# Patient Record
Sex: Female | Born: 1945 | Race: Asian | Hispanic: No | Smoking: Never smoker
Health system: Southern US, Community
[De-identification: ages and names within clinical notes are randomized; demographics above are authoritative.]

## PROBLEM LIST (undated history)

## (undated) DIAGNOSIS — M1990 Unspecified osteoarthritis, unspecified site: Secondary | ICD-10-CM

## (undated) DIAGNOSIS — I10 Essential (primary) hypertension: Secondary | ICD-10-CM

## (undated) DIAGNOSIS — A159 Respiratory tuberculosis unspecified: Secondary | ICD-10-CM

## (undated) DIAGNOSIS — E119 Type 2 diabetes mellitus without complications: Secondary | ICD-10-CM

## (undated) DIAGNOSIS — K219 Gastro-esophageal reflux disease without esophagitis: Secondary | ICD-10-CM

## (undated) HISTORY — PX: COLONOSCOPY: SHX174

## (undated) HISTORY — PX: ABDOMINAL HYSTERECTOMY: SHX81

## (undated) HISTORY — PX: NO PAST SURGERIES: SHX2092

---

## 2014-04-04 ENCOUNTER — Other Ambulatory Visit (INDEPENDENT_AMBULATORY_CARE_PROVIDER_SITE_OTHER): Payer: Self-pay | Admitting: Surgery

## 2014-04-25 ENCOUNTER — Encounter (HOSPITAL_COMMUNITY)
Admission: RE | Admit: 2014-04-25 | Discharge: 2014-04-25 | Disposition: A | Discharge status: Home / Self Care | Payer: Medicare Other | Source: Ambulatory Visit | Attending: Surgery | Admitting: Surgery

## 2014-04-25 ENCOUNTER — Encounter (HOSPITAL_COMMUNITY): Payer: Self-pay

## 2014-04-25 DIAGNOSIS — Z01818 Encounter for other preprocedural examination: Secondary | ICD-10-CM | POA: Diagnosis present

## 2014-04-25 HISTORY — DX: Essential (primary) hypertension: I10

## 2014-04-25 HISTORY — DX: Type 2 diabetes mellitus without complications: E11.9

## 2014-04-25 HISTORY — DX: Unspecified osteoarthritis, unspecified site: M19.90

## 2014-04-25 HISTORY — DX: Gastro-esophageal reflux disease without esophagitis: K21.9

## 2014-04-25 LAB — BASIC METABOLIC PANEL
ANION GAP: 13 (ref 5–15)
BUN: 19 mg/dL (ref 6–23)
CALCIUM: 9.7 mg/dL (ref 8.4–10.5)
CO2: 28 mEq/L (ref 19–32)
CREATININE: 0.61 mg/dL (ref 0.50–1.10)
Chloride: 99 mEq/L (ref 96–112)
Glucose, Bld: 148 mg/dL — ABNORMAL HIGH (ref 70–99)
Potassium: 4.4 mEq/L (ref 3.7–5.3)
SODIUM: 140 meq/L (ref 137–147)

## 2014-04-25 LAB — CBC
HEMATOCRIT: 33.6 % — AB (ref 36.0–46.0)
Hemoglobin: 10.8 g/dL — ABNORMAL LOW (ref 12.0–15.0)
MCH: 21.2 pg — ABNORMAL LOW (ref 26.0–34.0)
MCHC: 32.1 g/dL (ref 30.0–36.0)
MCV: 65.9 fL — ABNORMAL LOW (ref 78.0–100.0)
PLATELETS: 122 10*3/uL — AB (ref 150–400)
RBC: 5.1 MIL/uL (ref 3.87–5.11)
RDW: 15.5 % (ref 11.5–15.5)
WBC: 6.4 10*3/uL (ref 4.0–10.5)

## 2014-04-25 NOTE — Pre-Procedure Instructions (Addendum)
Olivia BuddMoihh Schranz  04/25/2014   Your procedure is scheduled on:  Wednesday, May 03, 2014 at 1:35 PM.   [facility] Ad[facility] Office at 11:30 AM.   Call this number if you have problems the morning of surgery: [phone]                Any questions prior to day of surgery, please call [phone] between 8 & 4 PM.   Remember:   Do not eat food or drink liquids after midnight.   Take these medicines the morning of surgery with A SIP OF WATER: pain med if needed Stop Aspirin, Nsaids, herbal meds, Vitamins,and herbal meds 5 days prior to surgery.  Do not wear jewelry, make-up or nail polish.  Do not wear lotions, powders, or perfumes. You may wear deodorant.  Do not shave 48 hours prior to surgery.   Do not bring valuables to the hospital.  [facility] not [facility]                  for any belongings or valuables.               Contacts, dentures or bridgework may not be worn into surgery.  Leave suitcase in the car. After surgery it may be brought to your room.  For patients admitted to the hospital, discharge time is determined by your                treatment team.               Patients discharged the day of surgery will not be allowed to drive home.    Special Instructions: [place] - Preparing for Surgery  Before surgery, you can play an important role.  Because skin is not sterile, your skin needs to be as free of germs as possible.  You can reduce the number of germs on you skin by washing with CHG (chlorahexidine gluconate) soap before surgery.  CHG is an antiseptic cleaner which kills germs and bonds with the skin to continue killing germs even after washing.  Please DO NOT use if you have an allergy to CHG or antibacterial soaps.  If your skin becomes reddened/irritated stop using the CHG and inform your nurse when you arrive at Short Stay.  Do not shave (including legs and underarms) for at least 48 hours prior to the first CHG shower.   You may shave your face.  Please follow these instructions carefully:   1.  Shower with CHG Soap the night before surgery and the                                morning of Surgery.  2.  If you choose to wash your hair, wash your hair first as usual with your       normal shampoo.  3.  After you shampoo, rinse your hair and body thoroughly to remove the                      Shampoo.  4.  Use CHG as you would any other liquid soap.  You can apply chg directly       to the skin and wash gently with scrungie or a clean washcloth.  5.  Apply the CHG Soap to your body ONLY FROM THE NECK DOWN.        Do  not use on open wounds or open sores.  Avoid contact with your eyes, ears, mouth and genitals (private parts).  Wash genitals (private parts) with your normal soap.  6.  Wash thoroughly, paying special attention to the area where your surgery        will be performed.  7.  Thoroughly rinse your body with warm water from the neck down.  8.  DO NOT shower/wash with your normal soap after using and rinsing off       the CHG Soap.  9.  Pat yourself dry with a clean towel.            10.  Wear clean pajamas.            11.  Place clean sheets on your bed the night of your first shower and do not        sleep with pets.  Day of Surgery  Do not apply any lotions the morning of surgery.  Please wear clean clothes to the hospital.     Please read over the following fact sheets that you were given: Pain Booklet, Coughing and Deep Breathing and Surgical Site Infection Prevention

## 2014-05-01 MED ORDER — CEFAZOLIN SODIUM-DEXTROSE 2-3 GM-% IV SOLR
2.0000 g | INTRAVENOUS | Status: AC
  Administered 2014-05-02: 2 g via INTRAVENOUS
  Filled 2014-05-01: qty 50

## 2014-05-01 NOTE — H&P (Signed)
Olivia Williamson 04/04/2014 3:08 PM Location: [facility] Patient #: [id number] DOB: [birth year] Widowed / Language: Undefined / Race: Undefined Female  History of Present Illness (Lawerence Dery A. Magnus IvanBlackman MD; 04/04/2014 3:25 PM) Patient words: gallstones.  The patient is a 68 year old female who presents for evaluation of gall stones. she is referred by Dr. Inda Cokeuong Nguyen in [place] for symptomatic cholelithiasis. She is accompanied by her granddaughter who is acting as her interpreter. She is [place] and speaks no [place]. she comes with an ultrasound from [place] dated July 2014 showing gallstones. She has been having intermittent abdominal pain in the epigastrium for some time. She now hurts daily. She has no nausea or vomiting. The history is poor secondary to language barrier   Other Problems Gilmer Mor(Sonya Bynum, CMA; 04/04/2014 3:09 PM) Cholelithiasis Diabetes Mellitus High blood pressure  Past Surgical History Gilmer Mor(Sonya Bynum, CMA; 04/04/2014 3:09 PM) No pertinent past surgical history  Diagnostic Studies History Gilmer Mor(Sonya Bynum, CMA; 04/04/2014 3:09 PM) Colonoscopy never Mammogram never Pap Smear never  Allergies Lamar Laundry(Sonya Bynum, CMA; 04/04/2014 3:08 PM) No Known Drug Allergies11/03/2014  Medication History (Sonya Bynum, CMA; 04/04/2014 3:11 PM) Nitrofurantoin Macrocrystal (100MG  Capsule, Oral) Active. MetFORMIN HCl (500MG  Tablet, Oral) Active. Gabapentin (300MG  Capsule, Oral) Active. GlyBURIDE (5MG  Tablet, Oral) Active.  Social History Gilmer Mor(Sonya Bynum, CMA; 04/04/2014 3:09 PM) Caffeine use Coffee. No alcohol use No drug use Tobacco use Never smoker.  Family History Gilmer Mor(Sonya Bynum, CMA; 04/04/2014 3:09 PM) Diabetes Mellitus Mother. Hypertension Mother.  Pregnancy / Birth History Gilmer Mor(Sonya Bynum, CMA; 04/04/2014 3:09 PM) Age at menarche 16 years. Age of menopause 7346-50 Gravida 1910 Maternal age 66-20 Para 2310  Review of Systems Lamar Laundry(Sonya Bynum CMA;  04/04/2014 3:09 PM) General Present- Appetite Loss, Fatigue, Fever and Weight Loss. Not Present- Chills, Night Sweats and Weight Gain. Skin Not Present- Change in Wart/Mole, Dryness, Hives, Jaundice, New Lesions, Non-Healing Wounds, Rash and Ulcer. HEENT Present- Hearing Loss and Seasonal Allergies. Not Present- Earache, Hoarseness, Nose Bleed, Oral Ulcers, Ringing in the Ears, Sinus Pain, Sore Throat, Visual Disturbances, Wears glasses/contact lenses and Yellow Eyes. Respiratory Not Present- Bloody sputum, Chronic Cough, Difficulty Breathing, Snoring and Wheezing. Breast Not Present- Breast Mass, Breast Pain, Nipple Discharge and Skin Changes. Cardiovascular Present- Leg Cramps. Not Present- Chest Pain, Difficulty Breathing Lying Down, Palpitations, Rapid Heart Rate, Shortness of Breath and Swelling of Extremities. Gastrointestinal Present- Abdominal Pain. Not Present- Bloating, Bloody Stool, Change in Bowel Habits, Chronic diarrhea, Constipation, Difficulty Swallowing, Excessive gas, Gets full quickly at meals, Hemorrhoids, Indigestion, Nausea, Rectal Pain and Vomiting. Female Genitourinary Present- Nocturia and Painful Urination. Not Present- Frequency, Pelvic Pain and Urgency. Musculoskeletal Not Present- Back Pain, Joint Pain, Joint Stiffness, Muscle Pain, Muscle Weakness and Swelling of Extremities. Neurological Present- Decreased Memory, Headaches and Weakness. Not Present- Fainting, Numbness, Seizures, Tingling, Tremor and Trouble walking. Psychiatric Present- Anxiety, Depression and Frequent crying. Not Present- Bipolar, Change in Sleep Pattern and Fearful. Endocrine Not Present- Cold Intolerance, Excessive Hunger, Hair Changes, Heat Intolerance, Hot flashes and New Diabetes. Hematology Not Present- Easy Bruising, Excessive bleeding, Gland problems, HIV and Persistent Infections.   Vitals (Sonya Bynum CMA; 04/04/2014 3:10 PM) 04/04/2014 3:09 PM Weight: 86 lb Height: 62in Body Surface  Area: 1.31 m Body Mass Index: 15.73 kg/m Temp.: 65F(Temporal)  Pulse: 76 (Regular)  BP: 124/70 (Sitting, Left Arm, Standard)    Physical Exam (Lataisha Colan A. Magnus IvanBlackman MD; 04/04/2014 3:25 PM) General Mental Status-Alert. General Appearance-Consistent with stated age. Hydration-Well hydrated. Voice-Normal.  Head and Neck Head-normocephalic, atraumatic with no  lesions or palpable masses.  Eye Eyeball - Bilateral-Extraocular movements intact. Sclera/Conjunctiva - Bilateral-No scleral icterus.  Chest and Lung Exam Chest and lung exam reveals -quiet, even and easy respiratory effort with no use of accessory muscles and on auscultation, normal breath sounds, no adventitious sounds and normal vocal resonance. Inspection Chest Wall - Normal. Back - normal.  Cardiovascular Cardiovascular examination reveals -on palpation PMI is normal in location and amplitude, no palpable S3 or S4. Normal cardiac borders., normal heart sounds, regular rate and rhythm with no murmurs, carotid auscultation reveals no bruits and normal pedal pulses bilaterally.  Abdomen Inspection Inspection of the abdomen reveals - No Hernias. Skin - Scar - no surgical scars. Palpation/Percussion Palpation and Percussion of the abdomen reveal - Soft, Non Tender, No Rebound tenderness, No Rigidity (guarding) and No hepatosplenomegaly. Auscultation Auscultation of the abdomen reveals - Bowel sounds normal.  Neurologic Neurologic evaluation reveals -alert and oriented x 3 with no impairment of recent or remote memory. Mental Status-Normal.  Musculoskeletal Normal Exam - Left-Upper Extremity Strength Normal and Lower Extremity Strength Normal. Normal Exam - Right-Upper Extremity Strength Normal, Lower Extremity Weakness.    Assessment & Plan (Mabry Santarelli A. Magnus IvanBlackman MD; 04/04/2014 3:26 PM) SYMPTOMATIC CHOLELITHIASIS (574.20  K80.20) Impression: laparoscopic cholecystectomy with  cholangiogram is recommended. I discussed the surgery with them and gave them literature regarding surgery. I discussed the risks with them. They wish to proceed Current Plans  Started Hydrocodone-Acetaminophen 5-325MG , 1 (one) Tablet every four hours, as needed, #30, 04/04/2014, No Refill.

## 2014-05-02 ENCOUNTER — Encounter (HOSPITAL_COMMUNITY): Payer: Self-pay | Admitting: *Deleted

## 2014-05-02 ENCOUNTER — Encounter (HOSPITAL_COMMUNITY)
Admission: RE | Disposition: A | Discharge status: Home / Self Care | Payer: Self-pay | Source: Ambulatory Visit | Attending: Surgery

## 2014-05-02 ENCOUNTER — Ambulatory Visit (HOSPITAL_COMMUNITY): Payer: Medicare Other | Admitting: Certified Registered Nurse Anesthetist

## 2014-05-02 ENCOUNTER — Ambulatory Visit (HOSPITAL_COMMUNITY)
Admission: RE | Admit: 2014-05-02 | Discharge: 2014-05-02 | Disposition: A | Discharge status: Home / Self Care | Payer: Medicare Other | Source: Ambulatory Visit | Attending: Surgery | Admitting: Surgery

## 2014-05-02 DIAGNOSIS — Z8249 Family history of ischemic heart disease and other diseases of the circulatory system: Secondary | ICD-10-CM | POA: Insufficient documentation

## 2014-05-02 DIAGNOSIS — K8020 Calculus of gallbladder without cholecystitis without obstruction: Secondary | ICD-10-CM | POA: Diagnosis present

## 2014-05-02 DIAGNOSIS — Z833 Family history of diabetes mellitus: Secondary | ICD-10-CM | POA: Insufficient documentation

## 2014-05-02 DIAGNOSIS — I10 Essential (primary) hypertension: Secondary | ICD-10-CM | POA: Insufficient documentation

## 2014-05-02 DIAGNOSIS — K8010 Calculus of gallbladder with chronic cholecystitis without obstruction: Secondary | ICD-10-CM | POA: Insufficient documentation

## 2014-05-02 DIAGNOSIS — E119 Type 2 diabetes mellitus without complications: Secondary | ICD-10-CM | POA: Insufficient documentation

## 2014-05-02 HISTORY — PX: CHOLECYSTECTOMY: SHX55

## 2014-05-02 LAB — GLUCOSE, CAPILLARY
GLUCOSE-CAPILLARY: 218 mg/dL — AB (ref 70–99)
GLUCOSE-CAPILLARY: 250 mg/dL — AB (ref 70–99)
GLUCOSE-CAPILLARY: 283 mg/dL — AB (ref 70–99)
GLUCOSE-CAPILLARY: 261 mg/dL — AB (ref 70–99)
Glucose-Capillary: 237 mg/dL — ABNORMAL HIGH (ref 70–99)

## 2014-05-02 SURGERY — LAPAROSCOPIC CHOLECYSTECTOMY WITH INTRAOPERATIVE CHOLANGIOGRAM
Anesthesia: General | Site: Abdomen

## 2014-05-02 MED ORDER — LACTATED RINGERS IV SOLN
INTRAVENOUS | Status: DC | PRN
  Administered 2014-05-02: 12:00:00 via INTRAVENOUS

## 2014-05-02 MED ORDER — INSULIN ASPART 100 UNIT/ML ~~LOC~~ SOLN
6.0000 [IU] | Freq: Once | SUBCUTANEOUS | Status: AC
  Administered 2014-05-02: 6 [IU] via INTRAVENOUS

## 2014-05-02 MED ORDER — MIDAZOLAM HCL 2 MG/2ML IJ SOLN
INTRAMUSCULAR | Status: AC
  Filled 2014-05-02: qty 2

## 2014-05-02 MED ORDER — INSULIN ASPART 100 UNIT/ML ~~LOC~~ SOLN
SUBCUTANEOUS | Status: AC
  Filled 2014-05-02: qty 6

## 2014-05-02 MED ORDER — HYDROCODONE-ACETAMINOPHEN 5-325 MG PO TABS
ORAL_TABLET | ORAL | Status: AC
  Filled 2014-05-02: qty 1

## 2014-05-02 MED ORDER — INSULIN ASPART 100 UNIT/ML ~~LOC~~ SOLN
8.0000 [IU] | Freq: Once | SUBCUTANEOUS | Status: AC
  Administered 2014-05-02: 8 [IU] via SUBCUTANEOUS

## 2014-05-02 MED ORDER — FENTANYL CITRATE 0.05 MG/ML IJ SOLN
INTRAMUSCULAR | Status: AC
  Filled 2014-05-02: qty 2

## 2014-05-02 MED ORDER — PROPOFOL 10 MG/ML IV BOLUS
INTRAVENOUS | Status: DC | PRN
  Administered 2014-05-02: 100 mg via INTRAVENOUS
  Administered 2014-05-02: 50 mg via INTRAVENOUS

## 2014-05-02 MED ORDER — SODIUM CHLORIDE 0.9 % IR SOLN
Status: DC | PRN
  Administered 2014-05-02: 1000 mL

## 2014-05-02 MED ORDER — BUPIVACAINE-EPINEPHRINE 0.25% -1:200000 IJ SOLN
INTRAMUSCULAR | Status: DC | PRN
  Administered 2014-05-02: 30 mL

## 2014-05-02 MED ORDER — MIDAZOLAM HCL 5 MG/5ML IJ SOLN
INTRAMUSCULAR | Status: DC | PRN
  Administered 2014-05-02: 1 mg via INTRAVENOUS

## 2014-05-02 MED ORDER — FENTANYL CITRATE 0.05 MG/ML IJ SOLN
INTRAMUSCULAR | Status: AC
  Filled 2014-05-02: qty 5

## 2014-05-02 MED ORDER — PROPOFOL 10 MG/ML IV BOLUS
INTRAVENOUS | Status: AC
  Filled 2014-05-02: qty 20

## 2014-05-02 MED ORDER — INSULIN ASPART 100 UNIT/ML ~~LOC~~ SOLN
SUBCUTANEOUS | Status: AC
  Filled 2014-05-02: qty 1

## 2014-05-02 MED ORDER — FENTANYL CITRATE 0.05 MG/ML IJ SOLN
25.0000 ug | INTRAMUSCULAR | Status: DC | PRN
  Administered 2014-05-02 (×5): 25 ug via INTRAVENOUS

## 2014-05-02 MED ORDER — OXYCODONE HCL 5 MG/5ML PO SOLN: 5.0000 mg | Freq: Once | ORAL | Status: DC | PRN

## 2014-05-02 MED ORDER — LIDOCAINE HCL (CARDIAC) 20 MG/ML IV SOLN
INTRAVENOUS | Status: AC
Start: 2014-05-02 — End: 2014-05-02
  Filled 2014-05-02: qty 5

## 2014-05-02 MED ORDER — 0.9 % SODIUM CHLORIDE (POUR BTL) OPTIME
TOPICAL | Status: DC | PRN
  Administered 2014-05-02: 1000 mL

## 2014-05-02 MED ORDER — HYDROCODONE-ACETAMINOPHEN 5-325 MG PO TABS
1.0000 | ORAL_TABLET | Freq: Once | ORAL | Status: AC
  Administered 2014-05-02: 1 via ORAL

## 2014-05-02 MED ORDER — FENTANYL CITRATE 0.05 MG/ML IJ SOLN
INTRAMUSCULAR | Status: DC | PRN
  Administered 2014-05-02 (×2): 50 ug via INTRAVENOUS

## 2014-05-02 MED ORDER — LIDOCAINE HCL (CARDIAC) 20 MG/ML IV SOLN
INTRAVENOUS | Status: DC | PRN
  Administered 2014-05-02: 50 mg via INTRAVENOUS

## 2014-05-02 MED ORDER — OXYCODONE HCL 5 MG PO TABS: 5.0000 mg | ORAL_TABLET | Freq: Once | ORAL | Status: DC | PRN

## 2014-05-02 MED ORDER — HYDROCODONE-ACETAMINOPHEN 5-325 MG PO TABS: 1.0000 | ORAL_TABLET | ORAL | Status: DC | PRN

## 2014-05-02 MED ORDER — ONDANSETRON HCL 4 MG/2ML IJ SOLN
INTRAMUSCULAR | Status: DC | PRN
  Administered 2014-05-02: 4 mg via INTRAVENOUS

## 2014-05-02 MED ORDER — SUCCINYLCHOLINE CHLORIDE 20 MG/ML IJ SOLN
INTRAMUSCULAR | Status: DC | PRN
  Administered 2014-05-02: 100 mg via INTRAVENOUS

## 2014-05-02 SURGICAL SUPPLY — 39 items
APPLIER CLIP 5 13 M/L LIGAMAX5 (MISCELLANEOUS) ×3
BANDAGE ADH SHEER 1  50/CT (GAUZE/BANDAGES/DRESSINGS) ×3 IMPLANT
BENZOIN TINCTURE PRP APPL 2/3 (GAUZE/BANDAGES/DRESSINGS) ×3 IMPLANT
BLADE SURG CLIPPER 3M 9600 (MISCELLANEOUS) IMPLANT
CANISTER SUCTION 2500CC (MISCELLANEOUS) ×3 IMPLANT
CHLORAPREP W/TINT 26ML (MISCELLANEOUS) ×3 IMPLANT
CLIP APPLIE 5 13 M/L LIGAMAX5 (MISCELLANEOUS) ×1 IMPLANT
CLOSURE WOUND 1/2 X4 (GAUZE/BANDAGES/DRESSINGS) ×1
COVER MAYO STAND STRL (DRAPES) IMPLANT
COVER SURGICAL LIGHT HANDLE (MISCELLANEOUS) ×3 IMPLANT
DRAPE LAPAROSCOPIC ABDOMINAL (DRAPES) ×3 IMPLANT
ELECT REM PT RETURN 9FT ADLT (ELECTROSURGICAL) ×3
ELECTRODE REM PT RTRN 9FT ADLT (ELECTROSURGICAL) ×1 IMPLANT
GLOVE BIOGEL PI IND STRL 7.0 (GLOVE) ×1 IMPLANT
GLOVE BIOGEL PI IND STRL 7.5 (GLOVE) ×1 IMPLANT
GLOVE BIOGEL PI INDICATOR 7.0 (GLOVE) ×2
GLOVE BIOGEL PI INDICATOR 7.5 (GLOVE) ×2
GLOVE SURG SIGNA 7.5 PF LTX (GLOVE) ×3 IMPLANT
GLOVE SURG SS PI 7.0 STRL IVOR (GLOVE) ×6 IMPLANT
GOWN STRL REUS W/ TWL LRG LVL3 (GOWN DISPOSABLE) ×2 IMPLANT
GOWN STRL REUS W/ TWL XL LVL3 (GOWN DISPOSABLE) ×1 IMPLANT
GOWN STRL REUS W/TWL LRG LVL3 (GOWN DISPOSABLE) ×4
GOWN STRL REUS W/TWL XL LVL3 (GOWN DISPOSABLE) ×2
KIT BASIN OR (CUSTOM PROCEDURE TRAY) ×3 IMPLANT
KIT ROOM TURNOVER OR (KITS) ×3 IMPLANT
NS IRRIG 1000ML POUR BTL (IV SOLUTION) ×3 IMPLANT
PAD ARMBOARD 7.5X6 YLW CONV (MISCELLANEOUS) ×3 IMPLANT
POUCH SPECIMEN RETRIEVAL 10MM (ENDOMECHANICALS) ×3 IMPLANT
SCISSORS LAP 5X35 DISP (ENDOMECHANICALS) ×3 IMPLANT
SET IRRIG TUBING LAPAROSCOPIC (IRRIGATION / IRRIGATOR) ×3 IMPLANT
SLEEVE ENDOPATH XCEL 5M (ENDOMECHANICALS) ×6 IMPLANT
SPECIMEN JAR SMALL (MISCELLANEOUS) ×3 IMPLANT
STRIP CLOSURE SKIN 1/2X4 (GAUZE/BANDAGES/DRESSINGS) ×2 IMPLANT
SUT MON AB 4-0 PC3 18 (SUTURE) ×3 IMPLANT
TOWEL OR 17X24 6PK STRL BLUE (TOWEL DISPOSABLE) ×3 IMPLANT
TRAY LAPAROSCOPIC (CUSTOM PROCEDURE TRAY) ×3 IMPLANT
TROCAR XCEL BLUNT TIP 100MML (ENDOMECHANICALS) ×3 IMPLANT
TROCAR XCEL NON-BLD 5MMX100MML (ENDOMECHANICALS) ×3 IMPLANT
TUBING INSUFFLATION (TUBING) ×3 IMPLANT

## 2014-05-02 NOTE — Anesthesia Procedure Notes (Signed)
Procedure Name: Intubation Date/Time: 05/02/2014 12:44 PM Performed by: Caren MacadamARTER, Steed Kanaan W Pre-anesthesia Checklist: Patient identified, Emergency Drugs available, Suction available and Patient being monitored Patient Re-evaluated:Patient Re-evaluated prior to inductionOxygen Delivery Method: Circle System Utilized Preoxygenation: Pre-oxygenation with 100% oxygen Intubation Type: IV induction Ventilation: Mask ventilation without difficulty Laryngoscope Size: Miller and 2 Tube type: Oral Tube size: 7.0 mm Number of attempts: 1 Airway Equipment and Method: stylet and oral airway Placement Confirmation: ETT inserted through vocal cords under direct vision,  positive ETCO2 and breath sounds checked- equal and bilateral Secured at: 21 cm Tube secured with: Tape Dental Injury: Teeth and Oropharynx as per pre-operative assessment

## 2014-05-02 NOTE — Interval H&P Note (Signed)
History and Physical Interval Note: no change in H and P  05/02/2014 11:54 AM  Olivia Williamson  has presented today for surgery, with the diagnosis of Cholelithiasis  The various methods of treatment have been discussed with the patient and family. After consideration of risks, benefits and other options for treatment, the patient has consented to  Procedure(s): LAPAROSCOPIC CHOLECYSTECTOMY WITH INTRAOPERATIVE CHOLANGIOGRAM (N/A) as a surgical intervention .  The patient's history has been reviewed, patient examined, no change in status, stable for surgery.  I have reviewed the patient's chart and labs.  Questions were answered to the patient's satisfaction.     Hoa Deriso A

## 2014-05-02 NOTE — Op Note (Signed)
Laparoscopic Cholecystectomy Procedure Note  Indications: This patient presents with symptomatic gallbladder disease and will undergo laparoscopic cholecystectomy.  Pre-operative Diagnosis: Calculus of gallbladder without mention of cholecystitis or obstruction  Post-operative Diagnosis: Same  Surgeon: Abigail MiyamotoBLACKMAN,Brianna Bennett A   Assistants: 0  Anesthesia: General endotracheal anesthesia  ASA Class: 2  Procedure Details  The patient was seen again in the Holding Room. The risks, benefits, complications, treatment options, and expected outcomes were discussed with the patient. The possibilities of reaction to medication, pulmonary aspiration, perforation of viscus, bleeding, recurrent infection, finding a normal gallbladder, the need for additional procedures, failure to diagnose a condition, the possible need to convert to an open procedure, and creating a complication requiring transfusion or operation were discussed with the patient. The likelihood of improving the patient's symptoms with return to their baseline status is good.  The patient and/or family concurred with the proposed plan, giving informed consent. The site of surgery properly noted. The patient was taken to Operating Room, identified as Olivia Williamson and the procedure verified as Laparoscopic Cholecystectomy with Intraoperative Cholangiogram. A Time Out was held and the above information confirmed.  Prior to the induction of general anesthesia, antibiotic prophylaxis was administered. General endotracheal anesthesia was then administered and tolerated well. After the induction, the abdomen was prepped with Chloraprep and draped in sterile fashion. The patient was positioned in the supine position.  Local anesthetic agent was injected into the skin near the umbilicus and an incision made. We dissected down to the abdominal fascia with blunt dissection.  The fascia was incised vertically and we entered the peritoneal cavity bluntly.  A  pursestring suture of 0-Vicryl was placed around the fascial opening.  The Hasson cannula was inserted and secured with the stay suture.  Pneumoperitoneum was then created with CO2 and tolerated well without any adverse changes in the patient's vital signs. An 11-mm port was placed in the subxiphoid position.  Two 5-mm ports were placed in the right upper quadrant. All skin incisions were infiltrated with a local anesthetic agent before making the incision and placing the trocars.   We positioned the patient in reverse Trendelenburg, tilted slightly to the patient's left.  The gallbladder was identified, the fundus grasped and retracted cephalad. Adhesions were lysed bluntly and with the electrocautery where indicated, taking care not to injure any adjacent organs or viscus. The infundibulum was grasped and retracted laterally, exposing the peritoneum overlying the triangle of Calot. This was then divided and exposed in a blunt fashion. The cystic duct was clearly identified and bluntly dissected circumferentially. A critical view of the cystic duct and cystic artery was obtained.  The cystic duct was then ligated with clips and divided. The cystic artery was, dissected free, ligated with clips and divided as well.   The gallbladder was dissected from the liver bed in retrograde fashion with the electrocautery. The gallbladder was removed and placed in an Endocatch sac. The liver bed was irrigated and inspected. Hemostasis was achieved with the electrocautery. Copious irrigation was utilized and was repeatedly aspirated until clear.  The gallbladder and Endocatch sac were then removed through the umbilical port site.  The pursestring suture was used to close the umbilical fascia.    We again inspected the right upper quadrant for hemostasis.  Pneumoperitoneum was released as we removed the trocars.  4-0 Monocryl was used to close the skin.   Benzoin, steri-strips, and clean dressings were applied. The patient  was then extubated and brought to the recovery room in  stable condition. Instrument, sponge, and needle counts were correct at closure and at the conclusion of the case.   Findings: Cholecystitis with Cholelithiasis  Estimated Blood Loss: Minimal         Drains: 0         Specimens: Gallbladder           Complications: None; patient tolerated the procedure well.         Disposition: PACU - hemodynamically stable.         Condition: stable

## 2014-05-02 NOTE — Anesthesia Postprocedure Evaluation (Signed)
  Anesthesia Post-op Note  Patient: Olivia Williamson  Procedure(s) Performed: Procedure(s) with comments: LAPAROSCOPIC CHOLECYSTECTOMY  (N/A) - laparoscopic cholecystectomy  Patient Location: PACU  Anesthesia Type:General  Level of Consciousness: awake and alert   Airway and Oxygen Therapy: Patient Spontanous Breathing  Post-op Pain: mild  Post-op Assessment: Post-op Vital signs reviewed and Patient's Cardiovascular Status Stable  Post-op Vital Signs: Reviewed  Last Vitals:  Filed Vitals:   05/02/14 1350  BP: 139/53  Pulse: 77  Temp:   Resp: 16    Complications: No apparent anesthesia complications

## 2014-05-02 NOTE — Anesthesia Preprocedure Evaluation (Addendum)
Anesthesia Evaluation  Patient identified by MRN, date of birth, ID band Patient awake    Reviewed: Allergy & Precautions, H&P , NPO status , reviewed documented beta blocker date and time   Airway Mallampati: II   Neck ROM: Full    Dental  (+) Teeth Intact   Pulmonary  breath sounds clear to auscultation        Cardiovascular hypertension, Pt. on medications Rhythm:Regular     Neuro/Psych    GI/Hepatic   Endo/Other  diabetes, Type 2  Renal/GU      Musculoskeletal   Abdominal (+)  Abdomen: soft.    Peds  Hematology   Anesthesia Other Findings   Reproductive/Obstetrics                           Anesthesia Physical Anesthesia Plan  ASA: II  Anesthesia Plan: General   Post-op Pain Management:    Induction: Intravenous  Airway Management Planned: Oral ETT  Additional Equipment:   Intra-op Plan:   Post-operative Plan: Extubation in OR  Informed Consent: I have reviewed the patients History and Physical, chart, labs and discussed the procedure including the risks, benefits and alternatives for the proposed anesthesia with the patient or authorized representative who has indicated his/her understanding and acceptance.     Plan Discussed with:   Anesthesia Plan Comments: (Used translator for hx)        Anesthesia Quick Evaluation

## 2014-05-02 NOTE — Transfer of Care (Signed)
Immediate Anesthesia Transfer of Care Note  Patient: Olivia Williamson  Procedure(s) Performed: Procedure(s) with comments: LAPAROSCOPIC CHOLECYSTECTOMY  (N/A) - laparoscopic cholecystectomy  Patient Location: PACU  Anesthesia Type:General  Level of Consciousness: awake, alert  and patient cooperative  Airway & Oxygen Therapy: Patient Spontanous Breathing and Patient connected to nasal cannula oxygen  Post-op Assessment: Report given to PACU RN, Post -op Vital signs reviewed and stable and Patient moving all extremities X 4  Post vital signs: Reviewed and stable  Complications: Dr. Berneice HeinrichManny to bedside to assess loose tooth.

## 2014-05-02 NOTE — Discharge Instructions (Signed)
[facility] ______[facility] °LAPAROSCOPIC SURGERY: POST OP INSTRUCTIONS °Always review your discharge instruction sheet given to you by the facility where your surgery was performed. °IF YOU HAVE DISABILITY OR FAMILY LEAVE FORMS, YOU MUST BRING THEM TO THE OFFICE FOR PROCESSING.   °DO NOT GIVE THEM TO YOUR DOCTOR. ° °1. A prescription for pain medication may be given to you upon discharge.  Take your pain medication as prescribed, if needed.  If narcotic pain medicine is not needed, then you may take acetaminophen (Tylenol) or ibuprofen (Advil) as needed. °2. Take your usually prescribed medications unless otherwise directed. °3. If you need a refill on your pain medication, please contact your pharmacy.  They will contact our office to request authorization. Prescriptions will not be filled after 5pm or on week-ends. °4. You should follow a light diet the first few days after arrival home, such as soup and crackers, etc.  Be sure to include lots of fluids daily. °5. Most patients will experience some swelling and bruising in the area of the incisions.  Ice packs will help.  Swelling and bruising can take several days to resolve.  °6. It is common to experience some constipation if taking pain medication after surgery.  Increasing fluid intake and taking a stool softener (such as Colace) will usually help or prevent this problem from occurring.  A mild laxative (Milk of Magnesia or Miralax) should be taken according to package instructions if there are no bowel movements after 48 hours. °7. Unless discharge instructions indicate otherwise, you may remove your bandages 24-48 hours after surgery, and you may shower at that time.  You may have steri-strips (small skin tapes) in place directly over the incision.  These strips should be left on the skin for 7-10 days.  If your surgeon used skin glue on the incision, you may shower in 24 hours.  The glue will flake off over the next 2-3 weeks.  Any sutures or  staples will be removed at the office during your follow-up visit. °8. ACTIVITIES:  You may resume regular (light) daily activities beginning the next day--such as daily self-care, walking, climbing stairs--gradually increasing activities as tolerated.  You may have sexual intercourse when it is comfortable.  Refrain from any heavy lifting or straining until approved by your doctor. °a. You may drive when you are no longer taking prescription pain medication, you can comfortably wear a seatbelt, and you can safely maneuver your car and apply brakes. °b. RETURN TO WORK:  __________________________________________________________ °9. You should see your doctor in the office for a follow-up appointment approximately 2-3 weeks after your surgery.  Make sure that you call for this appointment within a day or two after you arrive home to insure a convenient appointment time. °10. OTHER INSTRUCTIONS: __________________________________________________________________________________________________________________________ __________________________________________________________________________________________________________________________ °WHEN TO CALL YOUR DOCTOR: °1. Fever over 101.0 °2. Inability to urinate °3. Continued bleeding from incision. °4. Increased pain, redness, or drainage from the incision. °5. Increasing abdominal pain ° °The clinic staff is available to answer your questions during regular business hours.  Please don’t hesitate to call and ask to speak to one of the nurses for clinical concerns.  If you have a medical emergency, go to the nearest emergency room or call 911.  A surgeon from [facility] is always on call at the hospital. °[address], [address], [place], [place]  [address] ? [address] [address], [place], [place]   [address] °[phone] ? [phone] ? FAX [phone] °Web site:   [email or web address] ° °What to eat: ° °For your first meals, you should eat  lightly; only small meals initially.  If you do not have nausea, you may eat larger meals.  Avoid spicy, greasy and heavy food.   ° °General Anesthesia, Adult, Care After  °Refer to this sheet in the next few weeks. These instructions provide you with information on caring for yourself after your procedure. Your health care provider may also give you more specific instructions. Your treatment has been planned according to current medical practices, but problems sometimes occur. Call your health care provider if you have any problems or questions after your procedure.  °WHAT TO EXPECT AFTER THE PROCEDURE  °After the procedure, it is typical to experience:  °Sleepiness.  °Nausea and vomiting. °HOME CARE INSTRUCTIONS  °For the first 24 hours after general anesthesia:  °Have a responsible person with you.  °Do not drive a car. If you are alone, do not take public transportation.  °Do not drink alcohol.  °Do not take medicine that has not been prescribed by your health care provider.  °Do not sign important papers or make important decisions.  °You may resume a normal diet and activities as directed by your health care provider.  °Change bandages (dressings) as directed.  °If you have questions or problems that seem related to general anesthesia, call the hospital and ask for the anesthetist or anesthesiologist on call. °SEEK MEDICAL CARE IF:  °You have nausea and vomiting that continue the day after anesthesia.  °You develop a rash. °SEEK IMMEDIATE MEDICAL CARE IF:  °You have difficulty breathing.  °You have chest pain.  °You have any allergic problems. °Document Released: 08/17/2000 Document Revised: 01/11/2013 Document Reviewed: 11/24/2012  °ExitCare® Patient Information ©2014 [employer].  ° ° °

## 2014-05-03 ENCOUNTER — Encounter (HOSPITAL_COMMUNITY): Payer: Self-pay | Admitting: Emergency Medicine

## 2014-05-03 ENCOUNTER — Emergency Department (HOSPITAL_COMMUNITY)
Admission: EM | Admit: 2014-05-03 | Discharge: 2014-05-04 | Disposition: A | Discharge status: Home / Self Care | Payer: Medicare Other | Attending: Emergency Medicine | Admitting: Emergency Medicine

## 2014-05-03 DIAGNOSIS — D72829 Elevated white blood cell count, unspecified: Secondary | ICD-10-CM | POA: Diagnosis not present

## 2014-05-03 DIAGNOSIS — R630 Anorexia: Secondary | ICD-10-CM | POA: Insufficient documentation

## 2014-05-03 DIAGNOSIS — Z9889 Other specified postprocedural states: Secondary | ICD-10-CM | POA: Insufficient documentation

## 2014-05-03 DIAGNOSIS — G8918 Other acute postprocedural pain: Secondary | ICD-10-CM | POA: Diagnosis not present

## 2014-05-03 DIAGNOSIS — I10 Essential (primary) hypertension: Secondary | ICD-10-CM | POA: Diagnosis not present

## 2014-05-03 DIAGNOSIS — Z9049 Acquired absence of other specified parts of digestive tract: Secondary | ICD-10-CM | POA: Insufficient documentation

## 2014-05-03 DIAGNOSIS — R7401 Elevation of levels of liver transaminase levels: Secondary | ICD-10-CM

## 2014-05-03 DIAGNOSIS — M1990 Unspecified osteoarthritis, unspecified site: Secondary | ICD-10-CM | POA: Diagnosis not present

## 2014-05-03 DIAGNOSIS — R112 Nausea with vomiting, unspecified: Secondary | ICD-10-CM | POA: Diagnosis not present

## 2014-05-03 DIAGNOSIS — E119 Type 2 diabetes mellitus without complications: Secondary | ICD-10-CM | POA: Diagnosis not present

## 2014-05-03 DIAGNOSIS — D696 Thrombocytopenia, unspecified: Secondary | ICD-10-CM | POA: Insufficient documentation

## 2014-05-03 DIAGNOSIS — R740 Nonspecific elevation of levels of transaminase and lactic acid dehydrogenase [LDH]: Secondary | ICD-10-CM

## 2014-05-03 DIAGNOSIS — R1011 Right upper quadrant pain: Secondary | ICD-10-CM | POA: Diagnosis present

## 2014-05-03 DIAGNOSIS — Z8719 Personal history of other diseases of the digestive system: Secondary | ICD-10-CM | POA: Diagnosis not present

## 2014-05-03 LAB — COMPREHENSIVE METABOLIC PANEL
ALT: 66 U/L — ABNORMAL HIGH (ref 0–35)
AST: 48 U/L — ABNORMAL HIGH (ref 0–37)
Albumin: 3.1 g/dL — ABNORMAL LOW (ref 3.5–5.2)
Alkaline Phosphatase: 68 U/L (ref 39–117)
Anion gap: 12 (ref 5–15)
BUN: 33 mg/dL — ABNORMAL HIGH (ref 6–23)
CO2: 27 mEq/L (ref 19–32)
Calcium: 9.1 mg/dL (ref 8.4–10.5)
Chloride: 93 mEq/L — ABNORMAL LOW (ref 96–112)
Creatinine, Ser: 0.72 mg/dL (ref 0.50–1.10)
GFR calc Af Amer: >90 mL/min (ref >90)
GFR calc non Af Amer: 88 mL/min — ABNORMAL LOW (ref >90)
Glucose, Bld: 262 mg/dL — ABNORMAL HIGH (ref 70–99)
Potassium: 4 mEq/L (ref 3.7–5.3)
Sodium: 132 mEq/L — ABNORMAL LOW (ref 137–147)
Total Bilirubin: 1.3 mg/dL — ABNORMAL HIGH (ref 0.3–1.2)
Total Protein: 6.8 g/dL (ref 6.0–8.3)

## 2014-05-03 LAB — LIPASE, BLOOD: Lipase: 13 U/L (ref 11–59)

## 2014-05-03 MED ORDER — ONDANSETRON HCL 4 MG/2ML IJ SOLN
4.0000 mg | Freq: Once | INTRAMUSCULAR | Status: AC
  Administered 2014-05-03: 4 mg via INTRAVENOUS
  Filled 2014-05-03: qty 2

## 2014-05-03 MED ORDER — HYDROMORPHONE HCL 1 MG/ML IJ SOLN
1.0000 mg | Freq: Once | INTRAMUSCULAR | Status: AC
  Administered 2014-05-03: 1 mg via INTRAVENOUS
  Filled 2014-05-03: qty 1

## 2014-05-03 MED ORDER — SODIUM CHLORIDE 0.9 % IV BOLUS (SEPSIS)
1000.0000 mL | Freq: Once | INTRAVENOUS | Status: AC
  Administered 2014-05-03: 1000 mL via INTRAVENOUS

## 2014-05-03 NOTE — ED Notes (Signed)
Patient presents with RUQ abdominal pain. Patient had laproscopic surgery yesterday for gallstones. Patient is guarding her RUQ. Patient reports nausea and emesis. Language barrier, patient speaks vietnamese. BP 127/86 HR 91 RR 28

## 2014-05-03 NOTE — ED Provider Notes (Signed)
CSN: [id number]     Arrival date & time 05/03/14  2229 History   First MD Initiated Contact with Patient 05/03/14 2303     Chief Complaint  Patient presents with  . Abdominal Pain     (Consider location/radiation/quality/duration/timing/severity/associated sxs/prior Treatment) HPI Pt is a 68yo female presenting to ED 1 day s/p laparoscopic surgery for cholecystomy by Dr. Magnus IvanBlackman, c/o severe diffuse abdominal pain that worsened this morning, unrelieved by pain norco provided to pt. Pt is vietnamese, non-English speaking, hx provided by family members.  Per family, pt had about 6 episodes of vomiting today, unable to keep down fluids.  Pain is constant worse in upper abdomen, worse with movement and in certain positions. Pt has c/o hot and cold chills, subjective fever.  Family is wondering if pt will be admitted for her pain.    Past Medical History  Diagnosis Date  . Hypertension     meds stopped 1 week ago , need to get renewed  . GERD (gastroesophageal reflux disease)   . Diabetes mellitus without complication     stopped DM meds 2004  . Arthritis    Past Surgical History  Procedure Laterality Date  . Colonoscopy    . No past surgeries     No family history on file. History  Substance Use Topics  . Smoking status: Never Smoker   . Smokeless tobacco: Never Used  . Alcohol Use: No   OB History    No data available     Review of Systems  Constitutional: Positive for fever ( subjective), chills and appetite change.  Gastrointestinal: Positive for nausea, vomiting and abdominal pain. Negative for diarrhea, constipation and blood in stool.  All other systems reviewed and are negative.     Allergies  Review of patient's allergies indicates no known allergies.  Home Medications   Prior to Admission medications   Medication Sig Start Date End Date Taking? Authorizing Provider  HYDROcodone-acetaminophen (NORCO/VICODIN) 5-325 MG per tablet Take 1 tablet by mouth every 4  (four) hours as needed (pain). 05/02/14  Yes Abigail Miyamotoouglas Blackman, MD   BP 120/63 mmHg  Pulse 93  Temp(Src) 98.1 F (36.7 C) (Oral)  Resp 24  SpO2 95% Physical Exam  Constitutional: She appears well-developed and well-nourished. She appears distressed.  Pt lying in exam bed moaning, holding RUQ. Appears in pain.  HENT:  Head: Normocephalic and atraumatic.  Eyes: Conjunctivae are normal. No scleral icterus.  Neck: Normal range of motion.  Cardiovascular: Normal rate, regular rhythm and normal heart sounds.   Pulmonary/Chest: Effort normal and breath sounds normal. No respiratory distress. She has no wheezes. She has no rales. She exhibits no tenderness.  Abdominal: Bowel sounds are normal. She exhibits no distension and no mass. There is tenderness. There is guarding. There is no rebound.  Multiple surgical incisions with dry clean bandages. no erythema or discharge. Abdomen is rigid but non-distended. Diffuse tenderness with guarding, worse in RUQ.    Musculoskeletal: Normal range of motion.  Neurological: She is alert.  Skin: Skin is warm and dry. She is not diaphoretic.  Nursing note and vitals reviewed.   ED Course  Procedures (including critical care time) Labs Review Labs Reviewed  CBC WITH DIFFERENTIAL - Abnormal; Notable for the following:    WBC 18.6 (*)    Hemoglobin 10.2 (*)    HCT 30.4 (*)    MCV 64.7 (*)    MCH 21.7 (*)    Platelets 95 (*)    Neutrophils Relative %  93 (*)    Lymphocytes Relative 4 (*)    Neutro Abs 17.3 (*)    All other components within normal limits  COMPREHENSIVE METABOLIC PANEL - Abnormal; Notable for the following:    Sodium 132 (*)    Chloride 93 (*)    Glucose, Bld 262 (*)    BUN 33 (*)    Albumin 3.1 (*)    AST 48 (*)    ALT 66 (*)    Total Bilirubin 1.3 (*)    GFR calc non Af Amer 88 (*)    All other components within normal limits  LIPASE, BLOOD    Imaging Review No results found.   EKG Interpretation None      MDM    Final diagnoses:  Post-op pain    Pt is a 67yo female 1 day s/p laparoscopic cholecystectomy.  Pt is significantly tender on exam. Reports nausea and vomiting.  Pt is afebrile.  Leukocytosis noted on labs, unknown significance due to s/p 1 day post-op. Discussed pt with Dr. Preston FleetingGlick who also examined, pt. Will get CT abdomen, concern for surgical complication.   Pt was given 1mg  IV dilaudid, zofran, and IV fluids.    Pt signed out to Dr. Preston FleetingGlick at shift change. Plan is to f/u on CT abd, consult with general surgery as needed to help determine disposition and tx plan.     Junius FinnerErin O'Malley, PA-C 05/04/14 0104

## 2014-05-04 ENCOUNTER — Emergency Department (HOSPITAL_COMMUNITY): Payer: Medicare Other

## 2014-05-04 ENCOUNTER — Encounter (HOSPITAL_COMMUNITY): Payer: Self-pay | Admitting: Radiology

## 2014-05-04 DIAGNOSIS — R1011 Right upper quadrant pain: Secondary | ICD-10-CM | POA: Diagnosis not present

## 2014-05-04 LAB — CBC WITH DIFFERENTIAL/PLATELET
Basophils Absolute: 0 10*3/uL (ref 0.0–0.1)
Basophils Relative: 0 % (ref 0–1)
Eosinophils Absolute: 0 10*3/uL (ref 0.0–0.7)
Eosinophils Relative: 0 % (ref 0–5)
HCT: 30.4 % — ABNORMAL LOW (ref 36.0–46.0)
Hemoglobin: 10.2 g/dL — ABNORMAL LOW (ref 12.0–15.0)
Lymphocytes Relative: 4 % — ABNORMAL LOW (ref 12–46)
Lymphs Abs: 0.7 10*3/uL (ref 0.7–4.0)
MCH: 21.7 pg — ABNORMAL LOW (ref 26.0–34.0)
MCHC: 33.6 g/dL (ref 30.0–36.0)
MCV: 64.7 fL — ABNORMAL LOW (ref 78.0–100.0)
Monocytes Absolute: 0.6 10*3/uL (ref 0.1–1.0)
Monocytes Relative: 3 % (ref 3–12)
Neutro Abs: 17.3 10*3/uL — ABNORMAL HIGH (ref 1.7–7.7)
Neutrophils Relative %: 93 % — ABNORMAL HIGH (ref 43–77)
Platelets: 95 10*3/uL — ABNORMAL LOW (ref 150–400)
RBC: 4.7 MIL/uL (ref 3.87–5.11)
RDW: 15.3 % (ref 11.5–15.5)
WBC: 18.6 10*3/uL — ABNORMAL HIGH (ref 4.0–10.5)

## 2014-05-04 MED ORDER — IOHEXOL 300 MG/ML  SOLN
70.0000 mL | Freq: Once | INTRAMUSCULAR | Status: AC | PRN
  Administered 2014-05-04: 70 mL via INTRAVENOUS

## 2014-05-04 MED ORDER — IOHEXOL 300 MG/ML  SOLN
25.0000 mL | Freq: Once | INTRAMUSCULAR | Status: AC | PRN
  Administered 2014-05-04: 25 mL via ORAL

## 2014-05-04 NOTE — ED Provider Notes (Signed)
68 year old female had left the scopic cholecystectomy done yesterday. She had some vomiting earlier today. This evening, she had sudden onset of severe mid and upper abdominal pain. On exam, she is resting comfortably but does have significant tenderness in the upper abdomen which is poorly localized. Bowel sounds are decreased. She is noted to have a leukocytosis but this is of uncertain significance since she is 1 day postop. Also noted is thrombocytopenia which is not clinically significant, and mild elevation of transaminases which is also probably not clinically significant. She is sent for CT scan.  CT is unremarkable. Small amount of free air in the abdomen is expected in the postop setting. Case has been discussed with Dr. Andrey CampanileWilson of general surgery who has come to evaluate the patient. I reexamined her and found abdominal tenderness was markedly decreased from what it had been earlier. This was confirmed by Dr. Andrey CampanileWilson who felt that she was safe to discharge. She is to keep her follow-up appointment with Dr. Magnus IvanBlackman.  Results for orders placed or performed during the hospital encounter of 05/03/14  CBC with Differential  Result Value Ref Range   WBC 18.6 (H) 4.0 - 10.5 K/uL   RBC 4.70 3.87 - 5.11 MIL/uL   Hemoglobin 10.2 (L) 12.0 - 15.0 g/dL   HCT 16.130.4 (L) 09.636.0 - 04.546.0 %   MCV 64.7 (L) 78.0 - 100.0 fL   MCH 21.7 (L) 26.0 - 34.0 pg   MCHC 33.6 30.0 - 36.0 g/dL   RDW 40.915.3 81.111.5 - 91.415.5 %   Platelets 95 (L) 150 - 400 K/uL   Neutrophils Relative % 93 (H) 43 - 77 %   Lymphocytes Relative 4 (L) 12 - 46 %   Monocytes Relative 3 3 - 12 %   Eosinophils Relative 0 0 - 5 %   Basophils Relative 0 0 - 1 %   Neutro Abs 17.3 (H) 1.7 - 7.7 K/uL   Lymphs Abs 0.7 0.7 - 4.0 K/uL   Monocytes Absolute 0.6 0.1 - 1.0 K/uL   Eosinophils Absolute 0.0 0.0 - 0.7 K/uL   Basophils Absolute 0.0 0.0 - 0.1 K/uL   RBC Morphology TARGET CELLS   Comprehensive metabolic panel  Result Value Ref Range   Sodium 132 (L)  137 - 147 mEq/L   Potassium 4.0 3.7 - 5.3 mEq/L   Chloride 93 (L) 96 - 112 mEq/L   CO2 27 19 - 32 mEq/L   Glucose, Bld 262 (H) 70 - 99 mg/dL   BUN 33 (H) 6 - 23 mg/dL   Creatinine, Ser 7.820.72 0.50 - 1.10 mg/dL   Calcium 9.1 8.4 - 95.610.5 mg/dL   Total Protein 6.8 6.0 - 8.3 g/dL   Albumin 3.1 (L) 3.5 - 5.2 g/dL   AST 48 (H) 0 - 37 U/L   ALT 66 (H) 0 - 35 U/L   Alkaline Phosphatase 68 39 - 117 U/L   Total Bilirubin 1.3 (H) 0.3 - 1.2 mg/dL   GFR calc non Af Amer 88 (L) >90 mL/min   GFR calc Af Amer >90 >90 mL/min   Anion gap 12 5 - 15  Lipase, blood  Result Value Ref Range   Lipase 13 11 - 59 U/L   Ct Abdomen Pelvis W Contrast  05/04/2014   CLINICAL DATA:  Severe diffuse abdominal pain. Vomiting. Laparoscopic cholecystectomy 1 day ago.  EXAM: CT ABDOMEN AND PELVIS WITH CONTRAST  TECHNIQUE: Multidetector CT imaging of the abdomen and pelvis was performed using the standard protocol  following bolus administration of intravenous contrast.  CONTRAST:  70mL OMNIPAQUE IOHEXOL 300 MG/ML  SOLN  COMPARISON:  None.  FINDINGS: Atelectasis or consolidation in both lung bases.  Surgical absence of the gallbladder with gas and fluid in the gallbladder fossa. Small bowel is a free intra-abdominal air are demonstrated. Changes are likely postoperative.  Diffuse fatty infiltration of the liver with sub cm low-attenuation lesions likely representing cysts or hepatic hemangiomas. The pancreas, spleen, adrenal glands, kidneys, inferior vena cava, and retroperitoneal lymph nodes are unremarkable. Calcification in the abdominal aorta without aneurysm. Stomach appears normal. Small duodenal diverticulum. Contrast material flows through the small bowel to the colon suggesting no evidence of bowel obstruction. Suggestion of small bowel wall thickening in the jejunum probably represents under distended loops. Colon is normal without abnormal distention or wall thickening. No free fluid in the abdomen.  Pelvis: Uterus and  ovaries are not enlarged. Bladder wall is not thickened. No free or loculated pelvic fluid collections. With appendix is normal. No destructive bone lesions.  IMPRESSION: Surgical absence of the gallbladder with gas and fluid in the gallbladder fossa and small amount of free intra-abdominal air likely postoperative changes. Small duodenal diverticulum. Diffuse fatty infiltration of the liver. Atelectasis in both lung bases.   Electronically Signed   By: Burman NievesWilliam  Stevens M.D.   On: 05/04/2014 04:20      Medical screening examination/treatment/procedure(s) were conducted as a shared visit with non-physician practitioner(s) and myself.  I personally evaluated the patient during the encounter.   Dione Boozeavid Solina Heron, MD 05/04/14 [phone]

## 2014-05-04 NOTE — Discharge Instructions (Signed)
Continue routine post-op care. Return if symptoms are getting worse.

## 2014-05-04 NOTE — ED Notes (Signed)
Patient up to bathroom, ambulated with assistance per request versus wheelchair.

## 2014-05-04 NOTE — ED Notes (Signed)
Informed CT patient is done with contrast 

## 2014-05-04 NOTE — ED Notes (Signed)
Patient is resting with eyes closed.  No s/sx of distress or pain.  Family at bedside

## 2014-05-04 NOTE — ED Notes (Signed)
Patient finished contrast, CT notified. 

## 2014-05-04 NOTE — Consult Note (Signed)
Reason for Consult:abd pain after cholecystectomy Referring Physician: [name] is an 68 y.o. female.  HPI: 68 yo non english speaking Olivia Williamson female s/p lap chole 2 days ago by Dr Ninfa Linden brought to ED last evening by daughter after c/o of abdominal pain, nausea, and multiple episodes of emesis. No fever, chills. No cp. Decreased appetite as a result. Daughter serves as Astronomer.   Past Medical History  Diagnosis Date  . Hypertension     meds stopped 1 week ago , need to get renewed  . GERD (gastroesophageal reflux disease)   . Diabetes mellitus without complication     stopped DM meds 2004  . Arthritis     Past Surgical History  Procedure Laterality Date  . Colonoscopy    . No past surgeries      No family history on file.  Social History:  reports that she has never smoked. She has never used smokeless tobacco. She reports that she does not drink alcohol or use illicit drugs.  Allergies: No Known Allergies  Medications: I have reviewed the patient's current medications.  Results for orders placed or performed during the hospital encounter of 05/03/14 (from the past 48 hour(s))  CBC with Differential     Status: Abnormal   Collection Time: 05/03/14 11:21 PM  Result Value Ref Range   WBC 18.6 (H) 4.0 - 10.5 K/uL   RBC 4.70 3.87 - 5.11 MIL/uL   Hemoglobin 10.2 (L) 12.0 - 15.0 g/dL   HCT 30.4 (L) 36.0 - 46.0 %   MCV 64.7 (L) 78.0 - 100.0 fL   MCH 21.7 (L) 26.0 - 34.0 pg   MCHC 33.6 30.0 - 36.0 g/dL   RDW 15.3 11.5 - 15.5 %   Platelets 95 (L) 150 - 400 K/uL    Comment: REPEATED TO VERIFY PLATELET COUNT CONFIRMED BY SMEAR    Neutrophils Relative % 93 (H) 43 - 77 %   Lymphocytes Relative 4 (L) 12 - 46 %   Monocytes Relative 3 3 - 12 %   Eosinophils Relative 0 0 - 5 %   Basophils Relative 0 0 - 1 %   Neutro Abs 17.3 (H) 1.7 - 7.7 K/uL   Lymphs Abs 0.7 0.7 - 4.0 K/uL   Monocytes Absolute 0.6 0.1 - 1.0 K/uL   Eosinophils Absolute 0.0 0.0 - 0.7 K/uL    Basophils Absolute 0.0 0.0 - 0.1 K/uL   RBC Morphology TARGET CELLS     Comment: SPHEROCYTES  Comprehensive metabolic panel     Status: Abnormal   Collection Time: 05/03/14 11:21 PM  Result Value Ref Range   Sodium 132 (L) 137 - 147 mEq/L   Potassium 4.0 3.7 - 5.3 mEq/L   Chloride 93 (L) 96 - 112 mEq/L   CO2 27 19 - 32 mEq/L   Glucose, Bld 262 (H) 70 - 99 mg/dL   BUN 33 (H) 6 - 23 mg/dL   Creatinine, Ser 0.72 0.50 - 1.10 mg/dL   Calcium 9.1 8.4 - 10.5 mg/dL   Total Protein 6.8 6.0 - 8.3 g/dL   Albumin 3.1 (L) 3.5 - 5.2 g/dL   AST 48 (H) 0 - 37 U/L   ALT 66 (H) 0 - 35 U/L   Alkaline Phosphatase 68 39 - 117 U/L   Total Bilirubin 1.3 (H) 0.3 - 1.2 mg/dL   GFR calc non Af Amer 88 (L) >90 mL/min   GFR calc Af Amer >90 >90 mL/min    Comment: (NOTE) The  eGFR has been calculated using the CKD EPI equation. This calculation has not been validated in all clinical situations. eGFR's persistently <90 mL/min signify possible Chronic Kidney Disease.    Anion gap 12 5 - 15  Lipase, blood     Status: None   Collection Time: 05/03/14 11:21 PM  Result Value Ref Range   Lipase 13 11 - 59 U/L    Ct Abdomen Pelvis W Contrast  05/04/2014   CLINICAL DATA:  Severe diffuse abdominal pain. Vomiting. Laparoscopic cholecystectomy 1 day ago.  EXAM: CT ABDOMEN AND PELVIS WITH CONTRAST  TECHNIQUE: Multidetector CT imaging of the abdomen and pelvis was performed using the standard protocol following bolus administration of intravenous contrast.  CONTRAST:  43m OMNIPAQUE IOHEXOL 300 MG/ML  SOLN  COMPARISON:  None.  FINDINGS: Atelectasis or consolidation in both lung bases.  Surgical absence of the gallbladder with gas and fluid in the gallbladder fossa. Small bowel is a free intra-abdominal air are demonstrated. Changes are likely postoperative.  Diffuse fatty infiltration of the liver with sub cm low-attenuation lesions likely representing cysts or hepatic hemangiomas. The pancreas, spleen, adrenal glands,  kidneys, inferior vena cava, and retroperitoneal lymph nodes are unremarkable. Calcification in the abdominal aorta without aneurysm. Stomach appears normal. Small duodenal diverticulum. Contrast material flows through the small bowel to the colon suggesting no evidence of bowel obstruction. Suggestion of small bowel wall thickening in the jejunum probably represents under distended loops. Colon is normal without abnormal distention or wall thickening. No free fluid in the abdomen.  Pelvis: Uterus and ovaries are not enlarged. Bladder wall is not thickened. No free or loculated pelvic fluid collections. With appendix is normal. No destructive bone lesions.  IMPRESSION: Surgical absence of the gallbladder with gas and fluid in the gallbladder fossa and small amount of free intra-abdominal air likely postoperative changes. Small duodenal diverticulum. Diffuse fatty infiltration of the liver. Atelectasis in both lung bases.   Electronically Signed   By: WLucienne CapersM.D.   On: 05/04/2014 04:20    Review of Systems  Constitutional: Negative for fever and chills.  Respiratory: Negative for sputum production.   Cardiovascular: Negative for chest pain.  Gastrointestinal: Positive for nausea, vomiting and abdominal pain.   Blood pressure 108/58, pulse 80, temperature 98.1 F (36.7 C), temperature source Oral, resp. rate 16, SpO2 100 %. Physical Exam  Vitals reviewed. Constitutional: Vital signs are normal. She appears well-developed and well-nourished.  Non-toxic appearance. She does not have a sickly appearance. She does not appear ill. No distress.  HENT:  Head: Normocephalic and atraumatic.  Right Ear: External ear normal.  Left Ear: External ear normal.  Eyes: Conjunctivae are normal. No scleral icterus.  Neck: Normal range of motion. Neck supple. No tracheal deviation present.  Cardiovascular: Normal rate, normal heart sounds and intact distal pulses.   Respiratory: Effort normal and breath  sounds normal. No stridor. No respiratory distress. She has no wheezes.  GI: Soft. She exhibits no distension. There is no rebound and no guarding.  Incisions c/d/i; MILD expected TTP in upper abd. No guarding/rt/peritonitis.  Musculoskeletal: She exhibits no edema or tenderness.  Lymphadenopathy:    She has no cervical adenopathy.  Neurological: She is alert. She exhibits normal muscle tone.  Skin: Skin is warm and dry. No rash noted. She is not diaphoretic. No erythema. No pallor.  Psychiatric: She has a normal mood and affect. Her behavior is normal. Judgment and thought content normal.    Assessment/Plan: S/p lap cholecystectomy for  chronic cholecystitis with gallstones.  Nausea/vomiting/abd pain DM2 Thrombocytopenia  She does not appear toxic. She has been resting comfortably all night. Her vitals have been normal all night. Her CT scan demonstrates typical postop changes. Her transaminases are not out of character for being 2 days out from surgery. Her WBC is elevated - could be reactive. Her plt count was low preoperatively.   I think it is safe to let her go home. I talked with the daughter that if she has recurrent symptoms to bring her back to ED for further evaluation.   Discussed with dr Filbert Berthold. Redmond Pulling, MD, FACS General, Bariatric, & Minimally Invasive Surgery [facility], [place]   [facility] M 05/04/2014, 6:27 AM

## 2014-05-04 NOTE — ED Notes (Signed)
Oxygen sats dropped to 85% after pain medication administration. Placed on 2L via [place], sats now 98%

## 2014-05-11 ENCOUNTER — Emergency Department (HOSPITAL_COMMUNITY): Admission: EM | Admit: 2014-05-11 | Payer: Medicare Other | Source: Home / Self Care

## 2014-08-27 ENCOUNTER — Other Ambulatory Visit: Payer: Self-pay | Admitting: Internal Medicine

## 2014-08-27 DIAGNOSIS — Z1231 Encounter for screening mammogram for malignant neoplasm of breast: Secondary | ICD-10-CM

## 2014-09-04 ENCOUNTER — Inpatient Hospital Stay: Admission: RE | Admit: 2014-09-04 | Payer: Medicare Other | Source: Ambulatory Visit

## 2014-12-10 ENCOUNTER — Ambulatory Visit (INDEPENDENT_AMBULATORY_CARE_PROVIDER_SITE_OTHER): Payer: Medicare Other | Admitting: Physician Assistant

## 2014-12-10 VITALS — BP 142/74 | HR 82 | Temp 97.1°F | Resp 18 | Ht 59.5 in | Wt 76.8 lb

## 2014-12-10 DIAGNOSIS — M6749 Ganglion, multiple sites: Secondary | ICD-10-CM

## 2014-12-10 DIAGNOSIS — IMO0002 Reserved for concepts with insufficient information to code with codable children: Secondary | ICD-10-CM

## 2014-12-10 MED ORDER — MUPIROCIN 2 % EX OINT: 1.0000 "application " | TOPICAL_OINTMENT | Freq: Three times a day (TID) | CUTANEOUS | Status: DC

## 2014-12-10 NOTE — Patient Instructions (Signed)
Stop applying home creams to area on hand. Apply bactroban three times a day - this is to prevent infection. Take ibuprofen/tylenol for pain. Figure out PMH and medications to be able to tell hand surgeon. You will get a phone call to make appointment with hand surgeon.

## 2014-12-10 NOTE — Progress Notes (Signed)
[facility] [address], [place] [address] [phone]  Date:  12/10/2014   Name:  Olivia BuddMoihh Sweeney   DOB:  1945/06/03   MRN:  [id number]  PCP:  Pcp Not In System    Chief Complaint: Mass   History of Present Illness:  This is a 69 y.o. female who is presenting with an itchy and painful lesion on her left hand x 1 month. She is here with her daughter who is translating for her. Daughter does know pt's health history other than diabetes. She think pt takes medications regularly but is not sure of the names. Pt has a PCP but does not know the name. Daughter states pt had an abdominal surgery 3 months ago but not sure why.   Lesion on hand has been growing. She has never had anything like this before. She has been putting an OTC anti-itch medication on lesion and not very helpful. She otherwise feels well - denies fever or chills. She has never had any skin cancers.  Review of Systems:  Review of Systems See HPI   There are no active problems to display for this patient.   Prior to Admission medications   Not on File    No Known Allergies  Past Surgical History  Procedure Laterality Date  . Colonoscopy    . No past surgeries    . Cholecystectomy N/A 05/02/2014    Procedure: LAPAROSCOPIC CHOLECYSTECTOMY ;  Surgeon: Abigail Miyamotoouglas Blackman, MD;  [facility];  Service: General;  Laterality: N/A;  laparoscopic cholecystectomy    History  Substance Use Topics  . Smoking status: Never Smoker   . Smokeless tobacco: Never Used  . Alcohol Use: No    History reviewed. No pertinent family history.  Medication list has been reviewed and updated.  Physical Examination:  Physical Exam  Constitutional: She is oriented to person, place, and time. She appears well-developed and well-nourished. No distress.  HENT:  Head: Normocephalic and atraumatic.  Right Ear: Hearing normal.  Left Ear: Hearing normal.  Nose: Nose normal.  Eyes: Conjunctivae and lids are  normal. Right eye exhibits no discharge. Left eye exhibits no discharge. No scleral icterus.  Cardiovascular: Normal rate, regular rhythm, normal heart sounds and normal pulses.   No murmur heard. Pulmonary/Chest: Effort normal and breath sounds normal. No respiratory distress. She has no wheezes. She has no rhonchi. She has no rales.  Musculoskeletal: Normal range of motion.       Left hand: She exhibits tenderness. She exhibits normal range of motion and no laceration.  Neurological: She is alert and oriented to person, place, and time.  Skin: Skin is warm, dry and intact.  2 cm discrete mass over left 1st [facility] joint. Gelatinous texture with palpating. Overlying skin erythematous and flaking. TTP.  Psychiatric: She has a normal mood and affect. Her speech is normal and behavior is normal. Thought content normal.   BP 142/74 mmHg  Pulse 82  Temp(Src) 97.1 F (36.2 C) (Oral)  Resp 18  Ht 4' 11.5" (1.511 m)  Wt 76 lb 12.8 oz (34.836 kg)  BMI 15.26 kg/m2  SpO2 99%  Assessment and Plan:  1. Cyst in hand Mass [facility] consistent with cyst. Other possibility: skin cancer, but less likely. Advised to stop OTC creams. Mupirocin prescribed to prevent infection esp since hx of DM. Referred to hand surgery.  - Ambulatory referral to Hand Surgery - mupirocin ointment (BACTROBAN) 2 %; Apply 1 application  topically 3 (three) times daily.  Dispense: 30 g; Refill: 0   Roswell Miners. Dyke Brackett, MHS [facility]  12/10/2014

## 2015-02-06 ENCOUNTER — Other Ambulatory Visit: Payer: Self-pay | Admitting: Orthopedic Surgery

## 2015-12-14 DIAGNOSIS — E1142 Type 2 diabetes mellitus with diabetic polyneuropathy: Principal | ICD-10-CM | POA: Diagnosis present

## 2015-12-14 DIAGNOSIS — D649 Anemia, unspecified: Secondary | ICD-10-CM | POA: Diagnosis present

## 2015-12-14 DIAGNOSIS — E1165 Type 2 diabetes mellitus with hyperglycemia: Secondary | ICD-10-CM | POA: Diagnosis present

## 2015-12-14 DIAGNOSIS — K219 Gastro-esophageal reflux disease without esophagitis: Secondary | ICD-10-CM | POA: Diagnosis present

## 2015-12-14 DIAGNOSIS — I10 Essential (primary) hypertension: Secondary | ICD-10-CM | POA: Diagnosis present

## 2015-12-14 DIAGNOSIS — E871 Hypo-osmolality and hyponatremia: Secondary | ICD-10-CM | POA: Diagnosis present

## 2015-12-14 DIAGNOSIS — E43 Unspecified severe protein-calorie malnutrition: Secondary | ICD-10-CM | POA: Diagnosis present

## 2015-12-14 DIAGNOSIS — Z681 Body mass index (BMI) 19 or less, adult: Secondary | ICD-10-CM

## 2015-12-14 DIAGNOSIS — Z9114 Patient's other noncompliance with medication regimen: Secondary | ICD-10-CM

## 2015-12-15 ENCOUNTER — Inpatient Hospital Stay (HOSPITAL_COMMUNITY)
Admission: EM | Admit: 2015-12-15 | Discharge: 2015-12-16 | DRG: 073 | Disposition: A | Discharge status: Home / Self Care | Payer: Medicare Other | Attending: Internal Medicine | Admitting: Internal Medicine

## 2015-12-15 ENCOUNTER — Encounter (HOSPITAL_COMMUNITY): Payer: Self-pay | Admitting: *Deleted

## 2015-12-15 DIAGNOSIS — D61818 Other pancytopenia: Secondary | ICD-10-CM | POA: Diagnosis present

## 2015-12-15 DIAGNOSIS — E43 Unspecified severe protein-calorie malnutrition: Secondary | ICD-10-CM | POA: Insufficient documentation

## 2015-12-15 DIAGNOSIS — E1342 Other specified diabetes mellitus with diabetic polyneuropathy: Secondary | ICD-10-CM

## 2015-12-15 DIAGNOSIS — E871 Hypo-osmolality and hyponatremia: Secondary | ICD-10-CM | POA: Diagnosis present

## 2015-12-15 DIAGNOSIS — I10 Essential (primary) hypertension: Secondary | ICD-10-CM | POA: Diagnosis not present

## 2015-12-15 DIAGNOSIS — E1165 Type 2 diabetes mellitus with hyperglycemia: Secondary | ICD-10-CM | POA: Diagnosis not present

## 2015-12-15 DIAGNOSIS — D649 Anemia, unspecified: Secondary | ICD-10-CM | POA: Diagnosis not present

## 2015-12-15 DIAGNOSIS — E119 Type 2 diabetes mellitus without complications: Secondary | ICD-10-CM

## 2015-12-15 DIAGNOSIS — Z9114 Patient's other noncompliance with medication regimen: Secondary | ICD-10-CM | POA: Diagnosis not present

## 2015-12-15 DIAGNOSIS — M79673 Pain in unspecified foot: Secondary | ICD-10-CM | POA: Diagnosis present

## 2015-12-15 DIAGNOSIS — R739 Hyperglycemia, unspecified: Secondary | ICD-10-CM | POA: Diagnosis not present

## 2015-12-15 DIAGNOSIS — E1149 Type 2 diabetes mellitus with other diabetic neurological complication: Secondary | ICD-10-CM | POA: Insufficient documentation

## 2015-12-15 DIAGNOSIS — R110 Nausea: Secondary | ICD-10-CM | POA: Diagnosis present

## 2015-12-15 DIAGNOSIS — E1142 Type 2 diabetes mellitus with diabetic polyneuropathy: Secondary | ICD-10-CM | POA: Diagnosis not present

## 2015-12-15 DIAGNOSIS — Z681 Body mass index (BMI) 19 or less, adult: Secondary | ICD-10-CM | POA: Diagnosis not present

## 2015-12-15 DIAGNOSIS — E860 Dehydration: Secondary | ICD-10-CM | POA: Diagnosis present

## 2015-12-15 DIAGNOSIS — R531 Weakness: Secondary | ICD-10-CM

## 2015-12-15 DIAGNOSIS — K219 Gastro-esophageal reflux disease without esophagitis: Secondary | ICD-10-CM | POA: Insufficient documentation

## 2015-12-15 LAB — GLUCOSE, CAPILLARY
GLUCOSE-CAPILLARY: 448 mg/dL — AB (ref 65–99)
GLUCOSE-CAPILLARY: 395 mg/dL — AB (ref 65–99)
GLUCOSE-CAPILLARY: 287 mg/dL — AB (ref 65–99)
GLUCOSE-CAPILLARY: 169 mg/dL — AB (ref 65–99)
GLUCOSE-CAPILLARY: 261 mg/dL — AB (ref 65–99)
GLUCOSE-CAPILLARY: 115 mg/dL — AB (ref 65–99)
GLUCOSE-CAPILLARY: 141 mg/dL — AB (ref 65–99)
GLUCOSE-CAPILLARY: 79 mg/dL (ref 65–99)
Glucose-Capillary: 472 mg/dL — ABNORMAL HIGH (ref 65–99)
Glucose-Capillary: 276 mg/dL — ABNORMAL HIGH (ref 65–99)
Glucose-Capillary: 211 mg/dL — ABNORMAL HIGH (ref 65–99)
Glucose-Capillary: 81 mg/dL (ref 65–99)

## 2015-12-15 LAB — COMPREHENSIVE METABOLIC PANEL
ALBUMIN: 3.3 g/dL — AB (ref 3.5–5.0)
ALK PHOS: 144 U/L — AB (ref 38–126)
ALT: 35 U/L (ref 14–54)
AST: 22 U/L (ref 15–41)
Anion gap: 7 (ref 5–15)
BUN: 22 mg/dL — AB (ref 6–20)
CALCIUM: 9.2 mg/dL (ref 8.9–10.3)
CO2: 28 mmol/L (ref 22–32)
Chloride: 91 mmol/L — ABNORMAL LOW (ref 101–111)
Creatinine, Ser: 0.7 mg/dL (ref 0.44–1.00)
GFR calc Af Amer: >60 mL/min (ref >60)
GFR calc non Af Amer: >60 mL/min (ref >60)
Glucose, Bld: 661 mg/dL (ref 65–99)
POTASSIUM: 4 mmol/L (ref 3.5–5.1)
Sodium: 126 mmol/L — ABNORMAL LOW (ref 135–145)
TOTAL PROTEIN: 6.8 g/dL (ref 6.5–8.1)
Total Bilirubin: 0.8 mg/dL (ref 0.3–1.2)

## 2015-12-15 LAB — URINALYSIS, ROUTINE W REFLEX MICROSCOPIC
BILIRUBIN URINE: NEGATIVE
Glucose, UA: >1000 mg/dL — AB
Hgb urine dipstick: NEGATIVE
Ketones, ur: NEGATIVE mg/dL
NITRITE: NEGATIVE
Protein, ur: NEGATIVE mg/dL
SPECIFIC GRAVITY, URINE: 1.034 — AB (ref 1.005–1.030)
pH: 6 (ref 5.0–8.0)

## 2015-12-15 LAB — CBC
HCT: 32.4 % — ABNORMAL LOW (ref 36.0–46.0)
HEMOGLOBIN: 10.6 g/dL — AB (ref 12.0–15.0)
MCH: 20.2 pg — ABNORMAL LOW (ref 26.0–34.0)
MCHC: 32.7 g/dL (ref 30.0–36.0)
MCV: 61.7 fL — ABNORMAL LOW (ref 78.0–100.0)
Platelets: 134 10*3/uL — ABNORMAL LOW (ref 150–400)
RBC: 5.25 MIL/uL — ABNORMAL HIGH (ref 3.87–5.11)
RDW: 16.2 % — ABNORMAL HIGH (ref 11.5–15.5)
WBC: 7 10*3/uL (ref 4.0–10.5)

## 2015-12-15 LAB — URINE MICROSCOPIC-ADD ON: RBC / HPF: NONE SEEN RBC/hpf (ref 0–5)

## 2015-12-15 LAB — CBG MONITORING, ED
GLUCOSE-CAPILLARY: 265 mg/dL — AB (ref 65–99)
GLUCOSE-CAPILLARY: 256 mg/dL — AB (ref 65–99)
Glucose-Capillary: 587 mg/dL (ref 65–99)
Glucose-Capillary: 479 mg/dL — ABNORMAL HIGH (ref 65–99)

## 2015-12-15 LAB — BASIC METABOLIC PANEL
Anion gap: 4 — ABNORMAL LOW (ref 5–15)
BUN: 16 mg/dL (ref 6–20)
CHLORIDE: 105 mmol/L (ref 101–111)
CO2: 25 mmol/L (ref 22–32)
CREATININE: 0.43 mg/dL — AB (ref 0.44–1.00)
Calcium: 8 mg/dL — ABNORMAL LOW (ref 8.9–10.3)
GFR calc non Af Amer: >60 mL/min (ref >60)
GLUCOSE: 255 mg/dL — AB (ref 65–99)
Potassium: 3.3 mmol/L — ABNORMAL LOW (ref 3.5–5.1)
Sodium: 134 mmol/L — ABNORMAL LOW (ref 135–145)

## 2015-12-15 LAB — VITAMIN B12: Vitamin B-12: 810 pg/mL (ref 180–914)

## 2015-12-15 LAB — TROPONIN I

## 2015-12-15 LAB — FOLATE: Folate: 19.8 ng/mL (ref >5.9)

## 2015-12-15 LAB — IRON AND TIBC
Iron: 34 ug/dL (ref 28–170)
SATURATION RATIOS: 16 % (ref 10.4–31.8)
TIBC: 213 ug/dL — AB (ref 250–450)
UIBC: 179 ug/dL

## 2015-12-15 LAB — RETICULOCYTES
RBC.: 4.71 MIL/uL (ref 3.87–5.11)
RETIC CT PCT: 3.1 % (ref 0.4–3.1)
Retic Count, Absolute: 146 10*3/uL (ref 19.0–186.0)

## 2015-12-15 LAB — FERRITIN: FERRITIN: 245 ng/mL (ref 11–307)

## 2015-12-15 MED ORDER — INSULIN ASPART 100 UNIT/ML ~~LOC~~ SOLN: 0.0000 [IU] | Freq: Three times a day (TID) | SUBCUTANEOUS | Status: DC

## 2015-12-15 MED ORDER — INSULIN ASPART 100 UNIT/ML ~~LOC~~ SOLN: 0.0000 [IU] | Freq: Every day | SUBCUTANEOUS | Status: DC

## 2015-12-15 MED ORDER — ACETAMINOPHEN 650 MG RE SUPP: 650.0000 mg | Freq: Four times a day (QID) | RECTAL | Status: DC | PRN

## 2015-12-15 MED ORDER — INSULIN REGULAR BOLUS VIA INFUSION
0.0000 [IU] | Freq: Three times a day (TID) | INTRAVENOUS | Status: DC
  Filled 2015-12-15: qty 10

## 2015-12-15 MED ORDER — INSULIN GLARGINE 100 UNIT/ML ~~LOC~~ SOLN
5.0000 [IU] | Freq: Every day | SUBCUTANEOUS | Status: DC
  Administered 2015-12-15: 5 [IU] via SUBCUTANEOUS
  Filled 2015-12-15 (×2): qty 0.05

## 2015-12-15 MED ORDER — SODIUM CHLORIDE 0.9 % IV SOLN
INTRAVENOUS | Status: DC
  Administered 2015-12-15: 3.9 [IU]/h via INTRAVENOUS
  Filled 2015-12-15: qty 2.5

## 2015-12-15 MED ORDER — SODIUM CHLORIDE 0.9% FLUSH
3.0000 mL | Freq: Two times a day (BID) | INTRAVENOUS | Status: DC
  Administered 2015-12-15 – 2015-12-16 (×3): 3 mL via INTRAVENOUS

## 2015-12-15 MED ORDER — DEXTROSE-NACL 5-0.45 % IV SOLN
INTRAVENOUS | Status: DC
  Administered 2015-12-15: 21:00:00 via INTRAVENOUS

## 2015-12-15 MED ORDER — INSULIN ASPART 100 UNIT/ML IV SOLN
INTRAVENOUS | Status: AC
  Filled 2015-12-15: qty 1

## 2015-12-15 MED ORDER — ACETAMINOPHEN 325 MG PO TABS
650.0000 mg | ORAL_TABLET | Freq: Four times a day (QID) | ORAL | Status: DC | PRN
  Administered 2015-12-15 (×2): 650 mg via ORAL
  Filled 2015-12-15 (×2): qty 2

## 2015-12-15 MED ORDER — SODIUM CHLORIDE 0.9 % IV SOLN
INTRAVENOUS | Status: AC
  Administered 2015-12-15: 08:00:00 via INTRAVENOUS

## 2015-12-15 MED ORDER — INSULIN ASPART 100 UNIT/ML ~~LOC~~ SOLN: 10.0000 [IU] | Freq: Once | SUBCUTANEOUS | Status: DC

## 2015-12-15 MED ORDER — ONDANSETRON HCL 4 MG PO TABS: 4.0000 mg | ORAL_TABLET | Freq: Four times a day (QID) | ORAL | Status: DC | PRN

## 2015-12-15 MED ORDER — ONDANSETRON HCL 4 MG/2ML IJ SOLN: 4.0000 mg | Freq: Four times a day (QID) | INTRAMUSCULAR | Status: DC | PRN

## 2015-12-15 MED ORDER — SENNOSIDES-DOCUSATE SODIUM 8.6-50 MG PO TABS: 1.0000 | ORAL_TABLET | Freq: Every evening | ORAL | Status: DC | PRN

## 2015-12-15 MED ORDER — ENOXAPARIN SODIUM 30 MG/0.3ML ~~LOC~~ SOLN
30.0000 mg | SUBCUTANEOUS | Status: DC
  Administered 2015-12-15 – 2015-12-16 (×2): 30 mg via SUBCUTANEOUS
  Filled 2015-12-15 (×2): qty 0.3

## 2015-12-15 MED ORDER — SODIUM CHLORIDE 0.9 % IV SOLN
INTRAVENOUS | Status: DC
  Administered 2015-12-15: 14:00:00 via INTRAVENOUS

## 2015-12-15 MED ORDER — DEXTROSE 50 % IV SOLN: 25.0000 mL | INTRAVENOUS | Status: DC | PRN

## 2015-12-15 NOTE — Progress Notes (Signed)
Inpatient Diabetes Program Recommendations  AACE/ADA: New Consensus Statement on Inpatient Glycemic Control (2015)  Target Ranges:  Prepandial:   less than 140 mg/dL      Peak postprandial:   less than 180 mg/dL (1-2 hours)      Critically ill patients:  140 - 180 mg/dL   Results for Olivia Williamson, Olivia Williamson (MRN [id number]) as of 12/15/2015 15:36  Ref. Range 12/15/2015 00:30  Sodium Latest Ref Range: 135 - 145 mmol/L 126 (L)  Potassium Latest Ref Range: 3.5 - 5.1 mmol/L 4.0  Chloride Latest Ref Range: 101 - 111 mmol/L 91 (L)  CO2 Latest Ref Range: 22 - 32 mmol/L 28  BUN Latest Ref Range: 6 - 20 mg/dL 22 (H)  Creatinine Latest Ref Range: 0.44 - 1.00 mg/dL 0.70  Calcium Latest Ref Range: 8.9 - 10.3 mg/dL 9.2  EGFR (Non-African Amer.) Latest Ref Range: >60 mL/min >60  EGFR (African American) Latest Ref Range: >60 mL/min >60  Glucose Latest Ref Range: 65 - 99 mg/dL 661 (HH)  Anion gap Latest Ref Range: 5 - 15  7    Admit with: Hyperlgycemia  History: DM  Home DM Meds: None for questionable amount of time (has taken Metformin in the past)  Current Insulin Orders: IV Insulin drip (Lantus 5 units given at 10 am today)     -Will have DM Coordinator follow up with patient and family with interpreter tomorrow (07/24) to assess learning/medication needs.  -Current A1c pending.  Note patient may need insulin at time of d/c pending A1c results.  -Will also ask RD to visit with patient and family (with interpreter) to assist with DM diet re-education.     MD- When IV Insulin drip stopped, please consider starting Novolog Sensitive Correction Scale/ SSI (0-9 units) TID AC + HS     --Will follow patient during hospitalization--  Wyn Quaker RN, MSN, CDE Diabetes Coordinator Inpatient Glycemic Control Team Team Pager: [phone] (8a-5p)

## 2015-12-15 NOTE — ED Notes (Signed)
Admitting provider at bedside.

## 2015-12-15 NOTE — ED Notes (Signed)
See downtime charting. 

## 2015-12-15 NOTE — Progress Notes (Signed)
Patient's CBG 472. MD notified.  Awaiting MD orders. A.Kyah Buesing, RN

## 2015-12-15 NOTE — ED Notes (Signed)
Discontinued 10 units insulin per Rhunette Croft MD.

## 2015-12-15 NOTE — ED Provider Notes (Signed)
[facility] Provider Note   CSN: [id number] Arrival date & time: 12/14/15  2353  First Provider Contact:  None       History   Chief Complaint Chief Complaint  Patient presents with  . foot pain     HPI Olivia Williamson is a 70 y.o. female.  Pt comes in with cc of weakness. Son helping with the translation. PT has hx of Diabetes, and per son she has not been taking meds for it since she ran out of them within the last 2 months. Pt has been living with him for 2 months now. Pt has been getting weaker and unable to ambulate. Pt also having urinary frequency. Pt denies nausea, emesis, fevers, chills, chest pains, shortness of breath, headaches, abdominal pain. Pt was unaware that her blood sugars were elevated.   ROS 10 Systems reviewed and are negative for acute change except as noted in the HPI.         Past Medical History:  Diagnosis Date  . Arthritis   . Diabetes mellitus without complication (HCC)    stopped DM meds 2004  . GERD (gastroesophageal reflux disease)   . Hypertension    meds stopped 1 week ago , need to get renewed    There are no active problems to display for this patient.   Past Surgical History:  Procedure Laterality Date  . CHOLECYSTECTOMY N/A 05/02/2014   Procedure: LAPAROSCOPIC CHOLECYSTECTOMY ;  Surgeon: Abigail Miyamoto, MD;  Location[facility] OR;  Service: General;  Laterality: N/A;  laparoscopic cholecystectomy  . COLONOSCOPY    . NO PAST SURGERIES      OB History    No data available       Home Medications    Prior to Admission medications   Medication Sig Start Date End Date Taking? Authorizing Provider  mupirocin ointment (BACTROBAN) 2 % Apply 1 application topically 3 (three) times daily. 12/10/14   Dorna Leitz, PA-C    Family History No family history on file.  Social History Social History  Substance Use Topics  . Smoking status: Never Smoker  . Smokeless tobacco: Never Used  . Alcohol use No     Allergies     Review of patient's allergies indicates no known allergies.   Review of Systems Review of Systems   Physical Exam Updated Vital Signs BP 138/70   Pulse 80   Temp 98.4 F (36.9 C) (Oral)   Resp 20   Wt 74 lb (33.6 kg)   SpO2 100%   BMI 14.70 kg/m   Physical Exam  Constitutional: No distress.  HENT:  Head: Normocephalic and atraumatic.  Eyes: Conjunctivae are normal.  Neck: Neck supple.  Cardiovascular: Normal rate and regular rhythm.   No murmur heard. Pulmonary/Chest: Effort normal and breath sounds normal. No respiratory distress.  Abdominal: Soft. She exhibits no distension. There is no tenderness. There is no guarding.  Musculoskeletal: She exhibits no edema.  Neurological: She is alert.  Skin: Skin is warm and dry.  Psychiatric: She has a normal mood and affect.  Nursing note and vitals reviewed.    ED Treatments / Results  Labs (all labs ordered are listed, but only abnormal results are displayed) Labs Reviewed  CBC - Abnormal; Notable for the following:       Result Value   RBC 5.25 (*)    Hemoglobin 10.6 (*)    HCT 32.4 (*)    MCV 61.7 (*)    MCH 20.2 (*)  RDW 16.2 (*)    Platelets 134 (*)    All other components within normal limits  COMPREHENSIVE METABOLIC PANEL - Abnormal; Notable for the following:    Sodium 126 (*)    Chloride 91 (*)    Glucose, Bld 661 (*)    BUN 22 (*)    Albumin 3.3 (*)    Alkaline Phosphatase 144 (*)    All other components within normal limits  CBG MONITORING, ED - Abnormal; Notable for the following:    Glucose-Capillary 587 (*)    All other components within normal limits  CBG MONITORING, ED - Abnormal; Notable for the following:    Glucose-Capillary 479 (*)    All other components within normal limits  URINE CULTURE  URINALYSIS, ROUTINE W REFLEX MICROSCOPIC (NOT AT [facility])    EKG  EKG Interpretation None       Radiology No results found.  Procedures Procedures (including critical care  time)  Medications Ordered in ED Medications  insulin aspart (novoLOG) 100 unit/mL injection (not administered)     Initial Impression / Assessment and Plan / ED Course  I have reviewed the triage vital signs and the nursing notes.  Pertinent labs & imaging results that were available during my care of the patient were reviewed by me and considered in my medical decision making (see chart for details).  Clinical Course    Pt comes in with generalized weakness and foot pain. Foot pain - no signs of infection or necrosis. Weakness - pt is noted to have elevated blood sugar, but no DKA. Will correct sugars. Her weakness likely due to her uncontrolled DM. Pt is speaking Hmong only, she is unsure what her meds are and has no pcp - best to admit for optimization of her dm and education. Will screen for UTI and get cardiac labs.  Final Clinical Impressions(s) / ED Diagnoses   Final diagnoses:  Other specified diabetes mellitus with diabetic polyneuropathy [facility])    New Prescriptions New Prescriptions   No medications on file     Derwood Kaplan, MD 12/15/15 [phone]

## 2015-12-15 NOTE — ED Notes (Signed)
CBG 265, holding 10 units insulin until speaking with Rhunette Croft MD

## 2015-12-15 NOTE — H&P (Signed)
History and Physical    Olivia Williamson UVO:[id number] DOB: [birth year] DOA: 12/15/2015  PCP: Pcp Not In System Patient coming from: home  Chief Complaint:  Generalized weakness  HPI: Olivia Williamson is a 70 y.o. female with medical history significant for diabetes, hypertension, GERD, presents to the emergency department from home with the chief complaint of generalized weakness. Initial evaluation reveals hyperglycemia anemia    She speaks no English she speaks Hmong only,  so her son interpreting at bedside. He states she's been here for 2 months has not had any medications to take. He reports gradual worsening of generalized weakness progressing to the point of inability to ambulate. He also reports urinary frequency. In addition her oral intake has been much less. He denies nausea vomiting diarrhea. He denies any fever chills complaints of chest pain shortness of breath headache or abdominal pain. He is unaware of how patient manages her diabetes. Unsure if she has MD, supplies, monitors CBG's. He does report she complains of chronic foot pain.    ED Course: Emergency department she was noted to have elevated blood sugar without and an ion gap. She is afebrile hemodynamically stable and not hypoxic.  Review of Systems: As per HPI otherwise 10 point review of systems negative per her son  Ambulatory Status: Ambulates independently at home with stable gait  Past Medical History:  Diagnosis Date  . Arthritis   . Diabetes mellitus without complication (HCC)    stopped DM meds 2004  . GERD (gastroesophageal reflux disease)   . Hypertension    meds stopped 1 week ago , need to get renewed    Past Surgical History:  Procedure Laterality Date  . CHOLECYSTECTOMY N/A 05/02/2014   Procedure: LAPAROSCOPIC CHOLECYSTECTOMY ;  Surgeon: Abigail Miyamoto, MD;  Location: [facility] OR;  Service: General;  Laterality: N/A;  laparoscopic cholecystectomy  . COLONOSCOPY    . NO PAST SURGERIES      Social History    Social History  . Marital status: Widowed    Spouse name: N/A  . Number of children: N/A  . Years of education: N/A   Occupational History  . Not on file.   Social History Main Topics  . Smoking status: Never Smoker  . Smokeless tobacco: Never Used  . Alcohol use No  . Drug use: No  . Sexual activity: Not on file   Other Topics Concern  . Not on file   Social History Narrative  . No narrative on file    No Known Allergies  No family history on file. Visit home with her son. Ambulates independently. Speaks no English Prior to Admission medications   Medication Sig Start Date End Date Taking? Authorizing Provider  mupirocin ointment (BACTROBAN) 2 % Apply 1 application topically 3 (three) times daily. 12/10/14   Dorna Leitz, PA-C    Physical Exam: Vitals:   12/15/15 0600 12/15/15 0630 12/15/15 0700 12/15/15 0730  BP: 125/69 117/72 122/61 122/69  Pulse: 81 79 77 78  Resp: 11 15 13 14   Temp:      TempSrc:      SpO2: 98% 98% 96% 97%  Weight:         General:  Appears calm and comfortable, quite thin and somewhat frail-appearing Eyes:  PERRL, EOMI, normal lids, iris ENT:  grossly normal hearing, lips & tongue, because membranes of her mouth are pink only slightly dry Neck:  no LAD, masses or thyromegaly Cardiovascular:  RRR, no m/r/g. No LE edema.  Respiratory:  CTA bilaterally, no w/r/r. Normal respiratory effort. Abdomen:  soft, ntnd, somewhat sluggish bowel sounds nontender to palpation Skin:  no rash or induration seen on limited exam Musculoskeletal:  grossly normal tone BUE/BLE, good ROM, no bony abnormality Psychiatric:  grossly normal mood and affect, speech fluent and appropriate, AOx3 Neurologic:  CN 2-12 grossly intact, moves all extremities in coordinated fashion, sensation intact follows commands moves all extremities  Labs on Admission: I have personally reviewed following labs and imaging studies  CBC:  Recent Labs Lab 12/15/15 0030  WBC  7.0  HGB 10.6*  HCT 32.4*  MCV 61.7*  PLT 134*   Basic Metabolic Panel:  Recent Labs Lab 12/15/15 0030  NA 126*  K 4.0  CL 91*  CO2 28  GLUCOSE 661*  BUN 22*  CREATININE 0.70  CALCIUM 9.2   GFR: CrCl cannot be calculated (Unknown ideal weight.). Liver Function Tests:  Recent Labs Lab 12/15/15 0030  AST 22  ALT 35  ALKPHOS 144*  BILITOT 0.8  PROT 6.8  ALBUMIN 3.3*   No results for input(s): LIPASE, AMYLASE in the last 168 hours. No results for input(s): AMMONIA in the last 168 hours. Coagulation Profile: No results for input(s): INR, PROTIME in the last 168 hours. Cardiac Enzymes:  Recent Labs Lab 12/15/15 0626  TROPONINI <0.03   BNP (last 3 results) No results for input(s): PROBNP in the last 8760 hours. HbA1C: No results for input(s): HGBA1C in the last 72 hours. CBG:  Recent Labs Lab 12/15/15 0349 12/15/15 0445 12/15/15 0625 12/15/15 0727  GLUCAP 587* 479* 265* 256*   Lipid Profile: No results for input(s): CHOL, HDL, LDLCALC, TRIG, CHOLHDL, LDLDIRECT in the last 72 hours. Thyroid Function Tests: No results for input(s): TSH, T4TOTAL, FREET4, T3FREE, THYROIDAB in the last 72 hours. Anemia Panel: No results for input(s): VITAMINB12, FOLATE, FERRITIN, TIBC, IRON, RETICCTPCT in the last 72 hours. Urine analysis:    Component Value Date/Time   COLORURINE YELLOW 12/15/2015 0529   APPEARANCEUR CLEAR 12/15/2015 0529   LABSPEC 1.034 (H) 12/15/2015 0529   PHURINE 6.0 12/15/2015 0529   GLUCOSEU >1000 (A) 12/15/2015 0529   HGBUR NEGATIVE 12/15/2015 0529   BILIRUBINUR NEGATIVE 12/15/2015 0529   KETONESUR NEGATIVE 12/15/2015 0529   PROTEINUR NEGATIVE 12/15/2015 0529   NITRITE NEGATIVE 12/15/2015 0529   LEUKOCYTESUR SMALL (A) 12/15/2015 0529    Creatinine Clearance: CrCl cannot be calculated (Unknown ideal weight.).  Sepsis Labs: (procalcitonin:4,lacticidven:4) )No results found for this or any previous visit (from the past 240  hour(s)).   Radiological Exams on Admission: No results found.  EKG: Pending  Assessment/Plan Principal Problem:   Hyperglycemia Active Problems:   Generalized weakness   Hyponatremia   Anemia   Nausea without vomiting   #1. Hyperglycemia. Likely related to noncompliance with medications. Unclear if she has PCP. Serum glucose 661. He was provided with IV fluids. CBG 265 2 hours later. No anion gap. Appears she's been on metformin in the past -Admit to telemetry -Continue IV fluids -Obtain a hemoglobin A1c -lanuts 5u daily -Sliding scale insulin for optimal control -Diabetes coordinator consult for education/evaluation of patient/family needs for improved management -monitor bmet closely  #2. Generalized weakness. Likely related to above. -Physical therapy consult  #3. Nausea without vomiting. Likely related to #1. -IV fluids as noted above -Clear liquid diet. Advance as tolerated -Zofran as needed  #4. Hyponatremia. Sodium 128 on admission. Related to #1. Expect will correct itself as #1 resolves -Bemet in 4 hours -IV fluids -Monitor  urine output -Recheck in the morning  #5. Anemia. Mild. Hemoglobin 10.6 on admission. No signs symptoms of active bleeding. Likely related to chronic disease -Monitor -Anemia panel -FOBT   DVT prophylaxis: scd  Code Status: full  Family Communication: son at bedside  Disposition Plan: home  Consults called: none  Admission status: obs    Toya Smothers M MD [facility]  If 7PM-7AM, please contact night-coverage [email or web address] Password [facility]  12/15/2015, 8:46 AM

## 2015-12-15 NOTE — ED Notes (Signed)
The pt is c/o bi-lateral foot pain since 2100  She is a diabetic her son reports that she has been here for about 5 years and does not have a doctor  No feet lesions

## 2015-12-15 NOTE — ED Notes (Signed)
Called report to [place], Charity fundraiser

## 2015-12-16 DIAGNOSIS — R739 Hyperglycemia, unspecified: Secondary | ICD-10-CM | POA: Diagnosis not present

## 2015-12-16 DIAGNOSIS — E1142 Type 2 diabetes mellitus with diabetic polyneuropathy: Secondary | ICD-10-CM | POA: Diagnosis not present

## 2015-12-16 DIAGNOSIS — M79673 Pain in unspecified foot: Secondary | ICD-10-CM | POA: Diagnosis not present

## 2015-12-16 DIAGNOSIS — E43 Unspecified severe protein-calorie malnutrition: Secondary | ICD-10-CM | POA: Insufficient documentation

## 2015-12-16 LAB — GLUCOSE, CAPILLARY
GLUCOSE-CAPILLARY: 232 mg/dL — AB (ref 65–99)
Glucose-Capillary: 121 mg/dL — ABNORMAL HIGH (ref 65–99)
Glucose-Capillary: 131 mg/dL — ABNORMAL HIGH (ref 65–99)
Glucose-Capillary: 138 mg/dL — ABNORMAL HIGH (ref 65–99)
Glucose-Capillary: 174 mg/dL — ABNORMAL HIGH (ref 65–99)
Glucose-Capillary: 247 mg/dL — ABNORMAL HIGH (ref 65–99)

## 2015-12-16 LAB — COMPREHENSIVE METABOLIC PANEL
ALBUMIN: 2.3 g/dL — AB (ref 3.5–5.0)
ALT: 32 U/L (ref 14–54)
ANION GAP: 3 — AB (ref 5–15)
AST: 27 U/L (ref 15–41)
Alkaline Phosphatase: 68 U/L (ref 38–126)
BILIRUBIN TOTAL: 0.6 mg/dL (ref 0.3–1.2)
BUN: 14 mg/dL (ref 6–20)
CO2: 26 mmol/L (ref 22–32)
Calcium: 8.1 mg/dL — ABNORMAL LOW (ref 8.9–10.3)
Chloride: 107 mmol/L (ref 101–111)
Creatinine, Ser: 0.52 mg/dL (ref 0.44–1.00)
GFR calc Af Amer: >60 mL/min (ref >60)
GFR calc non Af Amer: >60 mL/min (ref >60)
GLUCOSE: 156 mg/dL — AB (ref 65–99)
POTASSIUM: 3.5 mmol/L (ref 3.5–5.1)
SODIUM: 136 mmol/L (ref 135–145)
TOTAL PROTEIN: 4.8 g/dL — AB (ref 6.5–8.1)

## 2015-12-16 LAB — CBC
HEMATOCRIT: 28.6 % — AB (ref 36.0–46.0)
HEMOGLOBIN: 9.1 g/dL — AB (ref 12.0–15.0)
MCH: 20 pg — AB (ref 26.0–34.0)
MCHC: 31.8 g/dL (ref 30.0–36.0)
MCV: 62.9 fL — AB (ref 78.0–100.0)
Platelets: 109 10*3/uL — ABNORMAL LOW (ref 150–400)
RBC: 4.55 MIL/uL (ref 3.87–5.11)
RDW: 15.9 % — ABNORMAL HIGH (ref 11.5–15.5)
WBC: 6 10*3/uL (ref 4.0–10.5)

## 2015-12-16 LAB — HEMOGLOBIN A1C
Hgb A1c MFr Bld: 12.2 % — ABNORMAL HIGH (ref 4.8–5.6)
MEAN PLASMA GLUCOSE: 303 mg/dL

## 2015-12-16 MED ORDER — BLOOD GLUCOSE MONITOR KIT: PACK | 0 refills | Status: DC

## 2015-12-16 MED ORDER — INSULIN ASPART 100 UNIT/ML ~~LOC~~ SOLN
0.0000 [IU] | Freq: Three times a day (TID) | SUBCUTANEOUS | Status: DC
  Administered 2015-12-16: 3 [IU] via SUBCUTANEOUS

## 2015-12-16 MED ORDER — METFORMIN HCL 1000 MG PO TABS: 1000.0000 mg | ORAL_TABLET | Freq: Two times a day (BID) | ORAL | 1 refills | Status: DC

## 2015-12-16 MED ORDER — INSULIN GLARGINE 100 UNIT/ML ~~LOC~~ SOLN
7.0000 [IU] | Freq: Every day | SUBCUTANEOUS | Status: DC
  Administered 2015-12-16: 7 [IU] via SUBCUTANEOUS
  Filled 2015-12-16 (×2): qty 0.07

## 2015-12-16 MED ORDER — METFORMIN HCL 850 MG PO TABS
850.0000 mg | ORAL_TABLET | Freq: Two times a day (BID) | ORAL | Status: DC
  Administered 2015-12-16: 850 mg via ORAL
  Filled 2015-12-16: qty 1

## 2015-12-16 MED ORDER — METFORMIN HCL 500 MG PO TABS: 1000.0000 mg | ORAL_TABLET | Freq: Two times a day (BID) | ORAL | Status: DC

## 2015-12-16 MED ORDER — GABAPENTIN 300 MG PO CAPS: 300.0000 mg | ORAL_CAPSULE | Freq: Three times a day (TID) | ORAL | 0 refills | Status: DC

## 2015-12-16 MED ORDER — GLUCERNA SHAKE PO LIQD
237.0000 mL | Freq: Two times a day (BID) | ORAL | Status: DC
  Administered 2015-12-16: 237 mL via ORAL

## 2015-12-16 MED ORDER — INSULIN ASPART 100 UNIT/ML ~~LOC~~ SOLN: 0.0000 [IU] | Freq: Every day | SUBCUTANEOUS | Status: DC

## 2015-12-16 MED ORDER — GLUCERNA SHAKE PO LIQD: 237.0000 mL | Freq: Two times a day (BID) | ORAL | 0 refills | Status: DC

## 2015-12-16 NOTE — Care Management Note (Signed)
Case Management Note  Patient Details  Name: Olivia Williamson MRN: [id number] Date of Birth: 02-18-1946  Subjective/Objective:       Admitted with Hyperglycemia             Action/Plan: CM talked to patient with interpreter present; No PCP, pt is agreeable to the [facility] at discharge, also she can get her medication there. Private insurance with Medicare (pt has insurance and does not qualify for medication assistance). Diabetic Coordinator in room also to talk to patient about managing her blood glucose.  Expected Discharge Date:    12/16/2015              Expected Discharge Plan:   home with family  In-House Referral:   Diabetic Coordinator   Status of Service:   Completed  Reola Mosher [phone] 12/16/2015, 3:55 PM

## 2015-12-16 NOTE — Plan of Care (Signed)
Problem: Food- and Nutrition-Related Knowledge Deficit (NB-1.1) Goal: Nutrition education Formal process to instruct or train a patient/client in a skill or to impart knowledge to help patients/clients voluntarily manage or modify food choices and eating behavior to maintain or improve health. Outcome: Completed/Met Date Met: 12/16/15  RD consulted for nutrition education regarding diabetes.   Lab Results  Component Value Date   HGBA1C 12.2 (H) 12/15/2015    Interpreter used at bedside. RD provided "MyPlate Method Eating" handout to patient/family. Discussed different food groups and their effects on blood sugar, emphasizing carbohydrate-containing foods. Provided list of carbohydrates. Discussed importance of controlled and consistent carbohydrate intake throughout the day. Educated on adequate protein intake at meals. Examples of high protein foods were discussed. Diabetic friendly drink options were additionally discussed. Teach back method used.  Expect good compliance.  Corrin Parker, MS, RD, LDN Pager # [phone] After hours/ weekend pager # [phone]

## 2015-12-16 NOTE — Progress Notes (Signed)
Discharghed to home with family Instrucitons and prescriptions given .Interpreted instructions by son to pt

## 2015-12-16 NOTE — Progress Notes (Signed)
Initial Nutrition Assessment  DOCUMENTATION CODES:   Severe malnutrition in context of chronic illness, Underweight  INTERVENTION:  Provide Glucerna Shake po BID, each supplement provides 220 kcal and 10 grams of protein.  Encourage adequate PO intake.   Diet education given.   NUTRITION DIAGNOSIS:   Malnutrition related to chronic illness as evidenced by severe depletion of body fat, severe depletion of muscle mass.  GOAL:   Patient will meet greater than or equal to 90% of their needs  MONITOR:   PO intake, Supplement acceptance, I & O's, Labs  REASON FOR ASSESSMENT:   Consult Diet education  ASSESSMENT:   70 y.o. female with medical history significant for diabetes, hypertension, GERD, presents to the emergency department from home with the chief complaint of generalized weakness. Initial evaluation reveals hyperglycemia anemia  Diet has just been advanced. Meal completion 75%. Interpreter at bedside. Pt usually consumes 3 meals a day with some snacks in between. Pt usually consumes rice, noodles, or bread with vegetables at meals with milk, fish, or pork as protein sources. Fruit is usually eaten as snack. Pt reports weight loss, however amount of weight lost or usual body weight unknown. RD to order Glucerna shake to aid in caloric and protein needs. Diet education given.   Nutrition-Focused physical exam completed. Findings are severe fat depletion, severe muscle depletion, and no edema.   Labs and medications reviewed.   Diet Order:  Diet Carb Modified Fluid consistency: Thin; Room service appropriate? Yes  Skin:  Reviewed, no issues  Last BM:  7/22  Height:   Ht Readings from Last 1 Encounters:  12/15/15 4\' 11"  (1.499 m)    Weight:   Wt Readings from Last 1 Encounters:  12/16/15 78 lb 4.8 oz (35.5 kg)    Ideal Body Weight:  44.5 kg  BMI:  Body mass index is 15.81 kg/m.  Estimated Nutritional Needs:   Kcal:  1300-1500  Protein:  55-65  grams  Fluid:  >/= 1.5 L/day  EDUCATION NEEDS:   Education needs addressed  Roslyn Smiling, MS, RD, LDN Pager # [phone] After hours/ weekend pager # [phone]

## 2015-12-16 NOTE — Progress Notes (Signed)
1200 PT spoken to by Diabetic nurse coordinator , dietician, ,Case Management , MD , pt 's son with interpreter in hand. Verbalized understanding of the plan of care

## 2015-12-16 NOTE — Care Management Important Message (Signed)
Important Message  Patient Details  Name: Olivia Williamson MRN: [id number] Date of Birth: 10-11-1945   Medicare Important Message Given:  Yes    Bernadette Hoit 12/16/2015, 8:59 AM

## 2015-12-16 NOTE — Evaluation (Signed)
Physical Therapy Evaluation Patient Details Name: Olivia Williamson MRN: [id number] DOB: [birth year] Today's Date: 12/16/2015   History of Present Illness  Pt is a 70 y/o F with known history of DM and neuropathy supposed to be on metformin and gabapentin at home, ran out of her medication in March, and over the last month she has been feeling weaker, increased thirst, as well as weight loss.  She was found to be hyperglycemic with a sugar in the 600s, however not in DKA.    Clinical Impression  Pt admitted with above diagnosis. Pt currently with functional limitations due to the deficits listed below (see PT Problem List). PTA pt ambulating with cane but limited by Bil LE pain which sounds like diabetic neuropathy?  She currently requires min assist to steady for short distance ambulation.  Per her son (who served as interpreter this session) his two brothers will be able to provide 24/7 assist/supervision at d/c. Pt will benefit from skilled PT to increase their independence and safety with mobility to allow discharge to the venue listed below.      Follow Up Recommendations Home health PT;Supervision for mobility/OOB    Equipment Recommendations  Rolling walker with 5" wheels    Recommendations for Other Services OT consult     Precautions / Restrictions Precautions Precautions: Fall Precaution Comments: Will need interpreter.  Per son pt speaks Ade or [place]. Restrictions Weight Bearing Restrictions: No      Mobility  Bed Mobility Overal bed mobility: Needs Assistance Bed Mobility: Supine to Sit     Supine to sit: Min guard;HOB elevated     General bed mobility comments: Increased time and effort  Transfers Overall transfer level: Needs assistance Equipment used: None Transfers: Sit to/from [employer] Sit to Stand: Min assist Stand pivot transfers: Min assist       General transfer comment: 1 person HHA to steady during  transfers  Ambulation/Gait Ambulation/Gait assistance: Min assist Ambulation Distance (Feet): 40 Feet Assistive device: 1 person hand held assist Gait Pattern/deviations: Step-to pattern;Decreased stride length;Decreased step length - right;Decreased stance time - left;Antalgic;Trunk flexed Gait velocity: decreased   General Gait Details: Pt limping due to pain in feet (Lt>Rt) and requires 1 person HHA to steady to ambulate.  Flexed posture.  Stairs            Wheelchair Mobility    Modified Rankin (Stroke Patients Only)       Balance Overall balance assessment: Needs assistance Sitting-balance support: No upper extremity supported;Feet supported Sitting balance-Leahy Scale: Fair     Standing balance support: Single extremity supported;During functional activity Standing balance-Leahy Scale: Poor Standing balance comment: Relies on 1 person HHA to steady in static standing                             Pertinent Vitals/Pain Pain Assessment: Faces Faces Pain Scale: Hurts even more Pain Location: Bil LE from knees down (premorbid) (Reports numbness as well, sounds like neuropathy) Pain Descriptors / Indicators: Aching;Discomfort;Grimacing Pain Intervention(s): Limited activity within patient's tolerance;Monitored during session;Repositioned    Home Living Family/patient expects to be discharged to:: Private residence Living Arrangements: Children Available Help at Discharge: Family;Available 24 hours/day Type of Home: House Home Access: Stairs to enter Entrance Stairs-Rails: None Entrance Stairs-Number of Steps: 2 Home Layout: One level Home Equipment: Cane - single point Additional Comments: Has two sons who live with her.  One works night shift and the other works  day shift so between the two of them they should be able to provide 24/7 assist/supervision.    Prior Function Level of Independence: Needs assistance   Gait / Transfers Assistance Needed:  Pt ambulating with cane but with pain Bil feet for the past year  ADL's / Homemaking Assistance Needed: Sons do the driving.  Assume that they do the cooking, cleaning as well but unable to confirm with pt due to language barrier        Hand Dominance        Extremity/Trunk Assessment   Upper Extremity Assessment: Generalized weakness           Lower Extremity Assessment: Generalized weakness;RLE deficits/detail;LLE deficits/detail RLE Deficits / Details: pain from knees down (due to neuropathy?) LLE Deficits / Details: pain from knees down (due to neuropathy?)  Cervical / Trunk Assessment: Kyphotic  Communication   Communication: Prefers language other than English (Son interpreted today)  Cognition Arousal/Alertness: Awake/alert Behavior During Therapy: WFL for tasks assessed/performed Overall Cognitive Status:  (Son reports that pt seems to be at baseline)                      General Comments      Exercises        Assessment/Plan    PT Assessment Patient needs continued PT services  PT Diagnosis Difficulty walking;Abnormality of gait;Acute pain   PT Problem List Decreased strength;Decreased activity tolerance;Decreased balance;Decreased knowledge of use of DME;Decreased safety awareness;Pain  PT Treatment Interventions DME instruction;Gait training;Stair training;Functional mobility training;Therapeutic activities;Therapeutic exercise;Balance training;Patient/family education;Modalities   PT Goals (Current goals can be found in the Care Plan section) Acute Rehab PT Goals Patient Stated Goal: none stated PT Goal Formulation: With patient/family Time For Goal Achievement: 12/29/16 Potential to Achieve Goals: Good    Frequency Min 3X/week   Barriers to discharge        Co-evaluation               End of Session Equipment Utilized During Treatment: Gait belt Activity Tolerance: Patient limited by pain;Patient limited by fatigue Patient  left: in chair;with call bell/phone within reach;with chair alarm set;with family/visitor present Nurse Communication: Mobility status;Other (comment) (Bil LE pain)         Time: 1000-1018 PT Time Calculation (min) (ACUTE ONLY): 18 min   Charges:   PT Evaluation $PT Eval Low Complexity: 1 Procedure     PT G Codes:       Encarnacion Chu PT, DPT  Pager: [phone] Phone: [phone] 12/16/2015, 11:26 AM

## 2015-12-16 NOTE — Progress Notes (Signed)
Inpatient Diabetes Program Recommendations  AACE/ADA: New Consensus Statement on Inpatient Glycemic Control (2015)  Target Ranges:  Prepandial:   less than 140 mg/dL      Peak postprandial:   less than 180 mg/dL (1-2 hours)      Critically ill patients:  140 - 180 mg/dL   Lab Results  Component Value Date   GLUCAP 174 (H) 12/16/2015   HGBA1C 12.2 (H) 12/15/2015     Inpatient Diabetes Program Recommendations:     Spoke to the patient through medical interpreter.  Patient eats three meals per day with rice and vegetables at all meals.  She does like milk.  I have asked her to eat her three meals per day, eliminate the orange juice and lemonade and replace it with diet drinks (anything with 0 carbohydrates).  She can have rice, noddle or bread at each meal with a glass of milk if desired. She likes fruit.  Recommend 1 fruit or 1/2 cup between meals if desired.  The dietitian walked in as I was about to leave so further instructions will be given.  I have written the above information down for the patient's family.  Reviewed the side effects of Metformin and instructed to continue taking it even if she has gas, stomach upset or diarrhea to take medications with a meal (breakfast and supper). I discussed that the MD would order a glucometer and the son was willing to teach the mother how to use it- she should check fasting, 2 hours after lunch and 2 hours after supper- write it down and bring the numbers to the MD at the [facility] Wellness appointment.   MD please order a meter, strips and lancets- order # [id number]  Susette Racer, RN, BA, MHA, CDE Diabetes Coordinator Inpatient Diabetes Program  [phone] (Team Pager) [phone] [facility] Office) 12/16/2015 12:39 PM

## 2015-12-16 NOTE — Discharge Instructions (Signed)
Follow with [facility].   Get CBC, CMP,checked  by Primary MD next visit.    Activity: As tolerated with Full fall precautions use walker/cane & assistance as needed   Disposition Home    Diet: Carbohydrate modified , with feeding assistance and aspiration precautions.  For Heart failure patients - Check your Weight same time everyday, if you gain over 2 pounds, or you develop in leg swelling, experience more shortness of breath or chest pain, call your Primary MD immediately. Follow Cardiac Low Salt Diet and 1.5 lit/day fluid restriction.   On your next visit with your primary care physician please Get Medicines reviewed and adjusted.   Please request your Prim.MD to go over all Hospital Tests and Procedure/Radiological results at the follow up, please get all Hospital records sent to your Prim MD by signing hospital release before you go home.   If you experience worsening of your admission symptoms, develop shortness of breath, life threatening emergency, suicidal or homicidal thoughts you must seek medical attention immediately by calling 911 or calling your MD immediately  if symptoms less severe.  You Must read complete instructions/literature along with all the possible adverse reactions/side effects for all the Medicines you take and that have been prescribed to you. Take any new Medicines after you have completely understood and accpet all the possible adverse reactions/side effects.   Do not drive, operating heavy machinery, perform activities at heights, swimming or participation in water activities or provide baby sitting services if your were admitted for syncope or siezures until you have seen by Primary MD or a Neurologist and advised to do so again.  Do not drive when taking Pain medications.    Do not take more than prescribed Pain, Sleep and Anxiety Medications  Special Instructions: If you have smoked or chewed Tobacco  in the last 2 yrs please stop  smoking, stop any regular Alcohol  and or any Recreational drug use.  Wear Seat belts while driving.   Please note  You were cared for by a hospitalist during your hospital stay. If you have any questions about your discharge medications or the care you received while you were in the hospital after you are discharged, you can call the unit and asked to speak with the hospitalist on call if the hospitalist that took care of you is not available. Once you are discharged, your primary care physician will handle any further medical issues. Please note that NO REFILLS for any discharge medications will be authorized once you are discharged, as it is imperative that you return to your primary care physician (or establish a relationship with a primary care physician if you do not have one) for your aftercare needs so that they can reassess your need for medications and monitor your lab values.

## 2015-12-16 NOTE — Discharge Summary (Signed)
                                                                                  Olivia Williamson, is a 70 y.o. female  DOB 03/06/1946  MRN [id number].  Admission date:  12/15/2015  Admitting Physician  Timothy S Opyd, MD  Discharge Date:  12/16/2015   Primary MD  Pcp Not In System  Recommendations for primary care physician for things to follow:  - Please check CBC, BMP during next visit - Please monitor CBGs, and adjust oral hypoglycemic medications as needed.   Admission Diagnosis  Diabetes mellitus without complication (HCC) [E11.9] Essential hypertension [I10] Other specified diabetes mellitus with diabetic polyneuropathy (HCC) [E13.42] Gastroesophageal reflux disease, esophagitis presence not specified [K21.9]   Discharge Diagnosis  Diabetes mellitus without complication (HCC) [E11.9] Essential hypertension [I10] Other specified diabetes mellitus with diabetic polyneuropathy (HCC) [E13.42] Gastroesophageal reflux disease, esophagitis presence not specified [K21.9]    Principal Problem:   Hyperglycemia Active Problems:   Generalized weakness   Hyponatremia   Anemia   Nausea without vomiting   Protein-calorie malnutrition, severe      Past Medical History:  Diagnosis Date  . Arthritis   . Diabetes mellitus without complication (HCC)    stopped DM meds 2004  . GERD (gastroesophageal reflux disease)   . Hypertension    meds stopped 1 week ago , need to get renewed    Past Surgical History:  Procedure Laterality Date  . CHOLECYSTECTOMY N/A 05/02/2014   Procedure: LAPAROSCOPIC CHOLECYSTECTOMY ;  Surgeon: Douglas Blackman, MD;  Location: [facility] OR;  Service: General;  Laterality: N/A;  laparoscopic cholecystectomy  . COLONOSCOPY    . NO PAST SURGERIES         History of present illness and  Hospital Course:     Kindly see H&P for history of present illness and admission details, please review complete Labs, Consult reports and Test reports for all details in  brief  HPI  from the history and physical done on the day of admission 12/15/2015 HPI: Olivia Williamson is a 70 y.o. female with medical history significant for diabetes, hypertension, GERD, presents to the emergency department from home with the chief complaint of generalized weakness. Initial evaluation reveals hyperglycemia anemia    She speaks no English she speaks Hmong only,  so her son interpreting at bedside. He states she's been here for 2 months has not had any medications to take. He reports gradual worsening of generalized weakness progressing to the point of inability to ambulate. He also reports urinary frequency. In addition her oral intake has been much less. He denies nausea vomiting diarrhea. He denies any fever chills complaints of chest pain shortness of breath headache or abdominal pain. He is unaware of how patient manages her diabetes. Unsure if she has MD, supplies, monitors CBG's. He does report she complains of chronic foot pain.    ED Course: Emergency department she was noted to have elevated blood sugar without and an ion gap. She is afebrile hemodynamically stable and not hypoxic.    Hospital Course   Diabetes mellitus type 2,  uncontrolled with significant hyperglycemia - This is secondary to noncompliance,   patient does not have PCP, was discharged from [place] , in January on metformin, did not refill her metformin since March 2017, not in DKA, no anion gap, normal bicarbonate level, normal pH, required IV insulin glucose stabilizer overnight, tolerating oral intake, started on Lantus 1000 mg oral twice a day, diabetic coordinator consulted, discussed with patient and her son via interpreter at bedside, emphasized importance of compliance, given prescription for glucometer, lancets, and glucose strips and to follow with [facility], glycohemoglobin A1c is 12.2.  Severe Protein calorie malnutrition - Encouraged by mouth intake, encouraged to use  Glucerna  Diabetic neuropathy - Continue with gabapentin   Discharge Condition:  Stable   Follow UP  Follow-up Information    [employer]. Go on 12/30/2015.   Why:  @ 11:30AM Contact information: [address] [place] [address] [phone]            Discharge Instructions  and  Discharge Medications     Discharge Instructions    Discharge instructions    Complete by:  As directed   Follow with [facility].   Get CBC, CMP,checked  by Primary MD next visit.    Activity: As tolerated with Full fall precautions use walker/cane & assistance as needed   Disposition Home    Diet: Carbohydrate modified , with feeding assistance and aspiration precautions.  For Heart failure patients - Check your Weight same time everyday, if you gain over 2 pounds, or you develop in leg swelling, experience more shortness of breath or chest pain, call your Primary MD immediately. Follow Cardiac Low Salt Diet and 1.5 lit/day fluid restriction.   On your next visit with your primary care physician please Get Medicines reviewed and adjusted.   Please request your Prim.MD to go over all Hospital Tests and Procedure/Radiological results at the follow up, please get all Hospital records sent to your Prim MD by signing hospital release before you go home.   If you experience worsening of your admission symptoms, develop shortness of breath, life threatening emergency, suicidal or homicidal thoughts you must seek medical attention immediately by calling 911 or calling your MD immediately  if symptoms less severe.  You Must read complete instructions/literature along with all the possible adverse reactions/side effects for all the Medicines you take and that have been prescribed to you. Take any new Medicines after you have completely understood and accpet all the possible adverse reactions/side effects.   Do not drive, operating  heavy machinery, perform activities at heights, swimming or participation in water activities or provide baby sitting services if your were admitted for syncope or siezures until you have seen by Primary MD or a Neurologist and advised to do so again.  Do not drive when taking Pain medications.    Do not take more than prescribed Pain, Sleep and Anxiety Medications  Special Instructions: If you have smoked or chewed Tobacco  in the last 2 yrs please stop smoking, stop any regular Alcohol  and or any Recreational drug use.  Wear Seat belts while driving.   Please note  You were cared for by a hospitalist during your hospital stay. If you have any questions about your discharge medications or the care you received while you were in the hospital after you are discharged, you can call the unit and asked to speak with the hospitalist on call if the hospitalist that took care of you is not available. Once you are discharged,  your primary care physician will handle any further medical issues. Please note that NO REFILLS for any discharge medications will be authorized once you are discharged, as it is imperative that you return to your primary care physician (or establish a relationship with a primary care physician if you do not have one) for your aftercare needs so that they can reassess your need for medications and monitor your lab values.   Increase activity slowly    Complete by:  As directed       Medication List    TAKE these medications   blood glucose meter kit and supplies Kit Dispense based on patient and insurance preference. Use up to four times daily as directed. (FOR ICD-9 250.00, 250.01).   feeding supplement (GLUCERNA SHAKE) Liqd Take 237 mLs by mouth 2 (two) times daily between meals.   gabapentin 300 MG capsule Commonly known as:  NEURONTIN Take 1 capsule (300 mg total) by mouth 3 (three) times daily.   metFORMIN 1000 MG tablet Commonly known as:  GLUCOPHAGE Take 1  tablet (1,000 mg total) by mouth 2 (two) times daily with a meal. Start taking on:  12/17/2015         Diet and Activity recommendation: See Discharge Instructions above   Consults obtained -  None   Major procedures and Radiology Reports - PLEASE review detailed and final reports for all details, in brief -      No results found.  Micro Results     Recent Results (from the past 240 hour(s))  Urine culture     Status: Abnormal (Preliminary result)   Collection Time: 12/15/15  5:29 AM  Result Value Ref Range Status   Specimen Description URINE, CLEAN CATCH  Final   Special Requests NONE  Final   Culture >=100,000 COLONIES/mL GRAM NEGATIVE RODS (A)  Final   Report Status PENDING  Incomplete       Today   Subjective:   Searra Basic today has no headache,no chest or abdominal pain, tolerating by mouth intake  Objective:   Blood pressure (!) 132/45, pulse 93, temperature 98.3 F (36.8 C), temperature source Oral, resp. rate 16, height 4' 11" (1.499 m), weight 35.5 kg (78 lb 4.8 oz), SpO2 100 %.   Intake/Output Summary (Last 24 hours) at 12/16/15 1641 Last data filed at 12/16/15 1400  Gross per 24 hour  Intake              120 ml  Output              800 ml  Net             -680 ml    Exam Awake Alert, Oriented x 3 , frail. Supple Neck,No JVD,   Symmetrical Chest wall movement, Good air movement bilaterally, CTAB RRR,No Gallops,Rubs or new Murmurs, No Parasternal Heave +ve B.Sounds, Abd Soft, Non tender. No Cyanosis, Clubbing or edema, No new Rash or bruise  Data Review   CBC w Diff: Lab Results  Component Value Date   WBC 6.0 12/16/2015   HGB 9.1 (L) 12/16/2015   HCT 28.6 (L) 12/16/2015   PLT 109 (L) 12/16/2015   LYMPHOPCT 4 (L) 05/03/2014   MONOPCT 3 05/03/2014   EOSPCT 0 05/03/2014   BASOPCT 0 05/03/2014    CMP: Lab Results  Component Value Date   NA 136 12/16/2015   K 3.5 12/16/2015   CL 107 12/16/2015   CO2 26 12/16/2015   BUN 14  12/16/2015     CREATININE 0.52 12/16/2015   PROT 4.8 (L) 12/16/2015   ALBUMIN 2.3 (L) 12/16/2015   BILITOT 0.6 12/16/2015   ALKPHOS 68 12/16/2015   AST 27 12/16/2015   ALT 32 12/16/2015  .   Total Time in preparing paper work, data evaluation and todays exam - 35 minutes  Javis Abboud M.D on 12/16/2015 at 4:41 PM  [facility]   Office  [phone]

## 2015-12-16 NOTE — Progress Notes (Addendum)
Inpatient Diabetes Program Recommendations  AACE/ADA: New Consensus Statement on Inpatient Glycemic Control (2015)  Target Ranges:  Prepandial:   less than 140 mg/dL      Peak postprandial:   less than 180 mg/dL (1-2 hours)      Critically ill patients:  140 - 180 mg/dL   Lab Results  Component Value Date   GLUCAP 174 (H) 12/16/2015   HGBA1C 12.2 (H) 12/15/2015    Inpatient Diabetes Program Recommendations:  I met briefly with the son and patient.  Patient speaks no [place].  Son could provide no assistance with answering questions- mom had been living with his sister in [place] and has recently moved to [place] and lives his brother.  I have called interpreter services to arrange for a medical interpreter- they will notify me when one becomes available.  I have notified the RN Deneise Lever that we are awaiting interpreter services call back.   Gentry Fitz, RN, BA, MHA, CDE Diabetes Coordinator Inpatient Diabetes Program  [phone] (Team Pager) [phone] ([place]) 12/16/2015 11:02 AM

## 2015-12-16 NOTE — Progress Notes (Signed)
Dr Florene Route a ware of latest CBG and insulin coverage given. OK TO DISCHARGE PT . FAMILY AND PT AWARE. PT ABLE TO SELF administer insulin with moderate effciency

## 2015-12-16 NOTE — Progress Notes (Addendum)
Inpatient Diabetes Program Recommendations  AACE/ADA: New Consensus Statement on Inpatient Glycemic Control (2015)  Target Ranges:  Prepandial:   less than 140 mg/dL      Peak postprandial:   less than 180 mg/dL (1-2 hours)      Critically ill patients:  140 - 180 mg/dL   Lab Results  Component Value Date   GLUCAP 174 (H) 12/16/2015   HGBA1C 12.2 (H) 12/15/2015    Review of Glycemic Control  Results for Olivia Williamson, [facility] (MRN [id number]) as of 12/16/2015 10:20  Ref. Range 12/15/2015 22:00 12/15/2015 23:01 12/16/2015 00:09 12/16/2015 01:06 12/16/2015 01:55 12/16/2015 03:06 12/16/2015 05:59  Glucose-Capillary Latest Ref Range: 65 - 99 mg/dL 338 (H) 81 329 (H) 191 (H) 138 (H)  174 (H)    Admit with: Hyperlgycemia  History: DM  Home DM Meds: None for questionable amount of time (has taken Metformin in the past)  Current Insulin Orders:Lantus 7 units qday,   Please consider ordering Novolog 0-9 units tid    Susette Racer, RN, [place], [place], CDE Diabetes Coordinator Inpatient Diabetes Program  [phone] (Team Pager) [phone] [facility] Office) 12/16/2015 10:20 AM

## 2015-12-17 LAB — URINE CULTURE

## 2015-12-18 ENCOUNTER — Telehealth (HOSPITAL_BASED_OUTPATIENT_CLINIC_OR_DEPARTMENT_OTHER): Payer: Self-pay | Admitting: Emergency Medicine

## 2015-12-18 NOTE — Telephone Encounter (Signed)
Post ED Visit - Positive Culture Follow-up: Successful Patient Follow-Up  Culture assessed and recommendations reviewed by: []  Enzo Bi, Pharm.D. []  Celedonio Miyamoto, Pharm.D., BCPS []  Garvin Fila, Pharm.D. []  Georgina Pillion, Pharm.D., BCPS []  Daniel, [address].D., BCPS, AAHIVP []  Estella Husk, Pharm.D., BCPS, AAHIVP []  Tennis Must, [address].D. []  Sherle Poe, [address].D.  Positive urine culture  [x]  Patient discharged without antimicrobial prescription and treatment is now indicated []  Organism is resistant to prescribed ED discharge antimicrobial []  Patient with positive blood cultures  Changes discussed with provider: Toya Smothers NP New antibiotic prescription Keflex 500mg  po tid x 7 days       #21 no refills  Attempting to contact patient, left message for son to return call, language barrier   Olivia Williamson 12/18/2015, 9:47 AM

## 2015-12-30 ENCOUNTER — Ambulatory Visit: Payer: Medicare Other | Attending: Internal Medicine | Admitting: Internal Medicine

## 2015-12-30 VITALS — BP 140/83 | HR 83 | Temp 98.2°F | Resp 16 | Wt 77.2 lb

## 2015-12-30 DIAGNOSIS — Z1329 Encounter for screening for other suspected endocrine disorder: Secondary | ICD-10-CM | POA: Diagnosis not present

## 2015-12-30 DIAGNOSIS — I10 Essential (primary) hypertension: Secondary | ICD-10-CM | POA: Diagnosis not present

## 2015-12-30 DIAGNOSIS — Z23 Encounter for immunization: Secondary | ICD-10-CM | POA: Diagnosis not present

## 2015-12-30 DIAGNOSIS — D649 Anemia, unspecified: Secondary | ICD-10-CM

## 2015-12-30 DIAGNOSIS — K219 Gastro-esophageal reflux disease without esophagitis: Secondary | ICD-10-CM | POA: Insufficient documentation

## 2015-12-30 DIAGNOSIS — E1142 Type 2 diabetes mellitus with diabetic polyneuropathy: Secondary | ICD-10-CM | POA: Diagnosis present

## 2015-12-30 DIAGNOSIS — E559 Vitamin D deficiency, unspecified: Secondary | ICD-10-CM

## 2015-12-30 DIAGNOSIS — R200 Anesthesia of skin: Secondary | ICD-10-CM | POA: Diagnosis not present

## 2015-12-30 DIAGNOSIS — E43 Unspecified severe protein-calorie malnutrition: Secondary | ICD-10-CM | POA: Diagnosis not present

## 2015-12-30 DIAGNOSIS — M1990 Unspecified osteoarthritis, unspecified site: Secondary | ICD-10-CM | POA: Insufficient documentation

## 2015-12-30 LAB — CBC WITH DIFFERENTIAL/PLATELET
Basophils Absolute: 0 cells/uL (ref 0–200)
Basophils Relative: 0 %
EOS ABS: 53 {cells}/uL (ref 15–500)
Eosinophils Relative: 1 %
HEMATOCRIT: 31.9 % — AB (ref 35.0–45.0)
Hemoglobin: 10 g/dL — ABNORMAL LOW (ref 11.7–15.5)
LYMPHS PCT: 20 %
Lymphs Abs: 1060 cells/uL (ref 850–3900)
MCH: 20.4 pg — ABNORMAL LOW (ref 27.0–33.0)
MCHC: 31.3 g/dL — AB (ref 32.0–36.0)
MCV: 65 fL — AB (ref 80.0–100.0)
MONO ABS: 477 {cells}/uL (ref 200–950)
MONOS PCT: 9 %
NEUTROS ABS: 3710 {cells}/uL (ref 1500–7800)
Neutrophils Relative %: 70 %
PLATELETS: 177 10*3/uL (ref 140–400)
RBC: 4.91 MIL/uL (ref 3.80–5.10)
RDW: 17.1 % — AB (ref 11.0–15.0)
WBC: 5.3 10*3/uL (ref 3.8–10.8)

## 2015-12-30 LAB — BASIC METABOLIC PANEL WITH GFR
BUN: 22 mg/dL (ref 7–25)
CHLORIDE: 97 mmol/L — AB (ref 98–110)
CO2: 29 mmol/L (ref 20–31)
Calcium: 9.3 mg/dL (ref 8.6–10.4)
Creat: 0.5 mg/dL (ref 0.50–0.99)
GFR, Est African American: >89 mL/min (ref >=60)
Glucose, Bld: 311 mg/dL — ABNORMAL HIGH (ref 65–99)
Potassium: 4.3 mmol/L (ref 3.5–5.3)
SODIUM: 134 mmol/L — AB (ref 135–146)

## 2015-12-30 LAB — TSH: TSH: 1.4 mIU/L

## 2015-12-30 MED ORDER — ACCU-CHEK SOFTCLIX LANCET DEV MISC: 11 refills | Status: DC

## 2015-12-30 MED ORDER — GLUCOSE BLOOD VI STRP: ORAL_STRIP | 12 refills | Status: DC

## 2015-12-30 MED ORDER — FERROUS GLUCONATE 324 (37.5 FE) MG PO TABS: 1.0000 | ORAL_TABLET | Freq: Every day | ORAL | 2 refills | Status: DC

## 2015-12-30 MED ORDER — INSULIN GLARGINE 100 UNIT/ML SOLOSTAR PEN: 20.0000 [IU] | PEN_INJECTOR | Freq: Every day | SUBCUTANEOUS | 3 refills | Status: DC

## 2015-12-30 MED ORDER — INSULIN PEN NEEDLE 32G X 4 MM MISC: 1.0000 | Freq: Every day | 2 refills | Status: DC

## 2015-12-30 MED ORDER — GABAPENTIN 300 MG PO CAPS: 600.0000 mg | ORAL_CAPSULE | Freq: Three times a day (TID) | ORAL | 2 refills | Status: DC

## 2015-12-30 MED ORDER — METFORMIN HCL 1000 MG PO TABS: 1000.0000 mg | ORAL_TABLET | Freq: Two times a day (BID) | ORAL | 1 refills | Status: DC

## 2015-12-30 NOTE — Patient Instructions (Addendum)
* f/u w/ Misty [facility] pharm 2 wks - dm chk, please bring sugar book   Pneumococcal Conjugate Vaccine (PCV13)  1. Why get vaccinated? Vaccination can protect both children and adults from pneumococcal disease. Pneumococcal disease is caused by bacteria that can spread from person to person through close contact. It can cause ear infections, and it can also lead to more serious infections of the:  Lungs (pneumonia),  Blood (bacteremia), and  Covering of the brain and spinal cord (meningitis). Pneumococcal pneumonia is most common among adults. Pneumococcal meningitis can cause deafness and brain damage, and it kills about 1 child in 10 who get it. Anyone can get pneumococcal disease, but children under 90 years of age and adults 71 years and older, people with certain medical conditions, and cigarette smokers are at the highest risk. Before there was a vaccine, the [place] saw:  more than 700 cases of meningitis,  about 13,000 blood infections,  about 5 million ear infections, and  about 200 deaths in children under 5 each year from pneumococcal disease. Since vaccine became available, severe pneumococcal disease in these children has fallen by 88%. About 18,000 older adults die of pneumococcal disease each year in the [place]. Treatment of pneumococcal infections with penicillin and other drugs is not as effective as it used to be, because some strains of the disease have become resistant to these drugs. This makes prevention of the disease, through vaccination, even more important. 2. PCV13 vaccine Pneumococcal conjugate vaccine (called PCV13) protects against 13 types of pneumococcal bacteria. PCV13 is routinely given to children at 2, 4, 6, and 96-79 months of age. It is also recommended for children and adults 30 to 79 years of age with certain health conditions, and for all adults 60 years of age and older. Your doctor can give you details. 3. Some people should not get this  vaccine Anyone who has ever had a life-threatening allergic reaction to a dose of this vaccine, to an earlier pneumococcal vaccine called PCV7, or to any vaccine containing diphtheria toxoid (for example, DTaP), should not get PCV13. Anyone with a severe allergy to any component of PCV13 should not get the vaccine. Tell your doctor if the person being vaccinated has any severe allergies. If the person scheduled for vaccination is not feeling well, your healthcare provider might decide to reschedule the shot on another day. 4. Risks of a vaccine reaction With any medicine, including vaccines, there is a chance of reactions. These are usually mild and go away on their own, but serious reactions are also possible. Problems reported following PCV13 varied by age and dose in the series. The most common problems reported among children were:  About half became drowsy after the shot, had a temporary loss of appetite, or had redness or tenderness where the shot was given.  About 1 out of 3 had swelling where the shot was given.  About 1 out of 3 had a mild fever, and about 1 in 20 had a fever over 102.64F.  Up to about 8 out of 10 became fussy or irritable. Adults have reported pain, redness, and swelling where the shot was given; also mild fever, fatigue, headache, chills, or muscle pain. Young children who get PCV13 along with inactivated flu vaccine at the same time may be at increased risk for seizures caused by fever. Ask your doctor for more information. Problems that could happen after any vaccine:  People sometimes faint after a medical procedure, including vaccination. Sitting or  lying down for about 15 minutes can help prevent fainting, and injuries caused by a fall. Tell your doctor if you feel dizzy, or have vision changes or ringing in the ears.  Some older children and adults get severe pain in the shoulder and have difficulty moving the arm where a shot was given. This happens very  rarely.  Any medication can cause a severe allergic reaction. Such reactions from a vaccine are very rare, estimated at about 1 in a million doses, and would happen within a few minutes to a few hours after the vaccination. As with any medicine, there is a very small chance of a vaccine causing a serious injury or death. The safety of vaccines is always being monitored. For more information, visit: http://floyd.org/ 5. What if there is a serious reaction? What should I look for?  Look for anything that concerns you, such as signs of a severe allergic reaction, very high fever, or unusual behavior. Signs of a severe allergic reaction can include hives, swelling of the face and throat, difficulty breathing, a fast heartbeat, dizziness, and weakness-usually within a few minutes to a few hours after the vaccination. What should I do?  If you think it is a severe allergic reaction or other emergency that can't wait, call 9-1-1 or get the person to the nearest hospital. Otherwise, call your doctor. Reactions should be reported to the Vaccine Adverse Event Reporting System (VAERS). Your doctor should file this report, or you can do it yourself through the VAERS web site at www.vaers.LAgents.no, or by calling 1-(509)400-8527. VAERS does not give medical advice. 6. The National Vaccine Injury Compensation Program The [employer] Vaccine Injury Compensation Program (VICP) is a federal program that was created to compensate people who may have been injured by certain vaccines. Persons who believe they may have been injured by a vaccine can learn about the program and about filing a claim by calling 1-3400815684 or visiting the VICP website at SpiritualWord.at. There is a time limit to file a claim for compensation. 7. How can I learn more?  Ask your healthcare provider. He or she can give you the vaccine package insert or suggest other sources of information.  Call your local or state  health department.  Contact the Centers for Disease Control and Prevention (CDC):  Call 306-362-9893 (1-800-CDC-INFO) or  Visit CDC's website at PicCapture.uy Vaccine Information Statement PCV13 Vaccine (03/29/2014)   This information is not intended to replace advice given to you by your health care provider. Make sure you discuss any questions you have with your health care provider.   Document Released: 03/08/2006 Document Revised: 06/01/2014 Document Reviewed: 04/05/2014 Elsevier Interactive Patient Education 2016 [employer]. Tdap Vaccine (Tetanus, Diphtheria and Pertussis): What You Need to Know 1. Why get vaccinated? Tetanus, diphtheria and pertussis are very serious diseases. Tdap vaccine can protect [place] from these diseases. And, Tdap vaccine given to pregnant women can protect newborn babies against pertussis. TETANUS (Lockjaw) is rare in the [place] today. It causes painful muscle tightening and stiffness, usually all over the body.  It can lead to tightening of muscles in the head and neck so you can't open your mouth, swallow, or sometimes even breathe. Tetanus kills about 1 out of 10 people who are infected even after receiving the best medical care. DIPHTHERIA is also rare in the [place] today. It can cause a thick coating to form in the back of the throat.  It can lead to breathing problems, heart failure, paralysis,  and death. PERTUSSIS (Whooping Cough) causes severe coughing spells, which can cause difficulty breathing, vomiting and disturbed sleep.  It can also lead to weight loss, incontinence, and rib fractures. Up to 2 in 100 adolescents and 5 in 100 adults with pertussis are hospitalized or have complications, which could include pneumonia or death. These diseases are caused by bacteria. Diphtheria and pertussis are spread from person to person through secretions from coughing or sneezing. Tetanus enters the body through cuts, scratches, or  wounds. Before vaccines, as many as 200,000 cases of diphtheria, 200,000 cases of pertussis, and hundreds of cases of tetanus, were reported in the [place] each year. Since vaccination began, reports of cases for tetanus and diphtheria have dropped by about 99% and for pertussis by about 80%. 2. Tdap vaccine Tdap vaccine can protect adolescents and adults from tetanus, diphtheria, and pertussis. One dose of Tdap is routinely given at age 22 or 74. People who did not get Tdap at that age should get it as soon as possible. Tdap is especially important for healthcare professionals and anyone having close contact with a baby younger than 12 months. Pregnant women should get a dose of Tdap during every pregnancy, to protect the newborn from pertussis. Infants are most at risk for severe, life-threatening complications from pertussis. Another vaccine, called Td, protects against tetanus and diphtheria, but not pertussis. A Td booster should be given every 10 years. Tdap may be given as one of these boosters if you have never gotten Tdap before. Tdap may also be given after a severe cut or burn to prevent tetanus infection. Your doctor or the person giving you the vaccine can give you more information. Tdap may safely be given at the same time as other vaccines. 3. Some people should not get this vaccine  A person who has ever had a life-threatening allergic reaction after a previous dose of any diphtheria, tetanus or pertussis containing vaccine, OR has a severe allergy to any part of this vaccine, should not get Tdap vaccine. Tell the person giving the vaccine about any severe allergies.  Anyone who had coma or long repeated seizures within 7 days after a childhood dose of DTP or DTaP, or a previous dose of Tdap, should not get Tdap, unless a cause other than the vaccine was found. They can still get Td.  Talk to your doctor if you:  have seizures or another nervous system problem,  had severe  pain or swelling after any vaccine containing diphtheria, tetanus or pertussis,  ever had a condition called Guillain-Barr Syndrome (GBS),  aren't feeling well on the day the shot is scheduled. 4. Risks With any medicine, including vaccines, there is a chance of side effects. These are usually mild and go away on their own. Serious reactions are also possible but are rare. Most people who get Tdap vaccine do not have any problems with it. Mild problems following Tdap (Did not interfere with activities)  Pain where the shot was given (about 3 in 4 adolescents or 2 in 3 adults)  Redness or swelling where the shot was given (about 1 person in 5)  Mild fever of at least 100.11F (up to about 1 in 25 adolescents or 1 in 100 adults)  Headache (about 3 or 4 people in 10)  Tiredness (about 1 person in 3 or 4)  Nausea, vomiting, diarrhea, stomach ache (up to 1 in 4 adolescents or 1 in 10 adults)  Chills, sore joints (about 1 person in 10)  Body aches (about 1 person in 3 or 4)  Rash, swollen glands (uncommon) Moderate problems following Tdap (Interfered with activities, but did not require medical attention)  Pain where the shot was given (up to 1 in 5 or 6)  Redness or swelling where the shot was given (up to about 1 in 16 adolescents or 1 in 12 adults)  Fever over 102F (about 1 in 100 adolescents or 1 in 250 adults)  Headache (about 1 in 7 adolescents or 1 in 10 adults)  Nausea, vomiting, diarrhea, stomach ache (up to 1 or 3 people in 100)  Swelling of the entire arm where the shot was given (up to about 1 in 500). Severe problems following Tdap (Unable to perform usual activities; required medical attention)  Swelling, severe pain, bleeding and redness in the arm where the shot was given (rare). Problems that could happen after any vaccine:  People sometimes faint after a medical procedure, including vaccination. Sitting or lying down for about 15 minutes can help prevent  fainting, and injuries caused by a fall. Tell your doctor if you feel dizzy, or have vision changes or ringing in the ears.  Some people get severe pain in the shoulder and have difficulty moving the arm where a shot was given. This happens very rarely.  Any medication can cause a severe allergic reaction. Such reactions from a vaccine are very rare, estimated at fewer than 1 in a million doses, and would happen within a few minutes to a few hours after the vaccination. As with any medicine, there is a very remote chance of a vaccine causing a serious injury or death. The safety of vaccines is always being monitored. For more information, visit: http://floyd.org/ 5. What if there is a serious problem? What should I look for?  Look for anything that concerns you, such as signs of a severe allergic reaction, very high fever, or unusual behavior.  Signs of a severe allergic reaction can include hives, swelling of the face and throat, difficulty breathing, a fast heartbeat, dizziness, and weakness. These would usually start a few minutes to a few hours after the vaccination. What should I do?  If you think it is a severe allergic reaction or other emergency that can't wait, call 9-1-1 or get the person to the nearest hospital. Otherwise, call your doctor.  Afterward, the reaction should be reported to the Vaccine Adverse Event Reporting System (VAERS). Your doctor might file this report, or you can do it yourself through the VAERS web site at www.vaers.LAgents.no, or by calling 1-(240)332-7085. VAERS does not give medical advice.  6. The National Vaccine Injury Compensation Program The [employer] Vaccine Injury Compensation Program (VICP) is a federal program that was created to compensate people who may have been injured by certain vaccines. Persons who believe they may have been injured by a vaccine can learn about the program and about filing a claim by calling 1-(514) 341-0802 or visiting the  VICP website at SpiritualWord.at. There is a time limit to file a claim for compensation. 7. How can I learn more?  Ask your doctor. He or she can give you the vaccine package insert or suggest other sources of information.  Call your local or state health department.  Contact the Centers for Disease Control and Prevention (CDC):  Call 650 207 9942 (1-800-CDC-INFO) or  Visit CDC's website at PicCapture.uy CDC Tdap Vaccine VIS (07/18/13)   This information is not intended to replace advice given to you by your health care provider. Make  sure you discuss any questions you have with your health care provider.   Document Released: 11/10/2011 Document Revised: 06/01/2014 Document Reviewed: 08/23/2013 Elsevier Interactive Patient Education 2016 Else[employer] Inc.   - Diabetes Mellitus and Food It is important for you to manage your blood sugar (glucose) level. Your blood glucose level can be greatly affected by what you eat. Eating healthier foods in the appropriate amounts throughout the day at about the same time each day will help you control your blood glucose level. It can also help slow or prevent worsening of your diabetes mellitus. Healthy eating may even help you improve the level of your blood pressure and reach or maintain a healthy weight.  General recommendations for healthful eating and cooking habits include:  Eating meals and snacks regularly. Avoid going long periods of time without eating to lose weight.  Eating a diet that consists mainly of plant-based foods, such as fruits, vegetables, nuts, legumes, and whole grains.  Using low-heat cooking methods, such as baking, instead of high-heat cooking methods, such as deep frying. Work with your dietitian to make sure you understand how to use the Nutrition Facts information on food labels. HOW CAN FOOD AFFECT ME? Carbohydrates Carbohydrates affect your blood glucose level more than any other type of food.  Your dietitian will help you determine how many carbohydrates to eat at each meal and teach you how to count carbohydrates. Counting carbohydrates is important to keep your blood glucose at a healthy level, especially if you are using insulin or taking certain medicines for diabetes mellitus. Alcohol Alcohol can cause sudden decreases in blood glucose (hypoglycemia), especially if you use insulin or take certain medicines for diabetes mellitus. Hypoglycemia can be a life-threatening condition. Symptoms of hypoglycemia (sleepiness, dizziness, and disorientation) are similar to symptoms of having too much alcohol.  If your health care provider has given you approval to drink alcohol, do so in moderation and use the following guidelines:  Women should not have more than one drink per day, and men should not have more than two drinks per day. One drink is equal to:  12 oz of beer.  5 oz of wine.  1 oz of hard liquor.  Do not drink on an empty stomach.  Keep yourself hydrated. Have water, diet soda, or unsweetened iced tea.  Regular soda, juice, and other mixers might contain a lot of carbohydrates and should be counted. WHAT FOODS ARE NOT RECOMMENDED? As you make food choices, it is important to remember that all foods are not the same. Some foods have fewer nutrients per serving than other foods, even though they might have the same number of calories or carbohydrates. It is difficult to get your body what it needs when you eat foods with fewer nutrients. Examples of foods that you should avoid that are high in calories and carbohydrates but low in nutrients include:  Trans fats (most processed foods list trans fats on the Nutrition Facts label).  Regular soda.  Juice.  Candy.  Sweets, such as cake, pie, doughnuts, and cookies.  Fried foods. WHAT FOODS CAN I EAT? Eat nutrient-rich foods, which will nourish your body and keep you healthy. The food you should eat also will depend on  several factors, including:  The calories you need.  The medicines you take.  Your weight.  Your blood glucose level.  Your blood pressure level.  Your cholesterol level. You should eat a variety of foods, including:  Protein.  Lean cuts of meat.  Proteins  low in saturated fats, such as fish, egg whites, and beans. Avoid processed meats.  Fruits and vegetables.  Fruits and vegetables that may help control blood glucose levels, such as apples, mangoes, and yams.  Dairy products.  Choose fat-free or low-fat dairy products, such as milk, yogurt, and cheese.  Grains, bread, pasta, and rice.  Choose whole grain products, such as multigrain bread, whole oats, and brown rice. These foods may help control blood pressure.  Fats.  Foods containing healthful fats, such as nuts, avocado, olive oil, canola oil, and fish. DOES EVERYONE WITH DIABETES MELLITUS HAVE THE SAME MEAL PLAN? Because every person with diabetes mellitus is different, there is not one meal plan that works for everyone. It is very important that you meet with a dietitian who will help you create a meal plan that is just right for you.   This information is not intended to replace advice given to you by your health care provider. Make sure you discuss any questions you have with your health care provider.   Document Released: 02/05/2005 Document Revised: 06/01/2014 Document Reviewed: 04/07/2013 Elsevier Interactive Patient Education [employer]   -

## 2015-12-30 NOTE — Progress Notes (Signed)
Olivia Williamson, is a 70 y.o. female  NOI:[id number]  QXI:[id number]  DOB - 01/20/1946  CC:  Chief Complaint  Patient presents with  . Diabetes  . Hypertension       HPI: Olivia Williamson is a 70 y.o. female here today to establish medical care, sp recent hospitalization 7/23-24/17 for gen weakness, off meds since March 2017.  Pt found to have uncontrolled DM w/ polyneuropathy, severe protein calorie malnutrition.  She states since d/c, she is doing bit better, eating slightly more, and taking all meds.  She c/o of significant le numbness and burning pain still, despite neurontin 365m po tid.  Patient has No headache, No chest pain, No abdominal pain - No Nausea, No new weakness tingling or numbness, No Cough - SOB.  Pt brought in her CBG book, sugars running 300-600s. Per family, she is running out glucometer supplies, but they do not know which machine she has.  She is here w/ her son.  Interpreter (female, present today) was used to communicate directly with patient for the entire encounter including providing detailed patient instructions.    Review of Systems: Per hpi, o/w all systems reviewed and negative.   No Known Allergies Past Medical History:  Diagnosis Date  . Arthritis   . Diabetes mellitus without complication [place]    stopped DM meds 2004  . GERD (gastroesophageal reflux disease)   . Hypertension    meds stopped 1 week ago , need to get renewed   Current Outpatient Prescriptions on File Prior to Visit  Medication Sig Dispense Refill  . blood glucose meter kit and supplies KIT Dispense based on patient and insurance preference. Use up to four times daily as directed. (FOR ICD-9 250.00, 250.01). 1 each 0  . feeding supplement, GLUCERNA SHAKE, (GLUCERNA SHAKE) LIQD Take 237 mLs by mouth 2 (two) times daily between meals.  0   No current facility-administered medications on file prior to visit.    No family history on file. Social History   Social History  .  Marital status: Widowed    Spouse name: N/A  . Number of children: N/A  . Years of education: N/A   Occupational History  . Not on file.   Social History Main Topics  . Smoking status: Never Smoker  . Smokeless tobacco: Never Used  . Alcohol use No  . Drug use: No  . Sexual activity: Not on file   Other Topics Concern  . Not on file   Social History Narrative  . No narrative on file    Objective:   Vitals:   12/30/15 1118  BP: 140/83  Pulse: 83  Resp: 16  Temp: 98.2 F (36.8 C)    Filed Weights   12/30/15 1118  Weight: 77 lb 3.2 oz (35 kg)    BP Readings from Last 3 Encounters:  12/30/15 140/83  12/16/15 (!) 132/45  12/10/14 (!) 142/74    Physical Exam: Constitutional: thin, cachetic, chronic ill appearing female, appears older than stated aged; No distress. AAOx3 HENT: Normocephalic, atraumatic, External right and left ear normal. Oropharynx is clear and moist.  Eyes: Conjunctivae and EOM are normal. PERRL, no scleral icterus. Neck: Normal ROM. Neck supple. No JVD.  CVS: RRR, S1/S2 +, no murmurs, no gallops, no carotid bruit.  Pulmonary: Effort and breath sounds normal, no stridor, rhonchi, wheezes, rales.  Abdominal: Soft. BS +, no distension, tenderness, rebound or guarding.  Musculoskeletal: Normal range of motion. No edema and no tenderness, diffuse muscle atrophy  throughout. Foot exam: bilateral peripheral pulses 2+ (dorsalis pedis and post tibialis pulses), no ulcers noted/no ecchymosis, warm to touch, monofilament testing 1/3 bilat. Sensation intact.  No c/c; trace feet edema. Lymphadenopathy: No lymphadenopathy noted, cervical. Neuro: Alert.  muscle tone coordination wnl. No cranial nerve deficit grossly. Skin: Skin is warm and dry. No rash noted. Not diaphoretic. No erythema. No pallor. Psychiatric: Normal mood and affect. Behavior, judgment, thought content normal.  Lab Results  Component Value Date   WBC 6.0 12/16/2015   HGB 9.1 (L) 12/16/2015    HCT 28.6 (L) 12/16/2015   MCV 62.9 (L) 12/16/2015   PLT 109 (L) 12/16/2015   Lab Results  Component Value Date   CREATININE 0.52 12/16/2015   BUN 14 12/16/2015   NA 136 12/16/2015   K 3.5 12/16/2015   CL 107 12/16/2015   CO2 26 12/16/2015    Lab Results  Component Value Date   HGBA1C 12.2 (H) 12/15/2015   Lipid Panel  No results found for: CHOL, TRIG, HDL, CHOLHDL, VLDL, LDLCALC     Depression screen [facility] 2/9 12/30/2015 12/10/2014  Decreased Interest 0 0  Down, Depressed, Hopeless 1 0  PHQ - 2 Score 1 0    Assessment and plan:   1. Type 2 diabetes mellitus with diabetic polyneuropathy, without long-term current use of insulin (HCC) Ooc, suspect muscle catabolism due to long term uncontrolled dm. - dw pt that as we get her glucose better under control w/ insulin, she may notice some blurry vision worse than normal, this is due to fluid shift, should resolve /get better - Ambulatory referral to Ophthalmology - BASIC METABOLIC PANEL WITH GFR - continue metformin 1000bid - added lantus solarstor pen 20units qhs - glucometer lancets/strips for accucheck (pt medicare/medicaid) ordered - f/u w/ [facility] [facility] 2 wks for DM chk. - a1c 12.2 (12/15/15)  2. Protein-calorie malnutrition, severe Suspect associated w/ longstanding dm ooc/muscle catabolism - increase protein intake recd - TSH  3. Anemia, unspecified anemia type Appears due to chronic disease, but low iron levels on  12/15/15 labs - iron tab qd for now, will rechk in 3 months - CBC with Differential/Platelet  4. Screening for thyroid disorder - tsh  5. Vitamin D deficiency, given decrease bmi, high risk for bone frx - Vitamin D, 25-hydroxy  6. Health maintenance - needs mm/colonoscopy  - will talk more at next visit - tdap and pneumococcal 23 valent vaccines today.  Return in about 2 months (around 02/29/2016) for dm.  The patient was given clear instructions to go to ER or return to medical center if  symptoms don't improve, worsen or new problems develop. The patient verbalized understanding. The patient was told to call to get lab results if they haven't heard anything in the next week.    This note has been created with Surveyor, quantity. Any transcriptional errors are unintentional.   Maren Reamer, MD, [facility] [place], [place]   12/30/2015, 12:53 PM

## 2015-12-31 ENCOUNTER — Other Ambulatory Visit: Payer: Self-pay | Admitting: Internal Medicine

## 2015-12-31 LAB — VITAMIN D 25 HYDROXY (VIT D DEFICIENCY, FRACTURES): Vit D, 25-Hydroxy: 23 ng/mL — ABNORMAL LOW (ref 30–100)

## 2015-12-31 MED ORDER — VITAMIN D (ERGOCALCIFEROL) 1.25 MG (50000 UNIT) PO CAPS: 50000.0000 [IU] | ORAL_CAPSULE | ORAL | 0 refills | Status: DC

## 2016-01-01 ENCOUNTER — Telehealth: Payer: Self-pay

## 2016-01-01 NOTE — Telephone Encounter (Signed)
Contacted pt to go over lab results pt did not answer and was unable to lvm due to mailbox not being set up. Contacted the house and mobile numnber

## 2016-01-03 ENCOUNTER — Telehealth: Payer: Self-pay

## 2016-01-03 NOTE — Telephone Encounter (Signed)
Contacted pt to go over lab results pt did not answer and was unable to lvm due to mailbox not being set up. Contacted the house and mobile number will be mailing letter out today

## 2016-01-04 NOTE — Progress Notes (Signed)
New PT note to include G-codes.  Encarnacion ChuAshley Abashian PT, DPT  Pager: [phone] Phone: [phone]    12/16/15 1110  PT G-Codes **NOT FOR INPATIENT CLASS**  Functional Assessment Tool Used Clinical Judgement  Functional Limitation Mobility: Walking and moving around  Mobility: Walking and Moving Around Current Status [phone]([id number]) CI  Mobility: Walking and Moving Around Goal Status [phone](G8979) CI

## 2016-01-09 ENCOUNTER — Emergency Department (HOSPITAL_COMMUNITY): Payer: Medicare Other

## 2016-01-09 ENCOUNTER — Inpatient Hospital Stay (HOSPITAL_COMMUNITY)
Admission: EM | Admit: 2016-01-09 | Discharge: 2016-01-16 | DRG: 166 | Disposition: A | Discharge status: Home Health | Payer: Medicare Other | Attending: Internal Medicine | Admitting: Internal Medicine

## 2016-01-09 ENCOUNTER — Encounter (HOSPITAL_COMMUNITY): Payer: Self-pay | Admitting: Emergency Medicine

## 2016-01-09 DIAGNOSIS — E869 Volume depletion, unspecified: Secondary | ICD-10-CM | POA: Diagnosis not present

## 2016-01-09 DIAGNOSIS — Z681 Body mass index (BMI) 19 or less, adult: Secondary | ICD-10-CM

## 2016-01-09 DIAGNOSIS — R0902 Hypoxemia: Secondary | ICD-10-CM | POA: Diagnosis not present

## 2016-01-09 DIAGNOSIS — Z794 Long term (current) use of insulin: Secondary | ICD-10-CM | POA: Diagnosis not present

## 2016-01-09 DIAGNOSIS — Z8611 Personal history of tuberculosis: Secondary | ICD-10-CM | POA: Diagnosis present

## 2016-01-09 DIAGNOSIS — R64 Cachexia: Secondary | ICD-10-CM | POA: Diagnosis not present

## 2016-01-09 DIAGNOSIS — K219 Gastro-esophageal reflux disease without esophagitis: Secondary | ICD-10-CM | POA: Diagnosis not present

## 2016-01-09 DIAGNOSIS — J850 Gangrene and necrosis of lung: Secondary | ICD-10-CM | POA: Diagnosis not present

## 2016-01-09 DIAGNOSIS — K0530 Chronic periodontitis, unspecified: Secondary | ICD-10-CM | POA: Diagnosis present

## 2016-01-09 DIAGNOSIS — R634 Abnormal weight loss: Secondary | ICD-10-CM | POA: Diagnosis not present

## 2016-01-09 DIAGNOSIS — E871 Hypo-osmolality and hyponatremia: Secondary | ICD-10-CM | POA: Diagnosis present

## 2016-01-09 DIAGNOSIS — A159 Respiratory tuberculosis unspecified: Secondary | ICD-10-CM

## 2016-01-09 DIAGNOSIS — D509 Iron deficiency anemia, unspecified: Secondary | ICD-10-CM | POA: Diagnosis not present

## 2016-01-09 DIAGNOSIS — Z23 Encounter for immunization: Secondary | ICD-10-CM | POA: Diagnosis not present

## 2016-01-09 DIAGNOSIS — R938 Abnormal findings on diagnostic imaging of other specified body structures: Secondary | ICD-10-CM

## 2016-01-09 DIAGNOSIS — R079 Chest pain, unspecified: Secondary | ICD-10-CM

## 2016-01-09 DIAGNOSIS — J851 Abscess of lung with pneumonia: Secondary | ICD-10-CM | POA: Diagnosis not present

## 2016-01-09 DIAGNOSIS — R531 Weakness: Secondary | ICD-10-CM | POA: Diagnosis not present

## 2016-01-09 DIAGNOSIS — J852 Abscess of lung without pneumonia: Secondary | ICD-10-CM | POA: Diagnosis not present

## 2016-01-09 DIAGNOSIS — R9389 Abnormal findings on diagnostic imaging of other specified body structures: Secondary | ICD-10-CM

## 2016-01-09 DIAGNOSIS — M264 Malocclusion, unspecified: Secondary | ICD-10-CM | POA: Diagnosis present

## 2016-01-09 DIAGNOSIS — E1165 Type 2 diabetes mellitus with hyperglycemia: Secondary | ICD-10-CM | POA: Diagnosis present

## 2016-01-09 DIAGNOSIS — I10 Essential (primary) hypertension: Secondary | ICD-10-CM | POA: Diagnosis present

## 2016-01-09 DIAGNOSIS — R627 Adult failure to thrive: Secondary | ICD-10-CM | POA: Diagnosis not present

## 2016-01-09 DIAGNOSIS — K529 Noninfective gastroenteritis and colitis, unspecified: Secondary | ICD-10-CM | POA: Diagnosis not present

## 2016-01-09 DIAGNOSIS — J189 Pneumonia, unspecified organism: Secondary | ICD-10-CM

## 2016-01-09 DIAGNOSIS — E43 Unspecified severe protein-calorie malnutrition: Secondary | ICD-10-CM | POA: Diagnosis not present

## 2016-01-09 DIAGNOSIS — J984 Other disorders of lung: Secondary | ICD-10-CM

## 2016-01-09 LAB — URINALYSIS, ROUTINE W REFLEX MICROSCOPIC
Bilirubin Urine: NEGATIVE
GLUCOSE, UA: NEGATIVE mg/dL
Hgb urine dipstick: NEGATIVE
KETONES UR: NEGATIVE mg/dL
NITRITE: NEGATIVE
PROTEIN: 100 mg/dL — AB
Specific Gravity, Urine: 1.021 (ref 1.005–1.030)
pH: 5.5 (ref 5.0–8.0)

## 2016-01-09 LAB — CBC
HEMATOCRIT: 29 % — AB (ref 36.0–46.0)
HEMOGLOBIN: 9.7 g/dL — AB (ref 12.0–15.0)
MCH: 21.1 pg — ABNORMAL LOW (ref 26.0–34.0)
MCHC: 33.4 g/dL (ref 30.0–36.0)
MCV: 63.2 fL — AB (ref 78.0–100.0)
Platelets: 169 10*3/uL (ref 150–400)
RBC: 4.59 MIL/uL (ref 3.87–5.11)
RDW: 16.5 % — ABNORMAL HIGH (ref 11.5–15.5)
WBC: 7.7 10*3/uL (ref 4.0–10.5)

## 2016-01-09 LAB — URINE MICROSCOPIC-ADD ON: RBC / HPF: NONE SEEN RBC/hpf (ref 0–5)

## 2016-01-09 LAB — BASIC METABOLIC PANEL
Anion gap: 9 (ref 5–15)
BUN: 27 mg/dL — AB (ref 6–20)
CHLORIDE: 97 mmol/L — AB (ref 101–111)
CO2: 27 mmol/L (ref 22–32)
Calcium: 9.5 mg/dL (ref 8.9–10.3)
Creatinine, Ser: 0.64 mg/dL (ref 0.44–1.00)
GFR calc non Af Amer: >60 mL/min (ref >60)
Glucose, Bld: 136 mg/dL — ABNORMAL HIGH (ref 65–99)
POTASSIUM: 4.4 mmol/L (ref 3.5–5.1)
SODIUM: 133 mmol/L — AB (ref 135–145)

## 2016-01-09 LAB — GLUCOSE, CAPILLARY: Glucose-Capillary: 163 mg/dL — ABNORMAL HIGH (ref 65–99)

## 2016-01-09 LAB — ALBUMIN: Albumin: 3.2 g/dL — ABNORMAL LOW (ref 3.5–5.0)

## 2016-01-09 LAB — I-STAT TROPONIN, ED: Troponin i, poc: 0 ng/mL (ref 0.00–0.08)

## 2016-01-09 LAB — CBG MONITORING, ED: Glucose-Capillary: 132 mg/dL — ABNORMAL HIGH (ref 65–99)

## 2016-01-09 LAB — I-STAT CG4 LACTIC ACID, ED: Lactic Acid, Venous: 1.39 mmol/L (ref 0.5–1.9)

## 2016-01-09 LAB — TROPONIN I: Troponin I: <0.03 ng/mL (ref <0.03)

## 2016-01-09 LAB — PREALBUMIN: Prealbumin: 19.2 mg/dL (ref 18–38)

## 2016-01-09 MED ORDER — VANCOMYCIN HCL IN DEXTROSE 1-5 GM/200ML-% IV SOLN
1000.0000 mg | Freq: Once | INTRAVENOUS | Status: AC
Start: 2016-01-09 — End: 2016-01-09
  Administered 2016-01-09: 1000 mg via INTRAVENOUS
  Filled 2016-01-09: qty 200

## 2016-01-09 MED ORDER — VANCOMYCIN HCL 500 MG IV SOLR
500.0000 mg | INTRAVENOUS | Status: DC
  Administered 2016-01-10 – 2016-01-13 (×4): 500 mg via INTRAVENOUS
  Filled 2016-01-09 (×4): qty 500

## 2016-01-09 MED ORDER — INSULIN ASPART 100 UNIT/ML ~~LOC~~ SOLN
0.0000 [IU] | Freq: Every day | SUBCUTANEOUS | Status: DC
  Administered 2016-01-10 – 2016-01-11 (×2): 3 [IU] via SUBCUTANEOUS
  Administered 2016-01-12: 4 [IU] via SUBCUTANEOUS
  Administered 2016-01-13 – 2016-01-14 (×2): 3 [IU] via SUBCUTANEOUS
  Administered 2016-01-15: 2 [IU] via SUBCUTANEOUS

## 2016-01-09 MED ORDER — DIATRIZOATE MEGLUMINE & SODIUM 66-10 % PO SOLN
ORAL | Status: AC
  Filled 2016-01-09: qty 30

## 2016-01-09 MED ORDER — INSULIN GLARGINE 100 UNIT/ML ~~LOC~~ SOLN
20.0000 [IU] | Freq: Every day | SUBCUTANEOUS | Status: DC
  Administered 2016-01-10 – 2016-01-12 (×4): 20 [IU] via SUBCUTANEOUS
  Filled 2016-01-09 (×5): qty 0.2

## 2016-01-09 MED ORDER — FERROUS GLUCONATE 324 (38 FE) MG PO TABS
324.0000 mg | ORAL_TABLET | Freq: Every day | ORAL | Status: DC
  Administered 2016-01-10 – 2016-01-16 (×7): 324 mg via ORAL
  Filled 2016-01-09 (×9): qty 1

## 2016-01-09 MED ORDER — GLUCERNA SHAKE PO LIQD
237.0000 mL | Freq: Two times a day (BID) | ORAL | Status: DC
  Administered 2016-01-10 – 2016-01-15 (×10): 237 mL via ORAL

## 2016-01-09 MED ORDER — SODIUM CHLORIDE 0.9 % IV SOLN
INTRAVENOUS | Status: DC
  Administered 2016-01-10 – 2016-01-13 (×5): via INTRAVENOUS

## 2016-01-09 MED ORDER — INSULIN ASPART 100 UNIT/ML ~~LOC~~ SOLN
0.0000 [IU] | Freq: Three times a day (TID) | SUBCUTANEOUS | Status: DC
  Administered 2016-01-10: 3 [IU] via SUBCUTANEOUS
  Administered 2016-01-10: 1 [IU] via SUBCUTANEOUS
  Administered 2016-01-11: 3 [IU] via SUBCUTANEOUS
  Administered 2016-01-11: 2 [IU] via SUBCUTANEOUS
  Administered 2016-01-12: 3 [IU] via SUBCUTANEOUS
  Administered 2016-01-12: 7 [IU] via SUBCUTANEOUS
  Administered 2016-01-13: 5 [IU] via SUBCUTANEOUS
  Administered 2016-01-13: 3 [IU] via SUBCUTANEOUS
  Administered 2016-01-14: 7 [IU] via SUBCUTANEOUS
  Administered 2016-01-14: 3 [IU] via SUBCUTANEOUS
  Administered 2016-01-15: 1 [IU] via SUBCUTANEOUS
  Administered 2016-01-15: 7 [IU] via SUBCUTANEOUS

## 2016-01-09 MED ORDER — ENOXAPARIN SODIUM 40 MG/0.4ML ~~LOC~~ SOLN: 40.0000 mg | Freq: Every day | SUBCUTANEOUS | Status: DC

## 2016-01-09 MED ORDER — DEXTROSE 5 % IV SOLN
1.0000 g | INTRAVENOUS | Status: DC
  Administered 2016-01-09 – 2016-01-13 (×5): 1 g via INTRAVENOUS
  Filled 2016-01-09 (×6): qty 1

## 2016-01-09 MED ORDER — DEXTROSE 5 % IV SOLN
1.0000 g | INTRAVENOUS | Status: DC
  Filled 2016-01-09: qty 1

## 2016-01-09 MED ORDER — GABAPENTIN 300 MG PO CAPS
600.0000 mg | ORAL_CAPSULE | Freq: Three times a day (TID) | ORAL | Status: DC
  Administered 2016-01-10 – 2016-01-16 (×19): 600 mg via ORAL
  Filled 2016-01-09 (×20): qty 2

## 2016-01-09 MED ORDER — IOPAMIDOL (ISOVUE-300) INJECTION 61%
INTRAVENOUS | Status: AC
  Administered 2016-01-09: 75 mL
  Filled 2016-01-09: qty 100

## 2016-01-09 MED ORDER — VITAMIN D (ERGOCALCIFEROL) 1.25 MG (50000 UNIT) PO CAPS
50000.0000 [IU] | ORAL_CAPSULE | ORAL | Status: DC
  Administered 2016-01-10: 50000 [IU] via ORAL
  Filled 2016-01-09: qty 1

## 2016-01-09 NOTE — ED Notes (Signed)
Gave report to [facility]. Consulting civil engineerCharge RN for that floor notified that there were no negative pressure rooms available at this time. Bed control contacted. Will wait for new bed assignment.

## 2016-01-09 NOTE — ED Notes (Signed)
Vanc almost done and RN noticed patient neck red and patient very itchy in that area. Vanc stopped at this time and admitting paged.

## 2016-01-09 NOTE — ED Provider Notes (Signed)
[facility] DEPT Provider Note   CSN: [id number] Arrival date & time: 01/09/16  0501  History   Chief Complaint Chief Complaint  Patient presents with  . Chest Pain  . Foot Pain    HPI Olivia Williamson is a 70 y.o. female.  HPI  Patient presents with foot pain, generalized weakness, and chest pain. Son at bedside serving as interpreter (patient speaks [place]agn[place], and interpreter not available until 9AM).   Patient with long-standing history of uncontrolled diabetes. Has not taken insulin in many months, and symptoms are worsening. She presented with similar complaints three weeks ago, and was found to be hyperglycemic to 600s in ED. She was subsequently admitted and placed on insulin drip overnight. Patient has not taken insulin since discharge on 7/24. Since then, patient reports her symptoms improved for a couple weeks, but began to worsen again 6 days ago. Since then she describes worsening foot pain bilaterally, generalized weakness, and pain in her abdomen, chest, and back which she describes as feeling like heartburn. She presented to ED today because her foot pain became so severe that she was unable to walk. Denies SOB. Endorses decreased appetite. Patient has list of most recent CBGs from yesterday, all of which were in 90s-180s. Has not been taking insulin because son reports he was told by her pharmacy that her insulin would not be ready for pick up until 8/31.   Past Medical History:  Diagnosis Date  . Arthritis   . Diabetes mellitus without complication ([place])    stopped DM meds 2004  . GERD (gastroesophageal reflux disease)   . Hypertension    meds stopped 1 week ago , need to get renewed    Patient Active Problem List   Diagnosis Date Noted  . Protein-calorie malnutrition, severe 12/16/2015  . Generalized weakness 12/15/2015  . Hyponatremia 12/15/2015  . Anemia 12/15/2015  . Hyperglycemia 12/15/2015  . Nausea without vomiting 12/15/2015  . Hypertension   . GERD  (gastroesophageal reflux disease)   . Diabetes mellitus with neurological manifestations [facility])     Past Surgical History:  Procedure Laterality Date  . CHOLECYSTECTOMY N/A 05/02/2014   Procedure: LAPAROSCOPIC CHOLECYSTECTOMY ;  Surgeon: Coralie Keens, MD;  Location: [facility];  Service: General;  Laterality: N/A;  laparoscopic cholecystectomy  . COLONOSCOPY    . NO PAST SURGERIES      OB History    No data available     Home Medications    Prior to Admission medications   Medication Sig Start Date End Date Taking? Authorizing Provider  blood glucose meter kit and supplies KIT Dispense based on patient and insurance preference. Use up to four times daily as directed. (FOR ICD-9 250.00, 250.01). 12/16/15  Yes Silver Huguenin Elgergawy, MD  feeding supplement, GLUCERNA SHAKE, (GLUCERNA SHAKE) LIQD Take 237 mLs by mouth 2 (two) times daily between meals. 12/16/15  Yes Albertine Patricia, MD  Ferrous Gluconate 324 (37.5 Fe) MG TABS Take 1 tablet (324 mg total) by mouth daily. 12/30/15  Yes Maren Reamer, MD  gabapentin (NEURONTIN) 300 MG capsule Take 2 capsules (600 mg total) by mouth 3 (three) times daily. 12/30/15  Yes Maren Reamer, MD  glucose blood test strip Use as instructed 12/30/15  Yes Maren Reamer, MD  Insulin Glargine (LANTUS SOLOSTAR) 100 UNIT/ML Solostar Pen Inject 20 Units into the skin daily at 10 pm. 12/30/15  Yes Maren Reamer, MD  Insulin Pen Needle (ULTICARE MICRO PEN NEEDLES) 32G X 4 MM MISC 1  applicator by Does not apply route at bedtime. 12/30/15  Yes Maren Reamer, MD  Lancet Devices [facility]) lancets Use as instructed 12/30/15  Yes Maren Reamer, MD  metFORMIN (GLUCOPHAGE) 1000 MG tablet Take 1 tablet (1,000 mg total) by mouth 2 (two) times daily with a meal. 12/30/15  Yes Maren Reamer, MD  Vitamin D, Ergocalciferol, (DRISDOL) 50000 units CAPS capsule Take 1 capsule (50,000 Units total) by mouth every 7 (seven) days. 12/31/15  Yes Maren Reamer, MD     Family History No family history on file.  Social History Social History  Substance Use Topics  . Smoking status: Never Smoker  . Smokeless tobacco: Never Used  . Alcohol use No   Allergies   Review of patient's allergies indicates no known allergies.  Review of Systems Review of Systems  Constitutional: Positive for appetite change.  Respiratory: Negative for shortness of breath.   Cardiovascular: Positive for chest pain ("Feels like heartburn").  Gastrointestinal: Positive for abdominal pain ("Feels like heartburn").  Musculoskeletal:       Positive for foot pain  Skin: Negative for rash and wound.  Neurological: Positive for weakness and numbness.   Physical Exam Updated Vital Signs BP 134/55   Pulse 79   Temp 99.4 F (37.4 C) (Oral)   Resp 11   SpO2 98%   Physical Exam  Constitutional: She is oriented to person, place, and time.  Cachectic, chronically ill appearing female, appears older than stated age; lying in bed in NAD  HENT:  Head: Normocephalic and atraumatic.  Nose: Nose normal.  Mouth/Throat: Oropharynx is clear and moist. No oropharyngeal exudate.  Eyes: Conjunctivae and EOM are normal. Pupils are equal, round, and reactive to light. Right eye exhibits no discharge. Left eye exhibits no discharge. No scleral icterus.  Cardiovascular: Normal rate, regular rhythm and normal heart sounds.   No murmur heard. Pulmonary/Chest: Effort normal and breath sounds normal. No respiratory distress. She has no wheezes.  Abdominal: Soft. Bowel sounds are normal. She exhibits no distension. There is no tenderness.  Musculoskeletal: She exhibits no edema or tenderness.  TTP of chest wall  Neurological: She is alert and oriented to person, place, and time.  Loss of sensation to light touch to feet bilaterally; sensation intact in legs  Skin: Skin is warm and dry.   ED Treatments / Results  Labs (all labs ordered are listed, but only abnormal results are  displayed) Labs Reviewed  BASIC METABOLIC PANEL - Abnormal; Notable for the following:       Result Value   Sodium 133 (*)    Chloride 97 (*)    Glucose, Bld 136 (*)    BUN 27 (*)    All other components within normal limits  CBC - Abnormal; Notable for the following:    Hemoglobin 9.7 (*)    HCT 29.0 (*)    MCV 63.2 (*)    MCH 21.1 (*)    RDW 16.5 (*)    All other components within normal limits  URINALYSIS, ROUTINE W REFLEX MICROSCOPIC (NOT AT [facility]) - Abnormal; Notable for the following:    Protein, ur 100 (*)    Leukocytes, UA MODERATE (*)    All other components within normal limits  URINE MICROSCOPIC-ADD ON - Abnormal; Notable for the following:    Squamous Epithelial / LPF 0-5 (*)    Bacteria, UA RARE (*)    Casts HYALINE CASTS (*)    All other components within normal limits  CULTURE, BLOOD (ROUTINE X 2)  CULTURE, BLOOD (ROUTINE X 2)  TROPONIN I  TROPONIN I  I-STAT TROPOININ, ED  I-STAT CG4 LACTIC ACID, ED    EKG  EKG Interpretation  Date/Time:  Thursday January 09 2016 05:06:19 EDT Ventricular Rate:  89 PR Interval:  152 QRS Duration: 68 QT Interval:  354 QTC Calculation: 430 R Axis:   46 Text Interpretation:  Normal sinus rhythm Septal infarct , age undetermined Abnormal ECG No significant change was found PR depression? Confirmed by Wyvonnia Dusky  MD, [place] [phone]) on 01/09/2016 7:05:53 AM       Radiology Dg Chest 2 View  Result Date: 01/09/2016 CLINICAL DATA:  Left-sided anterior and posterior chest pain and bilateral foot pain. History of diabetes, smoker, hypertension. EXAM: CHEST  2 VIEW COMPARISON:  None. FINDINGS: Normal heart size and pulmonary vascularity. Multiple pulmonary nodules are demonstrated. There is a 15 mm cavitary nodule in the right lung base, a 15 mm solid nodule more laterally in the right lung base, and a 13 mm solid nodule projected over the posterior mid lung on the lateral view. Appearances are worrisome for primary or metastatic  disease and CT is recommended for further evaluation. No blunting of costophrenic angles. No pneumothorax. Mediastinal contours appear intact. Calcification of the aorta. Surgical clips in the right upper quadrant. IMPRESSION: Three pulmonary nodules identified. Primary or metastatic disease is suspected. CT recommended for further evaluation. Electronically Signed   By: Lucienne Capers M.D.   On: 01/09/2016 06:32   Ct Chest W Contrast  Result Date: 01/09/2016 CLINICAL DATA:  70 year old female non English speaker with cough, weakness, fatigue, chest pain. Initial encounter. EXAM: CT CHEST, ABDOMEN, AND PELVIS WITH CONTRAST TECHNIQUE: Multidetector CT imaging of the chest, abdomen and pelvis was performed following the standard protocol during bolus administration of intravenous contrast. CONTRAST:  69m ISOVUE-300 IOPAMIDOL (ISOVUE-300) INJECTION 61% COMPARISON:  Chest radiographs 0612 hours today. CT Abdomen and Pelvis 05/04/2014. FINDINGS: CT CHEST FINDINGS No pericardial effusion. Central pulmonary artery enlargement to the same caliber as the ascending aorta is noted. Major mediastinal vascular structures remain patent. Mild mostly noncalcified thoracic aortic atherosclerosis. No hilar or mediastinal lymphadenopathy. Small calcified right peritracheal lymph nodes. No axillary lymphadenopathy. Major airways are patent. In the superior segment of the left lower lobe there is consolidation with surrounding peribronchial nodularity, but also a 13 mm air-fluid collection located within the consolidated lung (series 2, image 26). There is mild narrowing of adjacent airways. There is no associated left pleural effusion. On the 2015 comparison there was right lower lobe pneumonia. Right lower lobe ventilation has significantly improved since that time but there is a new mildly thick-walled 15 mm air-fluid collection in the right lower lobe on series 3, image 96 with surrounding tree-in-bud nodularity. There is  also a a 15-16 mm new costophrenic angle pulmonary nodule on that side. Calcified granuloma right middle lobe. There is mild bronchiectasis and atelectasis in the right middle lobe. There is superimposed mild lung base atelectasis. No pleural effusion. No acute osseous abnormality identified. Low-density 2.3 cm left thyroid nodule. Additional smaller coarsely calcified and hypodense bilateral thyroid nodules. No thoracic inlet lymphadenopathy. CT ABDOMEN PELVIS FINDINGS No acute osseous abnormality identified. Mildly distended urinary bladder.  Negative uterus and adnexa. Indistinct rectum with wall thickening up to 9 mm. Small volume contained stool and fluid in the rectum. Similar appearance of the sigmoid colon which is decompressed. Wall thickening continues throughout the left colon, but is less apparent at the  splenic flexure. Mild wall thickening also appears to extend throughout the transverse colon and right colon. The appendix is not definitely involved. The terminal ileum is decompressed. Oral contrast was administered but has not yet reached the distal small bowel. Some proximal jejunal loops are mildly to moderately thickened (series 2, image 65). The duodenum is not definitely affected. Negative stomach. No abdominal free air or free fluid. Surgically absent gallbladder. Stable liver with several small circumscribed low-density areas which most resemble benign cysts. Negative spleen, pancreas and adrenal glands. Portal venous system is patent. Aortoiliac calcified atherosclerosis noted. Major arterial structures are patent. Bilateral renal enhancement and contrast excretion is within normal limits. No lymphadenopathy identified. IMPRESSION: 1. Superior segment left lower lobe necrotizing pneumonia with small lung abscess (series 2, image 26). No associated left pleural effusion or thoracic lymphadenopathy. See also #4 regarding superimposed acute infection in the abdomen and pelvis. 2. Also at the site  of a right lower lobe consolidation on the 2015 comparison there is a new 15 mm cavitary lesion with a small fluid level suggesting abscess, as well as surrounding tree-in-bud nodularity indicating acute infection. 3. Superimposed 16 mm solid right lower lobe lung nodule. Recommend post treatment chest CT follow-up of this finding. 4. New diffuse large bowel wall thickening in the abdomen in keeping with acute colitis. No abdominal free abscess or complicating features identified. 5. Questionable infectious involvement of proximal small bowel loops. No evidence of obstruction. 6.  Calcified aortic atherosclerosis. Electronically Signed   By: Genevie Ann M.D.   On: 01/09/2016 13:09   Ct Abdomen Pelvis W Contrast  Result Date: 01/09/2016 CLINICAL DATA:  70 year old female non English speaker with cough, weakness, fatigue, chest pain. Initial encounter. EXAM: CT CHEST, ABDOMEN, AND PELVIS WITH CONTRAST TECHNIQUE: Multidetector CT imaging of the chest, abdomen and pelvis was performed following the standard protocol during bolus administration of intravenous contrast. CONTRAST:  75m ISOVUE-300 IOPAMIDOL (ISOVUE-300) INJECTION 61% COMPARISON:  Chest radiographs 0612 hours today. CT Abdomen and Pelvis 05/04/2014. FINDINGS: CT CHEST FINDINGS No pericardial effusion. Central pulmonary artery enlargement to the same caliber as the ascending aorta is noted. Major mediastinal vascular structures remain patent. Mild mostly noncalcified thoracic aortic atherosclerosis. No hilar or mediastinal lymphadenopathy. Small calcified right peritracheal lymph nodes. No axillary lymphadenopathy. Major airways are patent. In the superior segment of the left lower lobe there is consolidation with surrounding peribronchial nodularity, but also a 13 mm air-fluid collection located within the consolidated lung (series 2, image 26). There is mild narrowing of adjacent airways. There is no associated left pleural effusion. On the 2015 comparison  there was right lower lobe pneumonia. Right lower lobe ventilation has significantly improved since that time but there is a new mildly thick-walled 15 mm air-fluid collection in the right lower lobe on series 3, image 96 with surrounding tree-in-bud nodularity. There is also a a 15-16 mm new costophrenic angle pulmonary nodule on that side. Calcified granuloma right middle lobe. There is mild bronchiectasis and atelectasis in the right middle lobe. There is superimposed mild lung base atelectasis. No pleural effusion. No acute osseous abnormality identified. Low-density 2.3 cm left thyroid nodule. Additional smaller coarsely calcified and hypodense bilateral thyroid nodules. No thoracic inlet lymphadenopathy. CT ABDOMEN PELVIS FINDINGS No acute osseous abnormality identified. Mildly distended urinary bladder.  Negative uterus and adnexa. Indistinct rectum with wall thickening up to 9 mm. Small volume contained stool and fluid in the rectum. Similar appearance of the sigmoid colon which is decompressed. Wall thickening  continues throughout the left colon, but is less apparent at the splenic flexure. Mild wall thickening also appears to extend throughout the transverse colon and right colon. The appendix is not definitely involved. The terminal ileum is decompressed. Oral contrast was administered but has not yet reached the distal small bowel. Some proximal jejunal loops are mildly to moderately thickened (series 2, image 65). The duodenum is not definitely affected. Negative stomach. No abdominal free air or free fluid. Surgically absent gallbladder. Stable liver with several small circumscribed low-density areas which most resemble benign cysts. Negative spleen, pancreas and adrenal glands. Portal venous system is patent. Aortoiliac calcified atherosclerosis noted. Major arterial structures are patent. Bilateral renal enhancement and contrast excretion is within normal limits. No lymphadenopathy identified.  IMPRESSION: 1. Superior segment left lower lobe necrotizing pneumonia with small lung abscess (series 2, image 26). No associated left pleural effusion or thoracic lymphadenopathy. See also #4 regarding superimposed acute infection in the abdomen and pelvis. 2. Also at the site of a right lower lobe consolidation on the 2015 comparison there is a new 15 mm cavitary lesion with a small fluid level suggesting abscess, as well as surrounding tree-in-bud nodularity indicating acute infection. 3. Superimposed 16 mm solid right lower lobe lung nodule. Recommend post treatment chest CT follow-up of this finding. 4. New diffuse large bowel wall thickening in the abdomen in keeping with acute colitis. No abdominal free abscess or complicating features identified. 5. Questionable infectious involvement of proximal small bowel loops. No evidence of obstruction. 6.  Calcified aortic atherosclerosis. Electronically Signed   By: Genevie Ann M.D.   On: 01/09/2016 13:09    Procedures Procedures (including critical care time)  Medications Ordered in ED Medications  diatrizoate meglumine-sodium (GASTROGRAFIN) 66-10 % solution (not administered)  vancomycin (VANCOCIN) IVPB 1000 mg/200 mL premix (not administered)  ceFEPIme (MAXIPIME) 1 g in dextrose 5 % 50 mL IVPB (1 g Intravenous New Bag/Given 01/09/16 1416)  iopamidol (ISOVUE-300) 61 % injection (75 mLs  Contrast Given 01/09/16 1124)     Initial Impression / Assessment and Plan / ED Course  I have reviewed the triage vital signs and the nursing notes.  Pertinent labs & imaging results that were available during my care of the patient were reviewed by me and considered in my medical decision making (see chart for details).  Clinical Course   0745 - Given lung nodules noted on CXR today as well as current persistent chest pain and abdominal pain, will obtain CT chest and CT abd/pelvis. Initial trop neg, but will also obtain repeat 3 hr troponin as patient unable to  provide detailed explanation of symptoms.   Final Clinical Impressions(s) / ED Diagnoses   Final diagnoses:  Chest pain   Patient presenting with generalized weakness and chest pain. CT with necrotizing pneumonia, lung abscess, lung nodules, and colitis. Blood cultures ordered and vanc/cefepime started. Hospitalist team consulted - will admit.   New Prescriptions New Prescriptions   No medications on file     Verner Mould, MD 01/09/16 [place], MD 01/09/16 [phone]

## 2016-01-09 NOTE — ED Notes (Signed)
Admitting MD advised holding vanc at this time.

## 2016-01-09 NOTE — ED Notes (Signed)
Attempted report, on hold x 5 mins

## 2016-01-09 NOTE — H&P (Signed)
History and Physical  Ellizabeth Dacruz VHQ:[id number] DOB: February 22, 1946 DOA: 01/09/2016  Referring physician: ER Physician PCP: Maren Reamer, MD  Outpatient Specialists:    Patient coming from: Home  Chief Complaint: Weakness  HPI: 70 year old female, non English speaking, seen alongside Son. Patient was admitted and managed in this hospital towards the end of last month with uncontrolled DM, nausea and vomiting. No fever or chills. No headache, no neck pain, no chest pain, no GI symptoms and no urinary symptoms,. Weight loss is reported. CT chest done on presentation revealed multiple lung abscesses.  ED Course: Antibiotics initiated  Pertinent labs: CT Chest and CXR findings noted. Imaging: independently reviewed.   Review of Systems:  As in HPI. Negative for fever, visual changes, sore throat, rash, new muscle aches, chest pain, SOB, dysuria, bleeding, n/v/abdominal pain.  Past Medical History:  Diagnosis Date  . Arthritis   . Diabetes mellitus without complication ([place])    stopped DM meds 2004  . GERD (gastroesophageal reflux disease)   . Hypertension    meds stopped 1 week ago , need to get renewed    Past Surgical History:  Procedure Laterality Date  . CHOLECYSTECTOMY N/A 05/02/2014   Procedure: LAPAROSCOPIC CHOLECYSTECTOMY ;  Surgeon: Coralie Keens, MD;  Location: [facility];  Service: General;  Laterality: N/A;  laparoscopic cholecystectomy  . COLONOSCOPY    . NO PAST SURGERIES       reports that she has never smoked. She has never used smokeless tobacco. She reports that she does not drink alcohol or use drugs.  No Known Allergies  No family history on file.   Prior to Admission medications   Medication Sig Start Date End Date Taking? Authorizing Provider  blood glucose meter kit and supplies KIT Dispense based on patient and insurance preference. Use up to four times daily as directed. (FOR ICD-9 250.00, 250.01). 12/16/15  Yes Silver Huguenin Elgergawy, MD  feeding  supplement, GLUCERNA SHAKE, (GLUCERNA SHAKE) LIQD Take 237 mLs by mouth 2 (two) times daily between meals. 12/16/15  Yes Albertine Patricia, MD  Ferrous Gluconate 324 (37.5 Fe) MG TABS Take 1 tablet (324 mg total) by mouth daily. 12/30/15  Yes Maren Reamer, MD  gabapentin (NEURONTIN) 300 MG capsule Take 2 capsules (600 mg total) by mouth 3 (three) times daily. 12/30/15  Yes Maren Reamer, MD  glucose blood test strip Use as instructed 12/30/15  Yes Maren Reamer, MD  Insulin Glargine (LANTUS SOLOSTAR) 100 UNIT/ML Solostar Pen Inject 20 Units into the skin daily at 10 pm. 12/30/15  Yes Maren Reamer, MD  Insulin Pen Needle (ULTICARE MICRO PEN NEEDLES) 32G X 4 MM MISC 1 applicator by Does not apply route at bedtime. 12/30/15  Yes Maren Reamer, MD  Lancet Devices [facility]) lancets Use as instructed 12/30/15  Yes Maren Reamer, MD  metFORMIN (GLUCOPHAGE) 1000 MG tablet Take 1 tablet (1,000 mg total) by mouth 2 (two) times daily with a meal. 12/30/15  Yes Maren Reamer, MD  Vitamin D, Ergocalciferol, (DRISDOL) 50000 units CAPS capsule Take 1 capsule (50,000 Units total) by mouth every 7 (seven) days. 12/31/15  Yes Maren Reamer, MD    Physical Exam: Vitals:   01/09/16 1230 01/09/16 1300 01/09/16 1345 01/09/16 1400  BP: 134/61 133/61 133/78 134/55  Pulse: 84 81 78 79  Resp: 14 14 14 11   Temp:      TempSrc:      SpO2: 98% 98% 99% 98%  Constitutional:  . Cachectic, but calm and comfortable Eyes:  . Pallor. No jaundice.  ENMT:  . external ears, nose appear normal. Dry buccal Mucosa Neck:  . Neck is supple. No JVD Respiratory:  . CTA bilaterally, no w/r/r.  . Respiratory effort normal. No retractions or accessory muscle use Cardiovascular:  . O2V0, systolic murmur . No LE extremity edema   Abdomen:  . Abdomen is soft and non tender. Organs are difficult to assess. Neurologic:  . Awake and alert. . Moves all limbs.  Wt Readings from Last 3 Encounters:  12/30/15 35  kg (77 lb 3.2 oz)  12/16/15 35.5 kg (78 lb 4.8 oz)  12/10/14 34.8 kg (76 lb 12.8 oz)    I have personally reviewed following labs and imaging studies  Labs on Admission:  CBC:  Recent Labs Lab 01/09/16 0515  WBC 7.7  HGB 9.7*  HCT 29.0*  MCV 63.2*  PLT 350   Basic Metabolic Panel:  Recent Labs Lab 01/09/16 0515  NA 133*  K 4.4  CL 97*  CO2 27  GLUCOSE 136*  BUN 27*  CREATININE 0.64  CALCIUM 9.5   Liver Function Tests: No results for input(s): AST, ALT, ALKPHOS, BILITOT, PROT, ALBUMIN in the last 168 hours. No results for input(s): LIPASE, AMYLASE in the last 168 hours. No results for input(s): AMMONIA in the last 168 hours. Coagulation Profile: No results for input(s): INR, PROTIME in the last 168 hours. Cardiac Enzymes:  Recent Labs Lab 01/09/16 0747 01/09/16 1205  TROPONINI <0.03 <0.03   BNP (last 3 results) No results for input(s): PROBNP in the last 8760 hours. HbA1C: No results for input(s): HGBA1C in the last 72 hours. CBG:  Recent Labs Lab 01/09/16 1424  GLUCAP 132*   Lipid Profile: No results for input(s): CHOL, HDL, LDLCALC, TRIG, CHOLHDL, LDLDIRECT in the last 72 hours. Thyroid Function Tests: No results for input(s): TSH, T4TOTAL, FREET4, T3FREE, THYROIDAB in the last 72 hours. Anemia Panel: No results for input(s): VITAMINB12, FOLATE, FERRITIN, TIBC, IRON, RETICCTPCT in the last 72 hours. Urine analysis:    Component Value Date/Time   COLORURINE YELLOW 01/09/2016 0518   APPEARANCEUR CLEAR 01/09/2016 0518   LABSPEC 1.021 01/09/2016 0518   PHURINE 5.5 01/09/2016 0518   GLUCOSEU NEGATIVE 01/09/2016 0518   HGBUR NEGATIVE 01/09/2016 0518   BILIRUBINUR NEGATIVE 01/09/2016 0518   KETONESUR NEGATIVE 01/09/2016 0518   PROTEINUR 100 (A) 01/09/2016 0518   NITRITE NEGATIVE 01/09/2016 0518   LEUKOCYTESUR MODERATE (A) 01/09/2016 0518   Sepsis Labs: @LABRCNTIP (procalcitonin:4,lacticidven:4) )No results found for this or any previous visit  (from the past 240 hour(s)).    Radiological Exams on Admission: Dg Chest 2 View  Result Date: 01/09/2016 CLINICAL DATA:  Left-sided anterior and posterior chest pain and bilateral foot pain. History of diabetes, smoker, hypertension. EXAM: CHEST  2 VIEW COMPARISON:  None. FINDINGS: Normal heart size and pulmonary vascularity. Multiple pulmonary nodules are demonstrated. There is a 15 mm cavitary nodule in the right lung base, a 15 mm solid nodule more laterally in the right lung base, and a 13 mm solid nodule projected over the posterior mid lung on the lateral view. Appearances are worrisome for primary or metastatic disease and CT is recommended for further evaluation. No blunting of costophrenic angles. No pneumothorax. Mediastinal contours appear intact. Calcification of the aorta. Surgical clips in the right upper quadrant. IMPRESSION: Three pulmonary nodules identified. Primary or metastatic disease is suspected. CT recommended for further evaluation. Electronically Signed   By:  Lucienne Capers M.D.   On: 01/09/2016 06:32   Ct Chest W Contrast  Result Date: 01/09/2016 CLINICAL DATA:  70 year old female non English speaker with cough, weakness, fatigue, chest pain. Initial encounter. EXAM: CT CHEST, ABDOMEN, AND PELVIS WITH CONTRAST TECHNIQUE: Multidetector CT imaging of the chest, abdomen and pelvis was performed following the standard protocol during bolus administration of intravenous contrast. CONTRAST:  31m ISOVUE-300 IOPAMIDOL (ISOVUE-300) INJECTION 61% COMPARISON:  Chest radiographs 0612 hours today. CT Abdomen and Pelvis 05/04/2014. FINDINGS: CT CHEST FINDINGS No pericardial effusion. Central pulmonary artery enlargement to the same caliber as the ascending aorta is noted. Major mediastinal vascular structures remain patent. Mild mostly noncalcified thoracic aortic atherosclerosis. No hilar or mediastinal lymphadenopathy. Small calcified right peritracheal lymph nodes. No axillary  lymphadenopathy. Major airways are patent. In the superior segment of the left lower lobe there is consolidation with surrounding peribronchial nodularity, but also a 13 mm air-fluid collection located within the consolidated lung (series 2, image 26). There is mild narrowing of adjacent airways. There is no associated left pleural effusion. On the 2015 comparison there was right lower lobe pneumonia. Right lower lobe ventilation has significantly improved since that time but there is a new mildly thick-walled 15 mm air-fluid collection in the right lower lobe on series 3, image 96 with surrounding tree-in-bud nodularity. There is also a a 15-16 mm new costophrenic angle pulmonary nodule on that side. Calcified granuloma right middle lobe. There is mild bronchiectasis and atelectasis in the right middle lobe. There is superimposed mild lung base atelectasis. No pleural effusion. No acute osseous abnormality identified. Low-density 2.3 cm left thyroid nodule. Additional smaller coarsely calcified and hypodense bilateral thyroid nodules. No thoracic inlet lymphadenopathy. CT ABDOMEN PELVIS FINDINGS No acute osseous abnormality identified. Mildly distended urinary bladder.  Negative uterus and adnexa. Indistinct rectum with wall thickening up to 9 mm. Small volume contained stool and fluid in the rectum. Similar appearance of the sigmoid colon which is decompressed. Wall thickening continues throughout the left colon, but is less apparent at the splenic flexure. Mild wall thickening also appears to extend throughout the transverse colon and right colon. The appendix is not definitely involved. The terminal ileum is decompressed. Oral contrast was administered but has not yet reached the distal small bowel. Some proximal jejunal loops are mildly to moderately thickened (series 2, image 65). The duodenum is not definitely affected. Negative stomach. No abdominal free air or free fluid. Surgically absent gallbladder.  Stable liver with several small circumscribed low-density areas which most resemble benign cysts. Negative spleen, pancreas and adrenal glands. Portal venous system is patent. Aortoiliac calcified atherosclerosis noted. Major arterial structures are patent. Bilateral renal enhancement and contrast excretion is within normal limits. No lymphadenopathy identified. IMPRESSION: 1. Superior segment left lower lobe necrotizing pneumonia with small lung abscess (series 2, image 26). No associated left pleural effusion or thoracic lymphadenopathy. See also #4 regarding superimposed acute infection in the abdomen and pelvis. 2. Also at the site of a right lower lobe consolidation on the 2015 comparison there is a new 15 mm cavitary lesion with a small fluid level suggesting abscess, as well as surrounding tree-in-bud nodularity indicating acute infection. 3. Superimposed 16 mm solid right lower lobe lung nodule. Recommend post treatment chest CT follow-up of this finding. 4. New diffuse large bowel wall thickening in the abdomen in keeping with acute colitis. No abdominal free abscess or complicating features identified. 5. Questionable infectious involvement of proximal small bowel loops. No evidence of obstruction.  6.  Calcified aortic atherosclerosis. Electronically Signed   By: Genevie Ann M.D.   On: 01/09/2016 13:09   Ct Abdomen Pelvis W Contrast  Result Date: 01/09/2016 CLINICAL DATA:  70 year old female non English speaker with cough, weakness, fatigue, chest pain. Initial encounter. EXAM: CT CHEST, ABDOMEN, AND PELVIS WITH CONTRAST TECHNIQUE: Multidetector CT imaging of the chest, abdomen and pelvis was performed following the standard protocol during bolus administration of intravenous contrast. CONTRAST:  2m ISOVUE-300 IOPAMIDOL (ISOVUE-300) INJECTION 61% COMPARISON:  Chest radiographs 0612 hours today. CT Abdomen and Pelvis 05/04/2014. FINDINGS: CT CHEST FINDINGS No pericardial effusion. Central pulmonary artery  enlargement to the same caliber as the ascending aorta is noted. Major mediastinal vascular structures remain patent. Mild mostly noncalcified thoracic aortic atherosclerosis. No hilar or mediastinal lymphadenopathy. Small calcified right peritracheal lymph nodes. No axillary lymphadenopathy. Major airways are patent. In the superior segment of the left lower lobe there is consolidation with surrounding peribronchial nodularity, but also a 13 mm air-fluid collection located within the consolidated lung (series 2, image 26). There is mild narrowing of adjacent airways. There is no associated left pleural effusion. On the 2015 comparison there was right lower lobe pneumonia. Right lower lobe ventilation has significantly improved since that time but there is a new mildly thick-walled 15 mm air-fluid collection in the right lower lobe on series 3, image 96 with surrounding tree-in-bud nodularity. There is also a a 15-16 mm new costophrenic angle pulmonary nodule on that side. Calcified granuloma right middle lobe. There is mild bronchiectasis and atelectasis in the right middle lobe. There is superimposed mild lung base atelectasis. No pleural effusion. No acute osseous abnormality identified. Low-density 2.3 cm left thyroid nodule. Additional smaller coarsely calcified and hypodense bilateral thyroid nodules. No thoracic inlet lymphadenopathy. CT ABDOMEN PELVIS FINDINGS No acute osseous abnormality identified. Mildly distended urinary bladder.  Negative uterus and adnexa. Indistinct rectum with wall thickening up to 9 mm. Small volume contained stool and fluid in the rectum. Similar appearance of the sigmoid colon which is decompressed. Wall thickening continues throughout the left colon, but is less apparent at the splenic flexure. Mild wall thickening also appears to extend throughout the transverse colon and right colon. The appendix is not definitely involved. The terminal ileum is decompressed. Oral contrast was  administered but has not yet reached the distal small bowel. Some proximal jejunal loops are mildly to moderately thickened (series 2, image 65). The duodenum is not definitely affected. Negative stomach. No abdominal free air or free fluid. Surgically absent gallbladder. Stable liver with several small circumscribed low-density areas which most resemble benign cysts. Negative spleen, pancreas and adrenal glands. Portal venous system is patent. Aortoiliac calcified atherosclerosis noted. Major arterial structures are patent. Bilateral renal enhancement and contrast excretion is within normal limits. No lymphadenopathy identified. IMPRESSION: 1. Superior segment left lower lobe necrotizing pneumonia with small lung abscess (series 2, image 26). No associated left pleural effusion or thoracic lymphadenopathy. See also #4 regarding superimposed acute infection in the abdomen and pelvis. 2. Also at the site of a right lower lobe consolidation on the 2015 comparison there is a new 15 mm cavitary lesion with a small fluid level suggesting abscess, as well as surrounding tree-in-bud nodularity indicating acute infection. 3. Superimposed 16 mm solid right lower lobe lung nodule. Recommend post treatment chest CT follow-up of this finding. 4. New diffuse large bowel wall thickening in the abdomen in keeping with acute colitis. No abdominal free abscess or complicating features identified. 5. Questionable  infectious involvement of proximal small bowel loops. No evidence of obstruction. 6.  Calcified aortic atherosclerosis. Electronically Signed   By: Genevie Ann M.D.   On: 01/09/2016 13:09   Active Problems:   Lung abscess ([place])   Assessment/Plan 1. Lung abscess 2. Weakness and fatigue 3. Weight loss 4. Hyponatremia 5. Volume depletion 6. DM   Admit patient to Telemetry floor  Pan culture the patient  IV antibiotics  Check CRP and ANA, and low threshold for further work up if elevated  Pulmonary  consult  ECHO   Work up Hyponatremia, including TSH and Cortisol  Check albumin and prealbumin  Cautious hydration  Optimize blood sugar control  DVT prophylaxis: Biron Lovenox Code Status: Full Family Communication: Son Disposition Plan: To be determined   Consults called: Pulmonary (Dr. Baltazar Apo)   Admission status: Inpatient    Time spent: Greater than 60 minutes  Dana Allan, MD  [facility] Pager #: [phone] 7PM-7AM contact night coverage as above   01/09/2016, 2:52 PM

## 2016-01-09 NOTE — Progress Notes (Signed)
Pharmacy Antibiotic Note  Olivia Williamson is a 70 y.o. female admitted on 01/09/2016 with lung abscess.  Pharmacy has been consulted for vancomycin and cefepime dosing. Patient received first doses of abx in ED. 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 CrCl ~ 37 ml/min  Plan: -vancomycin 500 mg IV every 24 hours.  Goal trough 15-20 mcg/mL. -cefepime 1 g IV every 24 hours -monitor renal fxn, clinical status, VT at ss prn -f/u c/s    Temp (24hrs), Avg:99.4 F (37.4 C), Min:99.4 F (37.4 C), Max:99.4 F (37.4 C)   Recent Labs Lab 01/09/16 0515  WBC 7.7  CREATININE 0.64    Estimated Creatinine Clearance: 37.7 mL/min (by C-G formula based on SCr of 0.8 mg/dL).    No Known Allergies  Antimicrobials this admission: 8/17 vanc >>  8/17 cefepime >>   Microbiology results: 8/17 BCx:   Thank you for allowing pharmacy to be a part of this patient's care.   Mackie Paienee Beva Remund, PharmD PGY1 Pharmacy Resident Pager: [phone] 01/09/2016 5:52 PM

## 2016-01-09 NOTE — ED Notes (Addendum)
Cleaned pt and placed clean linen on pt bed, placed pt in brief.

## 2016-01-09 NOTE — Progress Notes (Signed)
Pharmacy Antibiotic Note  Olivia Williamson is a 70 y.o. female admitted on 01/09/2016 with pneumonia.  Pharmacy has been consulted for cefepime dosing. Pt wt 35 kg, WBC 7.7, creat 0.64, creat cl ~ 47 ml/min.  AF.  8/17 chest CT: LLL necrotizing PNA w/ small lung abscess, new diffuse large bowel wall thickening c/w acute colitis.   Plan: Cefepime 1 gm IV q24 Vanc 1 gm x 1 dose has been ordered in ED, f/u for orders to continue vancomycin F/u renal fxn, wbc, temp, culture data    Temp (24hrs), Avg:99.4 F (37.4 C), Min:99.4 F (37.4 C), Max:99.4 F (37.4 C)   Recent Labs Lab 01/09/16 0515  WBC 7.7  CREATININE 0.64    Estimated Creatinine Clearance: 37.7 mL/min (by C-G formula based on SCr of 0.8 mg/dL).    No Known Allergies  Thank you for allowing pharmacy to be a part of this patient's care.  Herby AbrahamMichelle T. Jerriyah Louis, Pharm.D. [phone] 01/09/2016 2:04 PM

## 2016-01-09 NOTE — ED Notes (Signed)
Pt CBG 132. Nurse informed.

## 2016-01-09 NOTE — ED Triage Notes (Addendum)
Pt. reports left chest pain radiating to upper back with SOB , occasional dry cough, fatigue , generalized weakness and bilateral feet pain onset last week . Denies emesis or diaphoresis .

## 2016-01-09 NOTE — ED Notes (Signed)
Patient transported to CT 

## 2016-01-09 NOTE — ED Notes (Signed)
EDP at bedside  

## 2016-01-09 NOTE — Consult Note (Signed)
Name: Olivia Williamson MRN: [id number] DOB: 1945-07-10    ADMISSION DATE:  01/09/2016 CONSULTATION DATE:  8/17  REFERRING MD :  Marthenia Rolling ([facility])   CHIEF COMPLAINT:  Abnormal chest CT  BRIEF PATIENT DESCRIPTION: 70yo non english speaking female been in [place] x 10 years, with hx uncontrolled DM, GERD, HTN who presented 8/17 with chest pain, abd pain, weight loss and generalized weakness.  W/u revealed multiple cavitary lung nodules and PCCM consulted.   SIGNIFICANT EVENTS    STUDIES:  CT chest/abd/pelvis 8/17>>> 1. Superior segment left lower lobe necrotizing pneumonia with small lung abscess (series 2, image 26). No associated left pleural effusion or thoracic lymphadenopathy.  2. Also at the site of a right lower lobe consolidation on the 2015 comparison there is a new 15 mm cavitary lesion with a small fluid level suggesting abscess, as well as surrounding tree-in-bud nodularity indicating acute infection. 3. Superimposed 16 mm solid right lower lobe lung nodule. Recommend post treatment chest CT follow-up of this finding. 4. New diffuse large bowel wall thickening in the abdomen in keeping with acute colitis. No abdominal free abscess or complicating features identified. 5. Questionable infectious involvement of proximal small bowel loops. No evidence of obstruction.    HISTORY OF PRESENT ILLNESS:  70yo non english speaking female been in [place] x 10 years with hx uncontrolled DM, GERD, HTN who presented 8/17 with chest pain, abd pain, weight loss and generalized weakness.  W/u revealed multiple lung abscesses and PCCM consulted.   Denies fevers, chills, hemoptysis, night sweats, known TB exposure.   PAST MEDICAL HISTORY :   has a past medical history of Arthritis; Diabetes mellitus without complication ([place]); GERD (gastroesophageal reflux disease); and Hypertension.  has a past surgical history that includes Colonoscopy; No past surgeries; and Cholecystectomy (N/A, 05/02/2014). Prior to  Admission medications   Medication Sig Start Date End Date Taking? Authorizing Provider  blood glucose meter kit and supplies KIT Dispense based on patient and insurance preference. Use up to four times daily as directed. (FOR ICD-9 250.00, 250.01). 12/16/15  Yes Silver Huguenin Elgergawy, MD  feeding supplement, GLUCERNA SHAKE, (GLUCERNA SHAKE) LIQD Take 237 mLs by mouth 2 (two) times daily between meals. 12/16/15  Yes Albertine Patricia, MD  Ferrous Gluconate 324 (37.5 Fe) MG TABS Take 1 tablet (324 mg total) by mouth daily. 12/30/15  Yes Maren Reamer, MD  gabapentin (NEURONTIN) 300 MG capsule Take 2 capsules (600 mg total) by mouth 3 (three) times daily. 12/30/15  Yes Maren Reamer, MD  glucose blood test strip Use as instructed 12/30/15  Yes Maren Reamer, MD  Insulin Glargine (LANTUS SOLOSTAR) 100 UNIT/ML Solostar Pen Inject 20 Units into the skin daily at 10 pm. 12/30/15  Yes Maren Reamer, MD  Insulin Pen Needle (ULTICARE MICRO PEN NEEDLES) 32G X 4 MM MISC 1 applicator by Does not apply route at bedtime. 12/30/15  Yes Maren Reamer, MD  Lancet Devices [facility]) lancets Use as instructed 12/30/15  Yes Maren Reamer, MD  metFORMIN (GLUCOPHAGE) 1000 MG tablet Take 1 tablet (1,000 mg total) by mouth 2 (two) times daily with a meal. 12/30/15  Yes Maren Reamer, MD  Vitamin D, Ergocalciferol, (DRISDOL) 50000 units CAPS capsule Take 1 capsule (50,000 Units total) by mouth every 7 (seven) days. 12/31/15  Yes Maren Reamer, MD   No Known Allergies  FAMILY HISTORY:  family history is not on file. SOCIAL HISTORY:  reports that she has never smoked.  She has never used smokeless tobacco. She reports that she does not drink alcohol or use drugs.  REVIEW OF SYSTEMS:   As per HPI - All other systems reviewed and were neg.    SUBJECTIVE:   VITAL SIGNS: Temp:  [99.4 F (37.4 C)] 99.4 F (37.4 C) (08/17 0509) Pulse Rate:  [76-87] 85 (08/17 1500) Resp:  [11-22] 14 (08/17 1500) BP:  (133-145)/(52-88) 133/52 (08/17 1500) SpO2:  [97 %-100 %] 98 % (08/17 1500)  PHYSICAL EXAMINATION: General:  Small, frail, cachectic female, NAD  Neuro:  Awake, alert, appropriate, MAE  HEENT:  Mm moist, no JVD  Cardiovascular:  s1s2 rrr Lungs:  resps even non labored, coarse throughout  Abdomen:  Soft, non tender, +bs  Musculoskeletal:  Warm and dry, no edema    Recent Labs Lab 01/09/16 0515  NA 133*  K 4.4  CL 97*  CO2 27  BUN 27*  CREATININE 0.64  GLUCOSE 136*    Recent Labs Lab 01/09/16 0515  HGB 9.7*  HCT 29.0*  WBC 7.7  PLT 169   Dg Chest 2 View  Result Date: 01/09/2016 CLINICAL DATA:  Left-sided anterior and posterior chest pain and bilateral foot pain. History of diabetes, smoker, hypertension. EXAM: CHEST  2 VIEW COMPARISON:  None. FINDINGS: Normal heart size and pulmonary vascularity. Multiple pulmonary nodules are demonstrated. There is a 15 mm cavitary nodule in the right lung base, a 15 mm solid nodule more laterally in the right lung base, and a 13 mm solid nodule projected over the posterior mid lung on the lateral view. Appearances are worrisome for primary or metastatic disease and CT is recommended for further evaluation. No blunting of costophrenic angles. No pneumothorax. Mediastinal contours appear intact. Calcification of the aorta. Surgical clips in the right upper quadrant. IMPRESSION: Three pulmonary nodules identified. Primary or metastatic disease is suspected. CT recommended for further evaluation. Electronically Signed   By: Lucienne Capers M.D.   On: 01/09/2016 06:32   Ct Chest W Contrast  Result Date: 01/09/2016 CLINICAL DATA:  70 year old female non English speaker with cough, weakness, fatigue, chest pain. Initial encounter. EXAM: CT CHEST, ABDOMEN, AND PELVIS WITH CONTRAST TECHNIQUE: Multidetector CT imaging of the chest, abdomen and pelvis was performed following the standard protocol during bolus administration of intravenous contrast.  CONTRAST:  11m ISOVUE-300 IOPAMIDOL (ISOVUE-300) INJECTION 61% COMPARISON:  Chest radiographs 0612 hours today. CT Abdomen and Pelvis 05/04/2014. FINDINGS: CT CHEST FINDINGS No pericardial effusion. Central pulmonary artery enlargement to the same caliber as the ascending aorta is noted. Major mediastinal vascular structures remain patent. Mild mostly noncalcified thoracic aortic atherosclerosis. No hilar or mediastinal lymphadenopathy. Small calcified right peritracheal lymph nodes. No axillary lymphadenopathy. Major airways are patent. In the superior segment of the left lower lobe there is consolidation with surrounding peribronchial nodularity, but also a 13 mm air-fluid collection located within the consolidated lung (series 2, image 26). There is mild narrowing of adjacent airways. There is no associated left pleural effusion. On the 2015 comparison there was right lower lobe pneumonia. Right lower lobe ventilation has significantly improved since that time but there is a new mildly thick-walled 15 mm air-fluid collection in the right lower lobe on series 3, image 96 with surrounding tree-in-bud nodularity. There is also a a 15-16 mm new costophrenic angle pulmonary nodule on that side. Calcified granuloma right middle lobe. There is mild bronchiectasis and atelectasis in the right middle lobe. There is superimposed mild lung base atelectasis. No pleural effusion. No acute  osseous abnormality identified. Low-density 2.3 cm left thyroid nodule. Additional smaller coarsely calcified and hypodense bilateral thyroid nodules. No thoracic inlet lymphadenopathy. CT ABDOMEN PELVIS FINDINGS No acute osseous abnormality identified. Mildly distended urinary bladder.  Negative uterus and adnexa. Indistinct rectum with wall thickening up to 9 mm. Small volume contained stool and fluid in the rectum. Similar appearance of the sigmoid colon which is decompressed. Wall thickening continues throughout the left colon, but is  less apparent at the splenic flexure. Mild wall thickening also appears to extend throughout the transverse colon and right colon. The appendix is not definitely involved. The terminal ileum is decompressed. Oral contrast was administered but has not yet reached the distal small bowel. Some proximal jejunal loops are mildly to moderately thickened (series 2, image 65). The duodenum is not definitely affected. Negative stomach. No abdominal free air or free fluid. Surgically absent gallbladder. Stable liver with several small circumscribed low-density areas which most resemble benign cysts. Negative spleen, pancreas and adrenal glands. Portal venous system is patent. Aortoiliac calcified atherosclerosis noted. Major arterial structures are patent. Bilateral renal enhancement and contrast excretion is within normal limits. No lymphadenopathy identified. IMPRESSION: 1. Superior segment left lower lobe necrotizing pneumonia with small lung abscess (series 2, image 26). No associated left pleural effusion or thoracic lymphadenopathy. See also #4 regarding superimposed acute infection in the abdomen and pelvis. 2. Also at the site of a right lower lobe consolidation on the 2015 comparison there is a new 15 mm cavitary lesion with a small fluid level suggesting abscess, as well as surrounding tree-in-bud nodularity indicating acute infection. 3. Superimposed 16 mm solid right lower lobe lung nodule. Recommend post treatment chest CT follow-up of this finding. 4. New diffuse large bowel wall thickening in the abdomen in keeping with acute colitis. No abdominal free abscess or complicating features identified. 5. Questionable infectious involvement of proximal small bowel loops. No evidence of obstruction. 6.  Calcified aortic atherosclerosis. Electronically Signed   By: Genevie Ann M.D.   On: 01/09/2016 13:09   Ct Abdomen Pelvis W Contrast  Result Date: 01/09/2016 CLINICAL DATA:  70 year old female non English speaker with  cough, weakness, fatigue, chest pain. Initial encounter. EXAM: CT CHEST, ABDOMEN, AND PELVIS WITH CONTRAST TECHNIQUE: Multidetector CT imaging of the chest, abdomen and pelvis was performed following the standard protocol during bolus administration of intravenous contrast. CONTRAST:  27m ISOVUE-300 IOPAMIDOL (ISOVUE-300) INJECTION 61% COMPARISON:  Chest radiographs 0612 hours today. CT Abdomen and Pelvis 05/04/2014. FINDINGS: CT CHEST FINDINGS No pericardial effusion. Central pulmonary artery enlargement to the same caliber as the ascending aorta is noted. Major mediastinal vascular structures remain patent. Mild mostly noncalcified thoracic aortic atherosclerosis. No hilar or mediastinal lymphadenopathy. Small calcified right peritracheal lymph nodes. No axillary lymphadenopathy. Major airways are patent. In the superior segment of the left lower lobe there is consolidation with surrounding peribronchial nodularity, but also a 13 mm air-fluid collection located within the consolidated lung (series 2, image 26). There is mild narrowing of adjacent airways. There is no associated left pleural effusion. On the 2015 comparison there was right lower lobe pneumonia. Right lower lobe ventilation has significantly improved since that time but there is a new mildly thick-walled 15 mm air-fluid collection in the right lower lobe on series 3, image 96 with surrounding tree-in-bud nodularity. There is also a a 15-16 mm new costophrenic angle pulmonary nodule on that side. Calcified granuloma right middle lobe. There is mild bronchiectasis and atelectasis in the right middle lobe. There  is superimposed mild lung base atelectasis. No pleural effusion. No acute osseous abnormality identified. Low-density 2.3 cm left thyroid nodule. Additional smaller coarsely calcified and hypodense bilateral thyroid nodules. No thoracic inlet lymphadenopathy. CT ABDOMEN PELVIS FINDINGS No acute osseous abnormality identified. Mildly distended  urinary bladder.  Negative uterus and adnexa. Indistinct rectum with wall thickening up to 9 mm. Small volume contained stool and fluid in the rectum. Similar appearance of the sigmoid colon which is decompressed. Wall thickening continues throughout the left colon, but is less apparent at the splenic flexure. Mild wall thickening also appears to extend throughout the transverse colon and right colon. The appendix is not definitely involved. The terminal ileum is decompressed. Oral contrast was administered but has not yet reached the distal small bowel. Some proximal jejunal loops are mildly to moderately thickened (series 2, image 65). The duodenum is not definitely affected. Negative stomach. No abdominal free air or free fluid. Surgically absent gallbladder. Stable liver with several small circumscribed low-density areas which most resemble benign cysts. Negative spleen, pancreas and adrenal glands. Portal venous system is patent. Aortoiliac calcified atherosclerosis noted. Major arterial structures are patent. Bilateral renal enhancement and contrast excretion is within normal limits. No lymphadenopathy identified. IMPRESSION: 1. Superior segment left lower lobe necrotizing pneumonia with small lung abscess (series 2, image 26). No associated left pleural effusion or thoracic lymphadenopathy. See also #4 regarding superimposed acute infection in the abdomen and pelvis. 2. Also at the site of a right lower lobe consolidation on the 2015 comparison there is a new 15 mm cavitary lesion with a small fluid level suggesting abscess, as well as surrounding tree-in-bud nodularity indicating acute infection. 3. Superimposed 16 mm solid right lower lobe lung nodule. Recommend post treatment chest CT follow-up of this finding. 4. New diffuse large bowel wall thickening in the abdomen in keeping with acute colitis. No abdominal free abscess or complicating features identified. 5. Questionable infectious involvement of  proximal small bowel loops. No evidence of obstruction. 6.  Calcified aortic atherosclerosis. Electronically Signed   By: Genevie Ann M.D.   On: 01/09/2016 13:09    ASSESSMENT / PLAN:  Multiple cavitary lung nodules - Unclear etiology.  Associated with weight loss, chest pain. No fevers, chills, hemoptysis, night sweats.   PLAN -  Agree with broad spectrum abx  Will plan FOB at 1pm 8/18 Cultures, AFB, fungal smears on BAL  NPO after MN  F/u CXR    DM  Hyponatremia  PLAN -  Per primary    Nickolas Madrid, NP 01/09/2016  3:45 PM Pager: [phone] or [phone]  Attending Note:  I have examined patient, reviewed labs, studies and notes. I have discussed the case with Shon Millet, and I agree with the data and plans as amended above. 70 yo [place] woman with a hx of severe DM, poorly controlled. Has had wt loss, malaise, failure to thrive. Came for evaluation of severe weakness and wt loss, abdominal pain and possibly chest pain (laguage barrier makes it difficult). A CT chest shows B LL nodules, cavitation in the L sided nodule. Etiology unclear - also unclear whether these findings are relevant to her complaints. I believe we need to have a broad DDx for these nodules, most importantly r/o infection including fungal process, AFB. Agree with current abx. Recommend airborne isolation. We will work to perform bronchoscopy on 8/18, scheduled for 1:00pm. Getting consent may be complicated given the language issue. We will work to get more effective translation.  Baltazar Apo, MD, PhD 01/09/2016, 3:46 PM [facility] [phone] or if no answer [phone]

## 2016-01-10 ENCOUNTER — Inpatient Hospital Stay (HOSPITAL_COMMUNITY): Payer: Medicare Other

## 2016-01-10 ENCOUNTER — Encounter (HOSPITAL_COMMUNITY): Payer: Self-pay | Admitting: *Deleted

## 2016-01-10 ENCOUNTER — Encounter (HOSPITAL_COMMUNITY)
Admission: EM | Disposition: A | Discharge status: Home Health | Payer: Self-pay | Source: Home / Self Care | Attending: Internal Medicine

## 2016-01-10 DIAGNOSIS — D509 Iron deficiency anemia, unspecified: Secondary | ICD-10-CM

## 2016-01-10 DIAGNOSIS — J851 Abscess of lung with pneumonia: Secondary | ICD-10-CM

## 2016-01-10 DIAGNOSIS — E119 Type 2 diabetes mellitus without complications: Secondary | ICD-10-CM | POA: Diagnosis not present

## 2016-01-10 DIAGNOSIS — Z794 Long term (current) use of insulin: Secondary | ICD-10-CM

## 2016-01-10 DIAGNOSIS — E43 Unspecified severe protein-calorie malnutrition: Secondary | ICD-10-CM | POA: Diagnosis not present

## 2016-01-10 DIAGNOSIS — R9389 Abnormal findings on diagnostic imaging of other specified body structures: Secondary | ICD-10-CM

## 2016-01-10 DIAGNOSIS — R938 Abnormal findings on diagnostic imaging of other specified body structures: Secondary | ICD-10-CM | POA: Diagnosis not present

## 2016-01-10 DIAGNOSIS — R079 Chest pain, unspecified: Secondary | ICD-10-CM | POA: Diagnosis not present

## 2016-01-10 DIAGNOSIS — J850 Gangrene and necrosis of lung: Secondary | ICD-10-CM | POA: Diagnosis not present

## 2016-01-10 HISTORY — PX: VIDEO BRONCHOSCOPY: SHX5072

## 2016-01-10 LAB — BASIC METABOLIC PANEL
Anion gap: 9 (ref 5–15)
BUN: 14 mg/dL (ref 6–20)
CO2: 28 mmol/L (ref 22–32)
Calcium: 8.9 mg/dL (ref 8.9–10.3)
Chloride: 96 mmol/L — ABNORMAL LOW (ref 101–111)
Creatinine, Ser: 0.75 mg/dL (ref 0.44–1.00)
GFR calc Af Amer: >60 mL/min (ref >60)
GFR calc non Af Amer: >60 mL/min (ref >60)
Glucose, Bld: 216 mg/dL — ABNORMAL HIGH (ref 65–99)
Potassium: 3.5 mmol/L (ref 3.5–5.1)
Sodium: 133 mmol/L — ABNORMAL LOW (ref 135–145)

## 2016-01-10 LAB — CREATININE, SERUM
Creatinine, Ser: 0.74 mg/dL (ref 0.44–1.00)
GFR calc Af Amer: >60 mL/min (ref >60)
GFR calc non Af Amer: >60 mL/min (ref >60)

## 2016-01-10 LAB — CBC
HCT: 27.7 % — ABNORMAL LOW (ref 36.0–46.0)
Hemoglobin: 8.7 g/dL — ABNORMAL LOW (ref 12.0–15.0)
MCH: 20.2 pg — ABNORMAL LOW (ref 26.0–34.0)
MCHC: 31.4 g/dL (ref 30.0–36.0)
MCV: 64.3 fL — ABNORMAL LOW (ref 78.0–100.0)
Platelets: 141 10*3/uL — ABNORMAL LOW (ref 150–400)
RBC: 4.31 MIL/uL (ref 3.87–5.11)
RDW: 15.9 % — ABNORMAL HIGH (ref 11.5–15.5)
WBC: 6.3 10*3/uL (ref 4.0–10.5)

## 2016-01-10 LAB — BODY FLUID CELL COUNT WITH DIFFERENTIAL
EOS FL: 0 %
Lymphs, Fluid: 13 %
MONOCYTE-MACROPHAGE-SEROUS FLUID: 7 % — AB (ref 50–90)
Neutrophil Count, Fluid: 80 % — ABNORMAL HIGH (ref 0–25)
Total Nucleated Cell Count, Fluid: 390 cu mm (ref 0–1000)

## 2016-01-10 LAB — SODIUM, URINE, RANDOM: Sodium, Ur: 86 mmol/L

## 2016-01-10 LAB — SEDIMENTATION RATE: Sed Rate: 21 mm/hr (ref 0–22)

## 2016-01-10 LAB — OSMOLALITY, URINE: Osmolality, Ur: 611 mOsm/kg (ref 300–900)

## 2016-01-10 LAB — PHOSPHORUS: Phosphorus: 4.1 mg/dL (ref 2.5–4.6)

## 2016-01-10 LAB — C-REACTIVE PROTEIN: CRP: 0.5 mg/dL (ref <1.0)

## 2016-01-10 LAB — GLUCOSE, CAPILLARY
Glucose-Capillary: 202 mg/dL — ABNORMAL HIGH (ref 65–99)
Glucose-Capillary: 57 mg/dL — ABNORMAL LOW (ref 65–99)
Glucose-Capillary: 170 mg/dL — ABNORMAL HIGH (ref 65–99)
Glucose-Capillary: 123 mg/dL — ABNORMAL HIGH (ref 65–99)
Glucose-Capillary: 264 mg/dL — ABNORMAL HIGH (ref 65–99)

## 2016-01-10 LAB — OSMOLALITY: Osmolality: 290 mOsm/kg (ref 275–295)

## 2016-01-10 LAB — MAGNESIUM: Magnesium: 1.5 mg/dL — ABNORMAL LOW (ref 1.7–2.4)

## 2016-01-10 LAB — CORTISOL: Cortisol, Plasma: 2.4 ug/dL

## 2016-01-10 SURGERY — VIDEO BRONCHOSCOPY WITHOUT FLUORO
Anesthesia: Moderate Sedation | Laterality: Bilateral

## 2016-01-10 MED ORDER — SODIUM CHLORIDE 0.9 % IV SOLN
INTRAVENOUS | Status: DC
  Administered 2016-01-10: 14:00:00 via INTRAVENOUS

## 2016-01-10 MED ORDER — HYDROCODONE-ACETAMINOPHEN 5-325 MG PO TABS
2.0000 | ORAL_TABLET | Freq: Once | ORAL | Status: AC
  Administered 2016-01-10: 2 via ORAL
  Filled 2016-01-10: qty 2

## 2016-01-10 MED ORDER — MIDAZOLAM HCL 10 MG/2ML IJ SOLN
INTRAMUSCULAR | Status: DC | PRN
  Administered 2016-01-10: 2 mg via INTRAVENOUS

## 2016-01-10 MED ORDER — FENTANYL CITRATE (PF) 100 MCG/2ML IJ SOLN
INTRAMUSCULAR | Status: AC
  Filled 2016-01-10: qty 4

## 2016-01-10 MED ORDER — PNEUMOCOCCAL VAC POLYVALENT 25 MCG/0.5ML IJ INJ
0.5000 mL | INJECTION | INTRAMUSCULAR | Status: AC
  Administered 2016-01-11: 0.5 mL via INTRAMUSCULAR
  Filled 2016-01-10: qty 0.5

## 2016-01-10 MED ORDER — PHENYLEPHRINE HCL 0.25 % NA SOLN
NASAL | Status: DC | PRN
  Administered 2016-01-10: 2 via NASAL

## 2016-01-10 MED ORDER — DEXTROSE 50 % IV SOLN
INTRAVENOUS | Status: AC
Start: 2016-01-10 — End: 2016-01-10
  Administered 2016-01-10: 50 mL
  Filled 2016-01-10: qty 50

## 2016-01-10 MED ORDER — LIDOCAINE HCL 2 % EX GEL: 1.0000 "application " | Freq: Once | CUTANEOUS | Status: DC

## 2016-01-10 MED ORDER — FENTANYL CITRATE (PF) 100 MCG/2ML IJ SOLN
INTRAMUSCULAR | Status: DC | PRN
  Administered 2016-01-10: 50 ug via INTRAVENOUS

## 2016-01-10 MED ORDER — LIDOCAINE HCL (PF) 1 % IJ SOLN
INTRAMUSCULAR | Status: DC | PRN
  Administered 2016-01-10: 6 mL

## 2016-01-10 MED ORDER — DEXTROSE 50 % IV SOLN: 50.0000 mL | Freq: Once | INTRAVENOUS | Status: AC

## 2016-01-10 MED ORDER — PHENYLEPHRINE HCL 0.25 % NA SOLN
1.0000 | Freq: Four times a day (QID) | NASAL | Status: DC | PRN
  Filled 2016-01-10: qty 15

## 2016-01-10 MED ORDER — LIDOCAINE HCL 2 % EX GEL
CUTANEOUS | Status: DC | PRN
  Administered 2016-01-10: 1

## 2016-01-10 MED ORDER — MIDAZOLAM HCL 5 MG/ML IJ SOLN
INTRAMUSCULAR | Status: AC
  Filled 2016-01-10: qty 2

## 2016-01-10 NOTE — Progress Notes (Signed)
Inpatient Diabetes Program Recommendations  AACE/ADA: New Consensus Statement on Inpatient Glycemic Control (2015)  Target Ranges:  Prepandial:   less than 140 mg/dL      Peak postprandial:   less than 180 mg/dL (1-2 hours)      Critically ill patients:  140 - 180 mg/dL   [facility] [id number]) as of 01/10/2016 11:01  Ref. Range 01/09/2016 14:24 01/09/2016 22:29 01/10/2016 08:57  Glucose-Capillary Latest Ref Range: 65 - 99 mg/dL 696132 (H) 295163 (H) 284202 (H)  Results [facility], [facility] ([facility] [id number]) as of 01/10/2016 11:01  Ref. Range 12/15/2015 00:30  Hemoglobin A1C Latest Ref Range: 4.8 - 5.6 % 12.2 (H)   Review of Glycemic Control  Outpatient Diabetes medications: Lantus 20 units QHS, Metformin 1000 mg BID Current orders for Inpatient glycemic control: Lantus 20 units QHS, Novolog 0-9 units TID with meals, Novoog 0-5 units QHS  Inpatient Diabetes Program Recommendations: HgbA1C: A1C was 12.2% on 12/15/15 and patient was seen by Inpatient Diabetes Coordinator on 12/16/15 during last hospitalization. Will continue to follow while inpatient.  Thanks, Orlando PennerMarie Helina Hullum, RN, MSN, CDE Diabetes Coordinator Inpatient Diabetes Program [phone] (Team Pager from 8am to 5pm) [phone] ([facility] office) [phone] [facility] office) [phone] [facility] office)

## 2016-01-10 NOTE — Op Note (Signed)
[facility] Patient Name: Olivia MiniumMoihh Concannon Pocedure Date: 01/10/2016 MRN: [id number] Attending MD: Leslye Peerobert S Jaylun Fleener , MD Date of Birth: 1945/08/04 CSN: Finalized Age: 70 Admit Type: Inpatient Gender: Female Procedure:            Bronchoscopy Indications:          Abnormal CT scan of chest, Left lower lobe cavitary                        lesion, Right lower lobe nodule Providers:            Leslye Peerobert S. Shavanna Furnari, MD, Ruthell Rummagearol Blackstock RRT,RCP, Mat CarneJustin                        Hopper RRT,RCP Referring MD:          Medicines:            Fentanyl 50 mcg IV, Midazolam 2 mg IV, Lidocaine 1%                        applied to cords 10 mL, Lidocaine 1% applied to the                        tracheobronchial tree 5 mL, Oxygen 3 L/min Complications:        No immediate complications Estimated Blood Loss: Estimated blood loss: none. Procedure:            Pre-Anesthesia Assessment:                       - A History and Physical has been performed. Patient                        meds and allergies have been reviewed. The risks and                        benefits of the procedure and the sedation options and                        risks were discussed with the patient. All questions                        were answered and informed consent was obtained.                        Patient identification and proposed procedure were                        verified prior to the procedure by the physician in the                        procedure room. Mental Status Examination: alert and                        oriented. Airway Examination: small/crowded                        oropharyngeal airway. Respiratory Examination: clear to                        auscultation. CV Examination: RRR, no murmurs,  no S3 or                        S4. ASA Grade Assessment: II - A patient with mild                        systemic disease. After reviewing the risks and                        benefits, the patient  was deemed in satisfactory                        condition to undergo the procedure. The anesthesia plan                        was to use moderate sedation / analgesia (conscious                        sedation). Immediately prior to administration of                        medications, the patient was re-assessed for adequacy                        to receive sedatives. The heart rate, respiratory rate,                        oxygen saturations, blood pressure, adequacy of                        pulmonary ventilation, and response to care were                        monitored throughout the procedure. The physical status                        of the patient was re-assessed after the procedure.                       After obtaining informed consent, the bronchoscope was                        passed under direct vision. Throughout the procedure,                        the patient's blood pressure, pulse, and oxygen                        saturations were monitored continuously. the [id number]                        [id number] scope was introduced through the left nostril                        and advanced to the tracheobronchial tree. The                        procedure was accomplished without difficulty. The  patient tolerated the procedure well. The total                        duration of the procedure was 8 hours and 8 minutes. Scope In: 2:20:03 PM Scope Out: 2:24:33 PM Findings:      The nasopharynx/oropharynx appears normal. The larynx appears normal.       The vocal cords appear normal. The subglottic space is normal. The       trachea is of normal caliber. The carina is sharp. The tracheobronchial       tree was examined to at least the first subsegmental level. Bronchial       mucosa and anatomy are normal; there are no endobronchial lesions, and       no secretions.      Bronchoalveolar lavage was performed in the LLL superior segment (B6) of       the  lung and sent for cell count, bacterial culture, viral smears &       culture, and fungal & AFB analysis and cytology. 60 mL of fluid were       instilled. 20 mL were returned. The return was cloudy. There were no       mucoid plugs in the return fluid. Impression:           - Abnormal CT scan of chest                       - Left lower lobe cavitary lesion                       - Right lower lobe nodule                       - The examination was normal.                       - Bronchoalveolar lavage was performed.                       - The examination was normal. Moderate Sedation:      Moderate (conscious) sedation was personally administered by the       endoscopist. The following parameters were monitored: oxygen saturation,       heart rate, blood pressure, respiratory rate, EKG, adequacy of pulmonary       ventilation, and response to care. Total physician intraservice time was       10 minutes. Recommendation:       - Await BAL, culture and cytology results. Procedure Code(s):    --- Professional ---                       [phone], Bronchoscopy, rigid or flexible, including                        fluoroscopic guidance, when performed; with bronchial                        alveolar lavage Diagnosis Code(s):    --- Professional ---                       R91.8, Other nonspecific abnormal finding of lung field  R91.1, Solitary pulmonary nodule                       R93.8, Abnormal findings on diagnostic imaging of other                        specified body structures CPT copyright 2016 [employer] Medical Association. All rights reserved. The codes documented in this report are preliminary and upon coder review may  be revised to meet current compliance requirements. Leslye Peerobert S. Merel Santoli, MD Leslye Peerobert S Juleah Paradise, MD 01/10/2016 2:38:36 PM Number of Addenda: 0

## 2016-01-10 NOTE — Progress Notes (Signed)
PROGRESS NOTE        PATIENT DETAILS Name: Olivia Williamson Age: 70 y.o. Sex: female Date of Birth: 02/11/1946 Admit Date: 01/09/2016 Admitting Physician Barnetta ChapelSylvester I Ogbata, MD ZOX:[id number]:Dawn Marland Mcalpine Langeland, MD  Brief Narrative: Patient is a 70 y.o. female with past medical history of diabetes, GERD, severe protein calorie malnutrition presented to the ED on 8/17 with cough, shortness of breath and left-sided chest pain. CT chest on admission showed multiple lung abscesses. She was admitted and started on empiric antibiotics, pulmonology was consulted, plans are for bronchoscopy on 8/18.  Subjective: Continues to have some left-sided chest pain. Looks very frail and weak.  Assessment/Plan: Active Problems: Multiple  Lung abscess: Continue empiric antibiotics (Vanco/cefepime), pulmonology planning on bronchoscopy today. Continue on isolation until FAB smears are negative on BAL. We'll need to follow BAL cultures, and taper antibiotics accordingly.  Left-sided chest pain: Secondary to above, very atypical for cardiac etiology. EKG and enzymes negative. Supportive care.  Mild hyponatremia: Very mild-without any further workup required at this time. Follow.  Hypomagnesemia: Repleted and recheck  Anemia: Although microcytic-most recent iron panel on 7/23 showed a ferritin of 245 and iron level of 34. Suspect multifactorial-continue iron supplementation  Insulin-dependent Type 2 diabetes: CBGs relatively stable, continue 20 units of Lantus and SSI. Resume metformin on discharge.  Severe protein calorie malnutrition: Continue supplements  DVT Prophylaxis: SCD's for now-resume pharmacological prophylaxis after bronchoscopy has been completed.  Code Status: Full code   Family Communication: Son at bedside was speaks some [place].  Disposition Plan: Remain inpatient-we will likely require hospitalization through the weekend, suspect home with home services sometime next  week  Antimicrobial agents: IV vancomycin 8/17>> IV cefepime 8/17>>  Procedures: None  CONSULTS:  pulmonary/intensive care  Time spent: 25 minutes-Greater than 50% of this time was spent in counseling, explanation of diagnosis, planning of further management, and coordination of care.  MEDICATIONS: Anti-infectives    Start     Dose/Rate Route Frequency Ordered Stop   01/10/16 1500  vancomycin (VANCOCIN) 500 mg in sodium chloride 0.9 % 100 mL IVPB     500 mg 66.7 mL/hr over 90 Minutes Intravenous Every 24 hours 01/09/16 1755     01/10/16 1500  ceFEPIme (MAXIPIME) 1 g in dextrose 5 % 50 mL IVPB     1 g 100 mL/hr over 30 Minutes Intravenous Every 24 hours 01/09/16 1755     01/09/16 1400  ceFEPIme (MAXIPIME) 1 g in dextrose 5 % 50 mL IVPB     1 g 100 mL/hr over 30 Minutes Intravenous Every 24 hours 01/09/16 1358     01/09/16 1345  vancomycin (VANCOCIN) IVPB 1000 mg/200 mL premix     1,000 mg 200 mL/hr over 60 Minutes Intravenous  Once 01/09/16 1341 01/09/16 1731      Scheduled Meds: . ceFEPime (MAXIPIME) IV  1 g Intravenous Q24H  . ceFEPime (MAXIPIME) IV  1 g Intravenous Q24H  . feeding supplement (GLUCERNA SHAKE)  237 mL Oral BID BM  . ferrous gluconate  324 mg Oral Q breakfast  . gabapentin  600 mg Oral TID  . insulin aspart  0-5 Units Subcutaneous QHS  . insulin aspart  0-9 Units Subcutaneous TID WC  . insulin glargine  20 Units Subcutaneous Q2200  . [START ON 01/11/2016] pneumococcal 23 valent vaccine  0.5 mL Intramuscular Tomorrow-1000  . vancomycin  500 mg Intravenous Q24H  . Vitamin D (Ergocalciferol)  50,000 Units Oral Q Fri   Continuous Infusions: . sodium chloride 75 mL/hr at 01/10/16 0103   PRN Meds:.   PHYSICAL EXAM: Vital signs: Vitals:   01/09/16 2115 01/09/16 2243 01/10/16 0435 01/10/16 0900  BP: 104/92 (!) 144/54 (!) 128/55 (!) 114/52  Pulse: 88 79 61 75  Resp: 21 20 18 18   Temp:  98.4 F (36.9 C) 97.1 F (36.2 C) 97.9 F (36.6 C)  TempSrc:   Oral Oral Oral  SpO2: 97% 99% 96% 96%  Weight:  72.1 kg (158 lb 15.2 oz)    Height:  5\' 2"  (1.575 m)     Filed Weights   01/09/16 2243  Weight: 72.1 kg (158 lb 15.2 oz)   Body mass index is 29.07 kg/m.   Gen Exam: Awake and alert with clear speech. Not in any distress . Chronically ill appearing and very cachectic. Neck: Supple, No JVD.   Chest: B/L Clear.   CVS: S1 S2 Regular Abdomen: soft, BS +, non tender, non distended.  Extremities: no edema, lower extremities warm to touch. Neurologic: Non Focal.   Skin: No Rash or lesions   Wounds: N/A.  I have personally reviewed following labs and imaging studies  LABORATORY DATA: CBC:  Recent Labs Lab 01/09/16 0515 01/10/16 0322  WBC 7.7 6.3  HGB 9.7* 8.7*  HCT 29.0* 27.7*  MCV 63.2* 64.3*  PLT 169 141*    Basic Metabolic Panel:  Recent Labs Lab 01/09/16 0515 01/10/16 0322  NA 133* 133*  K 4.4 3.5  CL 97* 96*  CO2 27 28  GLUCOSE 136* 216*  BUN 27* 14  CREATININE 0.64 0.74  0.75  CALCIUM 9.5 8.9  MG  --  1.5*  PHOS  --  4.1    GFR: Estimated Creatinine Clearance: 63.5 mL/min (by C-G formula based on SCr of 0.8 mg/dL).  Liver Function Tests:  Recent Labs Lab 01/09/16 1830  ALBUMIN 3.2*   No results for input(s): LIPASE, AMYLASE in the last 168 hours. No results for input(s): AMMONIA in the last 168 hours.  Coagulation Profile: No results for input(s): INR, PROTIME in the last 168 hours.  Cardiac Enzymes:  Recent Labs Lab 01/09/16 0747 01/09/16 1205  TROPONINI <0.03 <0.03    BNP (last 3 results) No results for input(s): PROBNP in the last 8760 hours.  HbA1C: No results for input(s): HGBA1C in the last 72 hours.  CBG:  Recent Labs Lab 01/09/16 1424 01/09/16 2229 01/10/16 0857  GLUCAP 132* 163* 202*    Lipid Profile: No results for input(s): CHOL, HDL, LDLCALC, TRIG, CHOLHDL, LDLDIRECT in the last 72 hours.  Thyroid Function Tests: No results for input(s): TSH, T4TOTAL, FREET4,  T3FREE, THYROIDAB in the last 72 hours.  Anemia Panel: No results for input(s): VITAMINB12, FOLATE, FERRITIN, TIBC, IRON, RETICCTPCT in the last 72 hours.  Urine analysis:    Component Value Date/Time   COLORURINE YELLOW 01/09/2016 0518   APPEARANCEUR CLEAR 01/09/2016 0518   LABSPEC 1.021 01/09/2016 0518   PHURINE 5.5 01/09/2016 0518   GLUCOSEU NEGATIVE 01/09/2016 0518   HGBUR NEGATIVE 01/09/2016 0518   BILIRUBINUR NEGATIVE 01/09/2016 0518   KETONESUR NEGATIVE 01/09/2016 0518   PROTEINUR 100 (A) 01/09/2016 0518   NITRITE NEGATIVE 01/09/2016 0518   LEUKOCYTESUR MODERATE (A) 01/09/2016 0518    Sepsis Labs: Lactic Acid, Venous    Component Value Date/Time   LATICACIDVEN 1.39 01/09/2016 1842    MICROBIOLOGY: No results found  for this or any previous visit (from the past 240 hour(s)).  RADIOLOGY STUDIES/RESULTS: Dg Chest 2 View  Result Date: 01/09/2016 CLINICAL DATA:  Left-sided anterior and posterior chest pain and bilateral foot pain. History of diabetes, smoker, hypertension. EXAM: CHEST  2 VIEW COMPARISON:  None. FINDINGS: Normal heart size and pulmonary vascularity. Multiple pulmonary nodules are demonstrated. There is a 15 mm cavitary nodule in the right lung base, a 15 mm solid nodule more laterally in the right lung base, and a 13 mm solid nodule projected over the posterior mid lung on the lateral view. Appearances are worrisome for primary or metastatic disease and CT is recommended for further evaluation. No blunting of costophrenic angles. No pneumothorax. Mediastinal contours appear intact. Calcification of the aorta. Surgical clips in the right upper quadrant. IMPRESSION: Three pulmonary nodules identified. Primary or metastatic disease is suspected. CT recommended for further evaluation. Electronically Signed   By: Burman Nieves M.D.   On: 01/09/2016 06:32   Ct Chest W Contrast  Result Date: 01/09/2016 CLINICAL DATA:  70 year old female non English speaker with  cough, weakness, fatigue, chest pain. Initial encounter. EXAM: CT CHEST, ABDOMEN, AND PELVIS WITH CONTRAST TECHNIQUE: Multidetector CT imaging of the chest, abdomen and pelvis was performed following the standard protocol during bolus administration of intravenous contrast. CONTRAST:  75mL ISOVUE-300 IOPAMIDOL (ISOVUE-300) INJECTION 61% COMPARISON:  Chest radiographs 0612 hours today. CT Abdomen and Pelvis 05/04/2014. FINDINGS: CT CHEST FINDINGS No pericardial effusion. Central pulmonary artery enlargement to the same caliber as the ascending aorta is noted. Major mediastinal vascular structures remain patent. Mild mostly noncalcified thoracic aortic atherosclerosis. No hilar or mediastinal lymphadenopathy. Small calcified right peritracheal lymph nodes. No axillary lymphadenopathy. Major airways are patent. In the superior segment of the left lower lobe there is consolidation with surrounding peribronchial nodularity, but also a 13 mm air-fluid collection located within the consolidated lung (series 2, image 26). There is mild narrowing of adjacent airways. There is no associated left pleural effusion. On the 2015 comparison there was right lower lobe pneumonia. Right lower lobe ventilation has significantly improved since that time but there is a new mildly thick-walled 15 mm air-fluid collection in the right lower lobe on series 3, image 96 with surrounding tree-in-bud nodularity. There is also a a 15-16 mm new costophrenic angle pulmonary nodule on that side. Calcified granuloma right middle lobe. There is mild bronchiectasis and atelectasis in the right middle lobe. There is superimposed mild lung base atelectasis. No pleural effusion. No acute osseous abnormality identified. Low-density 2.3 cm left thyroid nodule. Additional smaller coarsely calcified and hypodense bilateral thyroid nodules. No thoracic inlet lymphadenopathy. CT ABDOMEN PELVIS FINDINGS No acute osseous abnormality identified. Mildly distended  urinary bladder.  Negative uterus and adnexa. Indistinct rectum with wall thickening up to 9 mm. Small volume contained stool and fluid in the rectum. Similar appearance of the sigmoid colon which is decompressed. Wall thickening continues throughout the left colon, but is less apparent at the splenic flexure. Mild wall thickening also appears to extend throughout the transverse colon and right colon. The appendix is not definitely involved. The terminal ileum is decompressed. Oral contrast was administered but has not yet reached the distal small bowel. Some proximal jejunal loops are mildly to moderately thickened (series 2, image 65). The duodenum is not definitely affected. Negative stomach. No abdominal free air or free fluid. Surgically absent gallbladder. Stable liver with several small circumscribed low-density areas which most resemble benign cysts. Negative spleen, pancreas and adrenal glands.  Portal venous system is patent. Aortoiliac calcified atherosclerosis noted. Major arterial structures are patent. Bilateral renal enhancement and contrast excretion is within normal limits. No lymphadenopathy identified. IMPRESSION: 1. Superior segment left lower lobe necrotizing pneumonia with small lung abscess (series 2, image 26). No associated left pleural effusion or thoracic lymphadenopathy. See also #4 regarding superimposed acute infection in the abdomen and pelvis. 2. Also at the site of a right lower lobe consolidation on the 2015 comparison there is a new 15 mm cavitary lesion with a small fluid level suggesting abscess, as well as surrounding tree-in-bud nodularity indicating acute infection. 3. Superimposed 16 mm solid right lower lobe lung nodule. Recommend post treatment chest CT follow-up of this finding. 4. New diffuse large bowel wall thickening in the abdomen in keeping with acute colitis. No abdominal free abscess or complicating features identified. 5. Questionable infectious involvement of  proximal small bowel loops. No evidence of obstruction. 6.  Calcified aortic atherosclerosis. Electronically Signed   By: Odessa Fleming M.D.   On: 01/09/2016 13:09   Ct Abdomen Pelvis W Contrast  Result Date: 01/09/2016 CLINICAL DATA:  70 year old female non English speaker with cough, weakness, fatigue, chest pain. Initial encounter. EXAM: CT CHEST, ABDOMEN, AND PELVIS WITH CONTRAST TECHNIQUE: Multidetector CT imaging of the chest, abdomen and pelvis was performed following the standard protocol during bolus administration of intravenous contrast. CONTRAST:  75mL ISOVUE-300 IOPAMIDOL (ISOVUE-300) INJECTION 61% COMPARISON:  Chest radiographs 0612 hours today. CT Abdomen and Pelvis 05/04/2014. FINDINGS: CT CHEST FINDINGS No pericardial effusion. Central pulmonary artery enlargement to the same caliber as the ascending aorta is noted. Major mediastinal vascular structures remain patent. Mild mostly noncalcified thoracic aortic atherosclerosis. No hilar or mediastinal lymphadenopathy. Small calcified right peritracheal lymph nodes. No axillary lymphadenopathy. Major airways are patent. In the superior segment of the left lower lobe there is consolidation with surrounding peribronchial nodularity, but also a 13 mm air-fluid collection located within the consolidated lung (series 2, image 26). There is mild narrowing of adjacent airways. There is no associated left pleural effusion. On the 2015 comparison there was right lower lobe pneumonia. Right lower lobe ventilation has significantly improved since that time but there is a new mildly thick-walled 15 mm air-fluid collection in the right lower lobe on series 3, image 96 with surrounding tree-in-bud nodularity. There is also a a 15-16 mm new costophrenic angle pulmonary nodule on that side. Calcified granuloma right middle lobe. There is mild bronchiectasis and atelectasis in the right middle lobe. There is superimposed mild lung base atelectasis. No pleural effusion. No  acute osseous abnormality identified. Low-density 2.3 cm left thyroid nodule. Additional smaller coarsely calcified and hypodense bilateral thyroid nodules. No thoracic inlet lymphadenopathy. CT ABDOMEN PELVIS FINDINGS No acute osseous abnormality identified. Mildly distended urinary bladder.  Negative uterus and adnexa. Indistinct rectum with wall thickening up to 9 mm. Small volume contained stool and fluid in the rectum. Similar appearance of the sigmoid colon which is decompressed. Wall thickening continues throughout the left colon, but is less apparent at the splenic flexure. Mild wall thickening also appears to extend throughout the transverse colon and right colon. The appendix is not definitely involved. The terminal ileum is decompressed. Oral contrast was administered but has not yet reached the distal small bowel. Some proximal jejunal loops are mildly to moderately thickened (series 2, image 65). The duodenum is not definitely affected. Negative stomach. No abdominal free air or free fluid. Surgically absent gallbladder. Stable liver with several small circumscribed low-density areas  which most resemble benign cysts. Negative spleen, pancreas and adrenal glands. Portal venous system is patent. Aortoiliac calcified atherosclerosis noted. Major arterial structures are patent. Bilateral renal enhancement and contrast excretion is within normal limits. No lymphadenopathy identified. IMPRESSION: 1. Superior segment left lower lobe necrotizing pneumonia with small lung abscess (series 2, image 26). No associated left pleural effusion or thoracic lymphadenopathy. See also #4 regarding superimposed acute infection in the abdomen and pelvis. 2. Also at the site of a right lower lobe consolidation on the 2015 comparison there is a new 15 mm cavitary lesion with a small fluid level suggesting abscess, as well as surrounding tree-in-bud nodularity indicating acute infection. 3. Superimposed 16 mm solid right lower  lobe lung nodule. Recommend post treatment chest CT follow-up of this finding. 4. New diffuse large bowel wall thickening in the abdomen in keeping with acute colitis. No abdominal free abscess or complicating features identified. 5. Questionable infectious involvement of proximal small bowel loops. No evidence of obstruction. 6.  Calcified aortic atherosclerosis. Electronically Signed   By: Odessa FlemingH  Hall M.D.   On: 01/09/2016 13:09     LOS: 1 day   Jeoffrey MassedGHIMIRE,Latham Kinzler, MD  [facility] Pager:[phone]  If 7PM-7AM, please contact night-coverage [email or web address] Password [email or web address] 01/10/2016, 11:11 AM

## 2016-01-10 NOTE — Progress Notes (Signed)
Initial Nutrition Assessment  DOCUMENTATION CODES:   Underweight  INTERVENTION:  Continue Glucerna Shake po BID, each supplement provides 220 kcal and 10 grams of protein.  Encourage adequate PO intake.   NUTRITION DIAGNOSIS:   Increased nutrient needs related to acute illness as evidenced by estimated needs.  GOAL:   Patient will meet greater than or equal to 90% of their needs  MONITOR:   PO intake, Supplement acceptance, Labs, Weight trends, Skin, I & O's  REASON FOR ASSESSMENT:   Malnutrition Screening Tool    ASSESSMENT:   70 y.o. female with past medical history of diabetes, GERD, severe protein calorie malnutrition presented to the ED on 8/17 with cough, shortness of breath and left-sided chest pain.  CT chest done on presentation revealed multiple lung abscesses..Bronchoscopy today.  Pt was in a procedure during attempted time of visit. RD unable to obtain most recent nutrition history. Per Epic weight records, pt with a 5% weight loss in 11 days. Pt currently has Glucerna Shake ordered. RD to continue with current orders. Unable to complete Nutrition-Focused physical exam at this time. RD to perform physical exam at next visit.   Labs and medications reviewed. Magnesium low at 1.5.  Diet Order:  Diet Carb Modified Fluid consistency: Thin; Room service appropriate? Yes  Skin:  Reviewed, no issues  Last BM:  8/18  Height:   Ht Readings from Last 1 Encounters:  01/09/16 5\' 2"  (1.575 m)    Weight:   Wt Readings from Last 1 Encounters:  01/10/16 73 lb 10.1 oz (33.4 kg)    Ideal Body Weight:  50 kg  BMI:  Body mass index is 13.47 kg/m.  Estimated Nutritional Needs:   Kcal:  1250-1450  Protein:  50-60 grams  Fluid:  >/= 1.5 L/day  EDUCATION NEEDS:   No education needs identified at this time  Roslyn SmilingStephanie Bennet Kujawa, MS, RD, LDN Pager # [phone] After hours/ weekend pager # [phone]

## 2016-01-10 NOTE — Progress Notes (Signed)
Video bronchoscopy performed Intervention bronchial washings Pt tolerated well  Jacory Kamel David RRT  

## 2016-01-11 ENCOUNTER — Inpatient Hospital Stay (HOSPITAL_COMMUNITY): Payer: Medicare Other

## 2016-01-11 DIAGNOSIS — Z23 Encounter for immunization: Secondary | ICD-10-CM | POA: Diagnosis not present

## 2016-01-11 DIAGNOSIS — M264 Malocclusion, unspecified: Secondary | ICD-10-CM | POA: Diagnosis not present

## 2016-01-11 DIAGNOSIS — R0902 Hypoxemia: Secondary | ICD-10-CM | POA: Diagnosis not present

## 2016-01-11 DIAGNOSIS — K529 Noninfective gastroenteritis and colitis, unspecified: Secondary | ICD-10-CM | POA: Diagnosis not present

## 2016-01-11 DIAGNOSIS — E43 Unspecified severe protein-calorie malnutrition: Secondary | ICD-10-CM | POA: Diagnosis not present

## 2016-01-11 DIAGNOSIS — E869 Volume depletion, unspecified: Secondary | ICD-10-CM | POA: Diagnosis not present

## 2016-01-11 DIAGNOSIS — R0789 Other chest pain: Secondary | ICD-10-CM

## 2016-01-11 DIAGNOSIS — R627 Adult failure to thrive: Secondary | ICD-10-CM | POA: Diagnosis not present

## 2016-01-11 DIAGNOSIS — R011 Cardiac murmur, unspecified: Secondary | ICD-10-CM

## 2016-01-11 DIAGNOSIS — R64 Cachexia: Secondary | ICD-10-CM | POA: Diagnosis not present

## 2016-01-11 DIAGNOSIS — Z794 Long term (current) use of insulin: Secondary | ICD-10-CM | POA: Diagnosis not present

## 2016-01-11 DIAGNOSIS — R079 Chest pain, unspecified: Secondary | ICD-10-CM | POA: Diagnosis present

## 2016-01-11 DIAGNOSIS — J852 Abscess of lung without pneumonia: Secondary | ICD-10-CM | POA: Diagnosis not present

## 2016-01-11 DIAGNOSIS — E871 Hypo-osmolality and hyponatremia: Secondary | ICD-10-CM | POA: Diagnosis not present

## 2016-01-11 DIAGNOSIS — J850 Gangrene and necrosis of lung: Secondary | ICD-10-CM | POA: Diagnosis not present

## 2016-01-11 DIAGNOSIS — D509 Iron deficiency anemia, unspecified: Secondary | ICD-10-CM | POA: Diagnosis not present

## 2016-01-11 DIAGNOSIS — I10 Essential (primary) hypertension: Secondary | ICD-10-CM | POA: Diagnosis not present

## 2016-01-11 DIAGNOSIS — E1165 Type 2 diabetes mellitus with hyperglycemia: Secondary | ICD-10-CM | POA: Diagnosis not present

## 2016-01-11 DIAGNOSIS — K219 Gastro-esophageal reflux disease without esophagitis: Secondary | ICD-10-CM | POA: Diagnosis not present

## 2016-01-11 DIAGNOSIS — Z681 Body mass index (BMI) 19 or less, adult: Secondary | ICD-10-CM | POA: Diagnosis not present

## 2016-01-11 DIAGNOSIS — R938 Abnormal findings on diagnostic imaging of other specified body structures: Secondary | ICD-10-CM | POA: Diagnosis not present

## 2016-01-11 DIAGNOSIS — J851 Abscess of lung with pneumonia: Secondary | ICD-10-CM | POA: Diagnosis not present

## 2016-01-11 DIAGNOSIS — K0530 Chronic periodontitis, unspecified: Secondary | ICD-10-CM | POA: Diagnosis not present

## 2016-01-11 LAB — GLUCOSE, CAPILLARY
Glucose-Capillary: 88 mg/dL (ref 65–99)
Glucose-Capillary: 177 mg/dL — ABNORMAL HIGH (ref 65–99)
Glucose-Capillary: 250 mg/dL — ABNORMAL HIGH (ref 65–99)
Glucose-Capillary: 268 mg/dL — ABNORMAL HIGH (ref 65–99)

## 2016-01-11 LAB — CBC
HEMATOCRIT: 27.6 % — AB (ref 36.0–46.0)
HEMOGLOBIN: 8.7 g/dL — AB (ref 12.0–15.0)
MCH: 20.4 pg — ABNORMAL LOW (ref 26.0–34.0)
MCHC: 31.5 g/dL (ref 30.0–36.0)
MCV: 64.8 fL — ABNORMAL LOW (ref 78.0–100.0)
Platelets: 150 10*3/uL (ref 150–400)
RBC: 4.26 MIL/uL (ref 3.87–5.11)
RDW: 16.3 % — ABNORMAL HIGH (ref 11.5–15.5)
WBC: 6.1 10*3/uL (ref 4.0–10.5)

## 2016-01-11 LAB — BASIC METABOLIC PANEL
ANION GAP: 8 (ref 5–15)
BUN: 10 mg/dL (ref 6–20)
CHLORIDE: 105 mmol/L (ref 101–111)
CO2: 26 mmol/L (ref 22–32)
CREATININE: 0.54 mg/dL (ref 0.44–1.00)
Calcium: 9 mg/dL (ref 8.9–10.3)
GFR calc non Af Amer: >60 mL/min (ref >60)
Glucose, Bld: 64 mg/dL — ABNORMAL LOW (ref 65–99)
POTASSIUM: 3.6 mmol/L (ref 3.5–5.1)
SODIUM: 139 mmol/L (ref 135–145)

## 2016-01-11 MED ORDER — ENOXAPARIN SODIUM 30 MG/0.3ML ~~LOC~~ SOLN
20.0000 mg | SUBCUTANEOUS | Status: DC
  Administered 2016-01-12 – 2016-01-15 (×4): 20 mg via SUBCUTANEOUS
  Filled 2016-01-11 (×5): qty 0.3

## 2016-01-11 MED ORDER — HYDROCODONE-ACETAMINOPHEN 5-325 MG PO TABS
1.0000 | ORAL_TABLET | Freq: Once | ORAL | Status: AC
  Administered 2016-01-11: 1 via ORAL
  Filled 2016-01-11: qty 1

## 2016-01-11 NOTE — Progress Notes (Signed)
  Echocardiogram 2D Echocardiogram has been performed.  Arvil ChacoFoster, Yanique Mulvihill 01/11/2016, 4:11 PM

## 2016-01-11 NOTE — Evaluation (Signed)
Physical Therapy Evaluation Patient Details Name: Yehuda BuddMoihh Franqui MRN: [id number] DOB: 03-06-1946 Today's Date: 01/11/2016   History of Present Illness  70 yo female with onset of weight loss and weakness was admitted, has lung abscesses and history of DM, neuropathy, HTN and possible memory changes  Clinical Impression  Pt was able to tolerate short walks but with her fatigue of lung condition did not get to try the walker.  Her plan is to check her with walker and will have follow up therapy at home to increase her balance and safety and minimize fall risk, as her neuropathy puts her at greater risk.  Will plan to check out RW next visit.    Follow Up Recommendations Home health PT;Supervision for mobility/OOB    Equipment Recommendations  Rolling walker with 5" wheels    Recommendations for Other Services Rehab consult     Precautions / Restrictions Precautions Precautions: Fall (telemetry) Restrictions Weight Bearing Restrictions: No      Mobility  Bed Mobility Overal bed mobility: Modified Independent                Transfers Overall transfer level: Needs assistance Equipment used: 1 person hand held assist Transfers: Sit to/from Stand;Stand Pivot Transfers Sit to Stand: Min guard Stand pivot transfers: Min guard (reminders for IV lines, pt is not attending to them)          Ambulation/Gait Ambulation/Gait assistance: Min guard Ambulation Distance (Feet): 20 Feet Assistive device: 1 person hand held assist Gait Pattern/deviations: Step-through pattern;Antalgic;Trunk flexed;Narrow base of support;Decreased stride length Gait velocity: reduced Gait velocity interpretation: Below normal speed for age/gender General Gait Details: Pt is walking with slow steps and with contact of PT on her hand can manage balance  Stairs            Wheelchair Mobility    Modified Rankin (Stroke Patients Only)       Balance Overall balance assessment: Needs  assistance Sitting-balance support: Feet supported Sitting balance-Leahy Scale: Fair     Standing balance support: Single extremity supported Standing balance-Leahy Scale: Fair                               Pertinent Vitals/Pain Pain Assessment: Faces Pain Score: 6  Faces Pain Scale: Hurts even more Pain Location: B feet and chest Pain Descriptors / Indicators: Aching;Tender;Shooting;Sharp Pain Intervention(s): Limited activity within patient's tolerance;Monitored during session;Repositioned (provided a walker)    Home Living Family/patient expects to be discharged to:: Private residence Living Arrangements: Children Available Help at Discharge: Family;Available 24 hours/day Type of Home: House Home Access: Stairs to enter Entrance Stairs-Rails: None Entrance Stairs-Number of Steps: 2 Home Layout: One level Home Equipment: Cane - single point Additional Comments: Has two sons who live with her.  One works night shift and the other works day shift so between the two of them they should be able to provide 24/7 assist/supervision.    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[place])  Cognition Arousal/Alertness: Awake/alert Behavior During Therapy: Flat affect Overall Cognitive Status: Within Functional Limits for tasks assessed                      General Comments      Exercises        Assessment/Plan    PT Assessment Patient needs continued PT services  PT Diagnosis Difficulty  walking;Acute pain   PT Problem List Decreased strength;Decreased range of motion;Decreased activity tolerance;Decreased balance;Decreased mobility;Decreased coordination;Decreased knowledge of use of DME;Decreased safety awareness;Decreased knowledge of precautions;Cardiopulmonary status limiting activity;Pain  PT Treatment Interventions DME instruction;Gait training;Stair training;Functional mobility training;Therapeutic activities;Therapeutic exercise;Balance training;Neuromuscular re-education;Patient/family education   PT Goals (Current goals can be found in the Care Plan section) Acute Rehab PT Goals Patient Stated Goal: to walk PT Goal Formulation: With patient/family Time For Goal Achievement: 01/25/16 Potential to Achieve Goals: Good    Frequency Min 3X/week   Barriers to discharge Inaccessible home environment has steps with no rails to enter    Co-evaluation               End of Session   Activity Tolerance: Patient limited by pain;Patient limited by fatigue Patient left: in bed;with call bell/phone within reach;with family/visitor present Nurse Communication: Mobility status         Time: [id number] PT Time Calculation (min) (ACUTE ONLY): 29 min   Charges:   PT Evaluation $PT Eval Low Complexity: 1 Procedure PT Treatments $Gait Training: 8-22 mins   PT G Codes:        Ivar DrapeStout, Espyn Radwan E 01/11/2016, 1:45 PM  Samul Dadauth Avry [place], PT MS Acute Rehab Dept. Number: [facility] [phone] and [facility] [phone]

## 2016-01-11 NOTE — Plan of Care (Signed)
Problem: Pain Managment: Goal: General experience of comfort will improve Outcome: Progressing Patient reports pain 6/10 this morning. One time dose hydrocodone/apap given during night. Will continue to monitor.   Problem: Nutrition: Goal: Adequate nutrition will be maintained Outcome: Progressing Encouraged patient with family assistance to eat breakfast. Discussed with family the importance of nutrition. Patient eating breakfast at this time. Will continue to monitor.

## 2016-01-11 NOTE — Progress Notes (Signed)
PROGRESS NOTE    Olivia Williamson  NFA:[id number] DOB: November 04, 1945 DOA: 01/09/2016 PCP: Pete Glatterawn T Langeland, MD    Brief Narrative:  Patient is a 70 y.o. female with past medical history of diabetes, GERD, severe protein calorie malnutrition presented to the ED on 8/17 with cough, shortness of breath and left-sided chest pain. CT chest on admission showed multiple lung abscesses. She was admitted and started on empiric antibiotics, pulmonology was consulted, plans are for bronchoscopy on 8/18.   Assessment & Plan:   Multiple Lung abscess (HCC) - On empiric Abx of Vanc and cefepime - Bronchoscopy yesterday, pending results - Follow cultures - On airborne precautions until TB testing comes back  - No fevers or leukocytosis  Left sided chest pain - Atypical, 2/2 to above abscesses - Pain control with norco  Mild asymptomatic hyponatremia - Resolved  Anemia, microcytic - Anemia panel 1 month ago was relatively normal and microcytosis was present - Target cells on previous smear - ? Thalassemia - Hgb 8.7 today, stable, Trend  DM2 - CBG ranging from 50s-250s depending on if she eats.  - Continue lantus and SSI - Also on metformin at home.   HTN - BP relatively well controlled, on no meds - Monitor.  Consider amlodipine if needed  Severe protein calorie malnutrition - Continue supplements   DVT prophylaxis: SCDs, resume lovenox now that bronchoscopy done.  Code Status: Full Family Communication: Son at bedside Disposition Plan: Pending Bronch results  Consultants:   PCCM  DM coordinator  Procedures:   Bronchoscopy 8/18  Antimicrobials:   Vancomycin 8/17 --> now  Cefepime 8/17 --> now    Subjective: Patient reports doing well through son.  She has left sided chest pain.   Objective: Vitals:   01/10/16 2104 01/11/16 0534 01/11/16 0827 01/11/16 1616  BP: 122/60 (!) 124/51 (!) 122/55 (!) 138/50  Pulse: 88 82 83 88  Resp: 17 18 16 18   Temp: 98.4 F (36.9 C) 97.7  F (36.5 C) 98.4 F (36.9 C) 98.2 F (36.8 C)  TempSrc: Oral Oral Oral Oral  SpO2: 97% 97% 95% 96%  Weight: 78 lb 0.7 oz (35.4 kg)     Height:        Intake/Output Summary (Last 24 hours) at 01/11/16 1717 Last data filed at 01/11/16 1659  Gross per 24 hour  Intake           2927.5 ml  Output              902 ml  Net           2025.5 ml   Filed Weights   01/09/16 2243 01/10/16 1522 01/10/16 2104  Weight: 158 lb 15.2 oz (72.1 kg) 73 lb 10.1 oz (33.4 kg) 78 lb 0.7 oz (35.4 kg)    Examination:  General exam: Appears calm and comfortable, frail elderly woman Eyes: Anicteric sclerae, non injected conjunctivae Respiratory system: Rhonchi in left bases, no wheezing, respiratory effort normal.  Cardiovascular system: S1 & S2 heard, RR, NR Gastrointestinal system: Abdomen is scaphoid, soft and nontender. Normal bowel sounds heard. Central nervous system: Alert and oriented. No gross neurological deficits. Extremities: Symmetric 5 x 5 power. Skin: No rashes, lesions or ulcers    Data Reviewed:  CBC:  Recent Labs Lab 01/09/16 0515 01/10/16 0322 01/11/16 0503  WBC 7.7 6.3 6.1  HGB 9.7* 8.7* 8.7*  HCT 29.0* 27.7* 27.6*  MCV 63.2* 64.3* 64.8*  PLT 169 141* 150   Basic Metabolic Panel:  Recent  Labs Lab 01/09/16 0515 01/10/16 0322 01/11/16 0503  NA 133* 133* 139  K 4.4 3.5 3.6  CL 97* 96* 105  CO2 27 28 26   GLUCOSE 136* 216* 64*  BUN 27* 14 10  CREATININE 0.64 0.74  0.75 0.54  CALCIUM 9.5 8.9 9.0  MG  --  1.5*  --   PHOS  --  4.1  --    GFR: Estimated Creatinine Clearance: 38.1 mL/min (by C-G formula based on SCr of 0.8 mg/dL). Liver Function Tests:  Recent Labs Lab 01/09/16 1830  ALBUMIN 3.2*   No results for input(s): LIPASE, AMYLASE in the last 168 hours. No results for input(s): AMMONIA in the last 168 hours. Coagulation Profile: No results for input(s): INR, PROTIME in the last 168 hours. Cardiac Enzymes:  Recent Labs Lab 01/09/16 0747  01/09/16 1205  TROPONINI <0.03 <0.03   BNP (last 3 results) No results for input(s): PROBNP in the last 8760 hours. HbA1C: No results for input(s): HGBA1C in the last 72 hours. CBG:  Recent Labs Lab 01/10/16 1733 01/10/16 2053 01/11/16 0826 01/11/16 1226 01/11/16 1612  GLUCAP 123* 264* 88 177* 250*   Lipid Profile: No results for input(s): CHOL, HDL, LDLCALC, TRIG, CHOLHDL, LDLDIRECT in the last 72 hours. Thyroid Function Tests: No results for input(s): TSH, T4TOTAL, FREET4, T3FREE, THYROIDAB in the last 72 hours. Anemia Panel: No results for input(s): VITAMINB12, FOLATE, FERRITIN, TIBC, IRON, RETICCTPCT in the last 72 hours. Sepsis Labs:  Recent Labs Lab 01/09/16 1842  LATICACIDVEN 1.39    Recent Results (from the past 240 hour(s))  Blood culture (routine x 2)     Status: None (Preliminary result)   Collection Time: 01/09/16  1:45 PM  Result Value Ref Range Status   Specimen Description BLOOD RIGHT HAND  Final   Special Requests BOTTLES DRAWN AEROBIC AND ANAEROBIC 5CC  Final   Culture NO GROWTH 2 DAYS  Final   Report Status PENDING  Incomplete  Blood culture (routine x 2)     Status: None (Preliminary result)   Collection Time: 01/09/16  1:50 PM  Result Value Ref Range Status   Specimen Description BLOOD LEFT HAND  Final   Special Requests BOTTLES DRAWN AEROBIC AND ANAEROBIC 5CC  Final   Culture NO GROWTH 2 DAYS  Final   Report Status PENDING  Incomplete  Culture, bal-quantitative     Status: None (Preliminary result)   Collection Time: 01/10/16  2:23 PM  Result Value Ref Range Status   Specimen Description BRONCHIAL ALVEOLAR LAVAGE  Final   Special Requests LLL  Final   Gram Stain   Final    ABUNDANT WBC PRESENT,BOTH PMN AND MONONUCLEAR RARE GRAM POSITIVE COCCI IN CLUSTERS IN PAIRS RARE GRAM NEGATIVE RODS    Culture CULTURE REINCUBATED FOR BETTER GROWTH  Final   Report Status PENDING  Incomplete         Radiology Studies: No results  found.      Scheduled Meds: . ceFEPime (MAXIPIME) IV  1 g Intravenous Q24H  . feeding supplement (GLUCERNA SHAKE)  237 mL Oral BID BM  . ferrous gluconate  324 mg Oral Q breakfast  . gabapentin  600 mg Oral TID  . insulin aspart  0-5 Units Subcutaneous QHS  . insulin aspart  0-9 Units Subcutaneous TID WC  . insulin glargine  20 Units Subcutaneous Q2200  . vancomycin  500 mg Intravenous Q24H  . Vitamin D (Ergocalciferol)  50,000 Units Oral Q Fri   Continuous Infusions: .  sodium chloride 75 mL/hr at 01/11/16 0630  . sodium chloride 10 mL/hr at 01/10/16 1356     LOS: 2 days    Time spent: 25 minutes    Debe Coder, MD [facility] Pager [phone]  If 7PM-7AM, please contact night-coverage [email or web address] Password [facility] 01/11/2016, 5:17 PM

## 2016-01-11 NOTE — Progress Notes (Signed)
ANTICOAGULATION CONSULT NOTE - Initial Consult  Pharmacy Consult for Lovenox Indication: VTE prophylaxis  No Known Allergies  Patient Measurements: Height: 5\' 2"  (157.5 cm) Weight: 78 lb 0.7 oz (35.4 kg) IBW/kg (Calculated) : 50.1  Vital Signs: Temp: 98.2 F (36.8 C) (08/19 1616) Temp Source: Oral (08/19 1616) BP: 138/50 (08/19 1616) Pulse Rate: 88 (08/19 1616)  Labs:  Recent Labs  01/09/16 0515 01/09/16 0747 01/09/16 1205 01/10/16 0322 01/11/16 0503  HGB 9.7*  --   --  8.7* 8.7*  HCT 29.0*  --   --  27.7* 27.6*  PLT 169  --   --  141* 150  CREATININE 0.64  --   --  0.74  0.75 0.54  TROPONINI  --  <0.03 <0.03  --   --     Estimated Creatinine Clearance: 38.1 mL/min (by C-G formula based on SCr of 0.8 mg/dL).   Medical History: Past Medical History:  Diagnosis Date  . Arthritis   . Diabetes mellitus without complication (HCC)    stopped DM meds 2004  . GERD (gastroesophageal reflux disease)   . Hypertension    meds stopped 1 week ago , need to get renewed    Assessment: 70 yo f admitted with lung abscesses.  Pharmacy is consulted to dose Lovenox for VTE ppx s/p bronch. Pt weight is quite low at 35 kg. Will adjust dose for low body weight.   Goal of Therapy:  Monitor platelets by anticoagulation protocol: Yes   Plan:  - Lovenox 20 mg sq q24h - Pharmacy will sign off, please re- consult if needed  Thank you!  Rayya Yagi L. Roseanne RenoStewart, PharmD Clinical Pharmacist Pager: [phone] 01/11/2016 5:42 PM

## 2016-01-12 ENCOUNTER — Encounter (HOSPITAL_COMMUNITY): Payer: Self-pay | Admitting: Emergency Medicine

## 2016-01-12 DIAGNOSIS — J851 Abscess of lung with pneumonia: Secondary | ICD-10-CM | POA: Diagnosis not present

## 2016-01-12 DIAGNOSIS — R0789 Other chest pain: Secondary | ICD-10-CM | POA: Diagnosis not present

## 2016-01-12 DIAGNOSIS — J850 Gangrene and necrosis of lung: Secondary | ICD-10-CM | POA: Diagnosis not present

## 2016-01-12 DIAGNOSIS — R079 Chest pain, unspecified: Secondary | ICD-10-CM

## 2016-01-12 DIAGNOSIS — R938 Abnormal findings on diagnostic imaging of other specified body structures: Secondary | ICD-10-CM | POA: Diagnosis not present

## 2016-01-12 LAB — ECHOCARDIOGRAM COMPLETE
Height: 62 in
WEIGHTICAEL: 1248.69 [oz_av]

## 2016-01-12 LAB — CBC
HCT: 25.9 % — ABNORMAL LOW (ref 36.0–46.0)
Hemoglobin: 8.1 g/dL — ABNORMAL LOW (ref 12.0–15.0)
MCH: 20.1 pg — AB (ref 26.0–34.0)
MCHC: 31.3 g/dL (ref 30.0–36.0)
MCV: 64.4 fL — ABNORMAL LOW (ref 78.0–100.0)
PLATELETS: 132 10*3/uL — AB (ref 150–400)
RBC: 4.02 MIL/uL (ref 3.87–5.11)
RDW: 15.9 % — AB (ref 11.5–15.5)
WBC: 8.5 10*3/uL (ref 4.0–10.5)

## 2016-01-12 LAB — GLUCOSE, CAPILLARY
Glucose-Capillary: 115 mg/dL — ABNORMAL HIGH (ref 65–99)
Glucose-Capillary: 249 mg/dL — ABNORMAL HIGH (ref 65–99)
Glucose-Capillary: 319 mg/dL — ABNORMAL HIGH (ref 65–99)
Glucose-Capillary: 338 mg/dL — ABNORMAL HIGH (ref 65–99)

## 2016-01-12 MED ORDER — ACETAMINOPHEN 325 MG PO TABS
650.0000 mg | ORAL_TABLET | Freq: Four times a day (QID) | ORAL | Status: DC | PRN
  Administered 2016-01-12 – 2016-01-13 (×2): 650 mg via ORAL
  Filled 2016-01-12 (×2): qty 2

## 2016-01-12 NOTE — Progress Notes (Signed)
Pharmacy Antibiotic Note  Olivia Williamson is a 70 y.o. female admitted on 01/09/2016 with multiple lung abscesses.  Pharmacy has been consulted for Vancomycin and Cefepime dosing. Pt with very low body weight (~35 kg), s/p bronch, awaiting cultures. Will check VT tomorrow.   8/17 Chest CT: LLL necrotizing PNA w/ small lung abscess with superimposed acute infection in the abdomen and pelvis, also 15 mm cavitary lesion with small fluid; solid right LLL lung nodule, acute colitis  Plan: - Continue cefepime 1 gm IV q24h - Continue vanc 500 mg IV q24h - VT tomorrow @ 1430 - F/u cultures, watch renal fx, weight  Height: 5\' 2"  (157.5 cm) Weight: 78 lb 14.8 oz (35.8 kg) IBW/kg (Calculated) : 50.1  Temp (24hrs), Avg:98.8 F (37.1 C), Min:98.2 F (36.8 C), Max:99.8 F (37.7 C)   Recent Labs Lab 01/09/16 0515 01/09/16 1842 01/10/16 0322 01/11/16 0503 01/12/16 0520  WBC 7.7  --  6.3 6.1 8.5  CREATININE 0.64  --  0.74  0.75 0.54  --   LATICACIDVEN  --  1.39  --   --   --     Estimated Creatinine Clearance: 38.6 mL/min (by C-G formula based on SCr of 0.8 mg/dL).    No Known Allergies  Antimicrobials this admission: Cefepime 8/17 >> Vanc 8/17 >>  Dose adjustments this admission: none  Microbiology results: 8/17 BCx: NGTD 8/18 BAL LLL: GNR, GPC in clusters and pairs, reinc for better growth 8/18 fungal cx: 8/18 AFB smear: 8/18 AFB culture:  Thank you for allowing pharmacy to be a part of this patient's care.  Romanita Fager L. Roseanne RenoStewart, PharmD Infectious Diseases Clinical Pharmacist Pager: [phone] 01/12/2016 12:35 PM

## 2016-01-12 NOTE — Progress Notes (Signed)
PROGRESS NOTE    Yehuda BuddMoihh Wendorff  [id number] DOB: 1945-10-27 DOA: 01/09/2016 PCP: Pete Glatterawn T Langeland, MD    Brief Narrative:  Patient is a 70 y.o. female with past medical history of diabetes, GERD, severe protein calorie malnutrition presented to the ED on 8/17 with cough, shortness of breath and left-sided chest pain. CT chest on admission showed multiple lung abscesses. She was admitted and started on empiric antibiotics, pulmonology was consulted, bronchoscopy on 8/18.   Assessment & Plan:   Multiple Lung abscess (HCC) - On empiric Abx of Vanc and cefepime - Bronchoscopy 8/18, pending results - BAL with 390 WBC, cloudy, bacteria noted, neutrophil predominant - Follow cultures - On airborne precautions until TB testing comes back  - No fevers or leukocytosis - IVF with NS at 75cc/hr  Left sided chest pain - Atypical, 2/2 to above abscesses - Pain control with norco  Pulmonary nodule - Seen on CT, recommended follow up post treatment as above  Acute colitis, asymptomatic? - Seen on CT scan.  She reports no abdominal pain, diarrhea but imaging shows colonic thickening - She is on Zosyn which should treat intra-abdominal pathology  Mild asymptomatic hyponatremia - Resolved  Anemia, microcytic - Anemia panel 1 month ago was relatively normal and microcytosis was present - Target cells on previous smear - ? Thalassemia - Hgb 8.1 today, stable, but trending downwards - Continue to monitor - She is on daily iron supplementation - Consider repeat iron, ferritin if continues to trend down  DM2 - CBG ranging from 100ss-250s depending on if she eats.  - Continue lantus and SSI - Also on metformin at home.   HTN - BP relatively well controlled, on no meds - Monitor.  Consider amlodipine if needed  Severe protein calorie malnutrition - Continue supplements   DVT prophylaxis: Lovenox Code Status: Full Family Communication: Son at bedside Disposition Plan: Pending Bronch  culture results  Consultants:   PCCM  DM coordinator  Procedures:   Bronchoscopy 8/18  Antimicrobials:   Vancomycin 8/17 --> now  Cefepime 8/17 --> now    Subjective: Patient reports doing well through son.  She has left sided chest pain which is unchanged.  Denies diarrhea or abdominal symptoms.    Objective: Vitals:   01/11/16 1616 01/11/16 2011 01/12/16 0531 01/12/16 1019  BP: (!) 138/50 (!) 139/54 (!) 127/49 (!) 133/54  Pulse: 88 85 88 91  Resp: 18 18 18 17   Temp: 98.2 F (36.8 C) 99.8 F (37.7 C) 98.6 F (37 C) 98.6 F (37 C)  TempSrc: Oral Oral Oral Oral  SpO2: 96% 95% 92% 97%  Weight:  78 lb 14.8 oz (35.8 kg)    Height:        Intake/Output Summary (Last 24 hours) at 01/12/16 1507 Last data filed at 01/12/16 1222  Gross per 24 hour  Intake          1883.75 ml  Output             1900 ml  Net           -16.25 ml   Filed Weights   01/10/16 1522 01/10/16 2104 01/11/16 2011  Weight: 73 lb 10.1 oz (33.4 kg) 78 lb 0.7 oz (35.4 kg) 78 lb 14.8 oz (35.8 kg)    Examination:  General exam:  frail elderly woman Eyes: Anicteric sclerae, non injected conjunctivae Respiratory system: Breathing comfortably, no audible wheezing Cardiovascular system: S1 & S2 heard, RR, NR Gastrointestinal system: Abdomen is scaphoid, soft and  nontender. +BS Central nervous system: Alert and oriented.  Extremities: moving all extremities equally, no lower extremity edema noted Skin: No rashes, lesions or ulcers noted    Data Reviewed:  CBC:  Recent Labs Lab 01/09/16 0515 01/10/16 0322 01/11/16 0503 01/12/16 0520  WBC 7.7 6.3 6.1 8.5  HGB 9.7* 8.7* 8.7* 8.1*  HCT 29.0* 27.7* 27.6* 25.9*  MCV 63.2* 64.3* 64.8* 64.4*  PLT 169 141* 150 132*   Basic Metabolic Panel:  Recent Labs Lab 01/09/16 0515 01/10/16 0322 01/11/16 0503  NA 133* 133* 139  K 4.4 3.5 3.6  CL 97* 96* 105  CO2 27 28 26   GLUCOSE 136* 216* 64*  BUN 27* 14 10  CREATININE 0.64 0.74  0.75 0.54    CALCIUM 9.5 8.9 9.0  MG  --  1.5*  --   PHOS  --  4.1  --    GFR: Estimated Creatinine Clearance: 38.6 mL/min (by C-G formula based on SCr of 0.8 mg/dL). Liver Function Tests:  Recent Labs Lab 01/09/16 1830  ALBUMIN 3.2*   No results for input(s): LIPASE, AMYLASE in the last 168 hours. No results for input(s): AMMONIA in the last 168 hours. Coagulation Profile: No results for input(s): INR, PROTIME in the last 168 hours. Cardiac Enzymes:  Recent Labs Lab 01/09/16 0747 01/09/16 1205  TROPONINI <0.03 <0.03   BNP (last 3 results) No results for input(s): PROBNP in the last 8760 hours. HbA1C: No results for input(s): HGBA1C in the last 72 hours. CBG:  Recent Labs Lab 01/11/16 1226 01/11/16 1612 01/11/16 2010 01/12/16 0757 01/12/16 1234  GLUCAP 177* 250* 268* 115* 249*   Lipid Profile: No results for input(s): CHOL, HDL, LDLCALC, TRIG, CHOLHDL, LDLDIRECT in the last 72 hours. Thyroid Function Tests: No results for input(s): TSH, T4TOTAL, FREET4, T3FREE, THYROIDAB in the last 72 hours. Anemia Panel: No results for input(s): VITAMINB12, FOLATE, FERRITIN, TIBC, IRON, RETICCTPCT in the last 72 hours. Sepsis Labs:  Recent Labs Lab 01/09/16 1842  LATICACIDVEN 1.39    Recent Results (from the past 240 hour(s))  Blood culture (routine x 2)     Status: None (Preliminary result)   Collection Time: 01/09/16  1:45 PM  Result Value Ref Range Status   Specimen Description BLOOD RIGHT HAND  Final   Special Requests BOTTLES DRAWN AEROBIC AND ANAEROBIC 5CC  Final   Culture NO GROWTH 2 DAYS  Final   Report Status PENDING  Incomplete  Blood culture (routine x 2)     Status: None (Preliminary result)   Collection Time: 01/09/16  1:50 PM  Result Value Ref Range Status   Specimen Description BLOOD LEFT HAND  Final   Special Requests BOTTLES DRAWN AEROBIC AND ANAEROBIC 5CC  Final   Culture NO GROWTH 2 DAYS  Final   Report Status PENDING  Incomplete  Culture,  bal-quantitative     Status: None (Preliminary result)   Collection Time: 01/10/16  2:23 PM  Result Value Ref Range Status   Specimen Description BRONCHIAL ALVEOLAR LAVAGE  Final   Special Requests LLL  Final   Gram Stain   Final    ABUNDANT WBC PRESENT,BOTH PMN AND MONONUCLEAR RARE GRAM POSITIVE COCCI IN CLUSTERS IN PAIRS RARE GRAM NEGATIVE RODS    Culture CULTURE REINCUBATED FOR BETTER GROWTH  Final   Report Status PENDING  Incomplete         Radiology Studies: No results found.      Scheduled Meds: . ceFEPime (MAXIPIME) IV  1 g Intravenous  Q24H  . enoxaparin (LOVENOX) injection  20 mg Subcutaneous Q24H  . feeding supplement (GLUCERNA SHAKE)  237 mL Oral BID BM  . ferrous gluconate  324 mg Oral Q breakfast  . gabapentin  600 mg Oral TID  . insulin aspart  0-5 Units Subcutaneous QHS  . insulin aspart  0-9 Units Subcutaneous TID WC  . insulin glargine  20 Units Subcutaneous Q2200  . vancomycin  500 mg Intravenous Q24H  . Vitamin D (Ergocalciferol)  50,000 Units Oral Q Fri   Continuous Infusions: . sodium chloride 75 mL/hr at 01/11/16 2236  . sodium chloride 10 mL/hr at 01/10/16 1356     LOS: 3 days    Time spent: 25 minutes    Debe Coder, MD [facility] Pager [phone]  If 7PM-7AM, please contact night-coverage [email or web address] Password [email or web address] 01/12/2016, 3:07 PM

## 2016-01-12 NOTE — Progress Notes (Signed)
Temp of 101.3. MD made aware. One time order of tylenol given per MD. Will continue to monitor pt.

## 2016-01-13 DIAGNOSIS — J851 Abscess of lung with pneumonia: Secondary | ICD-10-CM | POA: Diagnosis not present

## 2016-01-13 DIAGNOSIS — R911 Solitary pulmonary nodule: Secondary | ICD-10-CM | POA: Diagnosis not present

## 2016-01-13 DIAGNOSIS — J850 Gangrene and necrosis of lung: Secondary | ICD-10-CM | POA: Diagnosis not present

## 2016-01-13 DIAGNOSIS — R0902 Hypoxemia: Secondary | ICD-10-CM

## 2016-01-13 DIAGNOSIS — R079 Chest pain, unspecified: Secondary | ICD-10-CM | POA: Diagnosis not present

## 2016-01-13 DIAGNOSIS — R938 Abnormal findings on diagnostic imaging of other specified body structures: Secondary | ICD-10-CM | POA: Diagnosis not present

## 2016-01-13 LAB — COMPREHENSIVE METABOLIC PANEL
ALBUMIN: 2.5 g/dL — AB (ref 3.5–5.0)
ALK PHOS: 54 U/L (ref 38–126)
ALT: 19 U/L (ref 14–54)
ANION GAP: 4 — AB (ref 5–15)
AST: 17 U/L (ref 15–41)
BILIRUBIN TOTAL: 0.7 mg/dL (ref 0.3–1.2)
BUN: 13 mg/dL (ref 6–20)
CALCIUM: 8.6 mg/dL — AB (ref 8.9–10.3)
CO2: 29 mmol/L (ref 22–32)
Chloride: 106 mmol/L (ref 101–111)
Creatinine, Ser: 0.49 mg/dL (ref 0.44–1.00)
GFR calc non Af Amer: >60 mL/min (ref >60)
GLUCOSE: 110 mg/dL — AB (ref 65–99)
Potassium: 3.6 mmol/L (ref 3.5–5.1)
Sodium: 139 mmol/L (ref 135–145)
TOTAL PROTEIN: 5.4 g/dL — AB (ref 6.5–8.1)

## 2016-01-13 LAB — CBC
HEMATOCRIT: 26.6 % — AB (ref 36.0–46.0)
HEMOGLOBIN: 8.2 g/dL — AB (ref 12.0–15.0)
MCH: 20 pg — AB (ref 26.0–34.0)
MCHC: 30.8 g/dL (ref 30.0–36.0)
MCV: 65 fL — ABNORMAL LOW (ref 78.0–100.0)
Platelets: 124 10*3/uL — ABNORMAL LOW (ref 150–400)
RBC: 4.09 MIL/uL (ref 3.87–5.11)
RDW: 16 % — ABNORMAL HIGH (ref 11.5–15.5)
WBC: 6.2 10*3/uL (ref 4.0–10.5)

## 2016-01-13 LAB — GLUCOSE, CAPILLARY
Glucose-Capillary: 117 mg/dL — ABNORMAL HIGH (ref 65–99)
Glucose-Capillary: 236 mg/dL — ABNORMAL HIGH (ref 65–99)
Glucose-Capillary: 290 mg/dL — ABNORMAL HIGH (ref 65–99)
Glucose-Capillary: 277 mg/dL — ABNORMAL HIGH (ref 65–99)

## 2016-01-13 LAB — CULTURE, BAL-QUANTITATIVE

## 2016-01-13 LAB — VANCOMYCIN, TROUGH: Vancomycin Tr: <4 ug/mL — ABNORMAL LOW (ref 15–20)

## 2016-01-13 LAB — ANTINUCLEAR ANTIBODIES, IFA: ANA Ab, IFA: NEGATIVE

## 2016-01-13 LAB — CULTURE, BAL-QUANTITATIVE W GRAM STAIN: Culture: >=100000 — AB

## 2016-01-13 MED ORDER — INSULIN GLARGINE 100 UNIT/ML ~~LOC~~ SOLN
30.0000 [IU] | Freq: Every day | SUBCUTANEOUS | Status: DC
  Administered 2016-01-13 – 2016-01-15 (×3): 30 [IU] via SUBCUTANEOUS
  Filled 2016-01-13 (×4): qty 0.3

## 2016-01-13 MED ORDER — VANCOMYCIN HCL 500 MG IV SOLR
500.0000 mg | Freq: Two times a day (BID) | INTRAVENOUS | Status: DC
  Administered 2016-01-14: 500 mg via INTRAVENOUS
  Filled 2016-01-13 (×2): qty 500

## 2016-01-13 NOTE — Progress Notes (Signed)
Pharmacy Antibiotic Note  Olivia BuddMoihh Williamson is a 70 y.o. female admitted on 01/09/2016 with multiple lung abscesses.  Pharmacy has been consulted for Vancomycin and Cefepime dosing. Pt with very low body weight (~36 kg).    Vanc trough level 8/21 is < 4 mg/ml at 1713.  Trough level was due at 1430, so ~2.5 hrs later than optimal.  But, do not expect that true trough level is at goal.  Plan:   Increase Vancomycin to 500 mg IV q12hrs.   Vanc trough goal is 15- 20 mcg/ml.   Repeat vanc trough level at steady state.   Continue cefepime 1 gm IV q24hrs.   F/u cultures, watch renal fxn, weight  Height: 5\' 2"  (157.5 cm) Weight: 79 lb 12.9 oz (36.2 kg) IBW/kg (Calculated) : 50.1  Temp (24hrs), Avg:98.6 F (37 C), Min:98.2 F (36.8 C), Max:99.2 F (37.3 C)   Recent Labs Lab 01/09/16 0515 01/09/16 1842 01/10/16 0322 01/11/16 0503 01/12/16 0520 01/13/16 0847 01/13/16 1713  WBC 7.7  --  6.3 6.1 8.5 6.2  --   CREATININE 0.64  --  0.74  0.75 0.54  --  0.49  --   LATICACIDVEN  --  1.39  --   --   --   --   --   VANCOTROUGH  --   --   --   --   --   --  <4*    Estimated Creatinine Clearance: 39 mL/min (by C-G formula based on SCr of 0.8 mg/dL).    No Known Allergies  Antimicrobials this admission: Cefepime 8/17 >> Vanc 8/17 >>  Dose adjustments this admission:   8/21 pm - Vanc 500 mg IV q24hrs -> increased to 500 mg IV q12hrs d/t low trough level  Microbiology results: 8/17 blood x 2 - no growth x 4 days to date 8/18 BAL LLL: GNR, GPC in clusters and pairs, reincubated  for better growth -> consistent with normal respiratory flora 8/18 fungal cx: 8/18 AFB smear: 8/18 AFB culture:  Olivia Williamson, RPh Pager: [phone] 01/13/2016 7:27 PM

## 2016-01-13 NOTE — Progress Notes (Addendum)
Inpatient Diabetes Program Recommendations  AACE/ADA: New Consensus Statement on Inpatient Glycemic Control (2015)  Target Ranges:  Prepandial:   less than 140 mg/dL      Peak postprandial:   less than 180 mg/dL (1-2 hours)      Critically ill patients:  140 - 180 mg/dL   Lab Results  Component Value Date   GLUCAP 236 (H) 01/13/2016   HGBA1C 12.2 (H) 12/15/2015    Review of Glycemic Control  Note fasting glucose levels are well controlled on 20 units lantus. However cbg's are high following meals. Please consider adding low dose novolog meal coverage of 3 units tidwc in addition to the sensitive correction tidwc.  Thank you Lenor CoffinAnn Jaloni Sorber, RN, MSN, CDE  Diabetes Inpatient Program Office: [phone] Pager: [phone] 8:00 am to 5:00 pm  Patient has been seen by Diabetes Coordinator during last hospital admission on 12/16/15.

## 2016-01-13 NOTE — Progress Notes (Signed)
PROGRESS NOTE    Olivia Williamson  RUE:[id number] DOB: [birth year] DOA: 01/09/2016 PCP: Pete Glatter, MD    Brief Narrative:   Patient is a 70 y.o. female with past medical history of diabetes, GERD, severe protein calorie malnutrition presented to the ED on 8/17 with cough, shortness of breath and left-sided chest pain. CT chest on admission showed multiple lung abscesses. She was admitted and started on empiric antibiotics, pulmonology was consulted, bronchoscopy on 8/18.   Assessment & Plan:   Multiple Lung abscess (HCC) - On empiric Abx of Vanc and cefepime,  Bronchoscopy 8/18, pending results - BAL with 390 WBC, cloudy, bacteria noted, neutrophil predominant - Follow cultures - On airborne precautions until TB testing comes back  - No fevers or leukocytosis  Left sided chest pain- Atypical, 2/2 to above abscesses,  Pain control with norco  Pulmonary nodule - Seen on CT, recommended follow up post treatment as above  Acute colitis, asymptomatic? - Seen on CT scan.  She reports no abdominal pain, diarrhea but imaging shows colonic thickening,  She is on Zosyn which should treat intra-abdominal pathology  Mild asymptomatic hyponatremia - Resolved  HTN - BP relatively well controlled, on no meds - Monitor.  Consider amlodipine if needed  Severe protein calorie malnutrition - Continue supplements  Anemia, microcytic - Anemia panel 1 month ago was relatively normal and microcytosis was present - Target cells on previous smear - ? Thalassemia - Hgb 8.1 today, stable, but trending downwards - Continue to monitor - She is on daily iron supplementation - Consider repeat iron, ferritin if continues to trend down  DM2 -  Continue lantus increased, continue SSI - Also on metformin at home.   CBG (last 3)   Recent Labs  01/12/16 2021 01/13/16 0853 01/13/16 1147  GLUCAP 338* 117* 236*     DVT prophylaxis: Lovenox Code Status: Full Family Communication: Son at  bedside Disposition Plan: Pending Bronch culture results  Consultants:    PCCM  DM coordinator  Procedures:   Bronchoscopy 8/18  Antimicrobials:   Vancomycin 8/17 -- > now  Cefepime 8/17 --> now    Subjective:  Patient reports doing well through son.  She has left sided chest pain which is unchanged. Denies diarrhea or abdominal symptoms.    Objective: Vitals:   01/12/16 1700 01/12/16 2021 01/13/16 0359 01/13/16 1019  BP: (!) 132/56 (!) 130/57 (!) 118/51 119/60  Pulse: 92 89 79 79  Resp: 20 19 19 18   Temp: (!) 101.3 F (38.5 C) 99.2 F (37.3 C) 98.2 F (36.8 C) 98.3 F (36.8 C)  TempSrc: Oral Oral Oral Oral  SpO2: 97% 98% 98% 98%  Weight:  36.2 kg (79 lb 12.9 oz)    Height:        Intake/Output Summary (Last 24 hours) at 01/13/16 1251 Last data filed at 01/13/16 0900  Gross per 24 hour  Intake             1200 ml  Output              350 ml  Net              850 ml   Filed Weights   01/10/16 2104 01/11/16 2011 01/12/16 2021  Weight: 35.4 kg (78 lb 0.7 oz) 35.8 kg (78 lb 14.8 oz) 36.2 kg (79 lb 12.9 oz)    Examination:  General exam:  frail elderly woman Eyes: Anicteric sclerae, non injected conjunctivae Respiratory system: Breathing comfortably, no audible  wheezing Cardiovascular system: S1 & S2 heard, RR, NR Gastrointestinal system: Abdomen is scaphoid, soft and nontender. +BS Central nervous system: Alert and oriented.  Extremities: moving all extremities equally, no lower extremity edema noted Skin: No rashes, lesions or ulcers noted    Data Reviewed:  CBC:  Recent Labs Lab 01/09/16 0515 01/10/16 0322 01/11/16 0503 01/12/16 0520 01/13/16 0847  WBC 7.7 6.3 6.1 8.5 6.2  HGB 9.7* 8.7* 8.7* 8.1* 8.2*  HCT 29.0* 27.7* 27.6* 25.9* 26.6*  MCV 63.2* 64.3* 64.8* 64.4* 65.0*  PLT 169 141* 150 132* 124*   Basic Metabolic Panel:  Recent Labs Lab 01/09/16 0515 01/10/16 0322 01/11/16 0503 01/13/16 0847  NA 133* 133* 139 139  K 4.4 3.5  3.6 3.6  CL 97* 96* 105 106  CO2 27 28 26 29   GLUCOSE 136* 216* 64* 110*  BUN 27* 14 10 13   CREATININE 0.64 0.74  0.75 0.54 0.49  CALCIUM 9.5 8.9 9.0 8.6*  MG  --  1.5*  --   --   PHOS  --  4.1  --   --    GFR: Estimated Creatinine Clearance: 39 mL/min (by C-G formula based on SCr of 0.8 mg/dL). Liver Function Tests:  Recent Labs Lab 01/09/16 1830 01/13/16 0847  AST  --  17  ALT  --  19  ALKPHOS  --  54  BILITOT  --  0.7  PROT  --  5.4*  ALBUMIN 3.2* 2.5*   No results for input(s): LIPASE, AMYLASE in the last 168 hours. No results for input(s): AMMONIA in the last 168 hours. Coagulation Profile: No results for input(s): INR, PROTIME in the last 168 hours. Cardiac Enzymes:  Recent Labs Lab 01/09/16 0747 01/09/16 1205  TROPONINI <0.03 <0.03   BNP (last 3 results) No results for input(s): PROBNP in the last 8760 hours. HbA1C: No results for input(s): HGBA1C in the last 72 hours. CBG:  Recent Labs Lab 01/12/16 1234 01/12/16 1736 01/12/16 2021 01/13/16 0853 01/13/16 1147  GLUCAP 249* 319* 338* 117* 236*   Lipid Profile: No results for input(s): CHOL, HDL, LDLCALC, TRIG, CHOLHDL, LDLDIRECT in the last 72 hours. Thyroid Function Tests: No results for input(s): TSH, T4TOTAL, FREET4, T3FREE, THYROIDAB in the last 72 hours. Anemia Panel: No results for input(s): VITAMINB12, FOLATE, FERRITIN, TIBC, IRON, RETICCTPCT in the last 72 hours. Sepsis Labs:  Recent Labs Lab 01/09/16 1842  LATICACIDVEN 1.39    Recent Results (from the past 240 hour(s))  Blood culture (routine x 2)     Status: None (Preliminary result)   Collection Time: 01/09/16  1:45 PM  Result Value Ref Range Status   Specimen Description BLOOD RIGHT HAND  Final   Special Requests BOTTLES DRAWN AEROBIC AND ANAEROBIC 5CC  Final   Culture NO GROWTH 3 DAYS  Final   Report Status PENDING  Incomplete  Blood culture (routine x 2)     Status: None (Preliminary result)   Collection Time: 01/09/16   1:50 PM  Result Value Ref Range Status   Specimen Description BLOOD LEFT HAND  Final   Special Requests BOTTLES DRAWN AEROBIC AND ANAEROBIC 5CC  Final   Culture NO GROWTH 3 DAYS  Final   Report Status PENDING  Incomplete  Culture, bal-quantitative     Status: None (Preliminary result)   Collection Time: 01/10/16  2:23 PM  Result Value Ref Range Status   Specimen Description BRONCHIAL ALVEOLAR LAVAGE  Final   Special Requests LLL  Final   Gram  Stain   Final    ABUNDANT WBC PRESENT,BOTH PMN AND MONONUCLEAR RARE GRAM POSITIVE COCCI IN CLUSTERS IN PAIRS RARE GRAM NEGATIVE RODS    Culture CULTURE REINCUBATED FOR BETTER GROWTH  Final   Report Status PENDING  Incomplete     Radiology Studies: No results found.   Scheduled Meds: . ceFEPime (MAXIPIME) IV  1 g Intravenous Q24H  . enoxaparin (LOVENOX) injection  20 mg Subcutaneous Q24H  . feeding supplement (GLUCERNA SHAKE)  237 mL Oral BID BM  . ferrous gluconate  324 mg Oral Q breakfast  . gabapentin  600 mg Oral TID  . insulin aspart  0-5 Units Subcutaneous QHS  . insulin aspart  0-9 Units Subcutaneous TID WC  . insulin glargine  20 Units Subcutaneous Q2200  . vancomycin  500 mg Intravenous Q24H  . Vitamin D (Ergocalciferol)  50,000 Units Oral Q Fri   Continuous Infusions: . sodium chloride 75 mL/hr at 01/13/16 0657  . sodium chloride 10 mL/hr at 01/10/16 1356   Anti-infectives    Start     Dose/Rate Route Frequency Ordered Stop   01/10/16 1500  vancomycin (VANCOCIN) 500 mg in sodium chloride 0.9 % 100 mL IVPB     500 mg 66.7 mL/hr over 90 Minutes Intravenous Every 24 hours 01/09/16 1755     01/10/16 1500  ceFEPIme (MAXIPIME) 1 g in dextrose 5 % 50 mL IVPB  Status:  Discontinued     1 g 100 mL/hr over 30 Minutes Intravenous Every 24 hours 01/09/16 1755 01/10/16 1552   01/09/16 1400  ceFEPIme (MAXIPIME) 1 g in dextrose 5 % 50 mL IVPB     1 g 100 mL/hr over 30 Minutes Intravenous Every 24 hours 01/09/16 1358     01/09/16 1345   vancomycin (VANCOCIN) IVPB 1000 mg/200 mL premix     1,000 mg 200 mL/hr over 60 Minutes Intravenous  Once 01/09/16 1341 01/09/16 1731      LOS: 4 days    Time spent: 25 minutes  Signature  SINGH,PRASHANT K M.D on 01/13/2016 at 12:51 PM  Between 7am to 7pm - Pager - [phone], After 7pm go to [email or web address] - password [facility]  [facility]  - Office  [phone]

## 2016-01-13 NOTE — Progress Notes (Signed)
Occupational Therapy Evaluation Patient Details Name: Olivia Williamson MRN: [id number] DOB: [birth year] Today's Date: 01/13/2016    History of Present Illness 70 yo female with onset of weight loss and weakness was admitted, has lung abscesses and history of DM, neuropathy, HTN and possible memory changes   Clinical Impression    PTA, pt lived with her son and [facility] to [facility] pain. Pt was mod I with ADL. Pt fatigues easily and requries overall S and set up for ADL. Recommend pt have initial 24/7 S after D/C. Will follow acutely to facilitate safe D/C home.     Follow Up Recommendations  No OT follow up;Supervision/Assistance - 24 hour (initially)    Equipment Recommendations  Tub/shower seat    Recommendations for Other Services       Precautions / Restrictions Precautions Precautions: Fall      Mobility Bed Mobility               General bed mobility comments: OOB in chair  Transfers Overall transfer level: Needs assistance Equipment used: 1 person hand held assist   Sit to Stand: Min guard Stand pivot transfers: Supervision       General transfer comment: pt furniture walking    Balance Overall balance assessment: Needs assistance           Standing balance-Leahy Scale: Fair                              ADL Overall ADL's : Needs assistance/impaired     Grooming: Set up   Upper Body Bathing: Set up   Lower Body Bathing: Set up;Supervison/ safety   Upper Body Dressing : Set up;Sitting   Lower Body Dressing: Set up;Supervision/safety;Sit to/from stand   Toilet Transfer: LawyerMin guard   Toileting- Clothing Manipulation and Hygiene: Supervision/safety       Functional mobility during ADLs: Min guard General ADL Comments: Fatigues easily  Will benef it from use of shower chair. Will assess to see if pt could use 3 in1 for shower chair due to her size. Family needs to be educated on use of 3in1 for shower chair and  need for S with mobility.      Vision     Perception     Praxis      Pertinent Vitals/Pain Pain Assessment: Faces Faces Pain Scale: Hurts a little bit Pain Location: L chest and BLE Pain Descriptors / Indicators: Discomfort;Grimacing Pain Intervention(s): Limited activity within patient's tolerance     Hand Dominance     Extremity/Trunk Assessment             Communication     Cognition Arousal/Alertness: Awake/alert Behavior During Therapy: Flat affect Overall Cognitive Status: Difficult to assess                     General Comments       Exercises       Shoulder Instructions      Home Living Family/patient expects to be discharged to:: Private residence Living Arrangements: Children Available Help at Discharge: Family;Available 24 hours/day Type of Home: House Home Access: Stairs to enter Entergy CorporationEntrance Stairs-Number of Steps: 4 Entrance Stairs-Rails: None Home Layout: One level     Bathroom Shower/Tub: Chief Strategy OfficerTub/shower unit   Bathroom Toilet: Standard Bathroom Accessibility: Yes How Accessible: Accessible via walker Home Equipment: Cane - single point          Prior  Functioning/Environment Level of Independence: Needs assistance  Gait / Transfers Assistance Needed: pain with gait and using SPC ADL's / Homemaking Assistance Needed: Sons are home to assist pt's care Communication / Swallowing Assistance Needed:  (son interpreted)      OT Diagnosis: Generalized weakness;Acute pain   OT Problem List: Decreased strength;Decreased activity tolerance;Decreased knowledge of use of DME or AE;Pain   OT Treatment/Interventions: Self-care/ADL training;Energy conservation;DME and/or AE instruction;Therapeutic activities;Patient/family education    OT Goals(Current goals can be found in the care plan section) Acute Rehab OT Goals Patient Stated Goal: per son to go home OT Goal Formulation: With patient Time For Goal Achievement: 01/27/16 Potential to  Achieve Goals: Good ADL Goals Pt Will Perform Tub/Shower Transfer: Tub transfer;with supervision;ambulating;shower seat;with caregiver independent in assisting Additional ADL Goal #1: Family will verbalize 3 strategies to reduce risk of falls at home  OT Frequency: Min 2X/week   Barriers to D/C:            Co-evaluation              End of Session Nurse Communication: Mobility status  Activity Tolerance: Patient tolerated treatment well Patient left: in chair;with call bell/phone within reach;with family/visitor present   Time: 1440-1453 OT Time Calculation (min): 13 min Charges:  OT General Charges $OT Visit: 1 Procedure OT Evaluation $OT Eval Moderate Complexity: 1 Procedure G-Codes:    Olivia Williamson,Olivia Williamson 01/13/2016, 3:02 PM   [facility]iff[facility] Williamson, OTR/L  [phone] 01/13/2016

## 2016-01-13 NOTE — Progress Notes (Signed)
Name: Olivia Williamson MRN: [id number] DOB: [birth year]    ADMISSION DATE:  01/09/2016 CONSULTATION DATE:  8/17  REFERRING MD :  Dartha Lodgegbata ([facility])   CHIEF COMPLAINT:  Abnormal chest CT  BRIEF PATIENT DESCRIPTION:  70yo non english speaking female been in [place], with hx uncontrolled DM, GERD, HTN who presented 8/17 with chest pain, abd pain, weight loss and generalized weakness.  W/u revealed multiple cavitary lung nodules and PCCM consulted.   SIGNIFICANT EVENTS   Bronch 8/18: AFB >>> Fungal:   BAL: abundant wbc, rare gpc clusters in pairs, rare GNR Cell count: wbc: 390; neutrophil 80; other cells: bacteria noted. Cytology>>>  STUDIES:  CT chest/abd/pelvis 8/17>>> 1. Superior segment left lower lobe necrotizing pneumonia with small lung abscess (series 2, image 26). No associated left pleural effusion or thoracic lymphadenopathy.  2. Also at the site of a right lower lobe consolidation on the 2015 comparison there is a new 15 mm cavitary lesion with a small fluid level suggesting abscess, as well as surrounding tree-in-bud nodularity indicating acute infection. 3. Superimposed 16 mm solid right lower lobe lung nodule. Recommend post treatment chest CT follow-up of this finding. 4. New diffuse large bowel wall thickening in the abdomen in keeping with acute colitis. No abdominal free abscess or complicating features identified. 5. Questionable infectious involvement of proximal small bowel loops. No evidence of obstruction.     SUBJECTIVE:  Feels better (her son is at bedside and interpreting)  VITAL SIGNS: Temp:  [98.2 F (36.8 C)-101.3 F (38.5 C)] 98.2 F (36.8 C) (08/21 0359) Pulse Rate:  [79-92] 79 (08/21 0359) Resp:  [17-20] 19 (08/21 0359) BP: (118-133)/(51-57) 118/51 (08/21 0359) SpO2:  [97 %-98 %] 98 % (08/21 0359) Weight:  [79 lb 12.9 oz (36.2 kg)] 79 lb 12.9 oz (36.2 kg) (08/20 2021) Room air   PHYSICAL EXAMINATION: General:  Small, frail, cachectic female,  NAD  Neuro:  Awake, alert, appropriate, MAE  HEENT:  Mm moist, no JVD  Cardiovascular:  s1s2 rrr Lungs:  resps even non labored, crackles anteriorly on left.  Abdomen:  Soft, non tender, +bs  Musculoskeletal:  Warm and dry, no edema    Recent Labs Lab 01/09/16 0515 01/10/16 0322 01/11/16 0503  NA 133* 133* 139  K 4.4 3.5 3.6  CL 97* 96* 105  CO2 27 28 26   BUN 27* 14 10  CREATININE 0.64 0.74  0.75 0.54  GLUCOSE 136* 216* 64*    Recent Labs Lab 01/10/16 0322 01/11/16 0503 01/12/16 0520  HGB 8.7* 8.7* 8.1*  HCT 27.7* 27.6* 25.9*  WBC 6.3 6.1 8.5  PLT 141* 150 132*   No results found.  ASSESSMENT / PLAN:  Necrotizing PNA w/ Multiple cavitary lung nodules  Poor dentition -> s/p bronch 8/18. Afb, cultures and cytology still pending. She does have poor dentition & does have intermittent mouth pain so wonder if this may be coming from her mouth.   PLAN -  Continue broad spectrum abx  F/u pending afb, cultures and cytology... If afb and cytology negative would first get orthopantogram to better assess mouth & recemmend dental extraction if indicated; then would treat as necrotizing PNA w/ prolonged abx treatment until radiographic resolution--> likely 6-8 weeks  DM  Hyponatremia  PLAN -  Per primary    Simonne MartinetPeter E Babcock ACNP-BC [facility] Pager # [phone] OR # [phone] if no answer  Attending Note:  70 year old vietnamese woman presenting with necrotizing PNA and lung nodules.  On exam, lungs are clear bilaterally.  I reviewed chest CT myself, cavitations noted.  Discussed with PCCM-NP.  PNA: likely necrotizing.  - F/u on AFB.  - F/u on cultures.  - Continue broad spectrum abx for now.  Lung nodules:  - F/u CT in 6-8 wks after infection has been addressed to determine if biopsy is necessary.  - F/u on cytology.  Hypoxemia:  - Titrate O2 for sat of 88-92%.  - May need an ambulatory desat study prior to discharge.  Patient seen and  examined, agree with above note.  I dictated the care and orders written for this patient under my direction.  Alyson ReedyWesam G Yacoub, MD [phone]

## 2016-01-14 ENCOUNTER — Other Ambulatory Visit: Payer: Self-pay | Admitting: Acute Care

## 2016-01-14 DIAGNOSIS — J189 Pneumonia, unspecified organism: Secondary | ICD-10-CM

## 2016-01-14 DIAGNOSIS — J690 Pneumonitis due to inhalation of food and vomit: Secondary | ICD-10-CM

## 2016-01-14 DIAGNOSIS — R079 Chest pain, unspecified: Secondary | ICD-10-CM | POA: Diagnosis not present

## 2016-01-14 DIAGNOSIS — R938 Abnormal findings on diagnostic imaging of other specified body structures: Secondary | ICD-10-CM | POA: Diagnosis not present

## 2016-01-14 DIAGNOSIS — J850 Gangrene and necrosis of lung: Secondary | ICD-10-CM | POA: Diagnosis not present

## 2016-01-14 DIAGNOSIS — J984 Other disorders of lung: Secondary | ICD-10-CM

## 2016-01-14 DIAGNOSIS — J852 Abscess of lung without pneumonia: Secondary | ICD-10-CM

## 2016-01-14 DIAGNOSIS — J851 Abscess of lung with pneumonia: Secondary | ICD-10-CM | POA: Diagnosis not present

## 2016-01-14 LAB — CULTURE, BLOOD (ROUTINE X 2)
CULTURE: NO GROWTH
CULTURE: NO GROWTH

## 2016-01-14 LAB — ACID FAST SMEAR (AFB): ACID FAST SMEAR - AFSCU2: NEGATIVE

## 2016-01-14 LAB — GLUCOSE, CAPILLARY
Glucose-Capillary: 102 mg/dL — ABNORMAL HIGH (ref 65–99)
Glucose-Capillary: 221 mg/dL — ABNORMAL HIGH (ref 65–99)
Glucose-Capillary: 301 mg/dL — ABNORMAL HIGH (ref 65–99)
Glucose-Capillary: 265 mg/dL — ABNORMAL HIGH (ref 65–99)

## 2016-01-14 MED ORDER — AMOXICILLIN-POT CLAVULANATE 875-125 MG PO TABS
1.0000 | ORAL_TABLET | Freq: Two times a day (BID) | ORAL | Status: DC
  Administered 2016-01-14 – 2016-01-16 (×5): 1 via ORAL
  Filled 2016-01-14 (×5): qty 1

## 2016-01-14 NOTE — Progress Notes (Addendum)
Name: Yehuda BuddMoihh Faraj MRN: [id number] DOB: 1945/09/02    ADMISSION DATE:  01/09/2016 CONSULTATION DATE:  8/17  REFERRING MD :  Dartha Lodgegbata ([facility])   CHIEF COMPLAINT:  Abnormal chest CT  BRIEF PATIENT DESCRIPTION:  70yo non english speaking female been [place], with hx uncontrolled DM, GERD, HTN who presented 8/17 with chest pain, abd pain, weight loss and generalized weakness.  W/u revealed multiple cavitary lung nodules and PCCM consulted.   SIGNIFICANT EVENTS   Bronch 8/18: AFB >>> Fungal:   BAL: abundant wbc, rare gpc clusters in pairs, rare GNR Cell count: wbc: 390; neutrophil 80; other cells: bacteria noted. Cytology>>>negative   STUDIES:  CT chest/abd/pelvis 8/17>>> 1. Superior segment left lower lobe necrotizing pneumonia with small lung abscess (series 2, image 26). No associated left pleural effusion or thoracic lymphadenopathy.  2. Also at the site of a right lower lobe consolidation on the 2015 comparison there is a new 15 mm cavitary lesion with a small fluid level suggesting abscess, as well as surrounding tree-in-bud nodularity indicating acute infection. 3. Superimposed 16 mm solid right lower lobe lung nodule. Recommend post treatment chest CT follow-up of this finding. 4. New diffuse large bowel wall thickening in the abdomen in keeping with acute colitis. No abdominal free abscess or complicating features identified. 5. Questionable infectious involvement of proximal small bowel loops. No evidence of obstruction.     SUBJECTIVE:  Feels better (her son is at bedside and interpreting)  VITAL SIGNS: Temp:  [98.1 F (36.7 C)-98.5 F (36.9 C)] 98.1 F (36.7 C) (08/22 0846) Pulse Rate:  [79-85] 80 (08/22 0846) Resp:  [17-20] 17 (08/22 0846) BP: (130-138)/(50-63) 136/63 (08/22 0846) SpO2:  [99 %-100 %] 100 % (08/22 0846) Room air   PHYSICAL EXAMINATION: General:  Small, frail, cachectic female, NAD, up in chair  Neuro:  Awake, alert, appropriate, MAE    HEENT:  Mm moist, no JVD  Cardiovascular:  s1s2 rrr Lungs:  resps even non labored, crackles posteriorly   Abdomen:  Soft, non tender, +bs  Musculoskeletal:  Warm and dry, no edema    Recent Labs Lab 01/10/16 0322 01/11/16 0503 01/13/16 0847  NA 133* 139 139  K 3.5 3.6 3.6  CL 96* 105 106  CO2 28 26 29   BUN 14 10 13   CREATININE 0.74  0.75 0.54 0.49  GLUCOSE 216* 64* 110*    Recent Labs Lab 01/11/16 0503 01/12/16 0520 01/13/16 0847  HGB 8.7* 8.1* 8.2*  HCT 27.6* 25.9* 26.6*  WBC 6.1 8.5 6.2  PLT 150 132* 124*   No results found.  ASSESSMENT / PLAN:  Necrotizing PNA w/ Multiple cavitary lung nodules  Poor dentition -> s/p bronch 8/18. Afb, cultures and cytology still pending. She does have poor dentition & does have intermittent mouth pain so wonder if this may be coming from her mouth.  ->>cytology is negative  ->> cultures not diagnostic  PLAN -  Change to augmentin w/ plan to treat as necrotizing PNA w/ prolonged abx treatment until radiographic resolution--> likely 6-8 weeks f/u CT scan (ordered for [facility] at [address]) w/ appointment w/ Delton [facility]ombes[facility]rum on Oct 12 345pm.  F/u pending afb ... If afb negative would first get orthopantogram to better assess mouth & recemmend dental extraction if indicated    DM  Hyponatremia  PLAN -  Per primary    Simonne MartinetPeter E Babcock ACNP-BC [facility]Critical [facility] Pager # [phone] OR # [phone] if no answer  Attending Note:  70  year old vietnamese woman presenting with necrotizing PNA and lung nodules.  On exam, lungs are clear bilaterally.  I reviewed chest CT myself, cavitations noted.  Discussed with PCCM-NP.  PNA: likely necrotizing.                       - F/u on AFB, if positive the consult ID please.            - Augmentin PO until cavities close                        - F/u on cultures.                       - Continue broad spectrum abx for now.  Lung nodules:                       - F/u CT in 6-8 wks  after infection has been addressed to determine if biopsy is necessary.                       - F/u on cytology.  Hypoxemia:                       - D/C O2.  Poor dentition:  - Needs x-rays and likely referral to a dentist as that can be the source of infection.  PCCM will sign off, please call back if needed.  Patient seen and examined, agree with above note.  I dictated the care and orders written for this patient under my direction.  Alyson ReedyWesam G Kirsty Monjaraz, MD [phone]

## 2016-01-14 NOTE — Progress Notes (Signed)
F/u CT chest ordered to be done prior to her f/u w/ Byrum which is scheduled for Oct 12.   Olivia MartinetPeter E Fayette Gasner ACNP-BC [facility]Critical [facility] Pager # [phone] OR # [phone] if no answer

## 2016-01-14 NOTE — Progress Notes (Signed)
Physical Therapy Treatment Patient Details Name: Olivia Williamson MRN: [id number] DOB: [birth year] Today's Date: 01/14/2016    History of Present Illness [age] yo female with onset of weight loss and weakness was admitted, has lung abscesses and history of DM, neuropathy, HTN and possible memory changes    PT Comments    Pt is up to walk with PT and uses furniture to touch for security on the trip in her room. Due to airborne precautions and her generally weak presentation have remained in her room but may mask her on next visit to increase distance and encourage greater effort to increase endurance.  Continue acutely for progressing there ex and gait as tolerated by pt with her current conditions.  Follow Up Recommendations  Home health PT;Supervision for mobility/OOB     Equipment Recommendations  Rolling walker with 5" wheels    Recommendations for Other Services Rehab consult     Precautions / Restrictions Precautions Precautions: Fall Restrictions Weight Bearing Restrictions: No    Mobility  Bed Mobility                  Transfers Overall transfer level: Needs assistance Equipment used: 1 person hand held assist Transfers: Sit to/from Stand;Stand Pivot Transfers Sit to Stand: Min guard Stand pivot transfers: Min guard       General transfer comment: touches furniture and PT holding onto her UE  Ambulation/Gait Ambulation/Gait assistance: Min guard;Min assist Ambulation Distance (Feet): 35 Feet Assistive device: 1 person hand held assist Gait Pattern/deviations: Step-to pattern;Step-through pattern;Decreased stride length;Narrow base of support;Trunk flexed Gait velocity: reduced Gait velocity interpretation: Below normal speed for age/gender General Gait Details: pt uses furniture for steadying   Stairs            Wheelchair Mobility    Modified Rankin (Stroke Patients Only)       Balance Overall balance assessment: Needs  assistance Sitting-balance support: Feet supported Sitting balance-Leahy Scale: Good     Standing balance support: Bilateral upper extremity supported Standing balance-Leahy Scale: Fair                      Cognition Arousal/Alertness: Awake/alert Behavior During Therapy: Flat affect Overall Cognitive Status: Difficult to assess                      Exercises General Exercises - Lower Extremity Ankle Circles/Pumps: AROM;Both;20 reps Long Arc Quad: AROM;Strengthening;Both;10 reps Heel Slides: AROM;Strengthening;Both;10 reps Hip ABduction/ADduction: AROM;AAROM;Both;10 reps    General Comments        Pertinent Vitals/Pain Pain Assessment: Faces Faces Pain Scale: Hurts little more Pain Location: L foot on arch Pain Descriptors / Indicators: Tightness;Aching Pain Intervention(s): Limited activity within patient's tolerance;Monitored during session;Repositioned    Home Living                      Prior Function            PT Goals (current goals can now be found in the care plan section) Acute Rehab PT Goals Patient Stated Goal: home tomorrow likely but not guaranteed PT Goal Formulation: With patient/family Progress towards PT goals: Progressing toward goals    Frequency  Min 3X/week    PT Plan Current plan remains appropriate    Co-evaluation             End of Session   Activity Tolerance: Patient limited by pain;Patient limited by fatigue Patient left: in chair;with call bell/phone within  reach;with family/visitor present     Time: 1410-1438 PT Time Calculation (min) (ACUTE ONLY): 28 min  Charges:  $Gait Training: 8-22 mins $Therapeutic Exercise: 8-22 mins                    G Codes:      Ivar DrapeStout, Foday Cone E 01/14/2016, 3:01 PM    Samul Dadauth Cyd Hostler, PT MS Acute Rehab Dept. Number: [facility] [phone] and [facility] [phone]

## 2016-01-14 NOTE — Progress Notes (Signed)
Nutrition Follow-up  DOCUMENTATION CODES:   Severe malnutrition in context of chronic illness, Underweight  INTERVENTION:  Continue Glucerna Shake po BID, each supplement provides 220 kcal and 10 grams of protein.  Encourage adequate PO intake.   NUTRITION DIAGNOSIS:   Increased nutrient needs related to acute illness as evidenced by estimated needs; ongoing  GOAL:   Patient will meet greater than or equal to 90% of their needs; met  MONITOR:   PO intake, Supplement acceptance, Labs, Weight trends, Skin, I & O's  REASON FOR ASSESSMENT:   Malnutrition Screening Tool    ASSESSMENT:   70 y.o. female with past medical history of diabetes, GERD, severe protein calorie malnutrition presented to the ED on 8/17 with cough, shortness of breath and left-sided chest pain.  CT chest done on presentation revealed multiple lung abscesses..  Meal completion has been 70-100%. Son at bedside. He reports pt has been eating well currently and PTA with usual consumption of at least 3 meals a day with no other difficulties. Weight has been stable per weight records. Pt currently has Glucerna shake ordered and has been consuming them. RD to continue with current orders. Pt encouraged to eat her food at meals and to drink her supplements.   Nutrition-Focused physical exam completed. Findings are severe fat depletion, severe muscle depletion, and no edema.   Labs and medications reviewed.   Diet Order:  Diet Carb Modified Fluid consistency: Thin; Room service appropriate? Yes  Skin:  Reviewed, no issues  Last BM:  8/21  Height:   Ht Readings from Last 1 Encounters:  01/09/16 5' 2"  (1.575 m)    Weight:   Wt Readings from Last 1 Encounters:  01/12/16 79 lb 12.9 oz (36.2 kg)    Ideal Body Weight:  50 kg  BMI:  Body mass index is 14.6 kg/m.  Estimated Nutritional Needs:   Kcal:  1250-1450  Protein:  55-65 grams  Fluid:  >/= 1.5 L/day  EDUCATION NEEDS:   No education needs  identified at this time  Corrin Parker, MS, RD, LDN Pager # [phone] After hours/ weekend pager # [phone]

## 2016-01-14 NOTE — Progress Notes (Signed)
PROGRESS NOTE    Chasitee Zenker  YNW:[id number] DOB: Jun 05, 1945 DOA: 01/09/2016 PCP: Pete Glatter, MD    Brief Narrative:   Patient is a 70 y.o. female with past medical history of diabetes, GERD, severe protein calorie malnutrition presented to the ED on 8/17 with cough, shortness of breath and left-sided chest pain. CT chest on admission showed multiple lung abscesses. She was admitted and started on empiric antibiotics, pulmonology was consulted, bronchoscopy on 8/18.   Assessment & Plan:   Multiple Lung abscess (HCC) - On empiric IV antibiotics which were vancomycin and cefepime transitioned to Augmentin or pulmonary on 01/14/2016,  Bronchoscopy 8/18, pending results,  BAL with 390 WBC, cloudy, bacteria noted, neutrophil predominant, presents for a pierced a normal respiratory flora, continue airborne precautions still AFP negative, no fevers or leukocytosis, dental jaw x-rays also ordered to rule out source for metastatic infectious lesions in the lungs from poor dentition.  Left sided chest pain- Atypical, 2/2 to above abscesses,  Pain control with norco  Pulmonary nodule - Seen on CT, recommended follow up post treatment as above  Acute colitis, asymptomatic? - Seen on CT scan.  She reports no abdominal pain, diarrhea but imaging shows colonic thickening,  antibiotics for cavitary lung lesions should suffice. Clinically stable and symptom-free from the standpoint.   Mild asymptomatic hyponatremia - Resolved  HTN - stable off meds.   Severe protein calorie malnutrition - Continue supplements  Anemia, microcytic  Anemia panel 1 month ago was relatively normal and microcytosis was present, Target cells on previous smear,  ? Thalassemia - trend hemoglobin had an outpatient workup as needed.    DM2 -  Continue lantus increased, continue SSI - Also on metformin at home.   CBG (last 3)   Recent Labs  01/13/16 1732 01/13/16 2101 01/14/16 0845  GLUCAP 290* 277* 102*     DVT  prophylaxis: Lovenox Code Status: Full Family Communication: Son at bedside Disposition Plan: Pending Bronch culture results  Consultants:    PCCM  DM coordinator  Procedures:   Bronchoscopy 8/18  Antimicrobials:   Vancomycin 8/17 -- > now  Cefepime 8/17 --> now    Subjective:  Patient reports doing well through son. She denies any fever or chills, minimal cough which is now nonproductive, denies any chest pain or shortness of breath.    Objective: Vitals:   01/13/16 1733 01/13/16 2256 01/14/16 0614 01/14/16 0846  BP: (!) 136/53 (!) 130/50 (!) 138/52 136/63  Pulse: 85 79 82 80  Resp: 20 18 18 17   Temp: 98.5 F (36.9 C) 98.2 F (36.8 C) 98.2 F (36.8 C) 98.1 F (36.7 C)  TempSrc: Oral Oral Oral Oral  SpO2: 99% 100% 100% 100%  Weight:      Height:        Intake/Output Summary (Last 24 hours) at 01/14/16 1131 Last data filed at 01/14/16 0846  Gross per 24 hour  Intake              540 ml  Output                0 ml  Net              540 ml   Filed Weights   01/10/16 2104 01/11/16 2011 01/12/16 2021  Weight: 35.4 kg (78 lb 0.7 oz) 35.8 kg (78 lb 14.8 oz) 36.2 kg (79 lb 12.9 oz)    Examination:  General exam:  frail elderly woman Eyes: Anicteric sclerae, non injected  conjunctivae Respiratory system: Breathing comfortably, no audible wheezing Cardiovascular system: S1 & S2 heard, RR, NR Gastrointestinal system: Abdomen is scaphoid, soft and nontender. +BS Central nervous system: Alert and oriented.  Extremities: moving all extremities equally, no lower extremity edema noted Skin: No rashes, lesions or ulcers noted    Data Reviewed:  CBC:  Recent Labs Lab 01/09/16 0515 01/10/16 0322 01/11/16 0503 01/12/16 0520 01/13/16 0847  WBC 7.7 6.3 6.1 8.5 6.2  HGB 9.7* 8.7* 8.7* 8.1* 8.2*  HCT 29.0* 27.7* 27.6* 25.9* 26.6*  MCV 63.2* 64.3* 64.8* 64.4* 65.0*  PLT 169 141* 150 132* 124*   Basic Metabolic Panel:  Recent Labs Lab 01/09/16 0515  01/10/16 0322 01/11/16 0503 01/13/16 0847  NA 133* 133* 139 139  K 4.4 3.5 3.6 3.6  CL 97* 96* 105 106  CO2 27 28 26 29   GLUCOSE 136* 216* 64* 110*  BUN 27* 14 10 13   CREATININE 0.64 0.74  0.75 0.54 0.49  CALCIUM 9.5 8.9 9.0 8.6*  MG  --  1.5*  --   --   PHOS  --  4.1  --   --    GFR: Estimated Creatinine Clearance: 39 mL/min (by C-G formula based on SCr of 0.8 mg/dL). Liver Function Tests:  Recent Labs Lab 01/09/16 1830 01/13/16 0847  AST  --  17  ALT  --  19  ALKPHOS  --  54  BILITOT  --  0.7  PROT  --  5.4*  ALBUMIN 3.2* 2.5*   No results for input(s): LIPASE, AMYLASE in the last 168 hours. No results for input(s): AMMONIA in the last 168 hours. Coagulation Profile: No results for input(s): INR, PROTIME in the last 168 hours. Cardiac Enzymes:  Recent Labs Lab 01/09/16 0747 01/09/16 1205  TROPONINI <0.03 <0.03   BNP (last 3 results) No results for input(s): PROBNP in the last 8760 hours. HbA1C: No results for input(s): HGBA1C in the last 72 hours. CBG:  Recent Labs Lab 01/13/16 0853 01/13/16 1147 01/13/16 1732 01/13/16 2101 01/14/16 0845  GLUCAP 117* 236* 290* 277* 102*   Lipid Profile: No results for input(s): CHOL, HDL, LDLCALC, TRIG, CHOLHDL, LDLDIRECT in the last 72 hours. Thyroid Function Tests: No results for input(s): TSH, T4TOTAL, FREET4, T3FREE, THYROIDAB in the last 72 hours. Anemia Panel: No results for input(s): VITAMINB12, FOLATE, FERRITIN, TIBC, IRON, RETICCTPCT in the last 72 hours. Sepsis Labs:  Recent Labs Lab 01/09/16 1842  LATICACIDVEN 1.39    Recent Results (from the past 240 hour(s))  Blood culture (routine x 2)     Status: None (Preliminary result)   Collection Time: 01/09/16  1:45 PM  Result Value Ref Range Status   Specimen Description BLOOD RIGHT HAND  Final   Special Requests BOTTLES DRAWN AEROBIC AND ANAEROBIC 5CC  Final   Culture NO GROWTH 4 DAYS  Final   Report Status PENDING  Incomplete  Blood culture  (routine x 2)     Status: None (Preliminary result)   Collection Time: 01/09/16  1:50 PM  Result Value Ref Range Status   Specimen Description BLOOD LEFT HAND  Final   Special Requests BOTTLES DRAWN AEROBIC AND ANAEROBIC 5CC  Final   Culture NO GROWTH 4 DAYS  Final   Report Status PENDING  Incomplete  Culture, bal-quantitative     Status: Abnormal   Collection Time: 01/10/16  2:23 PM  Result Value Ref Range Status   Specimen Description BRONCHIAL ALVEOLAR LAVAGE  Final   Special Requests LLL  Final   Gram Stain   Final    ABUNDANT WBC PRESENT,BOTH PMN AND MONONUCLEAR RARE GRAM POSITIVE COCCI IN CLUSTERS IN PAIRS RARE GRAM NEGATIVE RODS    Culture (A)  Final    >=100,000 COLONIES/mL Consistent with normal respiratory flora.   Report Status 01/13/2016 FINAL  Final     Radiology Studies: No results found.   Scheduled Meds: . amoxicillin-clavulanate  1 tablet Oral Q12H  . enoxaparin (LOVENOX) injection  20 mg Subcutaneous Q24H  . feeding supplement (GLUCERNA SHAKE)  237 mL Oral BID BM  . ferrous gluconate  324 mg Oral Q breakfast  . gabapentin  600 mg Oral TID  . insulin aspart  0-5 Units Subcutaneous QHS  . insulin aspart  0-9 Units Subcutaneous TID WC  . insulin glargine  30 Units Subcutaneous Q2200  . Vitamin D (Ergocalciferol)  50,000 Units Oral Q Fri   Continuous Infusions: . sodium chloride 10 mL/hr at 01/10/16 1356   Anti-infectives    Start     Dose/Rate Route Frequency Ordered Stop   01/14/16 1115  amoxicillin-clavulanate (AUGMENTIN) 875-125 MG per tablet 1 tablet     1 tablet Oral Every 12 hours 01/14/16 1100     01/14/16 0500  vancomycin (VANCOCIN) 500 mg in sodium chloride 0.9 % 100 mL IVPB  Status:  Discontinued     500 mg 66.7 mL/hr over 90 Minutes Intravenous Every 12 hours 01/13/16 1926 01/14/16 1100   01/10/16 1500  vancomycin (VANCOCIN) 500 mg in sodium chloride 0.9 % 100 mL IVPB  Status:  Discontinued     500 mg 66.7 mL/hr over 90 Minutes Intravenous  Every 24 hours 01/09/16 1755 01/13/16 1926   01/10/16 1500  ceFEPIme (MAXIPIME) 1 g in dextrose 5 % 50 mL IVPB  Status:  Discontinued     1 g 100 mL/hr over 30 Minutes Intravenous Every 24 hours 01/09/16 1755 01/10/16 1552   01/09/16 1400  ceFEPIme (MAXIPIME) 1 g in dextrose 5 % 50 mL IVPB  Status:  Discontinued     1 g 100 mL/hr over 30 Minutes Intravenous Every 24 hours 01/09/16 1358 01/14/16 1100   01/09/16 1345  vancomycin (VANCOCIN) IVPB 1000 mg/200 mL premix     1,000 mg 200 mL/hr over 60 Minutes Intravenous  Once 01/09/16 1341 01/09/16 1731      LOS: 5 days    Time spent: 25 minutes  Signature  SINGH,PRASHANT K M.D on 01/14/2016 at 11:31 AM  Between 7am to 7pm - Pager - [phone], After 7pm go to [email or web address] - password [email or web address]  [facility]  - Office  [phone]

## 2016-01-15 ENCOUNTER — Encounter (HOSPITAL_COMMUNITY): Payer: Self-pay | Admitting: Dentistry

## 2016-01-15 ENCOUNTER — Inpatient Hospital Stay (HOSPITAL_COMMUNITY): Payer: Medicare Other

## 2016-01-15 DIAGNOSIS — K0530 Chronic periodontitis, unspecified: Secondary | ICD-10-CM

## 2016-01-15 DIAGNOSIS — M264 Malocclusion, unspecified: Secondary | ICD-10-CM

## 2016-01-15 DIAGNOSIS — J851 Abscess of lung with pneumonia: Secondary | ICD-10-CM | POA: Diagnosis not present

## 2016-01-15 DIAGNOSIS — K060 Gingival recession: Secondary | ICD-10-CM | POA: Diagnosis not present

## 2016-01-15 DIAGNOSIS — J850 Gangrene and necrosis of lung: Secondary | ICD-10-CM | POA: Diagnosis not present

## 2016-01-15 DIAGNOSIS — K08409 Partial loss of teeth, unspecified cause, unspecified class: Secondary | ICD-10-CM

## 2016-01-15 DIAGNOSIS — K0889 Other specified disorders of teeth and supporting structures: Secondary | ICD-10-CM

## 2016-01-15 DIAGNOSIS — K036 Deposits [accretions] on teeth: Secondary | ICD-10-CM

## 2016-01-15 DIAGNOSIS — R079 Chest pain, unspecified: Secondary | ICD-10-CM | POA: Diagnosis not present

## 2016-01-15 LAB — GLUCOSE, CAPILLARY
Glucose-Capillary: 81 mg/dL (ref 65–99)
Glucose-Capillary: 139 mg/dL — ABNORMAL HIGH (ref 65–99)
Glucose-Capillary: 341 mg/dL — ABNORMAL HIGH (ref 65–99)
Glucose-Capillary: 214 mg/dL — ABNORMAL HIGH (ref 65–99)

## 2016-01-15 NOTE — Progress Notes (Signed)
PT Cancellation Note  Patient Details Name: Olivia Williamson MRN: [id number] DOB: [birth year]   Cancelled Treatment:    Reason Eval/Treat Not Completed: Patient at procedure or test/unavailable   Off the floor at ultrasound;   Will follow up later today as time allows;  Otherwise, will follow up for PT tomorrow;   Thank you,  Van ClinesHolly Gatlin Kittell, PT  Acute Rehabilitation Services Pager [phone] Office [phone]     Van ClinesGarrigan, Olivia Williamson 01/15/2016, 11:28 AM

## 2016-01-15 NOTE — Evaluation (Signed)
Clinical/Bedside Swallow Evaluation Patient Details  Name: Olivia Williamson MRN: [id number] Date of Birth: Sep 18, 1945  Today's Date: 01/15/2016 Time: SLP Start Time (ACUTE ONLY): 1438 SLP Stop Time (ACUTE ONLY): 1448 SLP Time Calculation (min) (ACUTE ONLY): 10 min  Past Medical History:  Past Medical History:  Diagnosis Date  . Arthritis   . Diabetes mellitus without complication (HCC)    stopped DM meds 2004  . GERD (gastroesophageal reflux disease)   . Hypertension    meds stopped 1 week ago , need to get renewed   Past Surgical History:  Past Surgical History:  Procedure Laterality Date  . CHOLECYSTECTOMY N/A 05/02/2014   Procedure: LAPAROSCOPIC CHOLECYSTECTOMY ;  Surgeon: Abigail Miyamotoouglas Blackman, MD;  [facility];  Service: General;  Laterality: N/A;  laparoscopic cholecystectomy  . COLONOSCOPY    . NO PAST SURGERIES    . VIDEO BRONCHOSCOPY Bilateral 01/10/2016   Procedure: VIDEO BRONCHOSCOPY WITHOUT FLUORO;  Surgeon: Leslye Peerobert S Byrum, MD;  [facility];  Service: Cardiopulmonary;  Laterality: Bilateral;   HPI:  70 yo female with onset of weight loss and weakness was admitted with Necrotizing PNA w/ Multiple cavitary lung nodules, has lung abscesses and history of DM, neuropathy, HTN and possible memory changes   Assessment / Plan / Recommendation Clinical Impression  Pt's oropharyngeal swallow appears within gross functional limits. One throat clear noted across all intake, which was observed after initial trial of thin liquids. No further overt signs of difficulty or aspiration were noted. Recommend to continue with regular diet and thin liquids as tolerated. No acute SLP f/u indicated.    Aspiration Risk  No limitations    Diet Recommendation Regular;Thin liquid   Liquid Administration via: Cup;Straw Medication Administration: Whole meds with liquid Supervision: Patient able to self feed;Intermittent supervision to cue for compensatory strategies Compensations: Slow  rate;Small sips/bites Postural Changes: Seated upright at 90 degrees    Other  Recommendations Oral Care Recommendations: Oral care BID   Follow up Recommendations  None    Frequency and Duration            Prognosis        Swallow Study   General HPI: 70 yo female with onset of weight loss and weakness was admitted with Necrotizing PNA w/ Multiple cavitary lung nodules, has lung abscesses and history of DM, neuropathy, HTN and possible memory changes Type of Study: Bedside Swallow Evaluation Previous Swallow Assessment: none in chart Diet Prior to this Study: Regular;Thin liquids Temperature Spikes Noted: No Respiratory Status: Room air History of Recent Intubation: No Behavior/Cognition: Alert;Cooperative;Pleasant mood Oral Cavity Assessment: Within Functional Limits Oral Care Completed by SLP: No Oral Cavity - Dentition: Missing dentition Vision: Functional for self-feeding Self-Feeding Abilities: Able to feed self Patient Positioning: Upright in bed Baseline Vocal Quality: Normal    Oral/Motor/Sensory Function Overall Oral Motor/Sensory Function: Within functional limits   Ice Chips Ice chips: Not tested   Thin Liquid Thin Liquid: Within functional limits Presentation: Self Fed;Straw    Nectar Thick Nectar Thick Liquid: Not tested   Honey Thick Honey Thick Liquid: Not tested   Puree Puree: Within functional limits Presentation: Self Fed;Spoon   Solid   GO   Solid: Within functional limits Presentation: Self Georjean ModeFed        Paiewonsky, Kourtlynn Trevor 01/15/2016,3:52 PM  Maxcine HamLaura Paiewonsky, M.A. CCC-SLP [phone]

## 2016-01-15 NOTE — Progress Notes (Addendum)
PROGRESS NOTE    Olivia Williamson  ZOX:[id number] DOB: [birth year] DOA: 01/09/2016 PCP: Pete Glatterawn T Langeland, MD    Brief Narrative:   Patient is a 70 y.o. female with past medical history of diabetes, GERD, severe protein calorie malnutrition presented to the ED on 8/17 with cough, shortness of breath and left-sided chest pain. CT chest on admission showed multiple lung abscesses. She was admitted and started on empiric antibiotics, pulmonology was consulted, bronchoscopy on 8/18.   Assessment & Plan:   Multiple Lung abscess (HCC) - On empiric IV antibiotics which were vancomycin and cefepime transitioned to Augmentin or pulmonary on 01/14/2016,  Bronchoscopy 8/18, pending results,  BAL with 390 WBC, cloudy, bacteria noted, neutrophil predominant, presents for a pierced a normal respiratory flora, AFB smear and the BAL specimen is negative, will defer to moving airborne precautions to pulmonary, no fevers or leukocytosis.  Orthopantogram was ordered to rule out source for metastatic infectious lesions in the lungs from poor dentition, where [facility] will order maxillofacial CT long with speech evaluation to rule out chronic microaspiration. If all stable likely discharge on 01/16/2016 on oral antibiotic with outpatient pulmonary follow-up. Blood cultures have remained negative.   Left sided chest pain- Atypical, 2/2 to above abscesses,  Pain control with norco  Pulmonary nodule - Seen on CT, recommended follow up post treatment as above  Acute colitis, asymptomatic? - Seen on CT scan.  She reports no abdominal pain, diarrhea but imaging shows colonic thickening,  antibiotics for cavitary lung lesions should suffice. Clinically stable and symptom-free from the standpoint.   Mild asymptomatic hyponatremia - Resolved  HTN - stable off meds.   Severe protein calorie malnutrition - Continue supplements  Anemia, microcytic  Anemia panel 1 month ago was relatively normal  and microcytosis was present, Target cells on previous smear,  ? Thalassemia - trend hemoglobin had an outpatient workup as needed.   DM2 -  Continue lantus increased, continue SSI - Also on metformin at home.   CBG (last 3)   Recent Labs  01/14/16 1654 01/14/16 2215 01/15/16 0801  GLUCAP 301* 265* 81     DVT prophylaxis: Lovenox Code Status: Full Family Communication: Son at bedside Disposition Plan: Pending Bronch culture results  Consultants:    PCCM  DM coordinator  Procedures:   Bronchoscopy 8/18  Antimicrobials:   Vancomycin 8/17 -- > now  Cefepime 8/17 --> now    Subjective:  Patient reports doing well through son. She denies any fever or chills, minimal cough which is now nonproductive, denies any chest pain or shortness of breath.    Objective: Vitals:   01/14/16 1751 01/14/16 2222 01/15/16 0634 01/15/16 0801  BP: (!) 144/63 139/65 (!) 117/55 104/64  Pulse: 82 80 79 82  Resp: 16 18 16 14   Temp: 98.4 F (36.9 C)  98.3 F (36.8 C) 98.4 F (36.9 C)  TempSrc: Oral  Oral Oral  SpO2: 98% 98% 96% 100%  Weight:      Height:        Intake/Output Summary (Last 24 hours) at 01/15/16 1005 Last data filed at 01/15/16 0802  Gross per 24 hour  Intake              480 ml  Output              775 ml  Net             -295 ml   [employer] Weights  01/10/16 2104 01/11/16 2011 01/12/16 2021  Weight: 35.4 kg (78 lb 0.7 oz) 35.8 kg (78 lb 14.8 oz) 36.2 kg (79 lb 12.9 oz)    Examination:  General exam:  frail elderly woman Eyes: Anicteric sclerae, non injected conjunctivae Respiratory system: Breathing comfortably, no audible wheezing Cardiovascular system: S1 & S2 heard, RR, NR Gastrointestinal system: Abdomen is scaphoid, soft and nontender. +BS Central nervous system: Alert and oriented.  Extremities: moving all extremities equally, no lower extremity edema noted Skin: No rashes, lesions or ulcers noted    Data Reviewed:  CBC:  Recent Labs Lab  01/09/16 0515 01/10/16 0322 01/11/16 0503 01/12/16 0520 01/13/16 0847  WBC 7.7 6.3 6.1 8.5 6.2  HGB 9.7* 8.7* 8.7* 8.1* 8.2*  HCT 29.0* 27.7* 27.6* 25.9* 26.6*  MCV 63.2* 64.3* 64.8* 64.4* 65.0*  PLT 169 141* 150 132* 124*   Basic Metabolic Panel:  Recent Labs Lab 01/09/16 0515 01/10/16 0322 01/11/16 0503 01/13/16 0847  NA 133* 133* 139 139  K 4.4 3.5 3.6 3.6  CL 97* 96* 105 106  CO2 27 28 26 29   GLUCOSE 136* 216* 64* 110*  BUN 27* 14 10 13   CREATININE 0.64 0.74  0.75 0.54 0.49  CALCIUM 9.5 8.9 9.0 8.6*  MG  --  1.5*  --   --   PHOS  --  4.1  --   --    GFR: Estimated Creatinine Clearance: 39 mL/min (by C-G formula based on SCr of 0.8 mg/dL). Liver Function Tests:  Recent Labs Lab 01/09/16 1830 01/13/16 0847  AST  --  17  ALT  --  19  ALKPHOS  --  54  BILITOT  --  0.7  PROT  --  5.4*  ALBUMIN 3.2* 2.5*   No results for input(s): LIPASE, AMYLASE in the last 168 hours. No results for input(s): AMMONIA in the last 168 hours. Coagulation Profile: No results for input(s): INR, PROTIME in the last 168 hours. Cardiac Enzymes:  Recent Labs Lab 01/09/16 0747 01/09/16 1205  TROPONINI <0.03 <0.03   BNP (last 3 results) No results for input(s): PROBNP in the last 8760 hours. HbA1C: No results for input(s): HGBA1C in the last 72 hours. CBG:  Recent Labs Lab 01/14/16 0845 01/14/16 1332 01/14/16 1654 01/14/16 2215 01/15/16 0801  GLUCAP 102* 221* 301* 265* 81   Lipid Profile: No results for input(s): CHOL, HDL, LDLCALC, TRIG, CHOLHDL, LDLDIRECT in the last 72 hours. Thyroid Function Tests: No results for input(s): TSH, T4TOTAL, FREET4, T3FREE, THYROIDAB in the last 72 hours. Anemia Panel: No results for input(s): VITAMINB12, FOLATE, FERRITIN, TIBC, IRON, RETICCTPCT in the last 72 hours. Sepsis Labs:  Recent Labs Lab 01/09/16 1842  LATICACIDVEN 1.39    Recent Results (from the past 240 hour(s))  Blood culture (routine x 2)     Status: None    Collection Time: 01/09/16  1:45 PM  Result Value Ref Range Status   Specimen Description BLOOD RIGHT HAND  Final   Special Requests BOTTLES DRAWN AEROBIC AND ANAEROBIC 5CC  Final   Culture NO GROWTH 5 DAYS  Final   Report Status 01/14/2016 FINAL  Final  Blood culture (routine x 2)     Status: None   Collection Time: 01/09/16  1:50 PM  Result Value Ref Range Status   Specimen Description BLOOD LEFT HAND  Final   Special Requests BOTTLES DRAWN AEROBIC AND ANAEROBIC 5CC  Final   Culture NO GROWTH 5 DAYS  Final   Report Status 01/14/2016  FINAL  Final  Culture, bal-quantitative     Status: Abnormal   Collection Time: 01/10/16  2:23 PM  Result Value Ref Range Status   Specimen Description BRONCHIAL ALVEOLAR LAVAGE  Final   Special Requests LLL  Final   Gram Stain   Final    ABUNDANT WBC PRESENT,BOTH PMN AND MONONUCLEAR RARE GRAM POSITIVE COCCI IN CLUSTERS IN PAIRS RARE GRAM NEGATIVE RODS    Culture (A)  Final    >=100,000 COLONIES/mL Consistent with normal respiratory flora.   Report Status 01/13/2016 FINAL  Final  Acid Fast Smear (AFB)     Status: None   Collection Time: 01/10/16  2:23 PM  Result Value Ref Range Status   AFB Specimen Processing Concentration  Final   Acid Fast Smear Negative  Final    Comment: (NOTE) Performed At: [facility] [address] [place], [place] [id number] Mila HomerHancock William F MD WU:[id number]:[phone]    Source (AFB) BRONCHIAL ALVEOLAR LAVAGE  Final    Comment: LLL     Radiology Studies: No results found.   Scheduled Meds: . amoxicillin-clavulanate  1 tablet Oral Q12H  . enoxaparin (LOVENOX) injection  20 mg Subcutaneous Q24H  . feeding supplement (GLUCERNA SHAKE)  237 mL Oral BID BM  . ferrous gluconate  324 mg Oral Q breakfast  . gabapentin  600 mg Oral TID  . insulin aspart  0-5 Units Subcutaneous QHS  . insulin aspart  0-9 Units Subcutaneous TID WC  . insulin glargine  30 Units Subcutaneous Q2200  . Vitamin D (Ergocalciferol)  50,000 Units  Oral Q Fri   Continuous Infusions: . sodium chloride 10 mL/hr at 01/10/16 1356   Anti-infectives    Start     Dose/Rate Route Frequency Ordered Stop   01/14/16 1115  amoxicillin-clavulanate (AUGMENTIN) 875-125 MG per tablet 1 tablet     1 tablet Oral Every 12 hours 01/14/16 1100     01/14/16 0500  vancomycin (VANCOCIN) 500 mg in sodium chloride 0.9 % 100 mL IVPB  Status:  Discontinued     500 mg 66.7 mL/hr over 90 Minutes Intravenous Every 12 hours 01/13/16 1926 01/14/16 1100   01/10/16 1500  vancomycin (VANCOCIN) 500 mg in sodium chloride 0.9 % 100 mL IVPB  Status:  Discontinued     500 mg 66.7 mL/hr over 90 Minutes Intravenous Every 24 hours 01/09/16 1755 01/13/16 1926   01/10/16 1500  ceFEPIme (MAXIPIME) 1 g in dextrose 5 % 50 mL IVPB  Status:  Discontinued     1 g 100 mL/hr over 30 Minutes Intravenous Every 24 hours 01/09/16 1755 01/10/16 1552   01/09/16 1400  ceFEPIme (MAXIPIME) 1 g in dextrose 5 % 50 mL IVPB  Status:  Discontinued     1 g 100 mL/hr over 30 Minutes Intravenous Every 24 hours 01/09/16 1358 01/14/16 1100   01/09/16 1345  vancomycin (VANCOCIN) IVPB 1000 mg/200 mL premix     1,000 mg 200 mL/hr over 60 Minutes Intravenous  Once 01/09/16 1341 01/09/16 1731      LOS: 6 days    Time spent: 25 minutes  Signature  Earline Stiner K M.D on 01/15/2016 at 10:05 AM  Between 7am to 7pm - Pager - [phone], After 7pm go to [email or web address] - password [email or web address]  [facility]  - Office  [phone]

## 2016-01-15 NOTE — Consult Note (Signed)
DENTAL CONSULTATION  Date of Consultation:  01/15/2016 Patient Name:   Olivia Williamson Date of Birth:   1946-03-20 Medical Record Number: [id number]  VITALS: BP 104/64 (BP Location: Left Arm)   Pulse 82   Temp 98.4 F (36.9 C) (Oral)   Resp 14   Ht 5\' 2"  (1.575 m)   Wt 79 lb 12.9 oz (36.2 kg)   SpO2 100%   BMI 14.60 kg/m   CHIEF COMPLAINT: Patient referred by Dr. Thedore MinsSingh for dental consultation.  HPI: Olivia Williamson is a 70 year old [place] female recently admitted with multiple lung abscesses.  Patient currently on IV antibiotic therapy. Patient found to have poor dentition. Dental consultation requested to evaluate dentition as possible source of the pulmonary infection.  Patient currently denies any significant dental discomfort. Remaining teeth have hurt her over the past several months. Patient has not seen a dentist recently and has no regular primary dentist. Patient denies having partial dentures. Patient denies having dental phobia.  [place] past 6[place]7 years.  The patient is originally from [place].  PROBLEM LIST: Patient Active Problem List   Diagnosis Date Noted  . Pneumonia   . Chest pain   . Abnormal CT of the chest   . Lung abscess (HCC) 01/09/2016  . Protein-calorie malnutrition, severe 12/16/2015  . Generalized weakness 12/15/2015  . Hyponatremia 12/15/2015  . Anemia 12/15/2015  . Hyperglycemia 12/15/2015  . Nausea without vomiting 12/15/2015  . Hypertension   . GERD (gastroesophageal reflux disease)   . Diabetes mellitus with neurological manifestations (HCC)     PMH: Past Medical History:  Diagnosis Date  . Arthritis   . Diabetes mellitus without complication (HCC)    stopped DM meds 2004  . GERD (gastroesophageal reflux disease)   . Hypertension    meds stopped 1 week ago , need to get renewed    PSH: Past Surgical History:  Procedure Laterality Date  . CHOLECYSTECTOMY N/A 05/02/2014   Procedure: LAPAROSCOPIC  CHOLECYSTECTOMY ;  Surgeon: Abigail Miyamotoouglas Blackman, MD;  [facility];  Service: General;  Laterality: N/A;  laparoscopic cholecystectomy  . COLONOSCOPY    . NO PAST SURGERIES    . VIDEO BRONCHOSCOPY Bilateral 01/10/2016   Procedure: VIDEO BRONCHOSCOPY WITHOUT FLUORO;  Surgeon: Leslye Peerobert S Byrum, MD;  [facility];  Service: Cardiopulmonary;  Laterality: Bilateral;    ALLERGIES: No Known Allergies  MEDICATIONS: Current Facility-Administered Medications  Medication Dose Route Frequency Provider Last Rate Last Dose  . 0.9 %  sodium chloride infusion   Intravenous Continuous Leslye Peerobert S Byrum, MD 10 mL/hr at 01/10/16 1356    . acetaminophen (TYLENOL) tablet 650 mg  650 mg Oral Q6H PRN Inez CatalinaEmily B Mullen, MD   650 mg at 01/13/16 2104  . amoxicillin-clavulanate (AUGMENTIN) 875-125 MG per tablet 1 tablet  1 tablet Oral Q12H Simonne MartinetPeter E Babcock, NP   1 tablet at 01/15/16 1204  . enoxaparin (LOVENOX) injection 20 mg  20 mg Subcutaneous Q24H Cassie L Stewart, RPH   20 mg at 01/14/16 1719  . feeding supplement (GLUCERNA SHAKE) (GLUCERNA SHAKE) liquid 237 mL  237 mL Oral BID BM Barnetta ChapelSylvester I Ogbata, MD   237 mL at 01/15/16 1204  . ferrous gluconate (FERGON) tablet 324 mg  324 mg Oral Q breakfast Barnetta ChapelSylvester I Ogbata, MD   324 mg at 01/15/16 1204  . gabapentin (NEURONTIN) capsule 600 mg  600 mg Oral TID Barnetta ChapelSylvester I Ogbata, MD   600 mg at 01/15/16 1204  .  insulin aspart (novoLOG) injection 0-5 Units  0-5 Units Subcutaneous QHS Barnetta ChapelSylvester I Ogbata, MD   3 Units at 01/14/16 2217  . insulin aspart (novoLOG) injection 0-9 Units  0-9 Units Subcutaneous TID WC Barnetta ChapelSylvester I Ogbata, MD   1 Units at 01/15/16 1205  . insulin glargine (LANTUS) injection 30 Units  30 Units Subcutaneous Q2200 Leroy SeaPrashant K Singh, MD   30 Units at 01/14/16 2218  . Vitamin D (Ergocalciferol) (DRISDOL) capsule 50,000 Units  50,000 Units Oral Q Maudry MayhewFri Sylvester I Ogbata, MD   50,000 Units at 01/10/16 0827    LABS: Lab Results  Component Value Date   WBC  6.2 01/13/2016   HGB 8.2 (L) 01/13/2016   HCT 26.6 (L) 01/13/2016   MCV 65.0 (L) 01/13/2016   PLT 124 (L) 01/13/2016      Component Value Date/Time   NA 139 01/13/2016 0847   K 3.6 01/13/2016 0847   CL 106 01/13/2016 0847   CO2 29 01/13/2016 0847   GLUCOSE 110 (H) 01/13/2016 0847   BUN 13 01/13/2016 0847   CREATININE 0.49 01/13/2016 0847   CREATININE 0.50 12/30/2015 1210   CALCIUM 8.6 (L) 01/13/2016 0847   GFRNONAA >60 01/13/2016 0847   GFRNONAA >89 12/30/2015 1210   GFRAA >60 01/13/2016 0847   GFRAA >89 12/30/2015 1210   No results found for: INR, PROTIME No results found for: PTT  SOCIAL HISTORY: Social History   Social History  . Marital status: Widowed    Spouse name: N/A  . Number of children: N/A  . Years of education: N/A   Occupational History  . Not on file.   Social History Main Topics  . Smoking status: Never Smoker  . Smokeless tobacco: Never Used  . Alcohol use No  . Drug use: No  . Sexual activity: Not on file   Other Topics Concern  . Not on file   Social History Narrative  . No narrative on file    FAMILY HISTORY: History reviewed. No pertinent family history.  REVIEW OF SYSTEMS: Reviewed With the patient and son as per history of present illness.  Psych: Patient denies having dental phobia.  DENTAL HISTORY: CHIEF COMPLAINT: Patient referred by Dr. Thedore MinsSingh for dental consultation.  HPI: Olivia Williamson is a 70 year old [place] female recently admitted with multiple lung abscesses.  Patient currently on IV antibiotic therapy. Patient found to have poor dentition. Dental consultation requested to evaluate dentition as possible source of the pulmonary infection.  Patient currently denies any significant dental discomfort. Remaining teeth have hurt her over the past several months. Patient has not seen a dentist recently and has no regular primary dentist. Patient denies having partial dentures. Patient denies having dental phobia.  [place] has  been [place] the past 6[place]7 years.  The patient is originally from [place].  DENTAL EXAMINATION: GENERAL: The patient is a well-developed, slightly built female in no acute distress. HEAD AND NECK: There is no palpable submandibular lymphadenopathy. The patient denies acute TMJ symptoms. Patient has maximum interincisal opening of 40 mm. There is no evidence of facial swelling. INTRAORAL EXAM: Patient has normal saliva. There is no evidence of oral abscess formation. There is atrophy of edentulous alveolar ridges. DENTITION: Patient is missing tooth numbers 1-4, 9, 14-16, 19-25, and 30-32. PERIODONTAL: The patient has chronic periodontal disease with plaque and calculus accumulations, generalized gingival recession, and generalized tooth mobility.  There is moderate to severe bone loss noted. DENTAL CARIES/SUBOPTIMAL RESTORATIONS: Multiple dental caries are noted.  ENDODONTIC: Patient with a history of acute pulpitis symptoms although none today. Patient may have periapical pathology but unable to be seen secondary to suboptimal orthopantogram. CROWN AND BRIDGE: No crown or bridge restorations noted. PROSTHODONTIC: No partial dentures. OCCLUSION: The patient has a poor occlusal scheme secondary to multiple missing teeth, supra-eruption and drifting of the unopposed teeth into the edentulous areas, and lack of replacement of the missing teeth with dental prostheses.  RADIOGRAPHIC INTERPRETATION: An orthopantogram was taken on 01/15/16. This is suboptimal. There are multiple missing teeth. There is moderate to severe bone loss. There is supra-eruption and drifting of the unopposed teeth into the edentulous areas. A poor occlusal scheme is noted.  ASSESSMENTS: 1. Multiple lung abscesses with current IV antibiotic therapy 2. History of acute pulpitis 3. 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 Generalized gingival recession 5. Generalized tooth mobility 6.  Accretions 7. Multiple missing teeth 8. Supra-eruption and drifting of the unopposed teeth into the edentulous areas 9. No history of partial dentures 10. Malocclusion 11. Atrophy of edentulous alveolar ridges  PLAN/RECOMMENDATIONS: 1. I discussed the risks, benefits, and complications of various treatment options with the patient and her son in relationship to the medical and dental conditions. We discussed various treatment options to include no treatment, multiple extractions with alveoloplasty, pre-prosthetic surgery as indicated, periodontal therapy, implant therapy, and replacement of missing teeth as indicated by the dentist of her choice after adequate healing. I would like to evaluate the patient as an outpatient for further dental radiographs and discussion of treatment options with an interpreter present. The patient will ideally need removal of remaining teeth with alveoloplasty and pre-prosthetic surgery in the operating room with general anesthesia once she is medically stable to undergo these procedures. In the interim, the patient will be continued IV antibiotic therapy and converted to oral antibiotic therapy and then discharge as indicated. Patient be seen in 1-2 weeks as an outpatient for the patient dental evaluation and scheduling of operating room procedure as indicated.    2. Discussion of findings with medical team and coordination of future medical and dental care as needed.    Charlynne Pander, DDS

## 2016-01-15 NOTE — Progress Notes (Signed)
Inpatient Diabetes Program Recommendations  AACE/ADA: New Consensus Statement on Inpatient Glycemic Control (2015)  Target Ranges:  Prepandial:   less than 140 mg/dL      Peak postprandial:   less than 180 mg/dL (1-2 hours)      Critically ill patients:  140 - 180 mg/dL   Lab Results  Component Value Date   GLUCAP 139 (H) 01/15/2016   HGBA1C 12.2 (H) 12/15/2015    Review of Glycemic Control:  Results for Hulan SaasYA, [facility] (MRN [id number]) as of 01/15/2016 13:43  Ref. Range 01/14/2016 13:32 01/14/2016 16:54 01/14/2016 22:15 01/15/2016 08:01 01/15/2016 12:02  Glucose-Capillary Latest Ref Range: 65 - 99 mg/dL 086221 (H) 578301 (H) 469265 (H) 81 139 (H)   Inpatient Diabetes Program Recommendations:   Consider further reduction of Lantus to 25 units q HS since CBG=81 mg/dL this morning.  Also may consider adding Novolog meal coverage 2 units tid with meals (hold if patient not eating).  Thanks, Beryl MeagerJenny Loran Auguste, RN, BC-ADM Inpatient Diabetes Coordinator Pager [phone] (8a-5p)

## 2016-01-15 NOTE — Progress Notes (Signed)
Physical Therapy Treatment Patient Details Name: Olivia Williamson MRN: [id number] DOB: 04/05/1946 Today's Date: 01/15/2016    History of Present Illness 70 yo female with onset of weight loss and weakness was admitted with Necrotizing PNA w/ Multiple cavitary lung nodules, has lung abscesses and history of DM, neuropathy, HTN and possible memory changes    PT Comments    Noting improvements in functional mobility and activity tolerance; walked with cane and RW, and overall use of the cane is safe and the assistive device with which she is familiar; Will continue to follow while in house; plan for stair training next session  Follow Up Recommendations  Home health PT;Supervision for mobility/OOB     Equipment Recommendations  None recommended by PT    Recommendations for Other Services       Precautions / Restrictions Precautions Precautions: Fall Restrictions Weight Bearing Restrictions: No    Mobility  Bed Mobility Overal bed mobility: Modified Independent             General bed mobility comments: moving slowly, but independently  Transfers Overall transfer level: Needs assistance Equipment used: None Transfers: Sit to/from Stand Sit to Stand: Min guard         General transfer comment: relies on UEs to push off and steady self at initial stand (reached forward and held onto rolling table  Ambulation/Gait Ambulation/Gait assistance: Min guard;Supervision Ambulation Distance (Feet): 300 Feet Assistive device: Straight cane;Rolling walker (2 wheeled) Gait Pattern/deviations: Decreased step length - right;Decreased step length - left;Decreased stride length Gait velocity: reduced   General Gait Details: first walked with single point cane; sized cane for optimal height and fit; Walked in hallway with cane without overt loss of balance; then walked with RW, which pt used relatively well and reported the RW was fine; Ultimately her steps were smoother and gait pattern  was more efficient with the cane   Stairs            Wheelchair Mobility    Modified Rankin (Stroke Patients Only)       Balance     Sitting balance-Leahy Scale: Good       Standing balance-Leahy Scale: Fair                      Cognition Arousal/Alertness: Awake/alert Behavior During Therapy: Flat affect Overall Cognitive Status: Within Functional Limits for tasks assessed (for simple mobility tasks)                      Exercises      General Comments        Pertinent Vitals/Pain Pain Assessment: Faces Faces Pain Scale: Hurts a little bit Pain Location: R chest and LLE; did not rate pain; stated she did not have pain at teh end of her walk Pain Descriptors / Indicators: Discomfort Pain Intervention(s): Monitored during session    Home Living                      Prior Function            PT Goals (current goals can now be found in the care plan section) Acute Rehab PT Goals Patient Stated Goal: Hopes to get home PT Goal Formulation: With patient/family Time For Goal Achievement: 01/25/16 Potential to Achieve Goals: Good Progress towards PT goals: Progressing toward goals    Frequency  Min 3X/week    PT Plan Current plan remains appropriate;Equipment recommendations need to be  updated    Co-evaluation             End of Session Equipment Utilized During Treatment: Gait belt Activity Tolerance: Patient tolerated treatment well Patient left: in bed;with call bell/phone within reach;with family/visitor present     Time: 1136-1206 PT Time Calculation (min) (ACUTE ONLY): 30 min  Charges:  $Gait Training: 23-37 mins                    G Codes:      Olen PelGarrigan, Zarif Rathje Hamff 01/15/2016, 2:18 PM   Van ClinesHolly Derrious [place], [place]  Acute Rehabilitation Services Pager [phone] Office [phone]

## 2016-01-15 NOTE — Care Management Important Message (Signed)
Important Message  Patient Details  Name: Olivia Williamson MRN: [id number] Date of Birth: [birth year]   Medicare Important Message Given:  Yes    Tristine Langi, Annamarie MajorCheryl U, RN 01/15/2016, 10:25 AM

## 2016-01-16 DIAGNOSIS — R938 Abnormal findings on diagnostic imaging of other specified body structures: Secondary | ICD-10-CM | POA: Diagnosis not present

## 2016-01-16 DIAGNOSIS — R079 Chest pain, unspecified: Secondary | ICD-10-CM | POA: Diagnosis not present

## 2016-01-16 DIAGNOSIS — J850 Gangrene and necrosis of lung: Secondary | ICD-10-CM | POA: Diagnosis not present

## 2016-01-16 LAB — GLUCOSE, CAPILLARY: Glucose-Capillary: 72 mg/dL (ref 65–99)

## 2016-01-16 MED ORDER — SACCHAROMYCES BOULARDII 250 MG PO CAPS: 250.0000 mg | ORAL_CAPSULE | Freq: Two times a day (BID) | ORAL | 2 refills | Status: DC

## 2016-01-16 MED ORDER — AMOXICILLIN-POT CLAVULANATE 875-125 MG PO TABS: 1.0000 | ORAL_TABLET | Freq: Two times a day (BID) | ORAL | 0 refills | Status: AC

## 2016-01-16 NOTE — Care Management Note (Addendum)
Case Management Note  Patient Details  Name: Olivia Williamson MRN: [id number] Date of Birth: [birth year]  Subjective/Objective:    CM following for progression and d/c planning.                 Action/Plan: 01/16/2016 [facility] English speaking [facility].  Discussed with pt grandson, he states that this pt uses a cane and has no further DME needs and [facility]   Expected Discharge Date:     01/16/2016             Expected Discharge Plan:  Home w Home Health Services  In-House Referral:  NA  Discharge planning Services  CM Consult  Post Acute Care Choice:  Home Health Choice offered to:  NA  DME Arranged:  N/A DME Agency:  NA  HH Arranged:  RN, PT, Nurse's Aide HH Agency:  [facility]  Status of Service:  Completed, signed off  If discussed at Long Length of Stay Meetings, dates discussed:    Additional Comments:  Starlyn SkeansRoyal, Rosezetta Balderston U, RN 01/16/2016, 10:06 AM

## 2016-01-16 NOTE — Discharge Summary (Signed)
Olivia Williamson PFX:[id number] DOB: [birth year] DOA: 01/09/2016  PCP: Maren Reamer, MD  Admit date: 01/09/2016  Discharge date: 01/16/2016  Admitted From: Home   Disposition:  Home   Recommendations for Outpatient Follow-up:   Follow up with PCP in 1-2 weeks  PCP Please obtain BMP/CBC, 2 view CXR in 1week,  (see Discharge instructions)   PCP Please follow up on the following pending results: She needs outpatient pulmonary, GI follow-up in 1-2 weeks, outpatient anemia workup as age-appropriate.   Home Health: HHPT-RN Equipment/Devices: None  Consultations: PCCM Discharge Condition: Stable   CODE STATUS: Full   Diet Recommendation: Heart healthy, low carbohydrate   Chief Complaint  Patient presents with  . Chest Pain  . Foot Pain     Brief history of present illness from the day of admission and additional interim summary     Patient is a 70 y.o.femalewith past medical history of diabetes, GERD, severe protein calorie malnutrition presented to the ED on 8/17 with cough, shortness of breath and left-sided chest pain. CT chest on admission showed multiple lung abscesses. She was admitted and started on empiric antibiotics, pulmonology was consulted, bronchoscopy on 8/18.    Hospital issues addressed     Multiple Lung abscess ([place]) - On empiric IV antibiotics which were vancomycin and cefepime transitioned to Augmentin or pulmonary on 01/14/2016,  Bronchoscopy 8/18, pending results,  BAL with 390 WBC, cloudy, bacteria noted, neutrophil predominant, presents for a pierced a normal respiratory flora, AFB smear and the BAL specimen is negative, will defer to moving airborne precautions to pulmonary, no fevers or leukocytosis.  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   Left sided chest pain- Atypical, 2/2 to above abscesses,   now pain-free.  Pulmonary nodule - Seen on CT, recommended follow up post treatment as above she is supposed to follow with pulmonary in 6 weeks appointment has been set.  Acute colitis, asymptomatic? - Seen on CT scan.  She reports no abdominal pain, diarrhea but imaging shows colonic thickening,  antibiotics for cavitary lung lesions should suffice. Clinically stable and symptom-free from the standpoint. Recurrent PCP outpatient GI follow-up one time for routine colonoscopy and evaluation.  Mild asymptomatic hyponatremia - Resolved  HTN - stable off meds.   Severe protein calorie malnutrition - Continue supplements  Anemia, microcytic  Anemia panel 1 month ago was relatively normal and microcytosis was present, Target cells on previous smear,  ? Thalassemia - trend hemoglobin had an outpatient workup as needed.   DM2 resume home regimen.     Discharge diagnosis     Active Problems:   Lung abscess ([place])   Abnormal CT of the chest  Chest pain   Pneumonia    Discharge instructions    Discharge Instructions    Discharge instructions    Complete by:  As directed   Follow with Primary MD Maren Reamer, MD in 7 days   Get CBC, CMP, 2 view Chest X ray checked  by Primary MD or SNF MD in 5-7 days ( we routinely change or add medications that can affect your baseline labs and fluid status, therefore we recommend that you get the mentioned basic workup next visit with your PCP, your PCP may decide not to get them or add new tests based on their clinical decision)   Activity:  As tolerated with Full fall precautions use walker/cane & assistance as needed   Disposition Home **   Diet:   Heart Healthy Low Carb.  Accuchecks 4 times/day, Once in AM empty stomach and then before each meal. Log in all results and show them to your Prim.MD in 3 days. If any glucose reading is under 80 or above 300 call your Prim MD immidiately. Follow Low glucose instructions for glucose under 80 as instructed.  For Heart failure patients - Check your Weight same time everyday, if you gain over 2 pounds, or you develop in leg swelling, experience more shortness of breath or chest pain, call your Primary MD immediately. Follow Cardiac Low Salt Diet and 1.5 lit/day fluid restriction.   On your next visit with your primary care physician please Get Medicines reviewed and adjusted.   Please request your Prim.MD to go over all Hospital Tests and Procedure/Radiological results at the follow up, please get all Hospital records sent to your Prim MD by signing hospital release before you go home.   If you experience worsening of your admission symptoms, develop shortness of breath, life threatening emergency, suicidal or homicidal thoughts you must seek medical attention immediately by calling 911 or calling your MD immediately  if symptoms less severe.  You Must read complete instructions/literature along with all the possible adverse reactions/side effects for all the Medicines you take and that have been prescribed to you. Take any new Medicines after you have completely understood and accpet all the possible adverse reactions/side effects.   Do not drive, operate heavy machinery, perform activities at heights, swimming or participation in water activities or provide baby sitting services if your were admitted for syncope or siezures until you have seen by Primary MD or a Neurologist and advised to do so again.  Do not drive when taking Pain medications.    Do not take more than  prescribed Pain, Sleep and Anxiety Medications  Special Instructions: If you have smoked or chewed Tobacco  in the last 2 yrs please stop smoking, stop any regular Alcohol  and or any Recreational drug use.  Wear Seat belts while driving.   Please note  You were cared for by a hospitalist during your hospital stay. If you have any questions about your discharge medications or the care you received while you were in the hospital after you are discharged, you can call the unit and asked to speak with the hospitalist on call if the hospitalist that took care of you is not available. Once you are discharged, your primary care physician will handle any further medical issues. Please note that NO REFILLS for any discharge medications will be authorized once you are discharged, as it is imperative that you return to your primary care physician (or establish a relationship with a primary care physician if  you do not have one) for your aftercare needs so that they can reassess your need for medications and monitor your lab values.   Increase activity slowly    Complete by:  As directed      Discharge Medications     Medication List    TAKE these medications   accu-chek softclix lancets Use as instructed   amoxicillin-clavulanate 875-125 MG tablet Commonly known as:  AUGMENTIN Take 1 tablet by mouth every 12 (twelve) hours.   blood glucose meter kit and supplies Kit Dispense based on patient and insurance preference. Use up to four times daily as directed. (FOR ICD-9 250.00, 250.01).   feeding supplement (GLUCERNA SHAKE) Liqd Take 237 mLs by mouth 2 (two) times daily between meals.   Ferrous Gluconate 324 (37.5 Fe) MG Tabs Take 1 tablet (324 mg total) by mouth daily.   gabapentin 300 MG capsule Commonly known as:  NEURONTIN Take 2 capsules (600 mg total) by mouth 3 (three) times daily.   glucose blood test strip Use as instructed   Insulin Glargine 100 UNIT/ML Solostar Pen Commonly  known as:  LANTUS SOLOSTAR Inject 20 Units into the skin daily at 10 pm.   Insulin Pen Needle 32G X 4 MM Misc Commonly known as:  ULTICARE MICRO PEN NEEDLES 1 applicator by Does not apply route at bedtime.   metFORMIN 1000 MG tablet Commonly known as:  GLUCOPHAGE Take 1 tablet (1,000 mg total) by mouth 2 (two) times daily with a meal.   saccharomyces boulardii 250 MG capsule Commonly known as:  FLORASTOR Take 1 capsule (250 mg total) by mouth 2 (two) times daily. Any probiotic that is covered   Vitamin D (Ergocalciferol) 50000 units Caps capsule Commonly known as:  DRISDOL Take 1 capsule (50,000 Units total) by mouth every 7 (seven) days.       Follow-up Information    Collene Gobble., MD. Daphane Shepherd on 03/05/2016.   Specialty:  Pulmonary Disease Why:  at 345 pm  Contact information: [address] [place] [address] [phone]        Maren Reamer, MD. Schedule an appointment as soon as possible for a visit in 1 week(s).   Specialty:  Internal Medicine Contact information: [place] [address] [place], DDS. Schedule an appointment as soon as possible for a visit in 1 week(s).   Specialty:  Dentistry Contact information: [place] [address] [phone]           Major procedures and Radiology Reports - PLEASE review detailed and final reports thoroughly  -         Dg Orthopantogram  Result Date: 01/15/2016 CLINICAL DATA:  Necrotizing pneumonia and pulmonary abscess. Tooth pain. EXAM: ORTHOPANTOGRAM/PANORAMIC COMPARISON:  Maxillofacial CT earlier today. FINDINGS: No obvious dental caries or abscess by orthopantogram. The midline incisors are blurred. No bony lesions are seen. IMPRESSION: No obvious dental abscess by orthopantogram. Electronically Signed   By: Aletta Edouard M.D.   On: 01/15/2016 14:44   Dg Chest 2 View  Result Date: 01/09/2016 CLINICAL DATA:  Left-sided anterior and  posterior chest pain and bilateral foot pain. History of diabetes, smoker, hypertension. EXAM: CHEST  2 VIEW COMPARISON:  None. FINDINGS: Normal heart size and pulmonary vascularity. Multiple pulmonary nodules are demonstrated. There is a 15 mm cavitary nodule in the right lung base, a 15 mm solid nodule more laterally in the right lung base, and  a 13 mm solid nodule projected over the posterior mid lung on the lateral view. Appearances are worrisome for primary or metastatic disease and CT is recommended for further evaluation. No blunting of costophrenic angles. No pneumothorax. Mediastinal contours appear intact. Calcification of the aorta. Surgical clips in the right upper quadrant. IMPRESSION: Three pulmonary nodules identified. Primary or metastatic disease is suspected. CT recommended for further evaluation. Electronically Signed   By: Lucienne Capers M.D.   On: 01/09/2016 06:32   Ct Chest W Contrast  Result Date: 01/09/2016 CLINICAL DATA:  70 year old female non English speaker with cough, weakness, fatigue, chest pain. Initial encounter. EXAM: CT CHEST, ABDOMEN, AND PELVIS WITH CONTRAST TECHNIQUE: Multidetector CT imaging of the chest, abdomen and pelvis was performed following the standard protocol during bolus administration of intravenous contrast. CONTRAST:  14m ISOVUE-300 IOPAMIDOL (ISOVUE-300) INJECTION 61% COMPARISON:  Chest radiographs 0612 hours today. CT Abdomen and Pelvis 05/04/2014. FINDINGS: CT CHEST FINDINGS No pericardial effusion. Central pulmonary artery enlargement to the same caliber as the ascending aorta is noted. Major mediastinal vascular structures remain patent. Mild mostly noncalcified thoracic aortic atherosclerosis. No hilar or mediastinal lymphadenopathy. Small calcified right peritracheal lymph nodes. No axillary lymphadenopathy. Major airways are patent. In the superior segment of the left lower lobe there is consolidation with surrounding peribronchial nodularity, but  also a 13 mm air-fluid collection located within the consolidated lung (series 2, image 26). There is mild narrowing of adjacent airways. There is no associated left pleural effusion. On the 2015 comparison there was right lower lobe pneumonia. Right lower lobe ventilation has significantly improved since that time but there is a new mildly thick-walled 15 mm air-fluid collection in the right lower lobe on series 3, image 96 with surrounding tree-in-bud nodularity. There is also a a 15-16 mm new costophrenic angle pulmonary nodule on that side. Calcified granuloma right middle lobe. There is mild bronchiectasis and atelectasis in the right middle lobe. There is superimposed mild lung base atelectasis. No pleural effusion. No acute osseous abnormality identified. Low-density 2.3 cm left thyroid nodule. Additional smaller coarsely calcified and hypodense bilateral thyroid nodules. No thoracic inlet lymphadenopathy. CT ABDOMEN PELVIS FINDINGS No acute osseous abnormality identified. Mildly distended urinary bladder.  Negative uterus and adnexa. Indistinct rectum with wall thickening up to 9 mm. Small volume contained stool and fluid in the rectum. Similar appearance of the sigmoid colon which is decompressed. Wall thickening continues throughout the left colon, but is less apparent at the splenic flexure. Mild wall thickening also appears to extend throughout the transverse colon and right colon. The appendix is not definitely involved. The terminal ileum is decompressed. Oral contrast was administered but has not yet reached the distal small bowel. Some proximal jejunal loops are mildly to moderately thickened (series 2, image 65). The duodenum is not definitely affected. Negative stomach. No abdominal free air or free fluid. Surgically absent gallbladder. Stable liver with several small circumscribed low-density areas which most resemble benign cysts. Negative spleen, pancreas and adrenal glands. Portal venous system  is patent. Aortoiliac calcified atherosclerosis noted. Major arterial structures are patent. Bilateral renal enhancement and contrast excretion is within normal limits. No lymphadenopathy identified. IMPRESSION: 1. Superior segment left lower lobe necrotizing pneumonia with small lung abscess (series 2, image 26). No associated left pleural effusion or thoracic lymphadenopathy. See also #4 regarding superimposed acute infection in the abdomen and pelvis. 2. Also at the site of a right lower lobe consolidation on the 2015 comparison there is a new 15 mm  cavitary lesion with a small fluid level suggesting abscess, as well as surrounding tree-in-bud nodularity indicating acute infection. 3. Superimposed 16 mm solid right lower lobe lung nodule. Recommend post treatment chest CT follow-up of this finding. 4. New diffuse large bowel wall thickening in the abdomen in keeping with acute colitis. No abdominal free abscess or complicating features identified. 5. Questionable infectious involvement of proximal small bowel loops. No evidence of obstruction. 6.  Calcified aortic atherosclerosis. Electronically Signed   By: Genevie Ann M.D.   On: 01/09/2016 13:09   Ct Abdomen Pelvis W Contrast  Result Date: 01/09/2016 CLINICAL DATA:  70 year old female non English speaker with cough, weakness, fatigue, chest pain. Initial encounter. EXAM: CT CHEST, ABDOMEN, AND PELVIS WITH CONTRAST TECHNIQUE: Multidetector CT imaging of the chest, abdomen and pelvis was performed following the standard protocol during bolus administration of intravenous contrast. CONTRAST:  12m ISOVUE-300 IOPAMIDOL (ISOVUE-300) INJECTION 61% COMPARISON:  Chest radiographs 0612 hours today. CT Abdomen and Pelvis 05/04/2014. FINDINGS: CT CHEST FINDINGS No pericardial effusion. Central pulmonary artery enlargement to the same caliber as the ascending aorta is noted. Major mediastinal vascular structures remain patent. Mild mostly noncalcified thoracic aortic  atherosclerosis. No hilar or mediastinal lymphadenopathy. Small calcified right peritracheal lymph nodes. No axillary lymphadenopathy. Major airways are patent. In the superior segment of the left lower lobe there is consolidation with surrounding peribronchial nodularity, but also a 13 mm air-fluid collection located within the consolidated lung (series 2, image 26). There is mild narrowing of adjacent airways. There is no associated left pleural effusion. On the 2015 comparison there was right lower lobe pneumonia. Right lower lobe ventilation has significantly improved since that time but there is a new mildly thick-walled 15 mm air-fluid collection in the right lower lobe on series 3, image 96 with surrounding tree-in-bud nodularity. There is also a a 15-16 mm new costophrenic angle pulmonary nodule on that side. Calcified granuloma right middle lobe. There is mild bronchiectasis and atelectasis in the right middle lobe. There is superimposed mild lung base atelectasis. No pleural effusion. No acute osseous abnormality identified. Low-density 2.3 cm left thyroid nodule. Additional smaller coarsely calcified and hypodense bilateral thyroid nodules. No thoracic inlet lymphadenopathy. CT ABDOMEN PELVIS FINDINGS No acute osseous abnormality identified. Mildly distended urinary bladder.  Negative uterus and adnexa. Indistinct rectum with wall thickening up to 9 mm. Small volume contained stool and fluid in the rectum. Similar appearance of the sigmoid colon which is decompressed. Wall thickening continues throughout the left colon, but is less apparent at the splenic flexure. Mild wall thickening also appears to extend throughout the transverse colon and right colon. The appendix is not definitely involved. The terminal ileum is decompressed. Oral contrast was administered but has not yet reached the distal small bowel. Some proximal jejunal loops are mildly to moderately thickened (series 2, image 65). The duodenum  is not definitely affected. Negative stomach. No abdominal free air or free fluid. Surgically absent gallbladder. Stable liver with several small circumscribed low-density areas which most resemble benign cysts. Negative spleen, pancreas and adrenal glands. Portal venous system is patent. Aortoiliac calcified atherosclerosis noted. Major arterial structures are patent. Bilateral renal enhancement and contrast excretion is within normal limits. No lymphadenopathy identified. IMPRESSION: 1. Superior segment left lower lobe necrotizing pneumonia with small lung abscess (series 2, image 26). No associated left pleural effusion or thoracic lymphadenopathy. See also #4 regarding superimposed acute infection in the abdomen and pelvis. 2. Also at the site of a right lower lobe  consolidation on the 2015 comparison there is a new 15 mm cavitary lesion with a small fluid level suggesting abscess, as well as surrounding tree-in-bud nodularity indicating acute infection. 3. Superimposed 16 mm solid right lower lobe lung nodule. Recommend post treatment chest CT follow-up of this finding. 4. New diffuse large bowel wall thickening in the abdomen in keeping with acute colitis. No abdominal free abscess or complicating features identified. 5. Questionable infectious involvement of proximal small bowel loops. No evidence of obstruction. 6.  Calcified aortic atherosclerosis. Electronically Signed   By: Genevie Ann M.D.   On: 01/09/2016 13:09   Ct Maxillofacial Wo Contrast  Result Date: 01/15/2016 CLINICAL DATA:  Lung abscess.  Question dental source. EXAM: CT MAXILLOFACIAL WITHOUT CONTRAST TECHNIQUE: Multidetector CT imaging of the maxillofacial structures was performed. Multiplanar CT image reconstructions were also generated. A small metallic BB was placed on the right temple in order to reliably differentiate right from left. COMPARISON:  None. FINDINGS: Patient is missing multiple upper and lower teeth. Lucency is seen around the  roots of the posterior most right upper tooth, appearing to represent tooth 5 (the first bicuspid). No other features to suggest definite periapical abscess involving other teeth. Chronic mucosal disease is seen in the sphenoid sinuses bilaterally. Maxillary sinuses are clear. Globes are symmetric in size and shape. Intra orbital fat is preserved bilaterally. IMPRESSION: Lucency around the right upper first bicuspid raises the question of periapical abscess. Electronically Signed   By: Misty Stanley M.D.   On: 01/15/2016 11:46    Micro Results    Recent Results (from the past 240 hour(s))  Blood culture (routine x 2)     Status: None   Collection Time: 01/09/16  1:45 PM  Result Value Ref Range Status   Specimen Description BLOOD RIGHT HAND  Final   Special Requests BOTTLES DRAWN AEROBIC AND ANAEROBIC 5CC  Final   Culture NO GROWTH 5 DAYS  Final   Report Status 01/14/2016 FINAL  Final  Blood culture (routine x 2)     Status: None   Collection Time: 01/09/16  1:50 PM  Result Value Ref Range Status   Specimen Description BLOOD LEFT HAND  Final   Special Requests BOTTLES DRAWN AEROBIC AND ANAEROBIC 5CC  Final   Culture NO GROWTH 5 DAYS  Final   Report Status 01/14/2016 FINAL  Final  Culture, bal-quantitative     Status: Abnormal   Collection Time: 01/10/16  2:23 PM  Result Value Ref Range Status   Specimen Description BRONCHIAL ALVEOLAR LAVAGE  Final   Special Requests LLL  Final   Gram Stain   Final    ABUNDANT WBC PRESENT,BOTH PMN AND MONONUCLEAR RARE GRAM POSITIVE COCCI IN CLUSTERS IN PAIRS RARE GRAM NEGATIVE RODS    Culture (A)  Final    >=100,000 COLONIES/mL Consistent with normal respiratory flora.   Report Status 01/13/2016 FINAL  Final  Fungus Culture With Stain     Status: None (Preliminary result)   Collection Time: 01/10/16  2:23 PM  Result Value Ref Range Status   Fungus Stain Final report  Final    Comment: (NOTE) Performed At: [facility] [place], [place] [id number] Lindon Romp MD [id number]    Fungus (Mycology) Culture PENDING  Incomplete   Fungal Source BRONCHIAL ALVEOLAR LAVAGE  Final    Comment: LLL  Acid Fast Smear (AFB)     Status: None   Collection Time: 01/10/16  2:23 PM  Result Value Ref  Range Status   AFB Specimen Processing Concentration  Final   Acid Fast Smear Negative  Final    Comment: (NOTE) Performed At: [facility] [place], [place] [id number] Lindon Romp MD [id number]    Source (AFB) BRONCHIAL ALVEOLAR LAVAGE  Final    Comment: LLL  Fungus Culture Result     Status: None   Collection Time: 01/10/16  2:23 PM  Result Value Ref Range Status   Result 1 Comment  Final    Comment: (NOTE) KOH/Calcofluor preparation:  no fungus observed. Performed At: [facility] [address] [place], [place] [id number] Lindon Romp MD AC:[id number]     Today   Subjective    Camilah Spillman today has no headache,no chest abdominal pain,no new weakness tingling or numbness, feels much better wants to go home today.     Objective   Blood pressure (!) 121/52, pulse 76, temperature 98.3 F (36.8 C), temperature source Oral, resp. rate 16, height _0  (1.575 m), weight 39.6 kg (87 lb 4.8 oz), SpO2 100 %.   Intake/Output Summary (Last 24 hours) at 01/16/16 0854 Last data filed at 01/16/16 0600  Gross per 24 hour  Intake          1037.33 ml  Output              626 ml  Net           411.33 ml    Exam Awake Alert, Oriented x 3, No new F.N deficits, Normal affect [place].AT,PERRAL Supple Neck,No JVD, No cervical lymphadenopathy appriciated.  Symmetrical Chest wall movement, Good air movement bilaterally, CTAB RRR,No Gallops,Rubs or new Murmurs, No Parasternal Heave +ve B.Sounds, Abd Soft, Non tender, No organomegaly appriciated, No rebound -guarding or rigidity. No Cyanosis, Clubbing or edema, No new Rash or bruise   Data Review   CBC w Diff:  Lab Results    Component Value Date   WBC 6.2 01/13/2016   HGB 8.2 (L) 01/13/2016   HCT 26.6 (L) 01/13/2016   PLT 124 (L) 01/13/2016   LYMPHOPCT 20 12/30/2015   MONOPCT 9 12/30/2015   EOSPCT 1 12/30/2015   BASOPCT 0 12/30/2015    CMP:  Lab Results  Component Value Date   NA 139 01/13/2016   K 3.6 01/13/2016   CL 106 01/13/2016   CO2 29 01/13/2016   BUN 13 01/13/2016   CREATININE 0.49 01/13/2016   CREATININE 0.50 12/30/2015   PROT 5.4 (L) 01/13/2016   ALBUMIN 2.5 (L) 01/13/2016   BILITOT 0.7 01/13/2016   ALKPHOS 54 01/13/2016   AST 17 01/13/2016   ALT 19 01/13/2016  .   Total Time in preparing paper work, data evaluation and todays exam - 35 minutes  Thurnell Lose M.D on 01/16/2016 at 8:54 AM  [facility]   Office  [phone]

## 2016-01-16 NOTE — Discharge Instructions (Signed)
Follow with Primary MD Pete Glatterawn T Langeland, MD in 7 days   Get CBC, CMP, 2 view Chest X ray checked  by Primary MD or SNF MD in 5-7 days ( we routinely change or add medications that can affect your baseline labs and fluid status, therefore we recommend that you get the mentioned basic workup next visit with your PCP, your PCP may decide not to get them or add new tests based on their clinical decision)   Activity: As tolerated with Full fall precautions use walker/cane & assistance as needed   Disposition Home **   Diet:   Heart Healthy Low Carb.  Accuchecks 4 times/day, Once in AM empty stomach and then before each meal. Log in all results and show them to your Prim.MD in 3 days. If any glucose reading is under 80 or above 300 call your Prim MD immidiately. Follow Low glucose instructions for glucose under 80 as instructed.  For Heart failure patients - Check your Weight same time everyday, if you gain over 2 pounds, or you develop in leg swelling, experience more shortness of breath or chest pain, call your Primary MD immediately. Follow Cardiac Low Salt Diet and 1.5 lit/day fluid restriction.   On your next visit with your primary care physician please Get Medicines reviewed and adjusted.   Please request your Prim.MD to go over all Hospital Tests and Procedure/Radiological results at the follow up, please get all Hospital records sent to your Prim MD by signing hospital release before you go home.   If you experience worsening of your admission symptoms, develop shortness of breath, life threatening emergency, suicidal or homicidal thoughts you must seek medical attention immediately by calling 911 or calling your MD immediately  if symptoms less severe.  You Must read complete instructions/literature along with all the possible adverse reactions/side effects for all the Medicines you take and that have been prescribed to you. Take any new Medicines after you have completely understood  and accpet all the possible adverse reactions/side effects.   Do not drive, operate heavy machinery, perform activities at heights, swimming or participation in water activities or provide baby sitting services if your were admitted for syncope or siezures until you have seen by Primary MD or a Neurologist and advised to do so again.  Do not drive when taking Pain medications.    Do not take more than prescribed Pain, Sleep and Anxiety Medications  Special Instructions: If you have smoked or chewed Tobacco  in the last 2 yrs please stop smoking, stop any regular Alcohol  and or any Recreational drug use.  Wear Seat belts while driving.   Please note  You were cared for by a hospitalist during your hospital stay. If you have any questions about your discharge medications or the care you received while you were in the hospital after you are discharged, you can call the unit and asked to speak with the hospitalist on call if the hospitalist that took care of you is not available. Once you are discharged, your primary care physician will handle any further medical issues. Please note that NO REFILLS for any discharge medications will be authorized once you are discharged, as it is imperative that you return to your primary care physician (or establish a relationship with a primary care physician if you do not have one) for your aftercare needs so that they can reassess your need for medications and monitor your lab values.

## 2016-01-16 NOTE — Progress Notes (Signed)
Occupational Therapy Treatment Patient Details Name: Yehuda BuddMoihh Higham MRN: [id number] DOB: 11/28/1945 Today's Date: 01/16/2016    History of present illness 70 yo female with onset of weight loss and weakness was admitted with Necrotizing PNA w/ Multiple cavitary lung nodules, has lung abscesses and history of DM, neuropathy, HTN and possible memory changes   OT comments  Pt progressing towards acute OT goals. Focus of session was fall prevention education, safety with shower transfers. Pt reports she has a shower seat now. Completed household distance functional mobility using cane at supervision level. D/c plan remains appropriate.   Follow Up Recommendations  No OT follow up;Supervision/Assistance - 24 hour    Equipment Recommendations  None recommended by OT    Recommendations for Other Services      Precautions / Restrictions Precautions Precautions: Fall Restrictions Weight Bearing Restrictions: No       Mobility Bed Mobility Overal bed mobility: Modified Independent             General bed mobility comments: moving slowly, but independently  Transfers Overall transfer level: Needs assistance Equipment used: Straight cane Transfers: Sit to/from Stand Sit to Stand: Min guard         General transfer comment: relies on UE to push to standing. SPC used.     Balance Overall balance assessment: Needs assistance Sitting-balance support: Feet supported Sitting balance-Leahy Scale: Good     Standing balance support: Single extremity supported Standing balance-Leahy Scale: Fair                     ADL Overall ADL's : Needs assistance/impaired                                       General ADL Comments: Pt completed bed mobility and household distance functional mobility using SPC at supervision level. Pt reported feeling fine throughout session. Educated on fall prevention. Pt reports she has a shower seat at home. Son present during  session.      Vision                     Perception     Praxis      Cognition   Behavior During Therapy: Flat affect Overall Cognitive Status: Within Functional Limits for tasks assessed                       Extremity/Trunk Assessment               Exercises     Shoulder Instructions       General Comments      Pertinent Vitals/ Pain       Pain Assessment: No/denies pain  Home Living                                          Prior Functioning/Environment              Frequency Min 2X/week     Progress Toward Goals  OT Goals(current goals can now be found in the care plan section)  Progress towards OT goals: Progressing toward goals  Acute Rehab OT Goals Patient Stated Goal: Hopes to get home OT Goal Formulation: With patient Time For Goal Achievement: 01/27/16 Potential to Achieve Goals: Good ADL Goals  Pt Will Perform Tub/Shower Transfer: Tub transfer;with supervision;ambulating;shower seat;with caregiver independent in assisting Additional ADL Goal #1: Family will verbalize 3 strategies to reduce risk of falls at home  Plan Discharge plan remains appropriate    Co-evaluation                 End of Session Equipment Utilized During Treatment: Other (comment) [facility]   Activity Tolerance Patient tolerated treatment well   Patient Left in bed;with call bell/phone within reach;with family/visitor present   Nurse Communication          Time: [id number] OT Time Calculation (min): 12 min  Charges: OT General Charges $OT Visit: 1 Procedure OT Treatments $Self Care/Home Management : 8-22 mins  Pilar GrammesMathews, Sanye Ledesma H 01/16/2016, 10:57 AM

## 2016-01-17 ENCOUNTER — Encounter (HOSPITAL_COMMUNITY): Payer: Self-pay | Admitting: Vascular Surgery

## 2016-01-17 ENCOUNTER — Emergency Department (HOSPITAL_COMMUNITY): Payer: Medicare Other

## 2016-01-17 DIAGNOSIS — Z794 Long term (current) use of insulin: Secondary | ICD-10-CM | POA: Diagnosis not present

## 2016-01-17 DIAGNOSIS — M79671 Pain in right foot: Secondary | ICD-10-CM | POA: Diagnosis present

## 2016-01-17 DIAGNOSIS — I10 Essential (primary) hypertension: Secondary | ICD-10-CM | POA: Diagnosis not present

## 2016-01-17 DIAGNOSIS — E1141 Type 2 diabetes mellitus with diabetic mononeuropathy: Secondary | ICD-10-CM | POA: Diagnosis not present

## 2016-01-17 DIAGNOSIS — Z7984 Long term (current) use of oral hypoglycemic drugs: Secondary | ICD-10-CM | POA: Insufficient documentation

## 2016-01-17 DIAGNOSIS — E1140 Type 2 diabetes mellitus with diabetic neuropathy, unspecified: Secondary | ICD-10-CM | POA: Diagnosis not present

## 2016-01-17 LAB — CBC
HEMATOCRIT: 28.1 % — AB (ref 36.0–46.0)
HEMOGLOBIN: 8.8 g/dL — AB (ref 12.0–15.0)
MCH: 20.3 pg — ABNORMAL LOW (ref 26.0–34.0)
MCHC: 31.3 g/dL (ref 30.0–36.0)
MCV: 64.7 fL — ABNORMAL LOW (ref 78.0–100.0)
Platelets: 174 10*3/uL (ref 150–400)
RBC: 4.34 MIL/uL (ref 3.87–5.11)
RDW: 16.2 % — ABNORMAL HIGH (ref 11.5–15.5)
WBC: 4.9 10*3/uL (ref 4.0–10.5)

## 2016-01-17 LAB — I-STAT TROPONIN, ED: TROPONIN I, POC: 0 ng/mL (ref 0.00–0.08)

## 2016-01-17 NOTE — ED Triage Notes (Signed)
Pt reports to the ED for eval of bilateral foot pain. Denies any injury. Pt was seen here for the same a few days ago and was admitted for the same. Pt also reports some left sided CP and SOB. Had these symptoms on the last admission and they have remained consistent

## 2016-01-18 ENCOUNTER — Emergency Department (HOSPITAL_COMMUNITY)
Admission: EM | Admit: 2016-01-18 | Discharge: 2016-01-18 | Disposition: A | Discharge status: Home / Self Care | Payer: Medicare Other | Attending: Emergency Medicine | Admitting: Emergency Medicine

## 2016-01-18 DIAGNOSIS — E1141 Type 2 diabetes mellitus with diabetic mononeuropathy: Secondary | ICD-10-CM

## 2016-01-18 LAB — BASIC METABOLIC PANEL
Anion gap: 5 (ref 5–15)
BUN: 27 mg/dL — AB (ref 6–20)
CALCIUM: 9.4 mg/dL (ref 8.9–10.3)
CO2: 29 mmol/L (ref 22–32)
CREATININE: 0.54 mg/dL (ref 0.44–1.00)
Chloride: 101 mmol/L (ref 101–111)
GFR calc Af Amer: >60 mL/min (ref >60)
GLUCOSE: 186 mg/dL — AB (ref 65–99)
Potassium: 4 mmol/L (ref 3.5–5.1)
Sodium: 135 mmol/L (ref 135–145)

## 2016-01-18 NOTE — Discharge Instructions (Signed)
Continue taking your gabapentin.  This was the medicine I was going to start you on.

## 2016-01-18 NOTE — ED Provider Notes (Signed)
[facility] DEPT Provider Note   CSN: [id number] Arrival date & time: 01/17/16  2251  By signing my name below, I, Irene Pap, attest that this documentation has been prepared under the direction and in the presence of Deno Etienne, DO. Electronically Signed: Irene Pap, ED Scribe. 01/18/16. 4:50 AM.  History   Chief Complaint Chief Complaint  Patient presents with  . Foot Pain   The history is provided by a relative. No language interpreter was used.  Foot Pain  This is a chronic problem. The current episode started more than 2 days ago. The problem occurs constantly. The problem has been gradually worsening. Pertinent negatives include no chest pain, no headaches and no shortness of breath. She has tried nothing for the symptoms.   HPI Comments: Kaci Dillie is a [name] with a hx of DM, GERD, and HTN who presents to the Emergency Department complaining of ongoing bilateral leg pain. Pt was seen days ago for the same and admitted. Pt's symptoms have stayed consistent since last admission. Pt is mostly concerned with the bilateral leg pain that has been ongoing for 8 years. She denies other symptoms. Pt is translated by a relative.   Past Medical History:  Diagnosis Date  . Arthritis   . Diabetes mellitus without complication ([place])    stopped DM meds 2004  . GERD (gastroesophageal reflux disease)   . Hypertension    meds stopped 1 week ago , need to get renewed    Patient Active Problem List   Diagnosis Date Noted  . Pneumonia   . Chest pain   . Abnormal CT of the chest   . Lung abscess ([place]) 01/09/2016  . Protein-calorie malnutrition, severe 12/16/2015  . Generalized weakness 12/15/2015  . Hyponatremia 12/15/2015  . Anemia 12/15/2015  . Hyperglycemia 12/15/2015  . Nausea without vomiting 12/15/2015  . Hypertension   . GERD (gastroesophageal reflux disease)   . Diabetes mellitus with neurological manifestations [facility])     Past Surgical History:  Procedure  Laterality Date  . CHOLECYSTECTOMY N/A 05/02/2014   Procedure: LAPAROSCOPIC CHOLECYSTECTOMY ;  Surgeon: Coralie Keens, MD;  Location: [facility];  Service: General;  Laterality: N/A;  laparoscopic cholecystectomy  . COLONOSCOPY    . NO PAST SURGERIES    . VIDEO BRONCHOSCOPY Bilateral 01/10/2016   Procedure: VIDEO BRONCHOSCOPY WITHOUT FLUORO;  Surgeon: Collene Gobble, MD;  Location: [facility];  Service: Cardiopulmonary;  Laterality: Bilateral;    OB History    No data available       Home Medications    Prior to Admission medications   Medication Sig Start Date End Date Taking? Authorizing Provider  amoxicillin-clavulanate (AUGMENTIN) 875-125 MG tablet Take 1 tablet by mouth every 12 (twelve) hours. 01/16/16 03/05/16 Yes Thurnell Lose, MD  feeding supplement, GLUCERNA SHAKE, (GLUCERNA SHAKE) LIQD Take 237 mLs by mouth 2 (two) times daily between meals. 12/16/15  Yes Albertine Patricia, MD  Ferrous Gluconate 324 (37.5 Fe) MG TABS Take 1 tablet (324 mg total) by mouth daily. 12/30/15  Yes Maren Reamer, MD  gabapentin (NEURONTIN) 300 MG capsule Take 2 capsules (600 mg total) by mouth 3 (three) times daily. 12/30/15  Yes Maren Reamer, MD  Insulin Glargine (LANTUS SOLOSTAR) 100 UNIT/ML Solostar Pen Inject 20 Units into the skin daily at 10 pm. 12/30/15  Yes Maren Reamer, MD  metFORMIN (GLUCOPHAGE) 1000 MG tablet Take 1 tablet (1,000 mg total) by mouth 2 (two) times daily with a meal. 12/30/15  Yes Maren Reamer, MD  saccharomyces boulardii (FLORASTOR) 250 MG capsule Take 1 capsule (250 mg total) by mouth 2 (two) times daily. Any probiotic that is covered 01/16/16  Yes Thurnell Lose, MD  Vitamin D, Ergocalciferol, (DRISDOL) 50000 units CAPS capsule Take 1 capsule (50,000 Units total) by mouth every 7 (seven) days. 12/31/15  Yes Maren Reamer, MD  blood glucose meter kit and supplies KIT Dispense based on patient and insurance preference. Use up to four times daily as directed. (FOR  ICD-9 250.00, 250.01). 12/16/15   Silver Huguenin Elgergawy, MD  glucose blood test strip Use as instructed 12/30/15   Maren Reamer, MD  Insulin Pen Needle (ULTICARE MICRO PEN NEEDLES) 32G X 4 MM MISC 1 applicator by Does not apply route at bedtime. 12/30/15   Maren Reamer, MD  Lancet Devices [facility]) lancets Use as instructed 12/30/15   Maren Reamer, MD    Family History No family history on file.  Social History Social History  Substance Use Topics  . Smoking status: Never Smoker  . Smokeless tobacco: Never Used  . Alcohol use No     Allergies   Review of patient's allergies indicates no known allergies.   Review of Systems Review of Systems  Constitutional: Negative for chills and fever.  HENT: Negative for congestion and rhinorrhea.   Eyes: Negative for redness and visual disturbance.  Respiratory: Negative for shortness of breath and wheezing.   Cardiovascular: Negative for chest pain and palpitations.  Gastrointestinal: Negative for nausea and vomiting.  Genitourinary: Negative for dysuria and urgency.  Musculoskeletal: Positive for arthralgias. Negative for myalgias.  Skin: Negative for pallor and wound.  Neurological: Negative for dizziness and headaches.     Physical Exam Updated Vital Signs BP 157/89 (BP Location: Right Arm)   Pulse 76   Temp 98.2 F (36.8 C) (Oral)   Resp 18   SpO2 99%   Physical Exam  Constitutional: She is oriented to person, place, and time. She appears cachectic. No distress.  HENT:  Head: Normocephalic and atraumatic.  Eyes: EOM are normal. Pupils are equal, round, and reactive to light.  Neck: Normal range of motion. Neck supple.  Cardiovascular: Normal rate and regular rhythm.  Exam reveals no gallop and no friction rub.   No murmur heard. Pulmonary/Chest: Effort normal. She has no wheezes. She has no rales.  Abdominal: Soft. She exhibits no distension. There is no tenderness.  Musculoskeletal: She exhibits no edema  or tenderness.  Pulses 2+ and equal; no signs of injury to either leg; motor and reflexes intact  Neurological: She is alert and oriented to person, place, and time.  Skin: Skin is warm and dry. She is not diaphoretic.  Psychiatric: She has a normal mood and affect. Her behavior is normal.  Nursing note and vitals reviewed.    ED Treatments / Results  DIAGNOSTIC STUDIES: Oxygen Saturation is 100% on RA, normal by my interpretation.    COORDINATION OF CARE: 2:38 AM-Discussed treatment plan which includes labs with pt at bedside and pt agreed to plan.    Labs (all labs ordered are listed, but only abnormal results are displayed) Labs Reviewed  BASIC METABOLIC PANEL - Abnormal; Notable for the following:       Result Value   Glucose, Bld 186 (*)    BUN 27 (*)    All other components within normal limits  CBC - Abnormal; Notable for the following:    Hemoglobin 8.8 (*)  HCT 28.1 (*)    MCV 64.7 (*)    MCH 20.3 (*)    RDW 16.2 (*)    All other components within normal limits  I-STAT TROPOININ, ED    EKG  EKG Interpretation None       Radiology Dg Chest 2 View  Result Date: 01/18/2016 CLINICAL DATA:  Bilateral foot pain. Known injuries. Left-sided chest pain and shortness of breath. EXAM: CHEST  2 VIEW COMPARISON:  CT and chest radiographs 01/09/2016. FINDINGS: Cavitary lung nodules again demonstrated in the right lung base without interval change. CT 01/09/2016 suggested probable inflammatory lesions. Peribronchial thickening and streaky perihilar opacities suggests small airways disease. No blunting of costophrenic angles. No pneumothorax. Mediastinal contours appear intact. Normal heart size and pulmonary vascularity. IMPRESSION: Cavitary nodules in the right lung base are unchanged since previous study. CT suggested probable inflammatory process. Peribronchial thickening and perihilar opacities suggest airways disease. Electronically Signed   By: Lucienne Capers M.D.    On: 01/18/2016 00:00    Procedures Procedures (including critical care time)  Medications Ordered in ED Medications - No data to display   Initial Impression / Assessment and Plan / ED Course  I have reviewed the triage vital signs and the nursing notes.  Pertinent labs & imaging results that were available during my care of the patient were reviewed by me and considered in my medical decision making (see chart for details).  Clinical Course    70 yo F a cc of leg pain.  Going on for 7 years, bilateral, burning pain.  Denies any current chest issues as she was just in the hospital with multiple lung abscesses.  Suspect DM neuropathy.  Already on gabapentin.  Recommended PCP follow up.   4:50 AM:  I have discussed the diagnosis/risks/treatment options with the patient and family and believe the pt to be eligible for discharge home to follow-up with PCP. We also discussed returning to the ED immediately if new or worsening sx occur. We discussed the sx which are most concerning (e.g., sudden worsening pain, fever, inability to tolerate by mouth) that necessitate immediate return. Medications administered to the patient during their visit and any new prescriptions provided to the patient are listed below.  Medications given during this visit Medications - No data to display   The patient appears reasonably screen and/or stabilized for discharge and I doubt any other medical condition or other [facility] requiring further screening, evaluation, or treatment in the ED at this time prior to discharge.    Final Clinical Impressions(s) / ED Diagnoses   Final diagnoses:  Diabetic mononeuropathy associated with type 2 diabetes mellitus ([place])   I personally performed the services described in this documentation, which was scribed in my presence. The recorded information has been reviewed and is accurate.   New Prescriptions Discharge Medication List as of 01/18/2016  3:13 AM       Deno Etienne,  DO 01/18/16 3009

## 2016-01-22 NOTE — Progress Notes (Signed)
OT Note - Addenedum    01/13/16 1505  OT Visit Information  Last OT Received On 01/13/16  OT G-codes **NOT FOR INPATIENT CLASS**  Functional Assessment Tool Used clinical judgement  Functional Limitation Self care  Self Care Current Status [phone](G8987) CI  Self Care Goal Status ([phone](G8988) CI  [facility] Leelynd Maldonado, OTR/L  [phone] 01/22/2016

## 2016-01-23 ENCOUNTER — Telehealth: Payer: Self-pay | Admitting: Internal Medicine

## 2016-01-23 NOTE — Telephone Encounter (Signed)
Jeanice LimHolly, nurse from [facility], called office to speak with nurse regarding pt's medication, updates and order. Please follow up.   Thank you

## 2016-01-24 ENCOUNTER — Telehealth: Payer: Self-pay | Admitting: Internal Medicine

## 2016-01-24 NOTE — Telephone Encounter (Signed)
Contacted Holly back from [facility] no answer lvm making aware she can give me a call at the office at her earliest convenience

## 2016-01-24 NOTE — Telephone Encounter (Signed)
[place] and gave verbal order per Dr. Julien NordmannLangeland

## 2016-01-24 NOTE — Telephone Encounter (Signed)
Arline Aspindy, from [facility], called the office to speak with PCP to request a verbal order for a Social Worker to go out and help patient with transportation, medication and insurance. Please follow up. [phone]).  Thank you

## 2016-01-28 NOTE — Telephone Encounter (Signed)
Thanks for update

## 2016-01-30 NOTE — Progress Notes (Signed)
Late entry for missed G-code. Based on review of the evaluation and goals by Samul Dadauth Stout, PT.   01/11/16 1301  PT G-Codes **NOT FOR INPATIENT CLASS**  Functional Assessment Tool Used clinical judgement based on review of medical record  Functional Limitation Mobility: Walking and moving around  Mobility: Walking and Moving Around Current Status ([phone](G8978) CJ  Mobility: Walking and Moving Around Goal Status [phone](G8979) CI  Lavona MoundMark Alexzia Kasler, PT  [phone] 01/30/2016

## 2016-01-31 NOTE — Progress Notes (Signed)
SLP Note (late entry):    01/15/16 1500  SLP G-Codes **NOT FOR INPATIENT CLASS**  Functional Assessment Tool Used skilled clinical judgment  Functional Limitations Swallowing  Swallow Current Status ([id number](G8996) CI  Swallow Goal Status (U0454(G8997) CI  Swallow Discharge Status (U9811(G8998) CI  SLP Evaluations  $ SLP Speech Visit 1 Procedure  SLP Evaluations  $BSS Swallow 1 Procedure   Maxcine HamLaura Paiewonsky, M.A. CCC-SLP [phone]

## 2016-02-04 ENCOUNTER — Telehealth: Payer: Self-pay | Admitting: Adult Health

## 2016-02-04 ENCOUNTER — Telehealth: Payer: Self-pay | Admitting: Emergency Medicine

## 2016-02-04 ENCOUNTER — Other Ambulatory Visit: Payer: Self-pay | Admitting: Internal Medicine

## 2016-02-04 DIAGNOSIS — A159 Respiratory tuberculosis unspecified: Secondary | ICD-10-CM

## 2016-02-04 NOTE — Telephone Encounter (Signed)
[facility] Pt had an AFB culture done. 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 Olivia NovemberShae states that this of course is not the final report but per their policy they have to call us. Pt has an upcoming HFU with RB on 03/05/16.  TP - please advise as RB is unavailable. Thanks.

## 2016-02-04 NOTE — Telephone Encounter (Signed)
Call from Dr. 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  She will call pt to report.  Health Dept , Madisun Hargrove RN was called and voice mail left to return call for information .  Pt will need to begin isolation and drug therapy per HD TB protocol.

## 2016-02-04 NOTE — Telephone Encounter (Signed)
TP already aware of call report.  Olivia Glatterawn T Langeland, MD   02/04/16 4:44 PM  Note    [facility] Appreciate all help.  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 Will hold off on clinic followup for now until treatment initiated and cleared by Health Dept. Canceled ID referral as well.     Olivia Sicksammy S Parrett, NP   02/04/16 4:43 PM  Note    Call from Dr. 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  She will call pt to report.  Health Dept , Tammy RN was called and voice mail left to return call for information .  Pt will need to begin isolation and drug therapy per HD TB protocol.

## 2016-02-04 NOTE — Telephone Encounter (Signed)
[facility] Appreciate all help.  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 Will hold off on clinic followup for now until treatment initiated and cleared by Health Dept. Canceled ID referral as well.

## 2016-02-05 ENCOUNTER — Telehealth: Payer: Self-pay | Admitting: Internal Medicine

## 2016-02-05 ENCOUNTER — Telehealth: Payer: Self-pay

## 2016-02-05 ENCOUNTER — Telehealth: Payer: Self-pay | Admitting: Adult Health

## 2016-02-05 NOTE — Telephone Encounter (Signed)
Contacted pt to go over lab results pt is aware and doesn't have any questions or concerns 

## 2016-02-05 NOTE — Telephone Encounter (Signed)
Received call from [name] with [facility] who asked if we had spoken with patient about her positive TB results  Per conversation TP had with Eber Jonesarolyn yesterday 9.12.17 @ [facility], they were to call patient  [name] reported she has tried patient on the 2 contact numbers they have for pt and have been unable to reach her  Emergency contact information provided to [facility] for pt's son Laveda Normanran, whom accompanied pt to her last appt with PCP (in Epic).  Also advised [name] that per that ov note, pt had an interpreter present at the visit and pt's primary language given to [facility] as well.  Nothing further needed; will sign off.

## 2016-02-05 NOTE — Telephone Encounter (Signed)
Nurse is requesting last office visit notes and medication list..... Please fax to [phone] [facility]

## 2016-02-05 NOTE — Telephone Encounter (Signed)
[facility] Carolyn Chaffin- [phone]  Notified of positive results of M . TB complex.  Hospital records faxed.  She will contact pt and begin TB protocol.  02/05/2016 0935am

## 2016-02-11 LAB — FUNGUS CULTURE RESULT

## 2016-02-11 LAB — FUNGUS CULTURE WITH STAIN

## 2016-02-11 LAB — FUNGAL ORGANISM REFLEX

## 2016-02-13 ENCOUNTER — Telehealth: Payer: Self-pay | Admitting: Emergency Medicine

## 2016-02-13 NOTE — Telephone Encounter (Addendum)
[facility] She was needing to speak to us about the pt's positive TB smears. Pt was started on a regimen of medications on 02/06/16. Pt is taking Isoniazid 300mg  daily, Rifampin 450mg  daily, Ethambutol 800mg  daily, Tryrazinamide 1000mg  daily and Vitamin B6 25mg  daily. [name] wanted to know if pt had any contact with any of us in the office Advised her that she has not since we have not seen her in the office yet. [name] is going to call us when it gets closer to the pt's appointment in October and let us know if it will be safe for the pt and all of us for her to come to this appointment. Advised [name] that we will more than likely have the pt to put on a mask and our staff will wear a masks as well as a precaution. [name] will stay in contact with us to keep us updated as things progress and change with the pt's diagnosis.  We will keep this message open to follow up on as it gets closer to the pt's appointment.

## 2016-02-14 NOTE — Telephone Encounter (Signed)
e

## 2016-02-17 NOTE — Telephone Encounter (Signed)
Reviewed, thanks!

## 2016-02-25 ENCOUNTER — Encounter (HOSPITAL_COMMUNITY): Payer: Self-pay | Admitting: Emergency Medicine

## 2016-02-25 ENCOUNTER — Emergency Department (HOSPITAL_COMMUNITY): Payer: Medicare Other

## 2016-02-25 ENCOUNTER — Emergency Department (HOSPITAL_COMMUNITY)
Admission: EM | Admit: 2016-02-25 | Discharge: 2016-02-25 | Disposition: A | Discharge status: Home / Self Care | Payer: Medicare Other | Attending: Emergency Medicine | Admitting: Emergency Medicine

## 2016-02-25 DIAGNOSIS — R079 Chest pain, unspecified: Secondary | ICD-10-CM | POA: Insufficient documentation

## 2016-02-25 DIAGNOSIS — Z7984 Long term (current) use of oral hypoglycemic drugs: Secondary | ICD-10-CM | POA: Insufficient documentation

## 2016-02-25 DIAGNOSIS — Z79899 Other long term (current) drug therapy: Secondary | ICD-10-CM | POA: Insufficient documentation

## 2016-02-25 DIAGNOSIS — M5430 Sciatica, unspecified side: Secondary | ICD-10-CM

## 2016-02-25 DIAGNOSIS — E1149 Type 2 diabetes mellitus with other diabetic neurological complication: Secondary | ICD-10-CM | POA: Insufficient documentation

## 2016-02-25 DIAGNOSIS — Z794 Long term (current) use of insulin: Secondary | ICD-10-CM | POA: Diagnosis not present

## 2016-02-25 DIAGNOSIS — I10 Essential (primary) hypertension: Secondary | ICD-10-CM | POA: Diagnosis not present

## 2016-02-25 DIAGNOSIS — N3289 Other specified disorders of bladder: Secondary | ICD-10-CM | POA: Diagnosis not present

## 2016-02-25 DIAGNOSIS — M5441 Lumbago with sciatica, right side: Secondary | ICD-10-CM | POA: Diagnosis not present

## 2016-02-25 DIAGNOSIS — M545 Low back pain: Secondary | ICD-10-CM | POA: Diagnosis present

## 2016-02-25 HISTORY — DX: Respiratory tuberculosis unspecified: A15.9

## 2016-02-25 LAB — COMPREHENSIVE METABOLIC PANEL
ALBUMIN: 2.8 g/dL — AB (ref 3.5–5.0)
ALT: 25 U/L (ref 14–54)
AST: 23 U/L (ref 15–41)
Alkaline Phosphatase: 77 U/L (ref 38–126)
Anion gap: 6 (ref 5–15)
BILIRUBIN TOTAL: 0.5 mg/dL (ref 0.3–1.2)
BUN: 17 mg/dL (ref 6–20)
CHLORIDE: 103 mmol/L (ref 101–111)
CO2: 31 mmol/L (ref 22–32)
CREATININE: 0.58 mg/dL (ref 0.44–1.00)
Calcium: 8.6 mg/dL — ABNORMAL LOW (ref 8.9–10.3)
GFR calc Af Amer: >60 mL/min (ref >60)
GLUCOSE: 311 mg/dL — AB (ref 65–99)
POTASSIUM: 4.3 mmol/L (ref 3.5–5.1)
Sodium: 140 mmol/L (ref 135–145)
TOTAL PROTEIN: 5.6 g/dL — AB (ref 6.5–8.1)

## 2016-02-25 LAB — CBC WITH DIFFERENTIAL/PLATELET
BASOS PCT: 0 %
Basophils Absolute: 0 10*3/uL (ref 0.0–0.1)
EOS PCT: 3 %
Eosinophils Absolute: 0.1 10*3/uL (ref 0.0–0.7)
HEMATOCRIT: 28.1 % — AB (ref 36.0–46.0)
Hemoglobin: 9 g/dL — ABNORMAL LOW (ref 12.0–15.0)
LYMPHS ABS: 1 10*3/uL (ref 0.7–4.0)
Lymphocytes Relative: 26 %
MCH: 21.8 pg — ABNORMAL LOW (ref 26.0–34.0)
MCHC: 32 g/dL (ref 30.0–36.0)
MCV: 68.2 fL — AB (ref 78.0–100.0)
MONOS PCT: 6 %
Monocytes Absolute: 0.2 10*3/uL (ref 0.1–1.0)
NEUTROS ABS: 2.5 10*3/uL (ref 1.7–7.7)
Neutrophils Relative %: 65 %
Platelets: 99 10*3/uL — ABNORMAL LOW (ref 150–400)
RBC: 4.12 MIL/uL (ref 3.87–5.11)
RDW: 17.7 % — AB (ref 11.5–15.5)
WBC: 3.8 10*3/uL — ABNORMAL LOW (ref 4.0–10.5)

## 2016-02-25 LAB — URINALYSIS, ROUTINE W REFLEX MICROSCOPIC
Bilirubin Urine: NEGATIVE
Glucose, UA: >1000 mg/dL — AB
Ketones, ur: NEGATIVE mg/dL
LEUKOCYTES UA: NEGATIVE
Nitrite: NEGATIVE
PROTEIN: 30 mg/dL — AB
SPECIFIC GRAVITY, URINE: 1.02 (ref 1.005–1.030)
pH: 7 (ref 5.0–8.0)

## 2016-02-25 LAB — CBG MONITORING, ED: GLUCOSE-CAPILLARY: 332 mg/dL — AB (ref 65–99)

## 2016-02-25 LAB — I-STAT TROPONIN, ED: Troponin i, poc: 0 ng/mL (ref 0.00–0.08)

## 2016-02-25 LAB — URINE MICROSCOPIC-ADD ON
BACTERIA UA: NONE SEEN
WBC, UA: NONE SEEN WBC/hpf (ref 0–5)

## 2016-02-25 LAB — LIPASE, BLOOD: Lipase: 34 U/L (ref 11–51)

## 2016-02-25 MED ORDER — IOPAMIDOL (ISOVUE-300) INJECTION 61%
INTRAVENOUS | Status: AC
  Administered 2016-02-25: 100 mL
  Filled 2016-02-25: qty 100

## 2016-02-25 MED ORDER — MORPHINE SULFATE (PF) 2 MG/ML IV SOLN
2.0000 mg | Freq: Once | INTRAVENOUS | Status: AC
  Administered 2016-02-25: 2 mg via INTRAVENOUS
  Filled 2016-02-25: qty 1

## 2016-02-25 MED ORDER — SODIUM CHLORIDE 0.9 % IV BOLUS (SEPSIS)
500.0000 mL | Freq: Once | INTRAVENOUS | Status: AC
  Administered 2016-02-25: 500 mL via INTRAVENOUS

## 2016-02-25 MED ORDER — TRAMADOL HCL 50 MG PO TABS: 50.0000 mg | ORAL_TABLET | Freq: Four times a day (QID) | ORAL | 0 refills | Status: DC | PRN

## 2016-02-25 NOTE — ED Notes (Signed)
Pt was on the waiting room with a face mask the entire time PT get a room on the ED she is on a negative pressure room and airborne precaution, per infections dz pt still need to be on treatment for active TB, per family pt finished her treatment.

## 2016-02-25 NOTE — ED Triage Notes (Signed)
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 Pt wearing mask. Pt has completed her medications per family. Pt states she has a cough occasionally but denies night sweats

## 2016-02-25 NOTE — Discharge Instructions (Signed)
Follow up with your md next week for recheck °

## 2016-02-25 NOTE — ED Provider Notes (Signed)
[place] DEPT Provider Note   CSN: [id number] Arrival date & time: 02/25/16  1400     History   Chief Complaint Chief Complaint  Patient presents with  . Leg Pain    HPI Nitisha Civello is a 70 y.o. female.  Family states patient has been having back pain radiating around both legs. The pain is getting worse    Back Pain   This is a new problem. The current episode started more than 2 days ago. The problem occurs constantly. The problem has not changed since onset.The pain is present in the thoracic spine and lumbar spine. The quality of the pain is described as shooting. The pain radiates to the right thigh and left thigh. The pain is at a severity of 5/10. The pain is moderate. The symptoms are aggravated by bending. The pain is the same all the time. Pertinent negatives include no chest pain, no headaches and no abdominal pain.    Past Medical History:  Diagnosis Date  . Arthritis   . Diabetes mellitus without complication ([place])    stopped DM meds 2004  . GERD (gastroesophageal reflux disease)   . Hypertension    meds stopped 1 week ago , need to get renewed  . Tuberculosis    ACTIVE    Patient Active Problem List   Diagnosis Date Noted  . Pneumonia   . Chest pain   . Abnormal CT of the chest   . Lung abscess ([place]) 01/09/2016  . Protein-calorie malnutrition, severe 12/16/2015  . Generalized weakness 12/15/2015  . Hyponatremia 12/15/2015  . Anemia 12/15/2015  . Hyperglycemia 12/15/2015  . Nausea without vomiting 12/15/2015  . Hypertension   . GERD (gastroesophageal reflux disease)   . Diabetes mellitus with neurological manifestations [facility])     Past Surgical History:  Procedure Laterality Date  . CHOLECYSTECTOMY N/A 05/02/2014   Procedure: LAPAROSCOPIC CHOLECYSTECTOMY ;  Surgeon: Coralie Keens, MD;  Location: [facility];  Service: General;  Laterality: N/A;  laparoscopic cholecystectomy  . COLONOSCOPY    . NO PAST SURGERIES    . VIDEO BRONCHOSCOPY Bilateral  01/10/2016   Procedure: VIDEO BRONCHOSCOPY WITHOUT FLUORO;  Surgeon: Collene Gobble, MD;  Location: [facility];  Service: Cardiopulmonary;  Laterality: Bilateral;    OB History    No data available       Home Medications    Prior to Admission medications   Medication Sig Start Date End Date Taking? Authorizing Provider  amoxicillin-clavulanate (AUGMENTIN) 875-125 MG tablet Take 1 tablet by mouth every 12 (twelve) hours. 01/16/16 03/05/16  Thurnell Lose, MD  blood glucose meter kit and supplies KIT Dispense based on patient and insurance preference. Use up to four times daily as directed. (FOR ICD-9 250.00, 250.01). 12/16/15   Albertine Patricia, MD  feeding supplement, GLUCERNA SHAKE, (GLUCERNA SHAKE) LIQD Take 237 mLs by mouth 2 (two) times daily between meals. 12/16/15   Silver Huguenin Elgergawy, MD  Ferrous Gluconate 324 (37.5 Fe) MG TABS Take 1 tablet (324 mg total) by mouth daily. 12/30/15   Maren Reamer, MD  gabapentin (NEURONTIN) 300 MG capsule Take 2 capsules (600 mg total) by mouth 3 (three) times daily. 12/30/15   Maren Reamer, MD  glucose blood test strip Use as instructed 12/30/15   Maren Reamer, MD  Insulin Glargine (LANTUS SOLOSTAR) 100 UNIT/ML Solostar Pen Inject 20 Units into the skin daily at 10 pm. 12/30/15   Maren Reamer, MD  Insulin Pen Needle Flossie Buffy MICRO PEN  NEEDLES) 32G X 4 MM MISC 1 applicator by Does not apply route at bedtime. 12/30/15   Maren Reamer, MD  Lancet Devices [facility] lancets Use as instructed 12/30/15   Maren Reamer, MD  metFORMIN (GLUCOPHAGE) 1000 MG tablet Take 1 tablet (1,000 mg total) by mouth 2 (two) times daily with a meal. 12/30/15   Maren Reamer, MD  Probiotic Product ([employer] DIGESTIVE PROBIOTIC) CAPS Take 1 capsule by mouth 2 (two) times daily.    Historical Provider, MD  saccharomyces boulardii (FLORASTOR) 250 MG capsule Take 1 capsule (250 mg total) by mouth 2 (two) times daily. Any probiotic that is covered Patient not  taking: Reported on 02/25/2016 01/16/16   Thurnell Lose, MD  Vitamin D, Ergocalciferol, (DRISDOL) 50000 units CAPS capsule Take 1 capsule (50,000 Units total) by mouth every 7 (seven) days. 12/31/15   Maren Reamer, MD    Family History History reviewed. No pertinent family history.  Social History Social History  Substance Use Topics  . Smoking status: Never Smoker  . Smokeless tobacco: Never Used  . Alcohol use No     Allergies   Review of patient's allergies indicates no known allergies.   Review of Systems Review of Systems  Constitutional: Negative for appetite change and fatigue.  HENT: Negative for congestion, ear discharge and sinus pressure.   Eyes: Negative for discharge.  Respiratory: Negative for cough.   Cardiovascular: Negative for chest pain.  Gastrointestinal: Negative for abdominal pain and diarrhea.  Genitourinary: Negative for frequency and hematuria.  Musculoskeletal: Positive for back pain.  Skin: Negative for rash.  Neurological: Negative for seizures and headaches.  Psychiatric/Behavioral: Negative for hallucinations.     Physical Exam Updated Vital Signs BP 167/77   Pulse 86   Temp 98.3 F (36.8 C) (Oral)   Resp 18   SpO2 100%   Physical Exam  Constitutional: She is oriented to person, place, and time. She appears well-developed.  HENT:  Head: Normocephalic.  Eyes: Conjunctivae and EOM are normal. No scleral icterus.  Neck: Neck supple. No thyromegaly present.  Cardiovascular: Normal rate and regular rhythm.  Exam reveals no gallop and no friction rub.   No murmur heard. Pulmonary/Chest: No stridor. She has no wheezes. She has no rales. She exhibits no tenderness.  Abdominal: She exhibits no distension. There is no tenderness. There is no rebound.  Musculoskeletal: She exhibits no edema.  Tender lower thoracic and upper lumbar spine  Lymphadenopathy:    She has no cervical adenopathy.  Neurological: She is oriented to person, place,  and time. She displays normal reflexes. She exhibits normal muscle tone. Coordination normal.  Skin: No rash noted. No erythema.  Psychiatric: She has a normal mood and affect. Her behavior is normal.     ED Treatments / Results  Labs (all labs ordered are listed, but only abnormal results are displayed) Labs Reviewed  CBC WITH DIFFERENTIAL/PLATELET - Abnormal; Notable for the following:       Result Value   WBC 3.8 (*)    Hemoglobin 9.0 (*)    HCT 28.1 (*)    MCV 68.2 (*)    MCH 21.8 (*)    RDW 17.7 (*)    Platelets 99 (*)    All other components within normal limits  COMPREHENSIVE METABOLIC PANEL - Abnormal; Notable for the following:    Glucose, Bld 311 (*)    Calcium 8.6 (*)    Total Protein 5.6 (*)    Albumin 2.8 (*)  All other components within normal limits  URINALYSIS, ROUTINE W REFLEX MICROSCOPIC (NOT AT [facility]) - Abnormal; Notable for the following:    APPearance CLOUDY (*)    Glucose, UA >1000 (*)    Hgb urine dipstick TRACE (*)    Protein, ur 30 (*)    All other components within normal limits  URINE MICROSCOPIC-ADD ON - Abnormal; Notable for the following:    Squamous Epithelial / LPF 0-5 (*)    All other components within normal limits  CBG MONITORING, ED - Abnormal; Notable for the following:    Glucose-Capillary 332 (*)    All other components within normal limits  LIPASE, BLOOD  I-STAT TROPOININ, ED  I-STAT TROPOININ, ED    EKG  EKG Interpretation  Date/Time:  Tuesday February 25 2016 15:30:07 EDT Ventricular Rate:  78 PR Interval:    QRS Duration: 78 QT Interval:  376 QTC Calculation: 429 R Axis:   57 Text Interpretation:  Sinus rhythm Probable anteroseptal infarct, old ST elevation, consider inferior injury Baseline wander in lead(s) V2 Confirmed by Maylee Bare  MD, Aryelle Figg [phone]) on 02/25/2016 3:33:17 PM Also confirmed by Tlaloc Taddei  MD, Broadus John [phone]), editor [place] CT, Leda Gauze [phone])  on 02/25/2016 4:09:44 PM       Radiology Ct Chest W  Contrast  Result Date: 02/25/2016 CLINICAL DATA:  Pain and tingling in both lower extremities. History of diabetes. EXAM: CT CHEST, ABDOMEN, AND PELVIS WITH CONTRAST TECHNIQUE: Multidetector CT imaging of the chest, abdomen and pelvis was performed following the standard protocol during bolus administration of intravenous contrast. CONTRAST:  100 ISOVUE-300 IOPAMIDOL (ISOVUE-300) INJECTION 61% COMPARISON:  CT exams dating back through 05/04/2014 most recent 01/09/2016 FINDINGS: CT CHEST FINDINGS Cardiovascular: Top normal size cardiac chambers. Minimal coronary arteriosclerosis. No pericardial effusion. The main pulmonary artery is dilated up to 3.3 cm versus the ascending aorta at 3 cm. No aortic dissection nor aneurysm. Mediastinum/Nodes: No mediastinal nor hilar adenopathy. Small calcified right paratracheal lymph nodes. No axillary lymphadenopathy. Lungs/Pleura: Chronic superior segment left lower lobe consolidation along the descending thoracic aorta. Tiny tree-in-bud densities in the both lower lobes consistent bronchiolitis. Chronic stable thick-walled pneumatocele. Stable subpleural right lower lobe lateral basal 15-a 16 mm masslike opacity possibly representing rounded atelectasis. Musculoskeletal: No acute osseous abnormality Other: Stable hypodense thyroid nodules the largest on the left measuring 22 mm. CT ABDOMEN PELVIS FINDINGS Hepatobiliary: 8 mm hypodensity in the anterior right hepatic lobe unchanged in appearance possibly a cyst or hemangioma. Cholecystectomy with mild dextro hepatic ductal dilatation which can be seen in the setting of prior cholecystectomy. This is stable in appearance. Pancreas: Unremarkable. No pancreatic ductal dilatation or surrounding inflammatory changes. Spleen: Normal in size without focal abnormality. Adrenals/Urinary Tract: Adrenal glands are unremarkable. Kidneys are normal, without renal calculi, focal lesion, or hydronephrosis. Bladder is prominently distended  measuring 11.2 by 9.8 by 13.3 cm. Stomach/Bowel: Moderate to marked fecal retention consistent with constipation. Vascular/Lymphatic: No abdominal aortic aneurysm or dissection. Mild atherosclerosis of the abdominal aorta common iliacs. Celiac axis, SMA, bilateral renal arteries and IMA are patent. Reproductive: Uterus and bilateral adnexa are unremarkable for age. Other: No free air nor free fluid Musculoskeletal: Disc bulges at L2-3, L3-4, L4-5 and L5/S1 causing moderate to marked spinal stenosis at L4-5 due to moderate central disc bulge and ligamentum flavum hypertrophy. Moderate central disc bulging at L3-4 and impressing upon the ventral aspect of thecal sac and encroaching bilaterally on the neural foramina by without contacting the exiting nerves. Mild to moderate central  to right lateral disc bulge narrowing the right neural foramen and impressing upon the right paracentral portion of the thecal sac. No acute osseous abnormality IMPRESSION: Multilevel moderate-sized lumbar disc bulges from L2-3 through L5-S1 most prominent at L4-5 causing moderate-to-marked canal stenosis due to disc bulge and ligamentum flavum hypertrophy. Tree-in-bud appearance of pulmonary interstitium in both lower lobes which can be seen in bronchitis. Stable subpleural rounded 15 mm masslike opacity in the right lower lobe that may reflect rounded atelectasis. As neoplasm is not entirely excluded, CT surveillance/followup is recommended in 3 months. This area was obscured by pulmonary consolidation on the 2015 CT. Dilated main pulmonary artery suggesting pulmonary hypertension. Unchanged slightly thick-walled pneumatocele in the right lower lobe. Small postinfectious cavitation not excluded. Distended bladder. If the patient is not able to void, consider Foley catheterization to decompress the bladder. Electronically Signed   By: Ashley Royalty M.D.   On: 02/25/2016 19:43   Ct Abdomen Pelvis W Contrast  Result Date:  02/25/2016 CLINICAL DATA:  Pain and tingling in both lower extremities. History of diabetes. EXAM: CT CHEST, ABDOMEN, AND PELVIS WITH CONTRAST TECHNIQUE: Multidetector CT imaging of the chest, abdomen and pelvis was performed following the standard protocol during bolus administration of intravenous contrast. CONTRAST:  100 ISOVUE-300 IOPAMIDOL (ISOVUE-300) INJECTION 61% COMPARISON:  CT exams dating back through 05/04/2014 most recent 01/09/2016 FINDINGS: CT CHEST FINDINGS Cardiovascular: Top normal size cardiac chambers. Minimal coronary arteriosclerosis. No pericardial effusion. The main pulmonary artery is dilated up to 3.3 cm versus the ascending aorta at 3 cm. No aortic dissection nor aneurysm. Mediastinum/Nodes: No mediastinal nor hilar adenopathy. Small calcified right paratracheal lymph nodes. No axillary lymphadenopathy. Lungs/Pleura: Chronic superior segment left lower lobe consolidation along the descending thoracic aorta. Tiny tree-in-bud densities in the both lower lobes consistent bronchiolitis. Chronic stable thick-walled pneumatocele. Stable subpleural right lower lobe lateral basal 15-a 16 mm masslike opacity possibly representing rounded atelectasis. Musculoskeletal: No acute osseous abnormality Other: Stable hypodense thyroid nodules the largest on the left measuring 22 mm. CT ABDOMEN PELVIS FINDINGS Hepatobiliary: 8 mm hypodensity in the anterior right hepatic lobe unchanged in appearance possibly a cyst or hemangioma. Cholecystectomy with mild dextro hepatic ductal dilatation which can be seen in the setting of prior cholecystectomy. This is stable in appearance. Pancreas: Unremarkable. No pancreatic ductal dilatation or surrounding inflammatory changes. Spleen: Normal in size without focal abnormality. Adrenals/Urinary Tract: Adrenal glands are unremarkable. Kidneys are normal, without renal calculi, focal lesion, or hydronephrosis. Bladder is prominently distended measuring 11.2 by 9.8 by 13.3  cm. Stomach/Bowel: Moderate to marked fecal retention consistent with constipation. Vascular/Lymphatic: No abdominal aortic aneurysm or dissection. Mild atherosclerosis of the abdominal aorta common iliacs. Celiac axis, SMA, bilateral renal arteries and IMA are patent. Reproductive: Uterus and bilateral adnexa are unremarkable for age. Other: No free air nor free fluid Musculoskeletal: Disc bulges at L2-3, L3-4, L4-5 and L5/S1 causing moderate to marked spinal stenosis at L4-5 due to moderate central disc bulge and ligamentum flavum hypertrophy. Moderate central disc bulging at L3-4 and impressing upon the ventral aspect of thecal sac and encroaching bilaterally on the neural foramina by without contacting the exiting nerves. Mild to moderate central to right lateral disc bulge narrowing the right neural foramen and impressing upon the right paracentral portion of the thecal sac. No acute osseous abnormality IMPRESSION: Multilevel moderate-sized lumbar disc bulges from L2-3 through L5-S1 most prominent at L4-5 causing moderate-to-marked canal stenosis due to disc bulge and ligamentum flavum hypertrophy. Tree-in-bud appearance of pulmonary  interstitium in both lower lobes which can be seen in bronchitis. Stable subpleural rounded 15 mm masslike opacity in the right lower lobe that may reflect rounded atelectasis. As neoplasm is not entirely excluded, CT surveillance/followup is recommended in 3 months. This area was obscured by pulmonary consolidation on the 2015 CT. Dilated main pulmonary artery suggesting pulmonary hypertension. Unchanged slightly thick-walled pneumatocele in the right lower lobe. Small postinfectious cavitation not excluded. Distended bladder. If the patient is not able to void, consider Foley catheterization to decompress the bladder. Electronically Signed   By: Ashley Royalty M.D.   On: 02/25/2016 19:43    Procedures Procedures (including critical care time)  Medications Ordered in  ED Medications  sodium chloride 0.9 % bolus 500 mL (0 mLs Intravenous Stopped 02/25/16 1749)  morphine 2 MG/ML injection 2 mg (2 mg Intravenous Given 02/25/16 1541)  iopamidol (ISOVUE-300) 61 % injection (100 mLs  Contrast Given 02/25/16 1847)     Initial Impression / Assessment and Plan / ED Course  I have reviewed the triage vital signs and the nursing notes.  Pertinent labs & imaging results that were available during my care of the patient were reviewed by me and considered in my medical decision making (see chart for details).  Clinical Course    Patient with back pain and lower leg pain. CT scan shows some bulging disks in her  lumbar spine,   patient will be put on Ultram and follow-up with her PCP  Final Clinical Impressions(s) / ED Diagnoses   Final diagnoses:  None    New Prescriptions New Prescriptions   No medications on file     Milton Ferguson, MD 02/25/16 2008

## 2016-02-25 NOTE — ED Triage Notes (Signed)
Pt started having pain and tingling in bilateral lower legs last night around 8pm. Pt states she does feel weak. Pt has diabetes and states this has happened before. Pt denies any other symptoms.

## 2016-02-25 NOTE — ED Notes (Signed)
Patient transported to CT 

## 2016-02-28 ENCOUNTER — Inpatient Hospital Stay: Admission: RE | Admit: 2016-02-28 | Payer: Self-pay | Source: Ambulatory Visit

## 2016-02-28 NOTE — Telephone Encounter (Signed)
Pt is scheduled to see RB 10.12.17 Will continue to hold message until we hear back from the [facility]

## 2016-03-02 NOTE — Telephone Encounter (Signed)
[name] from [facility] called to confirm the appt for the patient - she states that she spoke to patient son at [phone]. - She can be reached at [phone] if needed -pr

## 2016-03-02 NOTE — Telephone Encounter (Signed)
No new CT scan necessary at this time.

## 2016-03-02 NOTE — Telephone Encounter (Signed)
Spoke with Olivia Williamson - Pt is no longer infectious and should be fine to come to her appt 03/05/16. Pt has already had a CT scan 02/25/16 while in hospital (CT with Contrast). Please advise if you are still wanting the CT chest without contrast that was previously ordered. [name] states that this scan should be detailed enough that we would not need to have another done. Please advise RB so that we can call and remind the patient and confirm upcoming appts. Thanks.

## 2016-03-02 NOTE — Telephone Encounter (Signed)
Called patient to confirm upcoming appt - no voicemail set up. Unable to reach.  [name]

## 2016-03-02 NOTE — Telephone Encounter (Signed)
LMTCB for Olivia Williamson it assess if pt's TB is communicable at this time.

## 2016-03-03 ENCOUNTER — Other Ambulatory Visit: Payer: Self-pay | Admitting: Pharmacist

## 2016-03-05 ENCOUNTER — Ambulatory Visit (INDEPENDENT_AMBULATORY_CARE_PROVIDER_SITE_OTHER): Payer: Medicare Other | Admitting: Emergency Medicine

## 2016-03-05 ENCOUNTER — Encounter: Payer: Self-pay | Admitting: Emergency Medicine

## 2016-03-05 VITALS — BP 122/76 | HR 78 | Wt 80.0 lb

## 2016-03-05 DIAGNOSIS — R739 Hyperglycemia, unspecified: Secondary | ICD-10-CM

## 2016-03-05 DIAGNOSIS — A159 Respiratory tuberculosis unspecified: Secondary | ICD-10-CM

## 2016-03-05 DIAGNOSIS — K219 Gastro-esophageal reflux disease without esophagitis: Secondary | ICD-10-CM

## 2016-03-05 DIAGNOSIS — R938 Abnormal findings on diagnostic imaging of other specified body structures: Secondary | ICD-10-CM

## 2016-03-05 DIAGNOSIS — R9389 Abnormal findings on diagnostic imaging of other specified body structures: Secondary | ICD-10-CM

## 2016-03-05 DIAGNOSIS — E1142 Type 2 diabetes mellitus with diabetic polyneuropathy: Secondary | ICD-10-CM

## 2016-03-05 NOTE — Telephone Encounter (Signed)
[facility]

## 2016-03-05 NOTE — Telephone Encounter (Signed)
Done

## 2016-03-05 NOTE — Telephone Encounter (Signed)
Per Olivia [facility]bergenee at [facility] Patient has had labs drawn.  Does not need labs.  Pts change in meds are:  Isoniazid 300mg  QD, Rifampin 450mg  QD, Pirazinamida 1000mg  QD (will stop in 3 weeks), Vitamin B6 50mg  QD  Patient needs referral to PCP for Diabetes. She has had a lot of heartburn, so she has also started taking Zantac OTC 150mg  BID  Olivia Williamson requests that OV notes be faxed to her after patient's visit:  Olivia LandsbergRenee - Fax: [phone]  FYI to Olivia AbedLindsay and Dr. Delton CoombesByrum

## 2016-03-05 NOTE — Progress Notes (Signed)
Subjective:    Patient ID: Olivia Williamson, female    DOB: March 30, 1946, 70 y.o.   MRN: [id number]  HPI Pleasant 70 year old woman, never smoker, [place] She was admitted from 01/09/16-01/16/16 and evaluated for cough, dyspnea, left-sided chest discomfort. CT scan of the chest was performed on /17/17 that showed scattered pulmonary nodules with a left lower lobe superior segmental necrotizing shin. Also noted was right lower lobe consolidation with a new cavitary component and air-fluid level. Bronchoscopy was performed on 8/18. Was treated empirically w augmentin for lung abscesses. 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 She is on  Isoniazid 3100m QD, Rifampin 455mQD, Pirazinamida 100055mD (will stop in 3 weeks), Vitamin B6 20m12m as managed by the health department. A repeat Ct chest was done 02/25/16 shows no significant change on the right, there may be some improvement in her micronodular disease on the left. She speaks to me through a mediForensic scientiste feels some better than in the hospital. Some cough, non-productive. No hemoptysis. Her wt has ben stable since hospital.   Per health dept notes she needs referral to a PCP for DM, GERD.     Review of Systems  Respiratory: Positive for cough.   Neurological: Positive for dizziness.  All other systems reviewed and are negative.   Past Medical History:  Diagnosis Date  . Arthritis   . Diabetes mellitus without complication (HCC)[place] stopped DM meds 2004  . GERD (gastroesophageal reflux disease)   . Hypertension    meds stopped 1 week ago , need to get renewed  . Tuberculosis    ACTIVE     No family history on file.   Social History   Social History  . Marital status: Widowed    Spouse name: N/A  . Number of children: N/A  . Years of education: N/A   Occupational History  . Not on file.   Social History Main Topics  . Smoking status: Never Smoker  . Smokeless tobacco: Never Used  . Alcohol use No  . Drug use: No  .  Sexual activity: Not on file   Other Topics Concern  . Not on file   Social History Narrative  . No narrative on file     No Known Allergies   Outpatient Medications Prior to Visit  Medication Sig Dispense Refill  . amoxicillin-clavulanate (AUGMENTIN) 875-125 MG tablet Take 1 tablet by mouth every 12 (twelve) hours. 98 tablet 0  . blood glucose meter kit and supplies KIT Dispense based on patient and insurance preference. Use up to four times daily as directed. (FOR ICD-9 250.00, 250.01). 1 each 0  . feeding supplement, GLUCERNA SHAKE, (GLUCERNA SHAKE) LIQD Take 237 mLs by mouth 2 (two) times daily between meals.  0  . Ferrous Gluconate 324 (37.5 Fe) MG TABS Take 1 tablet (324 mg total) by mouth daily. 60 tablet 2  . gabapentin (NEURONTIN) 300 MG capsule Take 2 capsules (600 mg total) by mouth 3 (three) times daily. 180 capsule 2  . glucose blood test strip Use as instructed 100 each 12  . Insulin Glargine (LANTUS SOLOSTAR) 100 UNIT/ML Solostar Pen Inject 20 Units into the skin daily at 10 pm. 2 pen 3  . Insulin Pen Needle (ULTICARE MICRO PEN NEEDLES) 32G X 4 MM MISC 1 applicator by Does not apply route at bedtime. 100 each 2  . Lancet Devices (ACCU-CHEK SOFTCLIX) lancets Use as instructed 1 each 11  . metFORMIN (GLUCOPHAGE) 1000 MG  tablet Take 1 tablet (1,000 mg total) by mouth 2 (two) times daily with a meal. 60 tablet 1  . Probiotic Product ([employer] DIGESTIVE PROBIOTIC) CAPS Take 1 capsule by mouth 2 (two) times daily.    Marland Kitchen saccharomyces boulardii (FLORASTOR) 250 MG capsule Take 1 capsule (250 mg total) by mouth 2 (two) times daily. Any probiotic that is covered (Patient not taking: Reported on 02/25/2016) 60 capsule 2  . traMADol (ULTRAM) 50 MG tablet Take 1 tablet (50 mg total) by mouth every 6 (six) hours as needed. 15 tablet 0  . Vitamin D, Ergocalciferol, (DRISDOL) 50000 units CAPS capsule Take 1 capsule (50,000 Units total) by mouth every 7 (seven) days. 12 capsule 0   No  facility-administered medications prior to visit.         Objective:   Physical Exam Vitals:   03/05/16 1609 03/05/16 1610  BP:  122/76  Pulse:  78  SpO2:  100%  Weight: 80 lb (36.3 kg)    Gen: Pleasant, thin woman, in no distress,  normal affect  ENT: No lesions,  mouth clear,  oropharynx clear, no postnasal drip  Neck: No JVD, no TMG, no carotid bruits  Lungs: No use of accessory muscles, clear without rales or rhonchi  Cardiovascular: RRR, heart sounds normal, no murmur or gallops, no peripheral edema  Musculoskeletal: No deformities, no cyanosis or clubbing  Neuro: alert, non focal  Skin: Warm, no lesions or rashes    83moago  M tuberculosis complex Positive    Comments: REPORTED/FAXED TO [facility] ON 02/05/16 AT 1005  M avium complex Negative   M k[place]ii Not Indicated   M gordonae Not Indicated       MTB Susceptibility Broth, reflexed  Order: [id number] Status:  Preliminary result Visible to patient:  No (Not Released) Next appt:  None   Ref Range & Units 121mogo  Organism ID  Comment    Comments: Mycobacterium tuberculosis complex  Streptomycin 1 ug/mL Susceptible Susceptible   Isoniazid 0.1 ug/mL Susceptible Susceptible   Rifampin 1 ug/mL Susceptible Susceptible   Ethambutol 5 ug/mL  PENDING   Pyrazinamide 100 ug/mL Susceptible Susceptible         CXT chest 02/25/16 --  COMPARISON:  CT exams dating back through 05/04/2014 most recent 01/09/2016  FINDINGS: CT CHEST FINDINGS  Cardiovascular: Top normal size cardiac chambers. Minimal coronary arteriosclerosis. No pericardial effusion. The main pulmonary artery is dilated up to 3.3 cm versus the ascending aorta at 3 cm. No aortic dissection nor aneurysm.  Mediastinum/Nodes: No mediastinal nor hilar adenopathy. Small calcified right paratracheal lymph nodes. No axillary lymphadenopathy.  Lungs/Pleura: Chronic superior segment left lower lobe consolidation along the descending  thoracic aorta. Tiny tree-in-bud densities in the both lower lobes consistent bronchiolitis. Chronic stable thick-walled pneumatocele. Stable subpleural right lower lobe lateral basal 15-a 16 mm masslike opacity possibly representing rounded atelectasis.  Musculoskeletal: No acute osseous abnormality  Other: Stable hypodense thyroid nodules the largest on the left measuring 22 mm.  CT ABDOMEN PELVIS FINDINGS  Hepatobiliary: 8 mm hypodensity in the anterior right hepatic lobe unchanged in appearance possibly a cyst or hemangioma. Cholecystectomy with mild dextro hepatic ductal dilatation which can be seen in the setting of prior cholecystectomy. This is stable in appearance.  Pancreas: Unremarkable. No pancreatic ductal dilatation or surrounding inflammatory changes.  Spleen: Normal in size without focal abnormality.  Adrenals/Urinary Tract: Adrenal glands are unremarkable. Kidneys are normal, without renal calculi, focal lesion, or hydronephrosis. Bladder is prominently distended  measuring 11.2 by 9.8 by 13.3 cm.  Stomach/Bowel: Moderate to marked fecal retention consistent with constipation.  Vascular/Lymphatic: No abdominal aortic aneurysm or dissection. Mild atherosclerosis of the abdominal aorta common iliacs. Celiac axis, SMA, bilateral renal arteries and IMA are patent.  Reproductive: Uterus and bilateral adnexa are unremarkable for age.  Other: No free air nor free fluid  Musculoskeletal: Disc bulges at L2-3, L3-4, L4-5 and L5/S1 causing moderate to marked spinal stenosis at L4-5 due to moderate central disc bulge and ligamentum flavum hypertrophy. Moderate central disc bulging at L3-4 and impressing upon the ventral aspect of thecal sac and encroaching bilaterally on the neural foramina by without contacting the exiting nerves. Mild to moderate central to right lateral disc bulge narrowing the right neural foramen and impressing upon the right  paracentral portion of the thecal sac. No acute osseous abnormality  IMPRESSION: Multilevel moderate-sized lumbar disc bulges from L2-3 through L5-S1 most prominent at L4-5 causing moderate-to-marked canal stenosis due to disc bulge and ligamentum flavum hypertrophy.  Tree-in-bud appearance of pulmonary interstitium in both lower lobes which can be seen in bronchitis.  Stable subpleural rounded 15 mm masslike opacity in the right lower lobe that may reflect rounded atelectasis. As neoplasm is not entirely excluded, CT surveillance/followup is recommended in 3 months. This area was obscured by pulmonary consolidation on the 2015 CT.  Dilated main pulmonary artery suggesting pulmonary hypertension.  Unchanged slightly thick-walled pneumatocele in the right lower lobe. Small postinfectious cavitation not excluded.  Distended bladder. If the patient is not able to void, consider Foley catheterization to decompress the bladder.      Assessment & Plan:  GERD (gastroesophageal reflux disease) Continue current therapy. Will refer to PCP  Hyperglycemia With probable DM. She needs a PCP. Health Department asked me to refer her - will send her to [facility].   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 Receiving observed daily therapy through the health Dept. Will plan to repeat her Ct scan as below  Abnormal CT of the chest From M tb disease. Plan repeat Ct chest in April, no contrast. Follow to review interval change. May affect duration of therapy (if progression).   Baltazar Apo, MD, PhD 03/05/2016, 4:35 PM [facility] [phone] or if no answer [phone]

## 2016-03-05 NOTE — Assessment & Plan Note (Signed)
In setting micro and macronodular disease, confirmed on FOB. Receiving observed daily therapy through the health Dept. Will plan to repeat her Ct scan as below

## 2016-03-05 NOTE — Assessment & Plan Note (Signed)
Continue current therapy. Will refer to PCP

## 2016-03-05 NOTE — Assessment & Plan Note (Signed)
With probable DM. She needs a PCP. Health Department asked me to refer her - will [facility].

## 2016-03-05 NOTE — Assessment & Plan Note (Signed)
From M tb disease. Plan repeat Ct chest in April, no contrast. Follow to review interval change. May affect duration of therapy (if progression).

## 2016-03-05 NOTE — Patient Instructions (Signed)
Please continue your Isoniazid, Rifampin, Pirazinamida, Vitamin as directed by the Health Department We will refer you to a Primary Physician to help manage your diabetes and gastroesophageal reflux.  You will need a repeat CT scan chest in April 2018, no contrast, to follow pulmonary nodules.  Follow with Dr Delton CoombesByrum in April after the CT scan to review the results.

## 2016-03-11 ENCOUNTER — Telehealth: Payer: Self-pay

## 2016-03-11 MED ORDER — INSULIN GLARGINE 100 UNIT/ML SOLOSTAR PEN
20.0000 [IU] | PEN_INJECTOR | Freq: Every day | SUBCUTANEOUS | 0 refills | Status: DC
Start: 2016-03-11 — End: 2016-03-27

## 2016-03-11 NOTE — Telephone Encounter (Signed)
[name] called and stated patient is needing lantus refilled. She also stated pt was using more than what she suppose to and will run out tomorrow. She also states she has an appointment with a NP on 04-10-2016.   [facility] [address]

## 2016-03-11 NOTE — Telephone Encounter (Signed)
[facility] If patient has insurance - they may not fill it early but patient needs to be seen as soon as possible if insulin adjustment is needed.

## 2016-03-12 ENCOUNTER — Telehealth: Payer: Self-pay | Admitting: Emergency Medicine

## 2016-03-13 NOTE — Telephone Encounter (Signed)
[facility] stated that she is working with the pt---she has set the pt up with a new PCP--and she is aware that [facility] does not fill her insulin.  She stated that the pt about about 1 week left and she had been taking the insulin 2 times per day instead on once as prescribed.  [name] stated that if needed she will call the new PCP to see if they could get her in sooner.  Nothing further is needed.

## 2016-03-27 ENCOUNTER — Ambulatory Visit: Payer: Medicare Other | Attending: Internal Medicine | Admitting: Physician Assistant

## 2016-03-27 ENCOUNTER — Inpatient Hospital Stay: Payer: Self-pay

## 2016-03-27 VITALS — BP 124/76 | HR 81 | Temp 97.8°F | Resp 16 | Wt 78.0 lb

## 2016-03-27 DIAGNOSIS — K219 Gastro-esophageal reflux disease without esophagitis: Secondary | ICD-10-CM | POA: Insufficient documentation

## 2016-03-27 DIAGNOSIS — E1142 Type 2 diabetes mellitus with diabetic polyneuropathy: Secondary | ICD-10-CM | POA: Diagnosis present

## 2016-03-27 DIAGNOSIS — M1990 Unspecified osteoarthritis, unspecified site: Secondary | ICD-10-CM | POA: Diagnosis not present

## 2016-03-27 DIAGNOSIS — A159 Respiratory tuberculosis unspecified: Secondary | ICD-10-CM | POA: Insufficient documentation

## 2016-03-27 DIAGNOSIS — Z79899 Other long term (current) drug therapy: Secondary | ICD-10-CM | POA: Insufficient documentation

## 2016-03-27 DIAGNOSIS — Z794 Long term (current) use of insulin: Secondary | ICD-10-CM | POA: Insufficient documentation

## 2016-03-27 DIAGNOSIS — I10 Essential (primary) hypertension: Secondary | ICD-10-CM | POA: Insufficient documentation

## 2016-03-27 LAB — POCT GLYCOSYLATED HEMOGLOBIN (HGB A1C): Hemoglobin A1C: 6.7

## 2016-03-27 LAB — GLUCOSE, POCT (MANUAL RESULT ENTRY): POC Glucose: 306 mg/dl — AB (ref 70–99)

## 2016-03-27 MED ORDER — INSULIN GLARGINE 100 UNIT/ML SOLOSTAR PEN: 25.0000 [IU] | PEN_INJECTOR | Freq: Every day | SUBCUTANEOUS | 2 refills | Status: DC

## 2016-03-27 NOTE — Progress Notes (Signed)
Olivia Williamson, is a 71 y.o. female  ZHG:[id number]  HDQ:[id number]  DOB - 08-Nov-1945  Subjective:  Chief Complaint and HPI: Olivia Williamson is a 70 y.o. female here today to establish care and for a follow up visit after being seen in the ED for back pain but at some point found to have active TB. She is currently on 6 months Isoniazid, Pyrazinamide, and Rifampin.  Her grandson is with her today translating.  Her cough and appetite have improved.  She has been checking her blood sugars and brings in her CBG log.  All readings in the 200-300 range over the last few weeks.  She has been out of her Lantus X 4 days and has been compliant with 20units at bedtime.  Her BP was elevated in the ED.  It is normal today not on any antihypertensives.  She has not been getting consistent medical care prior to all of this.    ED/Hospital notes reviewed.    ROS:   Constitutional:  No f/c, No night sweats, No unexplained weight loss. EENT:  No vision changes, No blurry vision, No hearing changes. No mouth, throat, or ear problems.  Respiratory: +cough that has improved, No SOB Cardiac: No CP, no palpitations GI:  No abd pain, No N/V/D. GU: No Urinary s/sx Musculoskeletal: No joint pain Neuro: No headache, no dizziness, no motor weakness.  Skin: No rash Endocrine:  No polydipsia. No polyuria.  Psych: Denies SI/HI  No problems updated.  ALLERGIES: No Known Allergies  PAST MEDICAL HISTORY: Past Medical History:  Diagnosis Date  . Arthritis   . Diabetes mellitus without complication ([place])    stopped DM meds 2004  . GERD (gastroesophageal reflux disease)   . Hypertension    meds stopped 1 week ago , need to get renewed  . Tuberculosis    ACTIVE    MEDICATIONS AT HOME: Prior to Admission medications   Medication Sig Start Date End Date Taking? Authorizing Provider  Insulin Glargine (LANTUS SOLOSTAR) 100 UNIT/ML Solostar Pen Inject 25 Units into the skin daily at 10 pm. 03/27/16  Yes Dionne Bucy Dong Nimmons,  PA-C  isoniazid (NYDRAZID) 300 MG tablet Take by mouth daily.   Yes Historical Provider, MD  pyrazinamide 500 MG tablet Take 1,000 mg by mouth daily.   Yes Historical Provider, MD  pyridOXINE (VITAMIN B-6) 50 MG tablet Take 50 mg by mouth daily.   Yes Historical Provider, MD  rifampin (RIFADIN) 150 MG capsule Take 150 mg by mouth daily.   Yes Historical Provider, MD  blood glucose meter kit and supplies KIT Dispense based on patient and insurance preference. Use up to four times daily as directed. (FOR ICD-9 250.00, 250.01). 12/16/15   Albertine Patricia, MD  feeding supplement, GLUCERNA SHAKE, (GLUCERNA SHAKE) LIQD Take 237 mLs by mouth 2 (two) times daily between meals. 12/16/15   Silver Huguenin Elgergawy, MD  Ferrous Gluconate 324 (37.5 Fe) MG TABS Take 1 tablet (324 mg total) by mouth daily. 12/30/15   Maren Reamer, MD  gabapentin (NEURONTIN) 300 MG capsule Take 2 capsules (600 mg total) by mouth 3 (three) times daily. 12/30/15   Maren Reamer, MD  glucose blood test strip Use as instructed 12/30/15   Maren Reamer, MD  Insulin Pen Needle (ULTICARE MICRO PEN NEEDLES) 32G X 4 MM MISC 1 applicator by Does not apply route at bedtime. 12/30/15   Maren Reamer, MD  Lancet Devices [facility]) lancets Use as instructed 12/30/15  Maren Reamer, MD  Probiotic Product (CVS DIGESTIVE PROBIOTIC) CAPS Take 1 capsule by mouth 2 (two) times daily.    Historical Provider, MD  saccharomyces boulardii (FLORASTOR) 250 MG capsule Take 1 capsule (250 mg total) by mouth 2 (two) times daily. Any probiotic that is covered Patient not taking: Reported on 03/27/2016 01/16/16   Thurnell Lose, MD  traMADol (ULTRAM) 50 MG tablet Take 1 tablet (50 mg total) by mouth every 6 (six) hours as needed. Patient not taking: Reported on 03/27/2016 02/25/16   Milton Ferguson, MD  Vitamin D, Ergocalciferol, (DRISDOL) 50000 units CAPS capsule Take 1 capsule (50,000 Units total) by mouth every 7 (seven) days. Patient not taking:  Reported on 03/27/2016 12/31/15   Maren Reamer, MD     Objective:  EXAM:   Vitals:   03/27/16 1547  BP: 124/76  Pulse: 81  Resp: 16  Temp: 97.8 F (36.6 C)  TempSrc: Oral  SpO2: 97%  Weight: 78 lb (35.4 kg)    General appearance : A&OX3. NAD. Non-toxic-appearing.  Very thin and frail appearing HEENT: Atraumatic and Normocephalic.  PERRLA. EOM intact.  TM clear B. Mouth-MMM, post pharynx WNL w/o erythema, No PND. Neck: supple, no JVD. No cervical lymphadenopathy. No thyromegaly Chest/Lungs:  Breathing-non-labored, Good air entry bilaterally, breath sounds normal without rales, rhonchi, or wheezing  CVS: S1 S2 regular, no murmurs, gallops, rubs  Extremities: Bilateral Lower Ext shows no edema, both legs are warm to touch with = pulse throughout Neurology:  CN II-XII grossly intact, Non focal.   Psych:  TP linear. J/I WNL. Normal speech. Appropriate eye contact and affect.  Skin:  No Rash  Data Review Lab Results  Component Value Date   HGBA1C 6.7 03/27/2016   HGBA1C 12.2 (H) 12/15/2015     Assessment & Plan   1. Type 2 diabetes mellitus with diabetic polyneuropathy, without long-term current use of insulin ([place]) Much improved since Williamson overall, but she brought log in with her and all blood sugars >200s(none under 200) (many>300)over the last few weeks.  I have increased her dose of Lantus to 25 units at bedtime.  She is unable to tolerate Metformin due to GI side effects. - POCT glucose (manual entry) - POCT glycosylated hemoglobin (Hb A1C) Check blood sugars fasting in the morning and at bedtime and record with dates.  Bring to next visit.  Drink 60-80 ounces of water daily.  2. Tuberculosis Being treated on Isoniazid and Rifampin and Pyrazinamide  3. Essential hypertension Controlled and not currently on meds-will not resume any BP meds today.  Patient have been counseled extensively about nutrition and exercise  Return in about 2 weeks (around 04/10/2016) for  establish with PCP; follow up on DM.  The patient was given clear instructions to go to ER or return to medical center if symptoms don't improve, worsen or new problems develop. The patient verbalized understanding. The patient was told to call to get lab results if they haven't heard anything in the next week.     Freeman Caldron, PA-C [facility] [place], [place]   03/27/2016, 4:21 PMPatient ID: Olivia Williamson, female   DOB: 02-Nov-1945, 70 y.o.   MRN: [id number]

## 2016-03-27 NOTE — Progress Notes (Signed)
1. Pain, tingling numbness in hands.Tingling in legs. 2. Also c/o chest tightness x 3 days. 3.  Insomnia LAST DOSE OF INSULIN 2 DAYS AGO.

## 2016-03-27 NOTE — Patient Instructions (Signed)
Check blood sugars fasting in the morning and at bedtime and record.  Bring to next visit.  Drink 60-80 ounces of water daily.

## 2016-03-31 ENCOUNTER — Inpatient Hospital Stay: Payer: Self-pay | Admitting: Internal Medicine

## 2016-03-31 LAB — AFB ORGANISM ID BY DNA PROBE
M AVIUM COMPLEX: NEGATIVE
M tuberculosis complex: POSITIVE — AB

## 2016-03-31 LAB — ACID FAST CULTURE WITH REFLEXED SENSITIVITIES: ACID FAST CULTURE - AFSCU3: POSITIVE — AB

## 2016-03-31 LAB — MTB SUSCEPTIBILITY BROTH, REFLEXED

## 2016-04-10 ENCOUNTER — Other Ambulatory Visit (INDEPENDENT_AMBULATORY_CARE_PROVIDER_SITE_OTHER): Payer: Self-pay

## 2016-04-10 ENCOUNTER — Encounter: Payer: Self-pay | Admitting: Nurse Practitioner

## 2016-04-10 ENCOUNTER — Ambulatory Visit (INDEPENDENT_AMBULATORY_CARE_PROVIDER_SITE_OTHER): Payer: Medicare Other | Admitting: Nurse Practitioner

## 2016-04-10 VITALS — BP 118/60 | HR 77 | Temp 97.8°F | Wt 85.0 lb

## 2016-04-10 DIAGNOSIS — E1142 Type 2 diabetes mellitus with diabetic polyneuropathy: Secondary | ICD-10-CM

## 2016-04-10 DIAGNOSIS — Z780 Asymptomatic menopausal state: Secondary | ICD-10-CM | POA: Diagnosis not present

## 2016-04-10 DIAGNOSIS — Z23 Encounter for immunization: Secondary | ICD-10-CM | POA: Diagnosis not present

## 2016-04-10 DIAGNOSIS — Z1239 Encounter for other screening for malignant neoplasm of breast: Secondary | ICD-10-CM

## 2016-04-10 DIAGNOSIS — Z0001 Encounter for general adult medical examination with abnormal findings: Secondary | ICD-10-CM | POA: Diagnosis not present

## 2016-04-10 DIAGNOSIS — D649 Anemia, unspecified: Secondary | ICD-10-CM

## 2016-04-10 DIAGNOSIS — E041 Nontoxic single thyroid nodule: Secondary | ICD-10-CM

## 2016-04-10 DIAGNOSIS — Z136 Encounter for screening for cardiovascular disorders: Secondary | ICD-10-CM

## 2016-04-10 DIAGNOSIS — Z1231 Encounter for screening mammogram for malignant neoplasm of breast: Secondary | ICD-10-CM | POA: Diagnosis not present

## 2016-04-10 DIAGNOSIS — A159 Respiratory tuberculosis unspecified: Secondary | ICD-10-CM

## 2016-04-10 DIAGNOSIS — Z1211 Encounter for screening for malignant neoplasm of colon: Secondary | ICD-10-CM | POA: Diagnosis not present

## 2016-04-10 DIAGNOSIS — I10 Essential (primary) hypertension: Secondary | ICD-10-CM

## 2016-04-10 LAB — LIPID PANEL
CHOLESTEROL: 151 mg/dL (ref 0–200)
HDL: 83.3 mg/dL (ref >39.00)
LDL CALC: 36 mg/dL (ref 0–99)
NonHDL: 67.43
Total CHOL/HDL Ratio: 2
Triglycerides: 156 mg/dL — ABNORMAL HIGH (ref 0.0–149.0)
VLDL: 31.2 mg/dL (ref 0.0–40.0)

## 2016-04-10 NOTE — Progress Notes (Signed)
Pre visit review using our clinic review tool, if applicable. No additional management support is needed unless otherwise documented below in the visit note. 

## 2016-04-10 NOTE — Progress Notes (Addendum)
Subjective:    Patient ID: Olivia Williamson, female    DOB: 05/05/1946, 70 y.o.   MRN: [id number]  Patient presents today for complete physical or establish care (new patient)  HPI  Patient in office with son (dan) and Astronomer from [employer] (yang Hmok).  Tuberculosis: Treatment start 01/2016, managed by [facility]. Denies any acute respiratory symptoms at this time.  Diabetes: Managed by endocrinologist.  Thyroid nodule: Patient indicates nodule is chronic and has not chnaged in size, no dysphagia, no change in voice. Indicates surgery was recommended several years ago but she declined.  Immunizations: (TDAP, Hep C screen, Pneumovax, Influenza, zoster)  Health Maintenance  Topic Date Due  . Eye exam for diabetics  05/19/1958  . Urine Protein Check  05/19/1958  . Mammogram  05/19/1998  . Colon Cancer Screening  05/19/1998  . Shingles Vaccine  05/19/2008  . DEXA scan (bone density measurement)  05/19/2013  . Hemoglobin A1C  09/24/2016  . Complete foot exam   12/29/2016  . Pneumonia vaccines (2 of 2 - PCV13) 01/10/2017  . Tetanus Vaccine  12/29/2025  . Flu Shot  Completed  .  Hepatitis C: One time screening is recommended by Center for Disease Control  (CDC) for  adults born from [birth year] through 1963.   Completed   Diet:none Weight:  Wt Readings from Last 3 Encounters:  04/10/16 85 lb (38.6 kg)  03/27/16 78 lb (35.4 kg)  03/05/16 80 lb (36.3 kg)   Exercise:none Fall Risk: Fall Risk  04/10/2016 03/27/2016  Falls in the past year? No No   Home Safety:home with son Linna Hoff Depression/Suicide: Depression screen [facility] 2/9 03/27/2016 12/30/2015 12/10/2014  Decreased Interest 0 0 0  Down, Depressed, Hopeless 0 1 0  PHQ - 2 Score 0 1 0  Altered sleeping 3 - -  Tired, decreased energy 1 - -  Change in appetite 0 - -  Feeling bad or failure about yourself  0 - -  Trouble concentrating 0 - -  Moving slowly or fidgety/restless 0 - -  Suicidal thoughts 0 - -    PHQ-9 Score 4 - -   No flowsheet data found. Colonoscopy (every 5-20yr, >50-788yr:needed Dexa (every 2-5y35yr>65y54yreeded Mammogram (yearly, >24yr54yreded Vision:needed Dental:needed Advanced Directive: Advanced Directives 04/10/2016  Does patient have an advance directive? No  Would patient like information on creating an advanced directive? No - patient declined information    Medications and allergies reviewed with patient and updated if appropriate.  Patient Active Problem List   Diagnosis Date Noted  . Thyroid nodule 04/13/2016  . Postmenopausal status 04/13/2016  . Chest pain   . Abnormal CT of the chest   . Tuberculosis 01/09/2016  . Protein-calorie malnutrition, severe 12/16/2015  . Generalized weakness 12/15/2015  . Anemia 12/15/2015  . Hyperglycemia 12/15/2015  . Nausea without vomiting 12/15/2015  . Hypertension   . GERD (gastroesophageal reflux disease)   . Diabetes mellitus with neurological manifestations (HCC)[facility] Current Outpatient Prescriptions on File Prior to Visit  Medication Sig Dispense Refill  . blood glucose meter kit and supplies KIT Dispense based on patient and insurance preference. Use up to four times daily as directed. (FOR ICD-9 250.00, 250.01). 1 each 0  . feeding supplement, GLUCERNA SHAKE, (GLUCERNA SHAKE) LIQD Take 237 mLs by mouth 2 (two) times daily between meals.  0  . Ferrous Gluconate 324 (37.5 Fe) MG TABS Take 1 tablet (324 mg total) by mouth daily. 60 tablet 2  .  gabapentin (NEURONTIN) 300 MG capsule Take 2 capsules (600 mg total) by mouth 3 (three) times daily. 180 capsule 2  . glucose blood test strip Use as instructed 100 each 12  . Insulin Glargine (LANTUS SOLOSTAR) 100 UNIT/ML Solostar Pen Inject 25 Units into the skin daily at 10 pm. 5 pen 2  . Insulin Pen Needle (ULTICARE MICRO PEN NEEDLES) 32G X 4 MM MISC 1 applicator by Does not apply route at bedtime. 100 each 2  . isoniazid (NYDRAZID) 300 MG tablet Take by mouth  daily.    Elmore Guise Devices (ACCU-CHEK SOFTCLIX) lancets Use as instructed 1 each 11  . Probiotic Product ([employer] DIGESTIVE PROBIOTIC) CAPS Take 1 capsule by mouth 2 (two) times daily.    . pyrazinamide 500 MG tablet Take 1,000 mg by mouth daily.    Marland Kitchen pyridOXINE (VITAMIN B-6) 50 MG tablet Take 50 mg by mouth daily.    . rifampin (RIFADIN) 150 MG capsule Take 150 mg by mouth daily.    Marland Kitchen saccharomyces boulardii (FLORASTOR) 250 MG capsule Take 1 capsule (250 mg total) by mouth 2 (two) times daily. Any probiotic that is covered 60 capsule 2  . traMADol (ULTRAM) 50 MG tablet Take 1 tablet (50 mg total) by mouth every 6 (six) hours as needed. 15 tablet 0  . Vitamin D, Ergocalciferol, (DRISDOL) 50000 units CAPS capsule Take 1 capsule (50,000 Units total) by mouth every 7 (seven) days. 12 capsule 0   No current facility-administered medications on file prior to visit.     Past Medical History:  Diagnosis Date  . Arthritis   . Diabetes mellitus without complication ([place])    stopped DM meds 2004  . GERD (gastroesophageal reflux disease)   . Hypertension    meds stopped 1 week ago , need to get renewed  . Tuberculosis    ACTIVE    Past Surgical History:  Procedure Laterality Date  . ABDOMINAL HYSTERECTOMY    . CHOLECYSTECTOMY N/A 05/02/2014   Procedure: LAPAROSCOPIC CHOLECYSTECTOMY ;  Surgeon: Coralie Keens, MD;  Location: [place];  Service: General;  Laterality: N/A;  laparoscopic cholecystectomy  . COLONOSCOPY    . NO PAST SURGERIES    . VIDEO BRONCHOSCOPY Bilateral 01/10/2016   Procedure: VIDEO BRONCHOSCOPY WITHOUT FLUORO;  Surgeon: Collene Gobble, MD;  Location: [place];  Service: Cardiopulmonary;  Laterality: Bilateral;    Social History   Social History  . Marital status: Widowed    Spouse name: N/A  . Number of children: N/A  . Years of education: N/A   Social History Main Topics  . Smoking status: Never Smoker  . Smokeless tobacco: Never Used  . Alcohol use No  . Drug  use: No  . Sexual activity: Not Asked   Other Topics Concern  . None   Social History Narrative  . None    Family History  Problem Relation Age of Onset  . Heart disease Maternal Uncle 70        Review of Systems  Constitutional: Negative for chills, fever, malaise/fatigue and weight loss.  HENT: Negative for congestion, hearing loss and sore throat.   Eyes:       Negative for visual changes  Respiratory: Negative for cough and shortness of breath.   Cardiovascular: Negative for chest pain, palpitations, leg swelling and PND.  Gastrointestinal: Negative for abdominal pain, blood in stool, constipation, diarrhea, heartburn, nausea and vomiting.  Genitourinary: Negative for dysuria, frequency and urgency.  Musculoskeletal: Negative for falls, joint pain and  myalgias.  Skin: Negative for rash.  Neurological: Positive for tingling and sensory change. Negative for dizziness and headaches.       Numbness and tingling in bilateral hands and feet.  Endo/Heme/Allergies: Negative for environmental allergies. Does not bruise/bleed easily.  Psychiatric/Behavioral: Negative for depression, substance abuse and suicidal ideas. The patient is not nervous/anxious.     Objective:   Vitals:   04/10/16 1417  BP: 118/60  Pulse: 77  Temp: 97.8 F (36.6 C)    Body mass index is 15.55 kg/m.   Physical Examination:  Physical Exam  Constitutional: She is oriented to person, place, and time and well-developed, well-nourished, and in no distress. No distress.  HENT:  Right Ear: External ear normal.  Left Ear: External ear normal.  Nose: Nose normal.  Mouth/Throat: Oropharynx is clear and moist. No oropharyngeal exudate.  Eyes: Conjunctivae and EOM are normal. Pupils are equal, round, and reactive to light. No scleral icterus.  Neck: Normal range of motion. Neck supple. No JVD present.  Left side thyroid nodule.  Cardiovascular: Normal rate, normal heart sounds and intact distal  pulses.   Pulmonary/Chest: Effort normal and breath sounds normal. No stridor. She exhibits no tenderness.  Abdominal: Soft. Bowel sounds are normal. She exhibits no distension. There is no tenderness.  Musculoskeletal: Normal range of motion. She exhibits no edema or tenderness.  Lymphadenopathy:    She has no cervical adenopathy.  Neurological: She is alert and oriented to person, place, and time. Gait normal.  Skin: Skin is warm and dry.  Psychiatric: Affect and judgment normal.    ASSESSMENT and PLAN:  Tema was seen today for breathing problem.  Diagnoses and all orders for this visit:  Encounter for preventative adult health care exam with abnormal findings -     Lipid panel; Future -     Ambulatory referral to Gastroenterology -     MM Digital Screening; Future -     Hepatitis C Antibody; Future -     DG Bone Density; Future  Screen for colon cancer -     Ambulatory referral to Gastroenterology  Thyroid nodule -     US Thyroid Biopsy; Future -     Thyroid Panel With TSH; Future  Type 2 diabetes mellitus with diabetic polyneuropathy, without long-term current use of insulin ([place]) -     Ambulatory referral to Ophthalmology -     Ambulatory referral to Podiatry  Breast cancer screening -     MM Digital Screening; Future  Postmenopausal status -     DG Bone Density; Future  Screening for cardiovascular condition -     Lipid panel; Future  Essential hypertension  Tuberculosis  Anemia, unspecified type  Other orders -     Flu vaccine, recombinat, quadrivalent, inj    Thyroid nodule Normal thyroid panel. Repeat next year Pending thyroid US  Hypertension BP controlled with medication at this time.  Diabetes mellitus with neurological manifestations [facility]) Managed by endocrinologist: Freeman Caldron, last seen 03/2016. HgbA1c of 6.7 done 03/2016 with use of Lantus 25units at bedtime. No oral agents. Current use of gabapentin 66m TID for  neuropathy.   Tuberculosis Ongoing treatment with isoniazid, rifampin, and pyrazinamide. Supplement with Vitamin B6. [facility] Treatment started 01/2016  Anemia Stable. CBC Latest Ref Rng & Units 02/25/2016 01/17/2016 01/13/2016  WBC 4.0 - 10.5 K/uL 3.8(L) 4.9 6.2  Hemoglobin 12.0 - 15.0 g/dL 9.0(L) 8.8(L) 8.2(L)  Hematocrit 36.0 - 46.0 % 28.1(L) 28.1(L) 26.6(L)  Platelets 150 - 400 K/uL 99(L) 174 124(L)     Recent Results (from the past 2160 hour(s))  Glucose, capillary     Status: Abnormal   Collection Time: 01/14/16  1:32 PM  Result Value Ref Range   Glucose-Capillary 221 (H) 65 - 99 mg/dL  Glucose, capillary     Status: Abnormal   Collection Time: 01/14/16  4:54 PM  Result Value Ref Range   Glucose-Capillary 301 (H) 65 - 99 mg/dL  Glucose, capillary     Status: Abnormal   Collection Time: 01/14/16 10:15 PM  Result Value Ref Range   Glucose-Capillary 265 (H) 65 - 99 mg/dL  Glucose, capillary     Status: None   Collection Time: 01/15/16  8:01 AM  Result Value Ref Range   Glucose-Capillary 81 65 - 99 mg/dL  Glucose, capillary     Status: Abnormal   Collection Time: 01/15/16 12:02 PM  Result Value Ref Range   Glucose-Capillary 139 (H) 65 - 99 mg/dL  Glucose, capillary     Status: Abnormal   Collection Time: 01/15/16  4:58 PM  Result Value Ref Range   Glucose-Capillary 341 (H) 65 - 99 mg/dL  Glucose, capillary     Status: Abnormal   Collection Time: 01/15/16  7:53 PM  Result Value Ref Range   Glucose-Capillary 214 (H) 65 - 99 mg/dL  Glucose, capillary     Status: None   Collection Time: 01/16/16  7:52 AM  Result Value Ref Range   Glucose-Capillary 72 65 - 99 mg/dL  Basic metabolic panel     Status: Abnormal   Collection Time: 01/17/16 11:15 PM  Result Value Ref Range   Sodium 135 135 - 145 mmol/L   Potassium 4.0 3.5 - 5.1 mmol/L   Chloride 101 101 - 111 mmol/L   CO2 29 22 - 32 mmol/L   Glucose, Bld 186 (H) 65 - 99 mg/dL   BUN 27 (H)  6 - 20 mg/dL   Creatinine, Ser 0.54 0.44 - 1.00 mg/dL   Calcium 9.4 8.9 - 10.3 mg/dL   GFR calc non Af Amer >60 >60 mL/min   GFR calc Af Amer >60 >60 mL/min    Comment: (NOTE) The eGFR has been calculated using the CKD EPI equation. This calculation has not been validated in all clinical situations. eGFR's persistently <60 mL/min signify possible Chronic Kidney Disease.    Anion gap 5 5 - 15  CBC     Status: Abnormal   Collection Time: 01/17/16 11:15 PM  Result Value Ref Range   WBC 4.9 4.0 - 10.5 K/uL   RBC 4.34 3.87 - 5.11 MIL/uL   Hemoglobin 8.8 (L) 12.0 - 15.0 g/dL   HCT 28.1 (L) 36.0 - 46.0 %   MCV 64.7 (L) 78.0 - 100.0 fL   MCH 20.3 (L) 26.0 - 34.0 pg   MCHC 31.3 30.0 - 36.0 g/dL   RDW 16.2 (H) 11.5 - 15.5 %   Platelets 174 150 - 400 K/uL  I-stat troponin, ED     Status: None   Collection Time: 01/17/16 11:52 PM  Result Value Ref Range   Troponin i, poc 0.00 0.00 - 0.08 ng/mL   Comment 3            Comment: Due to the release kinetics of cTnI, a negative result within the first hours of the onset of symptoms does not rule out myocardial infarction with certainty. If myocardial infarction is still suspected, repeat the test at appropriate intervals.  CBG monitoring, ED     Status: Abnormal   Collection Time: 02/25/16  3:40 PM  Result Value Ref Range   Glucose-Capillary 332 (H) 65 - 99 mg/dL  I-stat troponin, ED     Status: None   Collection Time: 02/25/16  3:52 PM  Result Value Ref Range   Troponin i, poc 0.00 0.00 - 0.08 ng/mL   Comment 3            Comment: Due to the release kinetics of cTnI, a negative result within the first hours of the onset of symptoms does not rule out myocardial infarction with certainty. If myocardial infarction is still suspected, repeat the test at appropriate intervals.   CBC with Differential/Platelet     Status: Abnormal   Collection Time: 02/25/16  4:05 PM  Result Value Ref Range   WBC 3.8 (L) 4.0 - 10.5 K/uL   RBC 4.12  3.87 - 5.11 MIL/uL   Hemoglobin 9.0 (L) 12.0 - 15.0 g/dL   HCT 28.1 (L) 36.0 - 46.0 %   MCV 68.2 (L) 78.0 - 100.0 fL   MCH 21.8 (L) 26.0 - 34.0 pg   MCHC 32.0 30.0 - 36.0 g/dL   RDW 17.7 (H) 11.5 - 15.5 %   Platelets 99 (L) 150 - 400 K/uL    Comment: REPEATED TO VERIFY SPECIMEN CHECKED FOR CLOTS PLATELET COUNT CONFIRMED BY SMEAR    Neutrophils Relative % 65 %   Lymphocytes Relative 26 %   Monocytes Relative 6 %   Eosinophils Relative 3 %   Basophils Relative 0 %   Neutro Abs 2.5 1.7 - 7.7 K/uL   Lymphs Abs 1.0 0.7 - 4.0 K/uL   Monocytes Absolute 0.2 0.1 - 1.0 K/uL   Eosinophils Absolute 0.1 0.0 - 0.7 K/uL   Basophils Absolute 0.0 0.0 - 0.1 K/uL   RBC Morphology TARGET CELLS   Comprehensive metabolic panel     Status: Abnormal   Collection Time: 02/25/16  4:05 PM  Result Value Ref Range   Sodium 140 135 - 145 mmol/L   Potassium 4.3 3.5 - 5.1 mmol/L   Chloride 103 101 - 111 mmol/L   CO2 31 22 - 32 mmol/L   Glucose, Bld 311 (H) 65 - 99 mg/dL   BUN 17 6 - 20 mg/dL   Creatinine, Ser 0.58 0.44 - 1.00 mg/dL   Calcium 8.6 (L) 8.9 - 10.3 mg/dL   Total Protein 5.6 (L) 6.5 - 8.1 g/dL   Albumin 2.8 (L) 3.5 - 5.0 g/dL   AST 23 15 - 41 U/L   ALT 25 14 - 54 U/L   Alkaline Phosphatase 77 38 - 126 U/L   Total Bilirubin 0.5 0.3 - 1.2 mg/dL   GFR calc non Af Amer >60 >60 mL/min   GFR calc Af Amer >60 >60 mL/min    Comment: (NOTE) The eGFR has been calculated using the CKD EPI equation. This calculation has not been validated in all clinical situations. eGFR's persistently <60 mL/min signify possible Chronic Kidney Disease.    Anion gap 6 5 - 15  Lipase, blood     Status: None   Collection Time: 02/25/16  4:05 PM  Result Value Ref Range   Lipase 34 11 - 51 U/L  Urinalysis, Routine w reflex microscopic (not at [facility])     Status: Abnormal   Collection Time: 02/25/16  5:11 PM  Result Value Ref Range   Color, Urine YELLOW YELLOW   APPearance CLOUDY (A) CLEAR  Specific Gravity, Urine  1.020 1.005 - 1.030   pH 7.0 5.0 - 8.0   Glucose, UA >1000 (A) NEGATIVE mg/dL   Hgb urine dipstick TRACE (A) NEGATIVE   Bilirubin Urine NEGATIVE NEGATIVE   Ketones, ur NEGATIVE NEGATIVE mg/dL   Protein, ur 30 (A) NEGATIVE mg/dL   Nitrite NEGATIVE NEGATIVE   Leukocytes, UA NEGATIVE NEGATIVE  Urine microscopic-add on     Status: Abnormal   Collection Time: 02/25/16  5:11 PM  Result Value Ref Range   Squamous Epithelial / LPF 0-5 (A) NONE SEEN   WBC, UA NONE SEEN 0 - 5 WBC/hpf   RBC / HPF 0-5 0 - 5 RBC/hpf   Bacteria, UA NONE SEEN NONE SEEN  POCT glucose (manual entry)     Status: Abnormal   Collection Time: 03/27/16  4:03 PM  Result Value Ref Range   POC Glucose 306 (A) 70 - 99 mg/dl  POCT glycosylated hemoglobin (Hb A1C)     Status: Abnormal   Collection Time: 03/27/16  4:06 PM  Result Value Ref Range   Hemoglobin A1C 6.7   Thyroid Panel With TSH     Status: Abnormal   Collection Time: 04/10/16  3:34 PM  Result Value Ref Range   T4, Total 5.3 4.5 - 12.0 ug/dL   T3 Uptake 38 (H) 22 - 35 %   Free Thyroxine Index 2.0 1.4 - 3.8   TSH 2.02 mIU/L    Comment:   Reference Range   > or = 20 Years  0.40-4.50   Pregnancy Range First trimester  0.26-2.66 Second trimester 0.55-2.73 Third trimester  0.43-2.91     Lipid panel     Status: Abnormal   Collection Time: 04/10/16  3:34 PM  Result Value Ref Range   Cholesterol 151 0 - 200 mg/dL    Comment: ATP III Classification       Desirable:  < 200 mg/dL               Borderline High:  200 - 239 mg/dL          High:  > = 240 mg/dL   Triglycerides 156.0 (H) 0.0 - 149.0 mg/dL    Comment: Normal:  <150 mg/dLBorderline High:  150 - 199 mg/dL   HDL 83.30 >39.00 mg/dL   VLDL 31.2 0.0 - 40.0 mg/dL   LDL Cholesterol 36 0 - 99 mg/dL   Total CHOL/HDL Ratio 2     Comment:                Men          Women1/2 Average Risk     3.4          3.3Average Risk          5.0          4.42X Average Risk          9.6          7.13X Average Risk           15.0          11.0                       NonHDL 67.43     Comment: NOTE:  Non-HDL goal should be 30 mg/dL higher than patient's LDL goal (i.e. LDL goal of < 70 mg/dL, would have non-HDL goal of < 100 mg/dL)  Hepatitis C Antibody     Status: None  Collection Time: 04/10/16  3:34 PM  Result Value Ref Range   HCV Ab NEGATIVE NEGATIVE   Follow up: Return in about 3 months (around 07/11/2016) for HTN and thyroid nodule, Needs urine miroalbumin done.Wilfred Lacy, NP

## 2016-04-11 LAB — THYROID PANEL WITH TSH
FREE THYROXINE INDEX: 2 (ref 1.4–3.8)
T3 UPTAKE: 38 % — AB (ref 22–35)
T4 TOTAL: 5.3 ug/dL (ref 4.5–12.0)
TSH: 2.02 m[IU]/L

## 2016-04-11 LAB — HEPATITIS C ANTIBODY: HCV AB: NEGATIVE

## 2016-04-13 ENCOUNTER — Telehealth: Payer: Self-pay | Admitting: Nurse Practitioner

## 2016-04-13 ENCOUNTER — Other Ambulatory Visit: Payer: Self-pay | Admitting: Infectious Disease

## 2016-04-13 ENCOUNTER — Telehealth: Payer: Self-pay | Admitting: Internal Medicine

## 2016-04-13 ENCOUNTER — Ambulatory Visit
Admission: RE | Admit: 2016-04-13 | Discharge: 2016-04-13 | Disposition: A | Discharge status: Home / Self Care | Payer: No Typology Code available for payment source | Source: Ambulatory Visit | Attending: Infectious Disease | Admitting: Infectious Disease

## 2016-04-13 ENCOUNTER — Other Ambulatory Visit: Payer: Self-pay | Admitting: Nurse Practitioner

## 2016-04-13 DIAGNOSIS — Z09 Encounter for follow-up examination after completed treatment for conditions other than malignant neoplasm: Secondary | ICD-10-CM

## 2016-04-13 DIAGNOSIS — E041 Nontoxic single thyroid nodule: Secondary | ICD-10-CM | POA: Insufficient documentation

## 2016-04-13 DIAGNOSIS — Z780 Asymptomatic menopausal state: Secondary | ICD-10-CM | POA: Insufficient documentation

## 2016-04-13 NOTE — Assessment & Plan Note (Addendum)
Stable. CBC Latest Ref Rng & Units 02/25/2016 01/17/2016 01/13/2016  WBC 4.0 - 10.5 K/uL 3.8(L) 4.9 6.2  Hemoglobin 12.0 - 15.0 g/dL 9.0(L) 8.8(L) 8.2(L)  Hematocrit 36.0 - 46.0 % 28.1(L) 28.1(L) 26.6(L)  Platelets 150 - 400 K/uL 99(L) 174 124(L)

## 2016-04-13 NOTE — Assessment & Plan Note (Addendum)
Normal thyroid panel. Repeat next year Pending thyroid [place]

## 2016-04-13 NOTE — Assessment & Plan Note (Signed)
BP controlled with medication at this time.

## 2016-04-13 NOTE — Assessment & Plan Note (Signed)
Managed by endocrinologist: Olivia Williamson, last seen 03/2016. HgbA1c of 6.7 done 03/2016 with use of Lantus 25units at bedtime. No oral agents. Current use of gabapentin 600mg  TID for neuropathy.

## 2016-04-13 NOTE — Telephone Encounter (Signed)
[name] and [facility]  States Pt has active TB Requesting to speak to nurse

## 2016-04-13 NOTE — Assessment & Plan Note (Signed)
Ongoing treatment with isoniazid, rifampin, and pyrazinamide. Supplement with Vitamin B6. [facility] Treatment started 01/2016

## 2016-04-14 ENCOUNTER — Ambulatory Visit: Payer: Self-pay | Admitting: Internal Medicine

## 2016-04-14 NOTE — Telephone Encounter (Signed)
When would you want to see patient. Pt was seen with [facility] 04/10/2016. Spoke with [name] regarding pt and which provider she wanted to see Per renee she stated that pt wants to stick with Dr. Julien NordmannLangeland because she is prescribing her insulin and medications. Pt will be needing an appointment soon for a refill of medication

## 2016-04-20 NOTE — Telephone Encounter (Signed)
Please see if can fit in appt soon in schedule.  Double book?   Thanks

## 2016-04-21 ENCOUNTER — Emergency Department (HOSPITAL_COMMUNITY)
Admission: EM | Admit: 2016-04-21 | Discharge: 2016-04-21 | Disposition: A | Discharge status: Home / Self Care | Payer: Medicare Other | Attending: Emergency Medicine | Admitting: Emergency Medicine

## 2016-04-21 ENCOUNTER — Emergency Department (HOSPITAL_COMMUNITY): Payer: Medicare Other

## 2016-04-21 ENCOUNTER — Encounter (HOSPITAL_COMMUNITY): Payer: Self-pay

## 2016-04-21 DIAGNOSIS — E871 Hypo-osmolality and hyponatremia: Secondary | ICD-10-CM | POA: Diagnosis not present

## 2016-04-21 DIAGNOSIS — M546 Pain in thoracic spine: Secondary | ICD-10-CM

## 2016-04-21 DIAGNOSIS — Z794 Long term (current) use of insulin: Secondary | ICD-10-CM | POA: Insufficient documentation

## 2016-04-21 DIAGNOSIS — R1084 Generalized abdominal pain: Secondary | ICD-10-CM | POA: Diagnosis not present

## 2016-04-21 DIAGNOSIS — E1149 Type 2 diabetes mellitus with other diabetic neurological complication: Secondary | ICD-10-CM | POA: Diagnosis not present

## 2016-04-21 DIAGNOSIS — R079 Chest pain, unspecified: Secondary | ICD-10-CM | POA: Diagnosis present

## 2016-04-21 DIAGNOSIS — Z79899 Other long term (current) drug therapy: Secondary | ICD-10-CM | POA: Insufficient documentation

## 2016-04-21 DIAGNOSIS — I10 Essential (primary) hypertension: Secondary | ICD-10-CM | POA: Insufficient documentation

## 2016-04-21 LAB — BASIC METABOLIC PANEL
ANION GAP: 9 (ref 5–15)
BUN: 21 mg/dL — ABNORMAL HIGH (ref 6–20)
CO2: 28 mmol/L (ref 22–32)
Calcium: 9.2 mg/dL (ref 8.9–10.3)
Chloride: 87 mmol/L — ABNORMAL LOW (ref 101–111)
Creatinine, Ser: 0.7 mg/dL (ref 0.44–1.00)
GFR calc Af Amer: >60 mL/min (ref >60)
GLUCOSE: 233 mg/dL — AB (ref 65–99)
POTASSIUM: 3.6 mmol/L (ref 3.5–5.1)
Sodium: 124 mmol/L — ABNORMAL LOW (ref 135–145)

## 2016-04-21 LAB — CBC
HEMATOCRIT: 29.5 % — AB (ref 36.0–46.0)
HEMOGLOBIN: 10.1 g/dL — AB (ref 12.0–15.0)
MCH: 22.9 pg — ABNORMAL LOW (ref 26.0–34.0)
MCHC: 34.2 g/dL (ref 30.0–36.0)
MCV: 66.9 fL — ABNORMAL LOW (ref 78.0–100.0)
Platelets: 116 10*3/uL — ABNORMAL LOW (ref 150–400)
RBC: 4.41 MIL/uL (ref 3.87–5.11)
RDW: 15.5 % (ref 11.5–15.5)
WBC: 4 10*3/uL (ref 4.0–10.5)

## 2016-04-21 LAB — I-STAT TROPONIN, ED: Troponin i, poc: 0 ng/mL (ref 0.00–0.08)

## 2016-04-21 LAB — BRAIN NATRIURETIC PEPTIDE: B NATRIURETIC PEPTIDE 5: 18.8 pg/mL (ref 0.0–100.0)

## 2016-04-21 MED ORDER — HYDROCODONE-ACETAMINOPHEN 5-325 MG PO TABS: 0.5000 | ORAL_TABLET | ORAL | 0 refills | Status: DC | PRN

## 2016-04-21 MED ORDER — IOPAMIDOL (ISOVUE-370) INJECTION 76%
INTRAVENOUS | Status: AC
  Administered 2016-04-21: 100 mL
  Filled 2016-04-21: qty 100

## 2016-04-21 MED ORDER — HYDROCODONE-ACETAMINOPHEN 5-325 MG PO TABS
1.0000 | ORAL_TABLET | Freq: Once | ORAL | Status: AC
  Administered 2016-04-21: 1 via ORAL
  Filled 2016-04-21: qty 1

## 2016-04-21 NOTE — Telephone Encounter (Signed)
Will forward to chadell

## 2016-04-21 NOTE — ED Provider Notes (Signed)
[facility] DEPT Provider Note   CSN: [id number] Arrival date & time: 04/21/16  0033  By signing my name below, I, Olivia Williamson, attest that this documentation has been prepared under the direction and in the presence of Olivia Greek, MD. Electronically Signed: [name], ED Scribe. 04/21/16. 3:42 AM.   History   Chief Complaint Chief Complaint  Patient presents with  . Chest Pain  . Shortness of Breath   The history is provided by the patient and a relative. No language interpreter was used.   HPI Comments: Olivia Williamson is a 70 y.o. female with PMHx of GERD, HTN, DM and Tuberculosis who presents to the Emergency Department complaining of gradual onset, constant, moderate right sided chest pain onset yesterday afternoon. Chest pain radiates to abdomen and lower back. Son reports associated shortness of breath, lower back pain, lightheadedness and dizziness. Lower back pain and abdominal pain are similar to a burning sensation. Pt was informed by her PCP that her X-ray was clear one week ago.  Past Medical History:  Diagnosis Date  . Arthritis   . Diabetes mellitus without complication ([place])    stopped DM meds 2004  . GERD (gastroesophageal reflux disease)   . Hypertension    meds stopped 1 week ago , need to get renewed  . Tuberculosis    ACTIVE   Patient Active Problem List   Diagnosis Date Noted  . Thyroid nodule 04/13/2016  . Postmenopausal status 04/13/2016  . Chest pain   . Abnormal CT of the chest   . Tuberculosis 01/09/2016  . Protein-calorie malnutrition, severe 12/16/2015  . Generalized weakness 12/15/2015  . Anemia 12/15/2015  . Hyperglycemia 12/15/2015  . Nausea without vomiting 12/15/2015  . Hypertension   . GERD (gastroesophageal reflux disease)   . Diabetes mellitus with neurological manifestations [facility])     Past Surgical History:  Procedure Laterality Date  . ABDOMINAL HYSTERECTOMY    . CHOLECYSTECTOMY N/A 05/02/2014   Procedure:  LAPAROSCOPIC CHOLECYSTECTOMY ;  Surgeon: Coralie Keens, MD;  Location: [place];  Service: General;  Laterality: N/A;  laparoscopic cholecystectomy  . COLONOSCOPY    . NO PAST SURGERIES    . VIDEO BRONCHOSCOPY Bilateral 01/10/2016   Procedure: VIDEO BRONCHOSCOPY WITHOUT FLUORO;  Surgeon: Collene Gobble, MD;  Location: [place];  Service: Cardiopulmonary;  Laterality: Bilateral;    OB History    No data available       Home Medications    Prior to Admission medications   Medication Sig Start Date End Date Taking? Authorizing Provider  blood glucose meter kit and supplies KIT Dispense based on patient and insurance preference. Use up to four times daily as directed. (FOR ICD-9 250.00, 250.01). 12/16/15  Yes Silver Huguenin Elgergawy, MD  feeding supplement, GLUCERNA SHAKE, (GLUCERNA SHAKE) LIQD Take 237 mLs by mouth 2 (two) times daily between meals. 12/16/15  Yes Albertine Patricia, MD  Ferrous Gluconate 324 (37.5 Fe) MG TABS Take 1 tablet (324 mg total) by mouth daily. 12/30/15  Yes Maren Reamer, MD  gabapentin (NEURONTIN) 300 MG capsule Take 2 capsules (600 mg total) by mouth 3 (three) times daily. 12/30/15  Yes Maren Reamer, MD  glucose blood test strip Use as instructed 12/30/15  Yes Maren Reamer, MD  Insulin Glargine (LANTUS SOLOSTAR) 100 UNIT/ML Solostar Pen Inject 25 Units into the skin daily at 10 pm. 03/27/16  Yes Argentina Donovan, PA-C  Insulin Pen Needle (ULTICARE MICRO PEN NEEDLES) 32G X 4 MM MISC  1 applicator by Does not apply route at bedtime. 12/30/15  Yes Maren Reamer, MD  isoniazid (NYDRAZID) 300 MG tablet Take 300 mg by mouth daily.    Yes Historical Provider, MD  Lancet Devices [facility]) lancets Use as instructed 12/30/15  Yes Maren Reamer, MD  pyrazinamide 500 MG tablet Take 1,000 mg by mouth daily.   Yes Historical Provider, MD  pyridOXINE (VITAMIN B-6) 50 MG tablet Take 50 mg by mouth daily.   Yes Historical Provider, MD  rifampin (RIFADIN) 150 MG  capsule Take 150 mg by mouth daily.   Yes Historical Provider, MD  saccharomyces boulardii (FLORASTOR) 250 MG capsule Take 1 capsule (250 mg total) by mouth 2 (two) times daily. Any probiotic that is covered 01/16/16  Yes Thurnell Lose, MD  traMADol (ULTRAM) 50 MG tablet Take 1 tablet (50 mg total) by mouth every 6 (six) hours as needed. Patient taking differently: Take 50 mg by mouth every 6 (six) hours as needed for moderate pain.  02/25/16  Yes Milton Ferguson, MD  Vitamin D, Ergocalciferol, (DRISDOL) 50000 units CAPS capsule Take 1 capsule (50,000 Units total) by mouth every 7 (seven) days. 12/31/15  Yes Maren Reamer, MD  HYDROcodone-acetaminophen (NORCO/VICODIN) 5-325 MG tablet Take 0.5 tablets by mouth every 4 (four) hours as needed for moderate pain. 04/21/16   Olivia Greek, MD    Family History Family History  Problem Relation Age of Onset  . Heart disease Maternal Uncle 70    Social History Social History  Substance Use Topics  . Smoking status: Never Smoker  . Smokeless tobacco: Never Used  . Alcohol use No     Allergies   Beef-derived products; Eggs or egg-derived products; Fish allergy; and Fruit & vegetable daily [nutritional supplements]   Review of Systems Review of Systems  Respiratory: Positive for shortness of breath.   Cardiovascular: Positive for chest pain.  Musculoskeletal: Positive for back pain.  Neurological: Positive for dizziness and light-headedness.  All other systems reviewed and are negative.    Physical Exam Updated Vital Signs BP 137/70   Pulse 78   Temp 97.3 F (36.3 C) (Oral)   Resp 12   SpO2 100%   Physical Exam  Constitutional: She is oriented to person, place, and time. She appears well-developed and well-nourished. No distress.  HENT:  Head: Normocephalic and atraumatic.  Right Ear: Hearing normal.  Left Ear: Hearing normal.  Nose: Nose normal.  Mouth/Throat: Oropharynx is clear and moist and mucous membranes are  normal.  Eyes: Conjunctivae and EOM are normal. Pupils are equal, round, and reactive to light.  Neck: Normal range of motion. Neck supple.  Cardiovascular: Regular rhythm, S1 normal and S2 normal.  Exam reveals no gallop and no friction rub.   No murmur heard. Pulmonary/Chest: Effort normal and breath sounds normal. No respiratory distress. She exhibits no tenderness.  Abdominal: Soft. Normal appearance and bowel sounds are normal. There is no hepatosplenomegaly. There is no tenderness. There is no rebound, no guarding, no tenderness at McBurney's point and negative Murphy's sign. No hernia.  Musculoskeletal: Normal range of motion.  Neurological: She is alert and oriented to person, place, and time. She has normal strength. No cranial nerve deficit or sensory deficit. Coordination normal. GCS eye subscore is 4. GCS verbal subscore is 5. GCS motor subscore is 6.  Skin: Skin is warm, dry and intact. No rash noted. No cyanosis.  Psychiatric: She has a normal mood and affect. Her speech is  normal and behavior is normal. Thought content normal.  Nursing note and vitals reviewed.   ED Treatments / Results  DIAGNOSTIC STUDIES: Oxygen Saturation is 99% on RA, normal by my interpretation.    COORDINATION OF CARE: 3:41 AM Discussed treatment plan with pt at bedside which includes Cardiac monitoring and pt agreed to plan.  Labs (all labs ordered are listed, but only abnormal results are displayed) Labs Reviewed  BASIC METABOLIC PANEL - Abnormal; Notable for the following:       Result Value   Sodium 124 (*)    Chloride 87 (*)    Glucose, Bld 233 (*)    BUN 21 (*)    All other components within normal limits  CBC - Abnormal; Notable for the following:    Hemoglobin 10.1 (*)    HCT 29.5 (*)    MCV 66.9 (*)    MCH 22.9 (*)    Platelets 116 (*)    All other components within normal limits  BRAIN NATRIURETIC PEPTIDE  I-STAT TROPOININ, ED    EKG  EKG Interpretation  Date/Time:  Tuesday  April 21 2016 00:42:20 EST Ventricular Rate:  89 PR Interval:  146 QRS Duration: 72 QT Interval:  364 QTC Calculation: 442 R Axis:   12 Text Interpretation:  Normal sinus rhythm Normal ECG Confirmed by POLLINA  MD, CHRISTOPHER [phone]) on 04/21/2016 3:18:36 AM       Radiology Dg Chest 2 View  Result Date: 04/21/2016 CLINICAL DATA:  Chest pain and shortness of breath. Lightheaded and dizziness. History of TB. EXAM: CHEST  2 VIEW COMPARISON:  Chest 04/13/2016.  CT chest 02/25/2016 FINDINGS: Normal heart size and pulmonary vascularity. Nodularity in the right lung base is unchanged since prior studies. No developing infiltration or atelectasis. No blunting of costophrenic angles. No pneumothorax. Mediastinal contours appear intact. Calcification of the aorta. IMPRESSION: No evidence of active pulmonary disease. Nodularity in the right lung base is unchanged since prior studies. Electronically Signed   By: Lucienne Capers M.D.   On: 04/21/2016 01:28   Ct Angio Chest/abd/pel For Dissection W And/or Wo Contrast  Result Date: 04/21/2016 CLINICAL DATA:  Gradual onset moderate right chest pain with onset yesterday afternoon. Pain radiates to abdomen and lower back. Shortness of breath, lightheadedness, and dizziness. History of hypertension, diabetes, TB, hysterectomy, cholecystectomy. EXAM: CT ANGIOGRAPHY CHEST, ABDOMEN AND PELVIS TECHNIQUE: Multidetector CT imaging through the chest, abdomen and pelvis was performed using the standard protocol during bolus administration of intravenous contrast. Multiplanar reconstructed images and MIPs were obtained and reviewed to evaluate the vascular anatomy. CONTRAST:  75 mL Isovue 370 COMPARISON:  02/25/2016 FINDINGS: CTA CHEST FINDINGS Cardiovascular: Noncontrast CT images of the chest demonstrate mild scattered aortic calcification. No evidence of intramural hematoma. Images obtained after contrast administration in the arterial phase demonstrate normal  caliber thoracic aorta. No aortic aneurysm or dissection. Great vessel origins are patent. Central pulmonary arteries are well opacified without evidence of significant pulmonary embolus. Mildly dilated main pulmonary artery may suggest pulmonary arterial hypertension. Normal heart size. No pericardial effusion. Mediastinum/Nodes: 2 cm nodule in the left thyroid gland. No change since prior study. No significant lymphadenopathy in the chest. Esophagus is decompressed. Lungs/Pleura: Mild dependent changes in the lung bases. Nodular infiltrates seen previously in the left upper lung are improving. Mild residual nodular infiltrative change in the superior segment of the left upper lung, decreasing since previous study suggesting inflammatory process. Persistent focal nodularity and scarring in the right lung base with solid nodule  measuring about 14 mm diameter, unchanged since previous study. Adjacent cavitary nodule measuring 9 mm diameter. The cavity appears smaller than on previous study, possibly representing a resolving inflammatory focus. Continued surveillance of this area is recommended with 3-6 month follow-up study suggested. No focal consolidation. Calcified granulomas in the right lung. Musculoskeletal: No chest wall abnormality. No acute or significant osseous findings. Review of the MIP images confirms the above findings. CTA ABDOMEN AND PELVIS FINDINGS VASCULAR Aorta: Normal caliber aorta without aneurysm, dissection, vasculitis or significant stenosis. Celiac: Patent without evidence of aneurysm, dissection, vasculitis or significant stenosis. SMA: Patent without evidence of aneurysm, dissection, vasculitis or significant stenosis. Renals: Both renal arteries are patent without evidence of aneurysm, dissection, vasculitis, fibromuscular dysplasia or significant stenosis. IMA: Patent without evidence of aneurysm, dissection, vasculitis or significant stenosis. Inflow: Patent without evidence of  aneurysm, dissection, vasculitis or significant stenosis. Veins: No obvious venous abnormality within the limitations of this arterial phase study. Review of the MIP images confirms the above findings. NON-VASCULAR Hepatobiliary: Diffuse fatty infiltration of the liver. Scattered sub cm nodules demonstrated throughout the liver are too small to characterize but likely represent small cysts or hemangiomas. No change since prior study. Gallbladder is surgically absent. No bile duct dilatation. Pancreas: Unremarkable. No pancreatic ductal dilatation or surrounding inflammatory changes. Spleen: Normal in size without focal abnormality. Adrenals/Urinary Tract: Adrenal glands are unremarkable. Kidneys are normal, without renal calculi, focal lesion, or hydronephrosis. Bladder is unremarkable. Stomach/Bowel: Stomach is within normal limits. Appendix appears normal. No evidence of bowel wall thickening, distention, or inflammatory changes. Lymphatic: No significant vascular findings are present. No enlarged abdominal or pelvic lymph nodes. Reproductive: Calcifications in the uterus are likely vascular. Uterus and ovaries are not enlarged. Small amount of free fluid in the pelvis is nonspecific possibly inflammatory. Other: No free air in the abdomen. Abdominal wall musculature appears intact. Musculoskeletal: No acute or significant osseous findings. Calcification in the subcutaneous fat over the right gluteal region probably represents injection granuloma. Review of the MIP images confirms the above findings. IMPRESSION: No evidence of aneurysm or dissection in the thoracic or abdominal aorta. Improving inflammatory infiltrates in the left lung. Soft tissue nodule and cavitary nodule in the right lower lung again noted. Soft tissue nodule is unchanged. Cavitary nodule appears slightly smaller. This may indicate inflammatory etiology. Neoplasm not entirely excluded. Follow-up in 3-6 months recommended. Diffuse fatty  infiltration of the liver. Electronically Signed   By: Lucienne Capers M.D.   On: 04/21/2016 05:04    Procedures Procedures (including critical care time)  Medications Ordered in ED Medications  iopamidol (ISOVUE-370) 76 % injection (100 mLs  Contrast Given 04/21/16 0425)     Initial Impression / Assessment and Plan / ED Course  I have reviewed the triage vital signs and the nursing notes.  Pertinent labs & imaging results that were available during my care of the patient were reviewed by me and considered in my medical decision making (see chart for details).  Clinical Course   Patient presented to the ER for evaluation of chest pain, back pain, abdominal pain. Patient was recently evaluated for a lesion in the chest, cavitary in nature. She was being treated for possibility of tuberculosis. Her repeat evaluation last week showed no change, was told that she did not have any further worries. She had been experiencing intermittent similar symptoms of pain during that period of time as well. Source of the pain is unclear at this time.  Pain does not seem  consistent with cardiac etiology. Cardiac evaluation here negative. Because the pain traveled from back to [place] abdomen, aortic dissection was considered. CT angiography was performed and no acute pathology was noted in the chest, abdomen or pelvis.  Lab work was normal except for mild hyponatremia. Sodium 124 today. She has been low in the past, but around 1:30. This will need to be rechecked. Follow up with primary doctor.  I personally performed the services described in this documentation, which was scribed in my presence. The recorded information has been reviewed and is accurate.  Final Clinical Impressions(s) / ED Diagnoses   Final diagnoses:  Acute bilateral thoracic back pain  Generalized abdominal pain  Hyponatremia    New Prescriptions New Prescriptions   HYDROCODONE-ACETAMINOPHEN (NORCO/VICODIN) 5-325 MG TABLET     Take 0.5 tablets by mouth every 4 (four) hours as needed for moderate pain.     Olivia Greek, MD 04/21/16 0600

## 2016-04-21 NOTE — Telephone Encounter (Signed)
Will forward to [place]

## 2016-04-21 NOTE — ED Triage Notes (Signed)
Pt complaining of chest pain and SOB. Pt states pain radiates across whole back.  Pt also complaining of light headed and dizziness.

## 2016-04-24 ENCOUNTER — Encounter: Payer: Self-pay | Admitting: Internal Medicine

## 2016-04-27 ENCOUNTER — Other Ambulatory Visit: Payer: Self-pay | Admitting: Nurse Practitioner

## 2016-04-27 DIAGNOSIS — E041 Nontoxic single thyroid nodule: Secondary | ICD-10-CM

## 2016-04-29 ENCOUNTER — Telehealth: Payer: Self-pay | Admitting: General Practice

## 2016-04-29 NOTE — Telephone Encounter (Signed)
Please call nurse [name], she has concern about patient. Please follow up.  [phone]

## 2016-04-29 NOTE — Telephone Encounter (Signed)
Will forward to pcp

## 2016-04-30 NOTE — Telephone Encounter (Signed)
dw FarnhamRenee on call back, updated me on current progress., pt wants to continue using our clinic for pcp care.  - she will work on getting pt appt - she asked if she could be called back prior to her arrival. - pt on inh, rifampin, b6 - 3x week directly observed by rn. - pt had Concerns for thyroid nodule, back pains, recent ED visit (came if w/ back pain, but documented for chest pain)

## 2016-04-30 NOTE — Telephone Encounter (Signed)
Called [name] Carpenter// left vm

## 2016-05-11 ENCOUNTER — Inpatient Hospital Stay (HOSPITAL_COMMUNITY)
Admission: EM | Admit: 2016-05-11 | Discharge: 2016-05-15 | DRG: 640 | Disposition: A | Discharge status: Home Health | Payer: Medicare Other | Attending: Internal Medicine | Admitting: Internal Medicine

## 2016-05-11 ENCOUNTER — Observation Stay (HOSPITAL_COMMUNITY): Payer: Medicare Other

## 2016-05-11 ENCOUNTER — Encounter (HOSPITAL_COMMUNITY): Payer: Self-pay | Admitting: Emergency Medicine

## 2016-05-11 ENCOUNTER — Emergency Department (HOSPITAL_COMMUNITY): Payer: Medicare Other

## 2016-05-11 DIAGNOSIS — R519 Headache, unspecified: Secondary | ICD-10-CM

## 2016-05-11 DIAGNOSIS — R51 Headache: Secondary | ICD-10-CM

## 2016-05-11 DIAGNOSIS — E11649 Type 2 diabetes mellitus with hypoglycemia without coma: Secondary | ICD-10-CM | POA: Diagnosis not present

## 2016-05-11 DIAGNOSIS — R531 Weakness: Secondary | ICD-10-CM

## 2016-05-11 DIAGNOSIS — R627 Adult failure to thrive: Secondary | ICD-10-CM | POA: Diagnosis present

## 2016-05-11 DIAGNOSIS — I951 Orthostatic hypotension: Secondary | ICD-10-CM | POA: Diagnosis not present

## 2016-05-11 DIAGNOSIS — Z9049 Acquired absence of other specified parts of digestive tract: Secondary | ICD-10-CM | POA: Diagnosis not present

## 2016-05-11 DIAGNOSIS — D61818 Other pancytopenia: Secondary | ICD-10-CM

## 2016-05-11 DIAGNOSIS — E041 Nontoxic single thyroid nodule: Secondary | ICD-10-CM | POA: Diagnosis present

## 2016-05-11 DIAGNOSIS — Z681 Body mass index (BMI) 19 or less, adult: Secondary | ICD-10-CM | POA: Diagnosis not present

## 2016-05-11 DIAGNOSIS — I10 Essential (primary) hypertension: Secondary | ICD-10-CM

## 2016-05-11 DIAGNOSIS — Z8611 Personal history of tuberculosis: Secondary | ICD-10-CM | POA: Diagnosis not present

## 2016-05-11 DIAGNOSIS — Z90710 Acquired absence of both cervix and uterus: Secondary | ICD-10-CM

## 2016-05-11 DIAGNOSIS — E871 Hypo-osmolality and hyponatremia: Secondary | ICD-10-CM | POA: Diagnosis not present

## 2016-05-11 DIAGNOSIS — M79651 Pain in right thigh: Secondary | ICD-10-CM | POA: Diagnosis present

## 2016-05-11 DIAGNOSIS — E1140 Type 2 diabetes mellitus with diabetic neuropathy, unspecified: Secondary | ICD-10-CM | POA: Diagnosis not present

## 2016-05-11 DIAGNOSIS — Z8249 Family history of ischemic heart disease and other diseases of the circulatory system: Secondary | ICD-10-CM

## 2016-05-11 DIAGNOSIS — Z91013 Allergy to seafood: Secondary | ICD-10-CM | POA: Diagnosis not present

## 2016-05-11 DIAGNOSIS — M79652 Pain in left thigh: Secondary | ICD-10-CM | POA: Diagnosis not present

## 2016-05-11 DIAGNOSIS — Z91018 Allergy to other foods: Secondary | ICD-10-CM | POA: Diagnosis not present

## 2016-05-11 DIAGNOSIS — Z794 Long term (current) use of insulin: Secondary | ICD-10-CM

## 2016-05-11 DIAGNOSIS — E43 Unspecified severe protein-calorie malnutrition: Secondary | ICD-10-CM

## 2016-05-11 DIAGNOSIS — A159 Respiratory tuberculosis unspecified: Secondary | ICD-10-CM | POA: Diagnosis not present

## 2016-05-11 DIAGNOSIS — K219 Gastro-esophageal reflux disease without esophagitis: Secondary | ICD-10-CM | POA: Diagnosis present

## 2016-05-11 DIAGNOSIS — Z91012 Allergy to eggs: Secondary | ICD-10-CM

## 2016-05-11 DIAGNOSIS — E1149 Type 2 diabetes mellitus with other diabetic neurological complication: Secondary | ICD-10-CM | POA: Diagnosis present

## 2016-05-11 DIAGNOSIS — R109 Unspecified abdominal pain: Secondary | ICD-10-CM | POA: Diagnosis not present

## 2016-05-11 DIAGNOSIS — E1142 Type 2 diabetes mellitus with diabetic polyneuropathy: Secondary | ICD-10-CM | POA: Diagnosis not present

## 2016-05-11 DIAGNOSIS — E860 Dehydration: Secondary | ICD-10-CM | POA: Diagnosis not present

## 2016-05-11 DIAGNOSIS — Z79899 Other long term (current) drug therapy: Secondary | ICD-10-CM

## 2016-05-11 LAB — I-STAT TROPONIN, ED: Troponin i, poc: 0 ng/mL (ref 0.00–0.08)

## 2016-05-11 LAB — CBC WITH DIFFERENTIAL/PLATELET
Basophils Absolute: 0 10*3/uL (ref 0.0–0.1)
Basophils Relative: 0 %
Eosinophils Absolute: 0.1 10*3/uL (ref 0.0–0.7)
Eosinophils Relative: 2 %
HCT: 26.6 % — ABNORMAL LOW (ref 36.0–46.0)
Hemoglobin: 9.2 g/dL — ABNORMAL LOW (ref 12.0–15.0)
Lymphocytes Relative: 27 %
Lymphs Abs: 0.8 10*3/uL (ref 0.7–4.0)
MCH: 22.8 pg — ABNORMAL LOW (ref 26.0–34.0)
MCHC: 34.6 g/dL (ref 30.0–36.0)
MCV: 65.8 fL — ABNORMAL LOW (ref 78.0–100.0)
Monocytes Absolute: 0.3 10*3/uL (ref 0.1–1.0)
Monocytes Relative: 9 %
Neutro Abs: 1.9 10*3/uL (ref 1.7–7.7)
Neutrophils Relative %: 62 %
Platelets: 102 10*3/uL — ABNORMAL LOW (ref 150–400)
RBC: 4.04 MIL/uL (ref 3.87–5.11)
RDW: 14.2 % (ref 11.5–15.5)
WBC: 3.1 10*3/uL — ABNORMAL LOW (ref 4.0–10.5)

## 2016-05-11 LAB — COMPREHENSIVE METABOLIC PANEL
ALT: 25 U/L (ref 14–54)
AST: 26 U/L (ref 15–41)
Albumin: 3.1 g/dL — ABNORMAL LOW (ref 3.5–5.0)
Alkaline Phosphatase: 58 U/L (ref 38–126)
Anion gap: 5 (ref 5–15)
BUN: 12 mg/dL (ref 6–20)
CO2: 29 mmol/L (ref 22–32)
Calcium: 9.1 mg/dL (ref 8.9–10.3)
Chloride: 96 mmol/L — ABNORMAL LOW (ref 101–111)
Creatinine, Ser: 0.68 mg/dL (ref 0.44–1.00)
GFR calc Af Amer: >60 mL/min (ref >60)
GFR calc non Af Amer: >60 mL/min (ref >60)
Glucose, Bld: 198 mg/dL — ABNORMAL HIGH (ref 65–99)
Potassium: 3.5 mmol/L (ref 3.5–5.1)
Sodium: 130 mmol/L — ABNORMAL LOW (ref 135–145)
Total Bilirubin: 0.7 mg/dL (ref 0.3–1.2)
Total Protein: 5.7 g/dL — ABNORMAL LOW (ref 6.5–8.1)

## 2016-05-11 LAB — URINALYSIS, ROUTINE W REFLEX MICROSCOPIC
Bilirubin Urine: NEGATIVE
Glucose, UA: 150 mg/dL — AB
Hgb urine dipstick: NEGATIVE
Ketones, ur: NEGATIVE mg/dL
Leukocytes, UA: NEGATIVE
Nitrite: NEGATIVE
Protein, ur: NEGATIVE mg/dL
Specific Gravity, Urine: 1.003 — ABNORMAL LOW (ref 1.005–1.030)
pH: 6 (ref 5.0–8.0)

## 2016-05-11 LAB — PROCALCITONIN: Procalcitonin: <0.1 ng/mL

## 2016-05-11 LAB — GLUCOSE, CAPILLARY: GLUCOSE-CAPILLARY: 71 mg/dL (ref 65–99)

## 2016-05-11 MED ORDER — ENOXAPARIN SODIUM 40 MG/0.4ML ~~LOC~~ SOLN: 40.0000 mg | SUBCUTANEOUS | Status: DC

## 2016-05-11 MED ORDER — ENOXAPARIN SODIUM 30 MG/0.3ML ~~LOC~~ SOLN
30.0000 mg | SUBCUTANEOUS | Status: DC
  Administered 2016-05-11 – 2016-05-14 (×4): 30 mg via SUBCUTANEOUS
  Filled 2016-05-11 (×4): qty 0.3

## 2016-05-11 MED ORDER — INSULIN ASPART 100 UNIT/ML ~~LOC~~ SOLN
0.0000 [IU] | Freq: Three times a day (TID) | SUBCUTANEOUS | Status: DC
  Administered 2016-05-12: 3 [IU] via SUBCUTANEOUS
  Administered 2016-05-12: 2 [IU] via SUBCUTANEOUS
  Administered 2016-05-13: 7 [IU] via SUBCUTANEOUS
  Administered 2016-05-13: 3 [IU] via SUBCUTANEOUS
  Administered 2016-05-13: 2 [IU] via SUBCUTANEOUS
  Administered 2016-05-14: 3 [IU] via SUBCUTANEOUS
  Administered 2016-05-14: 7 [IU] via SUBCUTANEOUS
  Administered 2016-05-14: 3 [IU] via SUBCUTANEOUS
  Administered 2016-05-15: 5 [IU] via SUBCUTANEOUS
  Administered 2016-05-15: 2 [IU] via SUBCUTANEOUS

## 2016-05-11 MED ORDER — SODIUM CHLORIDE 0.9 % IV BOLUS (SEPSIS)
500.0000 mL | Freq: Once | INTRAVENOUS | Status: AC
  Administered 2016-05-11: 500 mL via INTRAVENOUS

## 2016-05-11 MED ORDER — DEXTROSE 5 % IV SOLN
1.0000 g | Freq: Once | INTRAVENOUS | Status: AC
  Administered 2016-05-11: 1 g via INTRAVENOUS
  Filled 2016-05-11: qty 10

## 2016-05-11 MED ORDER — POTASSIUM CHLORIDE IN NACL 40-0.9 MEQ/L-% IV SOLN
INTRAVENOUS | Status: AC
  Administered 2016-05-12 (×2): 125 mL/h via INTRAVENOUS
  Filled 2016-05-11 (×3): qty 1000

## 2016-05-11 MED ORDER — M.V.I. ADULT IV INJ
INJECTION | Freq: Once | INTRAVENOUS | Status: AC
  Administered 2016-05-11: 21:00:00 via INTRAVENOUS
  Filled 2016-05-11: qty 1000

## 2016-05-11 MED ORDER — ACETAMINOPHEN 325 MG PO TABS
650.0000 mg | ORAL_TABLET | Freq: Four times a day (QID) | ORAL | Status: DC | PRN
  Administered 2016-05-14: 650 mg via ORAL
  Filled 2016-05-11: qty 2

## 2016-05-11 MED ORDER — DEXTROSE 5 % IV SOLN
500.0000 mg | Freq: Once | INTRAVENOUS | Status: AC
  Administered 2016-05-11: 500 mg via INTRAVENOUS
  Filled 2016-05-11: qty 500

## 2016-05-11 MED ORDER — SODIUM CHLORIDE 0.9 % IV SOLN
Freq: Once | INTRAVENOUS | Status: DC
  Administered 2016-05-11: 15:00:00 via INTRAVENOUS

## 2016-05-11 MED ORDER — ACETAMINOPHEN 650 MG RE SUPP: 650.0000 mg | Freq: Four times a day (QID) | RECTAL | Status: DC | PRN

## 2016-05-11 NOTE — ED Notes (Signed)
Patient transported to CT 

## 2016-05-11 NOTE — ED Notes (Signed)
Dr. Waymon AmatoHongalgi at bedside, Vesta MixerKoula Pate, Charge RN made this Rn aware that the health department contacted the Emergency Department regarding patient and stated that patient has been treated for TB and has been followed and cleared, Health Dept advised that patient is no longer contagious. DR. Waymon AmatoHongalgi made aware.

## 2016-05-11 NOTE — ED Notes (Signed)
Care handoff to [place], RN 

## 2016-05-11 NOTE — ED Provider Notes (Signed)
[facility] DEPT Provider Note   CSN: [id number] Arrival date & time: 05/11/16  1055     History   Chief Complaint Chief Complaint  Patient presents with  . Weakness  . Fatigue    HPI Olivia Williamson is a 70 y.o. female with history of diabetes, tuberculosis who presents with a one-day history of generalized weakness and fatigue. Patient's son was called by patient's CNA reported patient being very lethargic and less responsive than usual today. CNA reported a low sodium level according to son. Patient reports that she has pain in her arms and legs, secondary to neuropathy. Patient reports she has good days and bad days. Patient denies any chest pain, shortness of breath, cough, abdominal pain, nausea, vomiting, urinary symptoms. Family does not know patient is currently treated for active TB.  HPI  Past Medical History:  Diagnosis Date  . Arthritis   . Diabetes mellitus without complication ([place])    stopped DM meds 2004  . GERD (gastroesophageal reflux disease)   . Hypertension    meds stopped 1 week ago , need to get renewed  . Tuberculosis    ACTIVE    Patient Active Problem List   Diagnosis Date Noted  . Orthostatic hypotension 05/11/2016  . Thyroid nodule 04/13/2016  . Postmenopausal status 04/13/2016  . Chest pain   . Abnormal CT of the chest   . Tuberculosis 01/09/2016  . Protein-calorie malnutrition, severe 12/16/2015  . Generalized weakness 12/15/2015  . Anemia 12/15/2015  . Hyperglycemia 12/15/2015  . Nausea without vomiting 12/15/2015  . Hypertension   . GERD (gastroesophageal reflux disease)   . Diabetes mellitus with neurological manifestations [facility])     Past Surgical History:  Procedure Laterality Date  . ABDOMINAL HYSTERECTOMY    . CHOLECYSTECTOMY N/A 05/02/2014   Procedure: LAPAROSCOPIC CHOLECYSTECTOMY ;  Surgeon: Coralie Keens, MD;  Location: [facility];  Service: General;  Laterality: N/A;  laparoscopic cholecystectomy  . COLONOSCOPY    . NO PAST  SURGERIES    . VIDEO BRONCHOSCOPY Bilateral 01/10/2016   Procedure: VIDEO BRONCHOSCOPY WITHOUT FLUORO;  Surgeon: Collene Gobble, MD;  Location: [facility];  Service: Cardiopulmonary;  Laterality: Bilateral;    OB History    No data available       Home Medications    Prior to Admission medications   Medication Sig Start Date End Date Taking? Authorizing Provider  blood glucose meter kit and supplies KIT Dispense based on patient and insurance preference. Use up to four times daily as directed. (FOR ICD-9 250.00, 250.01). 12/16/15   Albertine Patricia, MD  feeding supplement, GLUCERNA SHAKE, (GLUCERNA SHAKE) LIQD Take 237 mLs by mouth 2 (two) times daily between meals. 12/16/15   Silver Huguenin Elgergawy, MD  Ferrous Gluconate 324 (37.5 Fe) MG TABS Take 1 tablet (324 mg total) by mouth daily. 12/30/15   Maren Reamer, MD  gabapentin (NEURONTIN) 300 MG capsule Take 2 capsules (600 mg total) by mouth 3 (three) times daily. 12/30/15   Maren Reamer, MD  glucose blood test strip Use as instructed 12/30/15   Maren Reamer, MD  HYDROcodone-acetaminophen (NORCO/VICODIN) 5-325 MG tablet Take 0.5 tablets by mouth every 4 (four) hours as needed for moderate pain. 04/21/16   [name], MD  Insulin Glargine (LANTUS SOLOSTAR) 100 UNIT/ML Solostar Pen Inject 25 Units into the skin daily at 10 pm. 03/27/16   Argentina Donovan, PA-C  Insulin Pen Needle (ULTICARE MICRO PEN NEEDLES) 32G X 4 MM MISC 1  applicator by Does not apply route at bedtime. 12/30/15   Maren Reamer, MD  isoniazid (NYDRAZID) 300 MG tablet Take 300 mg by mouth daily.     Historical Provider, MD  Lancet Devices (ACCU-CHEK [facility]) lancets Use as instructed 12/30/15   Maren Reamer, MD  pyrazinamide 500 MG tablet Take 1,000 mg by mouth daily.    Historical Provider, MD  pyridOXINE (VITAMIN B-6) 50 MG tablet Take 50 mg by mouth daily.    Historical Provider, MD  rifampin (RIFADIN) 150 MG capsule Take 150 mg by mouth daily.     Historical Provider, MD  saccharomyces boulardii (FLORASTOR) 250 MG capsule Take 1 capsule (250 mg total) by mouth 2 (two) times daily. Any probiotic that is covered 01/16/16   Thurnell Lose, MD  traMADol (ULTRAM) 50 MG tablet Take 1 tablet (50 mg total) by mouth every 6 (six) hours as needed. Patient taking differently: Take 50 mg by mouth every 6 (six) hours as needed for moderate pain.  02/25/16   Milton Ferguson, MD  Vitamin D, Ergocalciferol, (DRISDOL) 50000 units CAPS capsule Take 1 capsule (50,000 Units total) by mouth every 7 (seven) days. 12/31/15   Maren Reamer, MD    Family History Family History  Problem Relation Age of Onset  . Heart disease Maternal Uncle 70    Social History Social History  Substance Use Topics  . Smoking status: Never Smoker  . Smokeless tobacco: Never Used  . Alcohol use No     Allergies   Beef-derived products; Eggs or egg-derived products; Fish allergy; and Fruit & vegetable daily [nutritional supplements]   Review of Systems Review of Systems  Constitutional: Positive for fatigue. Negative for chills and fever.  HENT: Negative for facial swelling and sore throat.   Respiratory: Negative for shortness of breath.   Cardiovascular: Negative for chest pain.  Gastrointestinal: Negative for abdominal pain, nausea and vomiting.  Genitourinary: Negative for dysuria.  Musculoskeletal: Positive for arthralgias and myalgias. Negative for back pain.  Skin: Negative for rash and wound.  Neurological: Negative for headaches.  Psychiatric/Behavioral: The patient is not nervous/anxious.      Physical Exam Updated Vital Signs BP 105/68   Pulse 75   Temp 98 F (36.7 C) (Rectal)   Resp 13   LMP 05/11/2016   SpO2 100%   Physical Exam  Constitutional: She appears well-developed and well-nourished. No distress.  HENT:  Head: Normocephalic and atraumatic.  Mouth/Throat: Oropharynx is clear and moist. No oropharyngeal exudate.  Eyes: Conjunctivae  are normal. Pupils are equal, round, and reactive to light. Right eye exhibits no discharge. Left eye exhibits no discharge. No scleral icterus.  Neck: Normal range of motion. Neck supple. No thyromegaly present.  Cardiovascular: Normal rate, regular rhythm, normal heart sounds and intact distal pulses.  Exam reveals no gallop and no friction rub.   No murmur heard. Pulmonary/Chest: Effort normal and breath sounds normal. No stridor. No respiratory distress. She has no wheezes. She has no rales.  Abdominal: Soft. Bowel sounds are normal. She exhibits no distension. There is no tenderness. There is no rebound and no guarding.  Musculoskeletal: She exhibits no edema.  Lymphadenopathy:    She has no cervical adenopathy.  Neurological: She is alert. Coordination normal.  CN 3-12 intact; normal sensation throughout; 5/5 strength in all 4 extremities; equal bilateral grip strength  Skin: Skin is warm and dry. No rash noted. She is not diaphoretic. No pallor.  Psychiatric: She has a normal mood  and affect.  Nursing note and vitals reviewed.    ED Treatments / Results  Labs (all labs ordered are listed, but only abnormal results are displayed) Labs Reviewed  CBC WITH DIFFERENTIAL/PLATELET - Abnormal; Notable for the following:       Result Value   WBC 3.1 (*)    Hemoglobin 9.2 (*)    HCT 26.6 (*)    MCV 65.8 (*)    MCH 22.8 (*)    Platelets 102 (*)    All other components within normal limits  COMPREHENSIVE METABOLIC PANEL - Abnormal; Notable for the following:    Sodium 130 (*)    Chloride 96 (*)    Glucose, Bld 198 (*)    Total Protein 5.7 (*)    Albumin 3.1 (*)    All other components within normal limits  URINALYSIS, ROUTINE W REFLEX MICROSCOPIC - Abnormal; Notable for the following:    Color, Urine STRAW (*)    Specific Gravity, Urine 1.003 (*)    Glucose, UA 150 (*)    All other components within normal limits  I-STAT TROPOININ, ED    EKG  EKG  Interpretation  Date/Time:  Monday May 11 2016 11:43:38 EST Ventricular Rate:  68 PR Interval:    QRS Duration: 79 QT Interval:  417 QTC Calculation: 444 R Axis:   4 Text Interpretation:  Sinus rhythm Probable anteroseptal infarct, old No STEMI.  Confirmed by LONG MD, JOSHUA [phone]) on 05/11/2016 11:50:02 AM       Radiology Dg Chest 2 View  Result Date: 05/11/2016 CLINICAL DATA:  Weakness and lethargy. EXAM: CHEST  2 VIEW COMPARISON:  04/21/2016 FINDINGS: The cardiac silhouette appears enlarged though is accentuated by AP technique and shallow lung inflation. The lungs are less well inflated than on the prior study, and there is interstitial accentuation and bronchovascular crowding with slight nodularity in both lung bases, more so on the right. There is no evidence of pulmonary edema, pleural effusion, or pneumothorax. Right upper quadrant abdominal surgical clips are noted. No acute osseous abnormality is seen. IMPRESSION: Hypoinflation with minimal bibasilar opacities which are favored to represent atelectasis though atypical infection is not completely excluded. Electronically Signed   By: Logan Bores M.D.   On: 05/11/2016 14:36    Procedures Procedures (including critical care time)  Medications Ordered in ED Medications  0.9 %  sodium chloride infusion ( Intravenous New Bag/Given 05/11/16 1458)  azithromycin (ZITHROMAX) 500 mg in dextrose 5 % 250 mL IVPB (500 mg Intravenous New Bag/Given 05/11/16 1611)  sodium chloride 0.9 % bolus 500 mL (0 mLs Intravenous Stopped 05/11/16 1440)  cefTRIAXone (ROCEPHIN) 1 g in dextrose 5 % 50 mL IVPB (0 g Intravenous Stopped 05/11/16 1556)     Initial Impression / Assessment and Plan / ED Course  I have reviewed the triage vital signs and the nursing notes.  Pertinent labs & imaging results that were available during my care of the patient were reviewed by me and considered in my medical decision making (see chart for  details).  Clinical Course    Patient with generalized fatigue and lethargy. Suspect possible atypical pneumonia. CBC shows WBC 3.1, hemoglobin 9.2, which is chronic and stable, platelets 102. CMP shows sodium 1:30, chloride 96, glucose 198, protein 5.7, albumin 3.1. UA shows 50 glucose. CXR shows hypoinflation with minimal bibasilar opacities which are favored to represent atelectasis though atypical infection is not completely excluded. Patient significantly orthostatic with orthostatic vitals lying 104/55, sitting 85/57, standing 70/41. Patient  given 500 mL bolus of normal saline in the ED and patient initiated a 125 mL per hour infusion. I spoke with Dr. Algis Liming with [facility] who will admit the patient for further evaluation and treatment. Patient also evaluated by Dr. Laverta Baltimore who guided the patient's management and agrees with plan.  Final Clinical Impressions(s) / ED Diagnoses   Final diagnoses:  Weakness  Orthostatic hypotension    New Prescriptions New Prescriptions   No medications on file     Frederica Kuster, Hershal Coria 05/11/16 [place], MD 05/11/16 2056

## 2016-05-11 NOTE — ED Triage Notes (Signed)
[facility] after being found in bed at 0500,  responsive only to pain, weak and lethargic. Per EMS, pt's son found her, the in-home CNA "checked her sodium" and it was 120. CBG 225, 108/55, 71HR, 99%

## 2016-05-11 NOTE — ED Notes (Signed)
Patient transported to X-ray 

## 2016-05-11 NOTE — ED Notes (Signed)
Pt Son at Bedside

## 2016-05-11 NOTE — ED Notes (Signed)
Language service contacted for montagnard interpretor. This RN spoke with Olivia Williamson who advised an interpretor was 5 mins from the hospital. Dr. Waymon AmatoHongalgi made aware

## 2016-05-11 NOTE — ED Notes (Signed)
montagnard interpretor at bedside

## 2016-05-11 NOTE — ED Notes (Signed)
MD at bedside. 

## 2016-05-11 NOTE — ED Notes (Signed)
Dr Waymon Amatohongalgi advised this RN may discontinue airborne precautions since [facility] dept confirmed that patient has been treated for TB and is no longer contagious. Pt placement also  Made aware that patient does not need negative pressure room.

## 2016-05-11 NOTE — H&P (Addendum)
History and Physical    Olivia Williamson YYF:[id number] DOB: Jan 08, 1946 DOA: 05/11/2016   PCP: Wilfred Lacy, NP    Patient coming from: Home  Chief Complaint: Weakness  HPI: Olivia Williamson is a 70 y.o. female , [place] speaking, lives with her son, ambulates with the help of a cane, PMH of DM 2, GERD, HTN, TB (as per report from [facility] >treated for TB and no longer contagious) brought to [facility] ED for complaints of weakness and fatigue. History is very limited despite availing the resources of human interpreter at bedside. This is because patient is unable to provide much history and her other son who is at the bedside does not live with her and does not seem to know much. The son with whom she lives brought her to the hospital but then has left for work. She gives history of weakness, dizziness, headache, feeling hot, pain and numbness in her extremities. She states that she has a good appetite. She indicates that she has lost an undetermined amount of weight. She denies chest pain, dyspnea, cough, nausea, vomiting, abdominal pain, diarrhea, dysuria or urinary frequency. She is unable to tell the duration of her symptoms or expand on any details. As per EDP note, she had 1 day history of generalized weakness and fatigue. Patient's son was called by patient's CNA reporting that patient was very lethargic and less responsive than usual today and that she had low sodium (not sure how she got this). As per son, patient's mental status currently appears to be her baseline and he indicates that he has seen her in current physical state chronically. She apparently has good days and bad days.   ED Course: Evaluation in ED revealed orthostatic hypotension, lab work significant for pancytopenia, sodium 1:30, glucose 198, urine microscopy negative for UTI features and chest x-ray shows minimal basilar opacities favored to represent atelectasis.  Review of Systems:  All other systems reviewed  and apart from HPI, are negative.  Past Medical History:  Diagnosis Date  . Arthritis   . Diabetes mellitus without complication ([place])    stopped DM meds 2004  . GERD (gastroesophageal reflux disease)   . Hypertension    meds stopped 1 week ago , need to get renewed  . Tuberculosis    ACTIVE    Past Surgical History:  Procedure Laterality Date  . ABDOMINAL HYSTERECTOMY    . CHOLECYSTECTOMY N/A 05/02/2014   Procedure: LAPAROSCOPIC CHOLECYSTECTOMY ;  Surgeon: Coralie Keens, MD;  Location: [facility];  Service: General;  Laterality: N/A;  laparoscopic cholecystectomy  . COLONOSCOPY    . NO PAST SURGERIES    . VIDEO BRONCHOSCOPY Bilateral 01/10/2016   Procedure: VIDEO BRONCHOSCOPY WITHOUT FLUORO;  Surgeon: Collene Gobble, MD;  Location: [place];  Service: Cardiopulmonary;  Laterality: Bilateral;   Social history  reports that she has never smoked. She has never used smokeless tobacco. She reports that she does not drink alcohol or use drugs.  Allergies  Allergen Reactions  . Beef-Derived Products Other (See Comments)    Generalized burning sensation  . Eggs Or Egg-Derived Products Other (See Comments)    Generalized burning sensation  . Fish Allergy Other (See Comments)    Generalized burning sensation  . Fruit & Vegetable Daily [Nutritional Supplements] Other (See Comments)    Orange causes lower extremity burning    Family History  Problem Relation Age of Onset  . Heart disease Maternal Uncle 70     Prior to Admission medications  Medication Sig Start Date End Date Taking? Authorizing Provider  blood glucose meter kit and supplies KIT Dispense based on patient and insurance preference. Use up to four times daily as directed. (FOR ICD-9 250.00, 250.01). 12/16/15   Albertine Patricia, MD  feeding supplement, GLUCERNA SHAKE, (GLUCERNA SHAKE) LIQD Take 237 mLs by mouth 2 (two) times daily between meals. 12/16/15   Silver Huguenin Elgergawy, MD  Ferrous Gluconate 324 (37.5 Fe) MG  TABS Take 1 tablet (324 mg total) by mouth daily. 12/30/15   Maren Reamer, MD  gabapentin (NEURONTIN) 300 MG capsule Take 2 capsules (600 mg total) by mouth 3 (three) times daily. 12/30/15   Maren Reamer, MD  glucose blood test strip Use as instructed 12/30/15   Maren Reamer, MD  HYDROcodone-acetaminophen (NORCO/VICODIN) 5-325 MG tablet Take 0.5 tablets by mouth every 4 (four) hours as needed for moderate pain. 04/21/16   [name], MD  Insulin Glargine (LANTUS SOLOSTAR) 100 UNIT/ML Solostar Pen Inject 25 Units into the skin daily at 10 pm. 03/27/16   Argentina Donovan, PA-C  Insulin Pen Needle (ULTICARE MICRO PEN NEEDLES) 32G X 4 MM MISC 1 applicator by Does not apply route at bedtime. 12/30/15   Maren Reamer, MD  isoniazid (NYDRAZID) 300 MG tablet Take 300 mg by mouth daily.     Historical Provider, MD  Lancet Devices (ACCU-CHEK [facility]) lancets Use as instructed 12/30/15   Maren Reamer, MD  pyrazinamide 500 MG tablet Take 1,000 mg by mouth daily.    Historical Provider, MD  pyridOXINE (VITAMIN B-6) 50 MG tablet Take 50 mg by mouth daily.    Historical Provider, MD  rifampin (RIFADIN) 150 MG capsule Take 150 mg by mouth daily.    Historical Provider, MD  saccharomyces boulardii (FLORASTOR) 250 MG capsule Take 1 capsule (250 mg total) by mouth 2 (two) times daily. Any probiotic that is covered 01/16/16   Thurnell Lose, MD  traMADol (ULTRAM) 50 MG tablet Take 1 tablet (50 mg total) by mouth every 6 (six) hours as needed. Patient taking differently: Take 50 mg by mouth every 6 (six) hours as needed for moderate pain.  02/25/16   Milton Ferguson, MD  Vitamin D, Ergocalciferol, (DRISDOL) 50000 units CAPS capsule Take 1 capsule (50,000 Units total) by mouth every 7 (seven) days. 12/31/15   Maren Reamer, MD    Physical Exam: Vitals:   05/11/16 1515 05/11/16 1530 05/11/16 1545 05/11/16 1600  BP:  128/67 138/75 105/68  Pulse: 67 67 72 75  Resp: 16 13 15 13   Temp:      TempSrc:       SpO2: 99% 100% 99% 100%  Temperature: 71F.    Constitutional: Pleasant middle-aged female, small built, frail and cachectic, chronically ill looking, lying comfortably supine in the gurney without distress. Eyes: PERTLA, lids and conjunctivae normal. Bilateral immature cataracts and arcus senilis. Right eye nasal Pterigym. ENMT: Mucous membranes are dry. Posterior pharynx clear of any exudate or lesions. Multiple missing teeth.  Neck: normal, supple, no masses, no thyromegaly Respiratory: Occasional basal crackles left, greater than right. Rest of lung fields clear. No increased work of breathing. Cardiovascular: S1 & S2 heard, regular rate and rhythm, no murmurs / rubs / gallops. No extremity edema. 2+ pedal pulses. No carotid bruits.  Abdomen: No distension, no tenderness, no masses palpated. No hepatosplenomegaly. Bowel sounds normal.  Musculoskeletal: no clubbing / cyanosis. No joint deformity upper and lower extremities. Good ROM, no contractures.  Normal muscle tone.  Skin: no rashes, lesions, ulcers. No induration Neurologic: CN 2-12 grossly intact. Sensation intact, DTR normal. Strength 5/5 in all 4 limbs.  Psychiatric: Judgment and insight are poor. Alert and oriented x person and partly to place. Flat affect.     Labs on Admission: I have personally reviewed following labs and imaging studies  CBC:  Recent Labs Lab 05/11/16 1135  WBC 3.1*  NEUTROABS 1.9  HGB 9.2*  HCT 26.6*  MCV 65.8*  PLT 989*   Basic Metabolic Panel:  Recent Labs Lab 05/11/16 1135  NA 130*  K 3.5  CL 96*  CO2 29  GLUCOSE 198*  BUN 12  CREATININE 0.68  CALCIUM 9.1   Liver Function Tests:  Recent Labs Lab 05/11/16 1135  AST 26  ALT 25  ALKPHOS 58  BILITOT 0.7  PROT 5.7*  ALBUMIN 3.1*   Urine analysis:    Component Value Date/Time   COLORURINE STRAW (A) 05/11/2016 1335   APPEARANCEUR CLEAR 05/11/2016 1335   LABSPEC 1.003 (L) 05/11/2016 1335   PHURINE 6.0 05/11/2016 1335     GLUCOSEU 150 (A) 05/11/2016 1335   HGBUR NEGATIVE 05/11/2016 1335   BILIRUBINUR NEGATIVE 05/11/2016 1335   KETONESUR NEGATIVE 05/11/2016 1335   PROTEINUR NEGATIVE 05/11/2016 1335   NITRITE NEGATIVE 05/11/2016 1335   LEUKOCYTESUR NEGATIVE 05/11/2016 1335     Radiological Exams on Admission: Dg Chest 2 View  Result Date: 05/11/2016 CLINICAL DATA:  Weakness and lethargy. EXAM: CHEST  2 VIEW COMPARISON:  04/21/2016 FINDINGS: The cardiac silhouette appears enlarged though is accentuated by AP technique and shallow lung inflation. The lungs are less well inflated than on the prior study, and there is interstitial accentuation and bronchovascular crowding with slight nodularity in both lung bases, more so on the right. There is no evidence of pulmonary edema, pleural effusion, or pneumothorax. Right upper quadrant abdominal surgical clips are noted. No acute osseous abnormality is seen. IMPRESSION: Hypoinflation with minimal bibasilar opacities which are favored to represent atelectasis though atypical infection is not completely excluded. Electronically Signed   By: Logan Bores M.D.   On: 05/11/2016 14:36    EKG: Independently reviewed. Sinus rhythm at 68 bpm, normal axis, no acute changes. QTC 444 ms.  Assessment/Plan Active Problems:   Generalized weakness   Hypertension   GERD (gastroesophageal reflux disease)   Diabetes mellitus with neurological manifestations (HCC)   Dehydration with hyponatremia   Pancytopenia (HCC)   Protein-calorie malnutrition, severe   Tuberculosis   Thyroid nodule   Orthostatic hypotension     1. Dehydration with hyponatremia: Suspect this is secondary to poor oral intake. IV banana bag followed by IV Normal saline with potassium supplements. 2. Orthostatic hypotension: Likely related to dehydration. IV fluids and follow in a.m. 3. Severe protein calorie malnutrition: Seems to be a chronic problem. Unclear as to patient's home/social situation. May need  to consult social work to assess that. Dietitian consultation to assess nutritional needs. 4. Type II DM: SSI. Managed by endocrinologist Dr. Freeman Caldron whom she last saw 03/2016. A1c 03/2016:6.7. Apparently on Lantus in September. Review home medications and resume as appropriate. 5. Generalized weakness/dizziness/lethargy/AMS: Currently she is fully alert, answers questions appropriately as best as she can and no obvious focal deficits. Suspect all of this is due to problems listed above. Obtain CT head to rule out any acute events. No clinical features suggestive of ongoing infection. Urine microscopy negative. Chest x-ray unremarkable. No symptoms suggestive of pneumonia. Check procalcitonin. Although she  has received a dose of IV ceftriaxone and azithromycin in the ED, in the absence of clear source of infection, will not continue. Monitor clinically. PT, OT evaluation. 6. Essential hypertension: Soft blood pressures and orthostatic on admission. Hold antihypertensives. IV fluids and reassess. 7. Pancytopenia/microcytosis: Unclear etiology. Her home medication reconciliation has not been completed-? Related to meds. Anemia panel in June was consistent with iron deficiency. Consider iron supplements and outpatient workup for same. Follow CBCs. Transfuse if hemoglobin less than 7 g per DL. 8. Tuberculosis: As per ED report after discussing with [facility], she is being treated for TB and is no longer contagious. Review home medications. As per PCP note 04/13/16, she was still on INH, rifampicin, pyrazinamide, B6. Her treatment apparently had been started in September 2017. 9. Adult failure to thrive: Multifactorial. 10. Thyroid nodule: As per PCP follow-up, normal thyroid panel which is to be repeated next year. Thyroid ultrasound pending. He shouldn't apparently declined surgery in the past.   DVT prophylaxis:   Lovenox Code Status: Full> this was confirmed with the son at  bedside through the interpreter.  Family Communication: discussed in detail with patient's son at bedside via the interpreter.  Disposition Plan: DC home when medically stable.  Consults called: none  Admission status: observation, medical bed.    [facility] MD [facility] Pager [phone]  If 7PM-7AM, please contact night-coverage [email or web address] Password [email or web address]  05/11/2016, 5:01 PM

## 2016-05-11 NOTE — Progress Notes (Signed)
NP asked by attending to f/up medical reconciliation and start appropriate meds tonight. 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 On TB med, ? Taking. Per pharmacy, pt's son is supposed to bring her meds from home, then med rec can be completed. Pt does not speak English and can not relate her meds.  If not tonight, hopefully, meds will be reconciled by tomorrow. Pt is on basic meds for tonight and anti hypertensives are being held for hypotension.  KJKG, NP [facility]

## 2016-05-11 NOTE — ED Notes (Signed)
CT contacted and advised they would attempt to get patient over next prior to patient going to floor

## 2016-05-12 DIAGNOSIS — E162 Hypoglycemia, unspecified: Secondary | ICD-10-CM

## 2016-05-12 DIAGNOSIS — Z91013 Allergy to seafood: Secondary | ICD-10-CM | POA: Diagnosis not present

## 2016-05-12 DIAGNOSIS — E041 Nontoxic single thyroid nodule: Secondary | ICD-10-CM | POA: Diagnosis present

## 2016-05-12 DIAGNOSIS — K219 Gastro-esophageal reflux disease without esophagitis: Secondary | ICD-10-CM | POA: Diagnosis present

## 2016-05-12 DIAGNOSIS — E1140 Type 2 diabetes mellitus with diabetic neuropathy, unspecified: Secondary | ICD-10-CM | POA: Diagnosis present

## 2016-05-12 DIAGNOSIS — I951 Orthostatic hypotension: Secondary | ICD-10-CM | POA: Diagnosis present

## 2016-05-12 DIAGNOSIS — M79651 Pain in right thigh: Secondary | ICD-10-CM | POA: Diagnosis present

## 2016-05-12 DIAGNOSIS — M79652 Pain in left thigh: Secondary | ICD-10-CM | POA: Diagnosis present

## 2016-05-12 DIAGNOSIS — R531 Weakness: Secondary | ICD-10-CM | POA: Diagnosis present

## 2016-05-12 DIAGNOSIS — E43 Unspecified severe protein-calorie malnutrition: Secondary | ICD-10-CM | POA: Diagnosis present

## 2016-05-12 DIAGNOSIS — Z90710 Acquired absence of both cervix and uterus: Secondary | ICD-10-CM | POA: Diagnosis not present

## 2016-05-12 DIAGNOSIS — A159 Respiratory tuberculosis unspecified: Secondary | ICD-10-CM | POA: Diagnosis present

## 2016-05-12 DIAGNOSIS — R627 Adult failure to thrive: Secondary | ICD-10-CM | POA: Diagnosis present

## 2016-05-12 DIAGNOSIS — Z8249 Family history of ischemic heart disease and other diseases of the circulatory system: Secondary | ICD-10-CM | POA: Diagnosis not present

## 2016-05-12 DIAGNOSIS — E860 Dehydration: Secondary | ICD-10-CM | POA: Diagnosis present

## 2016-05-12 DIAGNOSIS — E11649 Type 2 diabetes mellitus with hypoglycemia without coma: Secondary | ICD-10-CM | POA: Diagnosis present

## 2016-05-12 DIAGNOSIS — Z91012 Allergy to eggs: Secondary | ICD-10-CM | POA: Diagnosis not present

## 2016-05-12 DIAGNOSIS — I10 Essential (primary) hypertension: Secondary | ICD-10-CM | POA: Diagnosis present

## 2016-05-12 DIAGNOSIS — Z8611 Personal history of tuberculosis: Secondary | ICD-10-CM | POA: Diagnosis not present

## 2016-05-12 DIAGNOSIS — R109 Unspecified abdominal pain: Secondary | ICD-10-CM | POA: Diagnosis present

## 2016-05-12 DIAGNOSIS — Z91018 Allergy to other foods: Secondary | ICD-10-CM | POA: Diagnosis not present

## 2016-05-12 DIAGNOSIS — E871 Hypo-osmolality and hyponatremia: Secondary | ICD-10-CM | POA: Diagnosis present

## 2016-05-12 DIAGNOSIS — Z9049 Acquired absence of other specified parts of digestive tract: Secondary | ICD-10-CM | POA: Diagnosis not present

## 2016-05-12 DIAGNOSIS — D61818 Other pancytopenia: Secondary | ICD-10-CM | POA: Diagnosis present

## 2016-05-12 DIAGNOSIS — Z794 Long term (current) use of insulin: Secondary | ICD-10-CM | POA: Diagnosis not present

## 2016-05-12 DIAGNOSIS — Z681 Body mass index (BMI) 19 or less, adult: Secondary | ICD-10-CM | POA: Diagnosis not present

## 2016-05-12 LAB — CBC
HCT: 25 % — ABNORMAL LOW (ref 36.0–46.0)
Hemoglobin: 8.6 g/dL — ABNORMAL LOW (ref 12.0–15.0)
MCH: 22.6 pg — AB (ref 26.0–34.0)
MCHC: 34.4 g/dL (ref 30.0–36.0)
MCV: 65.6 fL — ABNORMAL LOW (ref 78.0–100.0)
PLATELETS: 85 10*3/uL — AB (ref 150–400)
RBC: 3.81 MIL/uL — AB (ref 3.87–5.11)
RDW: 14.2 % (ref 11.5–15.5)
WBC: 3.1 10*3/uL — ABNORMAL LOW (ref 4.0–10.5)

## 2016-05-12 LAB — BASIC METABOLIC PANEL
Anion gap: 7 (ref 5–15)
BUN: 8 mg/dL (ref 6–20)
CALCIUM: 8.5 mg/dL — AB (ref 8.9–10.3)
CO2: 25 mmol/L (ref 22–32)
CREATININE: 0.47 mg/dL (ref 0.44–1.00)
Chloride: 102 mmol/L (ref 101–111)
Glucose, Bld: 50 mg/dL — ABNORMAL LOW (ref 65–99)
Potassium: 3.4 mmol/L — ABNORMAL LOW (ref 3.5–5.1)
SODIUM: 134 mmol/L — AB (ref 135–145)

## 2016-05-12 LAB — GLUCOSE, CAPILLARY
GLUCOSE-CAPILLARY: 68 mg/dL (ref 65–99)
GLUCOSE-CAPILLARY: 99 mg/dL (ref 65–99)
GLUCOSE-CAPILLARY: 211 mg/dL — AB (ref 65–99)
GLUCOSE-CAPILLARY: 258 mg/dL — AB (ref 65–99)
Glucose-Capillary: 153 mg/dL — ABNORMAL HIGH (ref 65–99)

## 2016-05-12 LAB — CK: Total CK: 63 U/L (ref 38–234)

## 2016-05-12 MED ORDER — ISONIAZID 300 MG PO TABS
600.0000 mg | ORAL_TABLET | ORAL | Status: DC
  Administered 2016-05-12 – 2016-05-15 (×3): 600 mg via ORAL
  Filled 2016-05-12 (×3): qty 2

## 2016-05-12 MED ORDER — BOOST / RESOURCE BREEZE PO LIQD
1.0000 | Freq: Three times a day (TID) | ORAL | Status: DC
  Administered 2016-05-12 – 2016-05-14 (×6): 1 via ORAL

## 2016-05-12 MED ORDER — VITAMIN B-6 50 MG PO TABS
50.0000 mg | ORAL_TABLET | ORAL | Status: DC
  Administered 2016-05-12 – 2016-05-15 (×3): 50 mg via ORAL
  Filled 2016-05-12 (×3): qty 1

## 2016-05-12 MED ORDER — GABAPENTIN 300 MG PO CAPS
600.0000 mg | ORAL_CAPSULE | Freq: Three times a day (TID) | ORAL | Status: DC
  Administered 2016-05-12 – 2016-05-15 (×9): 600 mg via ORAL
  Filled 2016-05-12 (×9): qty 2

## 2016-05-12 MED ORDER — SACCHAROMYCES BOULARDII 250 MG PO CAPS
250.0000 mg | ORAL_CAPSULE | Freq: Two times a day (BID) | ORAL | Status: DC
  Administered 2016-05-12 – 2016-05-15 (×7): 250 mg via ORAL
  Filled 2016-05-12 (×7): qty 1

## 2016-05-12 MED ORDER — POTASSIUM CHLORIDE CRYS ER 20 MEQ PO TBCR
40.0000 meq | EXTENDED_RELEASE_TABLET | Freq: Once | ORAL | Status: AC
  Administered 2016-05-12: 40 meq via ORAL
  Filled 2016-05-12: qty 2

## 2016-05-12 MED ORDER — FERROUS GLUCONATE 324 (38 FE) MG PO TABS
324.0000 mg | ORAL_TABLET | Freq: Every day | ORAL | Status: DC
  Administered 2016-05-12 – 2016-05-15 (×4): 324 mg via ORAL
  Filled 2016-05-12 (×4): qty 1

## 2016-05-12 MED ORDER — VITAMIN B-6 50 MG PO TABS
50.0000 mg | ORAL_TABLET | Freq: Every day | ORAL | Status: DC
  Filled 2016-05-12: qty 1

## 2016-05-12 MED ORDER — RIFAMPIN 300 MG PO CAPS
600.0000 mg | ORAL_CAPSULE | ORAL | Status: DC
  Administered 2016-05-12 – 2016-05-13 (×2): 600 mg via ORAL
  Filled 2016-05-12 (×2): qty 2

## 2016-05-12 MED ORDER — ADULT MULTIVITAMIN W/MINERALS CH
1.0000 | ORAL_TABLET | Freq: Every day | ORAL | Status: DC
  Administered 2016-05-12 – 2016-05-15 (×4): 1 via ORAL
  Filled 2016-05-12 (×4): qty 1

## 2016-05-12 MED ORDER — ISONIAZID 300 MG PO TABS: 600.0000 mg | ORAL_TABLET | ORAL | Status: DC

## 2016-05-12 NOTE — Progress Notes (Signed)
Hypoglycemic Event  CBG: 68  Treatment: 4oz Orange Juice  Symptoms: asymptomatic  Follow-up CBG: Time: 0823 CBG Result: 99  Curties Conigliaro L Price

## 2016-05-12 NOTE — Evaluation (Signed)
Physical Therapy Evaluation Patient Details Name: Olivia Williamson MRN: [id number] DOB: September 05, 1945 Today's Date: 05/12/2016   History of Present Illness  Olivia Williamson a 70 y.o.female, Montagnard speaking, lives with her son, ambulates with the help of a cane, PMH of DM 2, GERD, HTN, [facility]treated for TB and no longer contagious) [facility] for complaints of weakness, dehydration, and fatigue.   Clinical Impression  Pt admitted by above, RN, Olivia Williamson translated for pt as pt son and patient having difficulty undestanding english. Pt currently requires minA for all mobility at this time and is at an increased falls risk. For pt to be safe at home, pt must use RW and someone must be with her at all times, all transfers and ADLs. Acute PT to follow.    Follow Up Recommendations Home health PT;Supervision/Assistance - 24 hour    Equipment Recommendations  None recommended by PT    Recommendations for Other Services       Precautions / Restrictions Precautions Precautions: Fall Precaution Comments: pt with TB but being managed by [facility] and is no longer contagious Restrictions Weight Bearing Restrictions: No      Mobility  Bed Mobility Overal bed mobility: Needs Assistance Bed Mobility: Supine to Sit     Supine to sit: Mod assist     General bed mobility comments: pt reaching for assist, modA for trunk elevation and to scoot to EOB  Transfers Overall transfer level: Needs assistance Equipment used: Rolling walker (2 wheeled) Transfers: Sit to/from Stand Sit to Stand: Min assist         General transfer comment: v/c's for safe hand placement, 2 attempts, increased time, mina to steady pt during transition of hand from bed to RW  Ambulation/Gait Ambulation/Gait assistance: Min assist Ambulation Distance (Feet): 75 Feet Assistive device: Rolling walker (2 wheeled) Gait Pattern/deviations: Step-through pattern;Decreased stride  length;Narrow base of support Gait velocity: slow Gait velocity interpretation: Below normal speed for age/gender General Gait Details: v/c's to look straight ahead, minA for walker managment, especially around obstacles  Stairs            Wheelchair Mobility    Modified Rankin (Stroke Patients Only)       Balance Overall balance assessment: Needs assistance Sitting-balance support: No upper extremity supported;Feet supported Sitting balance-Leahy Scale: Fair     Standing balance support: Bilateral upper extremity supported Standing balance-Leahy Scale: Poor                               Pertinent Vitals/Pain Pain Assessment: Faces Faces Pain Scale: Hurts little more Pain Location: unsure Pain Descriptors / Indicators: Grimacing Pain Intervention(s): Monitored during session    Home Living Family/patient expects to be discharged to:: Private residence Living Arrangements: Children Available Help at Discharge: Family;Available 24 hours/day (lives with 2 sons, 1 works days, 1 works nights) Type of Home: [employer] Home Access: Stairs to enter Entrance Stairs-Rails: None Secretary/administratorntrance Stairs-Number of Steps: 4 Home Layout: One level Home Equipment: Environmental consultantWalker - 2 wheels;Cane - single point      Prior Function Level of Independence: Needs assistance   Gait / Transfers Assistance Needed: unsure of how acurate PLOF is, pt reports using cane/RW sometimes  ADL's / Homemaking Assistance Needed: son believes sisters helps her        Hand Dominance        Extremity/Trunk Assessment   Upper Extremity Assessment  Upper Extremity Assessment: Generalized weakness    Lower Extremity Assessment Lower Extremity Assessment: Generalized weakness    Cervical / Trunk Assessment Cervical / Trunk Assessment: Normal  Communication   Communication: Prefers language other than English  Cognition Arousal/Alertness: Awake/alert Behavior During Therapy: Flat  affect Overall Cognitive Status: Impaired/Different from baseline Area of Impairment: Problem solving             Problem Solving: Slow processing;Decreased initiation;Requires tactile cues;Requires verbal cues General Comments: due to language barrerier difficult to assess cognition    General Comments General comments (skin integrity, edema, etc.): pt with urinary incontinence    Exercises     Assessment/Plan    PT Assessment Patient needs continued PT services  PT Problem List Decreased strength;Decreased activity tolerance;Decreased balance;Decreased mobility;Decreased knowledge of use of DME          PT Treatment Interventions DME instruction;Gait training;Stair training;Functional mobility training;Therapeutic exercise;Therapeutic activities;Balance training    PT Goals (Current goals can be found in the Care Plan section)  Acute Rehab PT Goals Patient Stated Goal: didn't state PT Goal Formulation: With patient/family Time For Goal Achievement: 05/19/16 Potential to Achieve Goals: Good    Frequency Min 3X/week   Barriers to discharge        Co-evaluation               End of Session Equipment Utilized During Treatment: Gait belt Activity Tolerance: Patient tolerated treatment well Patient left: in chair;with call bell/phone within reach;with chair alarm set;with family/visitor present Nurse Communication: Mobility status    Functional Assessment Tool Used: clinical judgement Functional Limitation: Mobility: Walking and moving around Mobility: Walking and Moving Around Current Status [phone](G8978): At least 20 percent but less than 40 percent impaired, limited or restricted Mobility: Walking and Moving Around Goal Status [phone](G8979): At least 1 percent but less than 20 percent impaired, limited or restricted    Time: 1019-1047 PT Time Calculation (min) (ACUTE ONLY): 28 min   Charges:   PT Evaluation $PT Eval Moderate Complexity: 1 Procedure PT  Treatments $Gait Training: 8-22 mins   PT G Codes:   PT G-Codes **NOT FOR INPATIENT CLASS** Functional Assessment Tool Used: clinical judgement Functional Limitation: Mobility: Walking and moving around Mobility: Walking and Moving Around Current Status ([phone](G8978): At least 20 percent but less than 40 percent impaired, limited or restricted Mobility: Walking and Moving Around Goal Status [phone](G8979): At least 1 percent but less than 20 percent impaired, limited or restricted    Kaeo Jacome M Geneve Kimpel 05/12/2016, 11:02 AM   Lewis ShockAshly Tricha Ruggirello, PT, DPT Pager #: [phone] Office #: [phone]

## 2016-05-12 NOTE — Progress Notes (Signed)
Inpatient Diabetes Program Recommendations  AACE/ADA: New Consensus Statement on Inpatient Glycemic Control (2015)  Target Ranges:  Prepandial:   less than 140 mg/dL      Peak postprandial:   less than 180 mg/dL (1-2 hours)      Critically ill patients:  140 - 180 mg/dL   Lab Results  Component Value Date   GLUCAP 71 05/11/2016   HGBA1C 6.7 03/27/2016   Results for Hulan SaasYA, [facility] (MRN [id number]) as of 05/12/2016 07:49  Ref. Range 05/11/2016 23:12 05/12/2016 07:51  Glucose-Capillary Latest Ref Range: 65 - 99 mg/dL 71 68   Results for Hulan SaasYA, [facility] (MRN [id number]) as of 05/12/2016 07:49  Ref. Range 12/15/2015 00:30 03/27/2016 16:06  Hemoglobin A1C Unknown 12.2 (H) 6.7   Review of Glycemic Control  Diabetes history: DM, BUN and Creatinine WNL Outpatient Diabetes medications: Lantus 25 units QHS, Metformin 1000 mg BID Current orders for Inpatient glycemic control: Novolog 0-9 TIDAC, Carb Mod diet  Inpatient Diabetes Program Recommendations:    Patient came in with random glucose of 50.  Patient last seen by Endocrinologist 03/2016.  Current range of CBG's below target.  Will continue to watch.  Last A1C in November WNL.  Thank you,  Kristine LineaKaren Cam Harnden, RN, BSN Diabetes Coordinator Inpatient Diabetes Program [phone] (Team Pager)

## 2016-05-12 NOTE — Progress Notes (Signed)
[facility] PROGRESS NOTE  Olivia BuddMoihh Shewmake OZH:[id number] DOB: 08-24-1945 DOA: 05/11/2016  PCP: Alysia Pennaharlotte Nche, NP  Brief History/Interval Summary: 70 year old female who doesn't wish with a past medical history of diabetes, GERD, hypertension, tuberculosis undergoing active treatment per health department but no longer contagious, presented with complaints of weakness and fatigue. Patient was found to be dehydrated. Her orthostatic hypotension. She was hospitalized for further management.  Reason for Visit: Orthostatic hypotension. Hypoglycemia.  Consultants: None  Procedures: None  Antibiotics: On antitubercular treatment  Subjective/Interval History: Patient's son at bedside. Patient does feel some better this morning. Complains of pain in her thigh areas as well as left abdomen.  ROS: Denies any nausea or vomiting  Objective:  Vital Signs  Vitals:   05/11/16 1730 05/11/16 1825 05/11/16 2313 05/12/16 0543  BP: 147/72 (!) 144/58 (!) 154/62   Pulse: 78 76 75   Resp: 11 18  20   Temp:  97.9 F (36.6 C) 98 F (36.7 C) 97.6 F (36.4 C)  TempSrc:  Oral Oral Oral  SpO2: 98% 98% 99% 99%  Weight:  38.1 kg (84 lb 1.6 oz)  38.1 kg (84 lb)  Height:  5' (1.524 m)      Intake/Output Summary (Last 24 hours) at 05/12/16 1219 Last data filed at 05/12/16 0919  Gross per 24 hour  Intake          1444.17 ml  Output              300 ml  Net          1144.17 ml   Filed Weights   05/11/16 1825 05/12/16 0543  Weight: 38.1 kg (84 lb 1.6 oz) 38.1 kg (84 lb)    General appearance: alert, cooperative, appears stated age and no distress Resp: clear to auscultation bilaterally Cardio: regular rate and rhythm, S1, S2 normal, no murmur, click, rub or gallop GI: soft, tender in the left lower quadrant. No rebound, rigidity or guarding. Bowel sounds are present. no masses,  no organomegaly Extremities: No obvious deformities noted. No lesions in both her thighs. Peripheral pulses are  palpable. Neurologic: Awake and alert. No obvious focal neurological deficits.  Lab Results:  Data Reviewed: I have personally reviewed following labs and imaging studies  CBC:  Recent Labs Lab 05/11/16 1135 05/12/16 0527  WBC 3.1* 3.1*  NEUTROABS 1.9  --   HGB 9.2* 8.6*  HCT 26.6* 25.0*  MCV 65.8* 65.6*  PLT 102* 85*    Basic Metabolic Panel:  Recent Labs Lab 05/11/16 1135 05/12/16 0527  NA 130* 134*  K 3.5 3.4*  CL 96* 102  CO2 29 25  GLUCOSE 198* 50*  BUN 12 8  CREATININE 0.68 0.47  CALCIUM 9.1 8.5*    GFR: Estimated Creatinine Clearance: 41 mL/min (by C-G formula based on SCr of 0.47 mg/dL).  Liver Function Tests:  Recent Labs Lab 05/11/16 1135  AST 26  ALT 25  ALKPHOS 58  BILITOT 0.7  PROT 5.7*  ALBUMIN 3.1*    Cardiac Enzymes:  Recent Labs Lab 05/12/16 0934  CKTOTAL 63    CBG:  Recent Labs Lab 05/11/16 2312 05/12/16 0751 05/12/16 0823 05/12/16 1155  GLUCAP 71 68 99 211*     Radiology Studies: Dg Chest 2 View  Result Date: 05/11/2016 CLINICAL DATA:  Weakness and lethargy. EXAM: CHEST  2 VIEW COMPARISON:  04/21/2016 FINDINGS: The cardiac silhouette appears enlarged though is accentuated by AP technique and shallow lung inflation. The lungs are less well  inflated than on the prior study, and there is interstitial accentuation and bronchovascular crowding with slight nodularity in both lung bases, more so on the right. There is no evidence of pulmonary edema, pleural effusion, or pneumothorax. Right upper quadrant abdominal surgical clips are noted. No acute osseous abnormality is seen. IMPRESSION: Hypoinflation with minimal bibasilar opacities which are favored to represent atelectasis though atypical infection is not completely excluded. Electronically Signed   By: Sebastian Ache M.D.   On: 05/11/2016 14:36   Ct Head Wo Contrast  Result Date: 05/11/2016 CLINICAL DATA:  Headache and weakness. EXAM: CT HEAD WITHOUT CONTRAST TECHNIQUE:  Contiguous axial images were obtained from the base of the skull through the vertex without intravenous contrast. COMPARISON:  None. FINDINGS: Brain: No mass lesion, intraparenchymal hemorrhage or extra-axial collection. No evidence of acute cortical infarct. There is periventricular hypoattenuation compatible with chronic microvascular disease. Vascular: No hyperdense vessel or unexpected calcification. Skull: Normal visualized skull base, calvarium and extracranial soft tissues. Sinuses/Orbits: No sinus fluid levels or advanced mucosal thickening. No mastoid effusion. Normal orbits. IMPRESSION: Chronic microvascular ischemia without acute intracranial abnormality. Electronically Signed   By: Deatra Robinson M.D.   On: 05/11/2016 18:13     Medications:  Scheduled: . enoxaparin (LOVENOX) injection  30 mg Subcutaneous Q24H  . insulin aspart  0-9 Units Subcutaneous TID WC   Continuous: . 0.9 % NaCl with KCl 40 mEq / L 125 mL/hr (05/12/16 0539)   ZOX:[id number] **OR** acetaminophen  Assessment/Plan:  Active Problems:   Generalized weakness   Hypertension   GERD (gastroesophageal reflux disease)   Diabetes mellitus with neurological manifestations (HCC)   Dehydration with hyponatremia   Pancytopenia (HCC)   Protein-calorie malnutrition, severe   Tuberculosis   Thyroid nodule   Orthostatic hypotension    Dehydration with hyponatremia Suspect this is secondary to poor oral intake. Sodium level has improved. Continue IV fluids for now.   Orthostatic hypotension Likely related to dehydration. Continues to be orthostatic. She did have an echocardiogram in August. Report was reviewed. Has normal systolic function. Grade 1 diastolic dysfunction. No significant valvular abnormalities. Mobilize per physical therapy. Repeat orthostatics tomorrow morning.  Bilateral thigh pain. No abnormal findings on examination. She has good pulses. Check CK level.   Abdominal pain She did undergo CT scan  in October which did not show any acute findings. She does have minimal tenderness in the left lower quadrant. Proceed with abdominal films. She could be constipated.  Severe protein calorie malnutrition: eems to be a chronic problem. Unclear as to patient's home/social situation. May need to consult social work to assess that. Dietitian consultation to assess nutritional needs.  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 Managed by endocrinologist Dr. Georgian Co whom she last saw 03/2016. A1c 03/2016:6.7. Apparently on Lantus in September. Low blood glucose levels this morning. Improved with oral challenge. Currently not on her Lantus. Continue to monitor CBG trend today.  Generalized weakness/dizziness/lethargy/AMS Etiology for these is unclear and could be related to all of the above. CT head did not show any acute findings. No clear indication to continue antibiotics.   Essential hypertension Soft blood pressures and orthostatic on admission. Hold antihypertensives. IV fluids and reassess.  Pancytopenia/microcytosis Unclear etiology. Monitor counts closely.  Tuberculosis She is being treated for TB and is no longer contagious. Pharmacy has contacted health department. Please see their note. We will resume her oral agents. Her treatment apparently had been started in September 2017. She is now on a  2 drug regimen with isoniazid and rifampin being given 3 times a week as part of direct observed treatment.  Thyroid nodule As per PCP follow-up, normal thyroid panel which is to be repeated next year. Thyroid ultrasound pending.   DVT Prophylaxis: Lovenox    Code Status: Full code  Family Communication: Discussed with the patient's son  Disposition Plan: Management as outlined above. Mobilize. Continue IV fluids. Repeat orthostatics tomorrow morning.    LOS: 0 days   [facility]illon[facility]ISHNAN,Ellarie Picking  [facility] Pager [phone] 05/12/2016, 12:19 PM  If 7PM-7AM, please contact  night-coverage at [email or web address], password [email or web address]

## 2016-05-12 NOTE — Care Management Obs Status (Signed)
MEDICARE OBSERVATION STATUS NOTIFICATION   Patient Details  Name: Olivia Williamson MRN: [id number] Date of Birth: Jun 18, 1945   Medicare Observation Status Notification Given:  Yes    Lawerance Sabalebbie Azan Maneri, RN 05/12/2016, 1:21 PM

## 2016-05-12 NOTE — Progress Notes (Signed)
Initial Nutrition Assessment  DOCUMENTATION CODES:   Underweight  INTERVENTION:   -Boost Breeze po BID, each supplement provides 250 kcal and 9 grams of protein -MVI daily  NUTRITION DIAGNOSIS:   Inadequate oral intake related to poor appetite as evidenced by per patient/family report.  GOAL:   Patient will meet greater than or equal to 90% of their needs  MONITOR:   PO intake, Supplement acceptance, Labs, Weight trends, Skin, I & O's  REASON FOR ASSESSMENT:   Malnutrition Screening Tool, Consult Assessment of nutrition requirement/status  ASSESSMENT:   Olivia Williamson is a 70 y.o. female , Montagnard speaking, lives with her son, ambulates with the help of a cane, PMH of DM 2, GERD, HTN, [place] [facility] >treated for TB and no longer contagious) [facility] for complaints of weakness and fatigue. History is very limited despite availing the resources of human interpreter at bedside. This is because patient is unable to provide much history and her other son who is at the bedside does not live with her and does not seem to know much.  Pt admitted with dehydration with hypernatremia.   Spoke with pt son at bedside, who was unable to provide much hx. While pt son's English was limited, he answered this RD's questions appropriately. He reports that pt lives with his daughter (pt's granddaughter). He reports pt consumes 100% of breakfast this morning. He suspects that pt has lost weight and has been eating poorly, but unable to provide further details. Pt does not tolerate milk products well per son and would likely not like Ensure supplements.   Case discussed with RN, who reports pt consumes 2 meals per day (Breakfast and dinner). Pt without difficulty consuming liquids and medications.   Nutrition-Focused physical exam completed. Findings are no fat depletion, no muscle depletion, and no edema. Per wt hx, UBW is around 85#. Pt son confirmed  that pt has always been small framed and petite. Pt son is also small-framed.  Labs reviewed: Na: 134 (on IV supplementation), K: 3.4 (opn IV supplementation), CBGS: 71-99.   Diet Order:  Diet Carb Modified Fluid consistency: Thin; Room service appropriate? Yes  Skin:  Reviewed, no issues  Last BM:  05/10/16  Height:   Ht Readings from Last 1 Encounters:  05/11/16 5' (1.524 m)    Weight:   Wt Readings from Last 1 Encounters:  05/12/16 84 lb (38.1 kg)    Ideal Body Weight:  45.5 kg  BMI:  Body mass index is 16.41 kg/m.  Estimated Nutritional Needs:   Kcal:  1000-1200  Protein:  40-55 grams  Fluid:  > 1 L  EDUCATION NEEDS:   No education needs identified at this time  Tammala Weider A. Mayford KnifeWilliams, RD, LDN, CDE Pager: [phone] After hours Pager: [phone]

## 2016-05-12 NOTE — Progress Notes (Addendum)
PHARMACIST - PHYSICIAN COMMUNICATION   Called and [facility] with [facility]e [facility] Phone number = [phone]  Ms. Morga's current TB regimen under direct observation therapy is Rifampin 450 mg po TIW (three times a week) - MWF INH 600 mg po TIW - MWF Vitamin B 6 50 mg po TIW - MWF  She did not receive her doses on Monday 12/18 because she was too sick to take.   [facility] have to be [facility]ed over to the health department upon discharge to document administrations within the hospital.  Thank you Okey RegalLisa Reia Viernes, PharmD [phone]

## 2016-05-12 NOTE — Care Management Note (Addendum)
Case Management Note  Patient Details  Name: Yehuda BuddMoihh Pindell MRN: [id number] Date of Birth: 28-Oct-1945  Subjective/Objective:                 Patient in obs, from home with two sons. In obs for dehydration, orthostatic hypotension. [facility]. Per Dr Rito EhrlichKrishnan, not contagious. Family has been asked to bring in home meds for verification. PT eval pending. Spoke with two sons at bedside, declined interpreter. Provided choice for HH, chose [facility]. No DME needs. PCP Nche   Action/Plan:  CM will place [facility] at time of DC.  Expected Discharge Date:                  Expected Discharge Plan:  Home/Self Care  In-House Referral:     Discharge planning Services  CM Consult  Post Acute Care Choice:    Choice offered to:     DME Arranged:    DME Agency:     HH Arranged:    HH Agency:     Status of Service:  In process, will continue to follow  If discussed at Long Length of Stay Meetings, dates discussed:    Additional Comments:  Lawerance SabalDebbie Esha Fincher, RN 05/12/2016, 10:20 AM

## 2016-05-13 ENCOUNTER — Ambulatory Visit: Payer: Self-pay | Admitting: Internal Medicine

## 2016-05-13 DIAGNOSIS — I10 Essential (primary) hypertension: Secondary | ICD-10-CM | POA: Diagnosis not present

## 2016-05-13 DIAGNOSIS — E871 Hypo-osmolality and hyponatremia: Secondary | ICD-10-CM | POA: Diagnosis not present

## 2016-05-13 DIAGNOSIS — K219 Gastro-esophageal reflux disease without esophagitis: Secondary | ICD-10-CM

## 2016-05-13 DIAGNOSIS — E860 Dehydration: Secondary | ICD-10-CM | POA: Diagnosis not present

## 2016-05-13 DIAGNOSIS — E041 Nontoxic single thyroid nodule: Secondary | ICD-10-CM

## 2016-05-13 DIAGNOSIS — E1142 Type 2 diabetes mellitus with diabetic polyneuropathy: Secondary | ICD-10-CM | POA: Diagnosis not present

## 2016-05-13 DIAGNOSIS — R531 Weakness: Secondary | ICD-10-CM | POA: Diagnosis not present

## 2016-05-13 LAB — BASIC METABOLIC PANEL
ANION GAP: 7 (ref 5–15)
BUN: 14 mg/dL (ref 6–20)
CO2: 29 mmol/L (ref 22–32)
Calcium: 9.1 mg/dL (ref 8.9–10.3)
Chloride: 93 mmol/L — ABNORMAL LOW (ref 101–111)
Creatinine, Ser: 0.63 mg/dL (ref 0.44–1.00)
GLUCOSE: 123 mg/dL — AB (ref 65–99)
POTASSIUM: 4.5 mmol/L (ref 3.5–5.1)
Sodium: 129 mmol/L — ABNORMAL LOW (ref 135–145)

## 2016-05-13 LAB — CBC
HEMATOCRIT: 25.8 % — AB (ref 36.0–46.0)
HEMOGLOBIN: 8.8 g/dL — AB (ref 12.0–15.0)
MCH: 22.4 pg — ABNORMAL LOW (ref 26.0–34.0)
MCHC: 34.1 g/dL (ref 30.0–36.0)
MCV: 65.8 fL — ABNORMAL LOW (ref 78.0–100.0)
Platelets: 85 10*3/uL — ABNORMAL LOW (ref 150–400)
RBC: 3.92 MIL/uL (ref 3.87–5.11)
RDW: 14.3 % (ref 11.5–15.5)
WBC: 5.2 10*3/uL (ref 4.0–10.5)

## 2016-05-13 LAB — GLUCOSE, CAPILLARY
GLUCOSE-CAPILLARY: 322 mg/dL — AB (ref 65–99)
GLUCOSE-CAPILLARY: 208 mg/dL — AB (ref 65–99)
GLUCOSE-CAPILLARY: 318 mg/dL — AB (ref 65–99)
Glucose-Capillary: 190 mg/dL — ABNORMAL HIGH (ref 65–99)

## 2016-05-13 LAB — PROCALCITONIN

## 2016-05-13 MED ORDER — RIFAMPIN ORAL SUSPENSION 25 MG/ML
450.0000 mg | ORAL | Status: DC
  Administered 2016-05-15: 450 mg via ORAL
  Filled 2016-05-13: qty 10

## 2016-05-13 NOTE — Progress Notes (Signed)
Inpatient Diabetes Program Recommendations  AACE/ADA: New Consensus Statement on Inpatient Glycemic Control (2015)  Target Ranges:  Prepandial:   less than 140 mg/dL      Peak postprandial:   less than 180 mg/dL (1-2 hours)      Critically ill patients:  140 - 180 mg/dL   Results for Olivia SaasYA, [facility] (MRN [id number]) as of 05/13/2016 11:51  Ref. Range 05/12/2016 08:23 05/12/2016 11:55 05/12/2016 17:05 05/12/2016 21:44 05/13/2016 08:04  Glucose-Capillary Latest Ref Range: 65 - 99 mg/dL 99 409211 (H) 811153 (H) 914258 (H) 190 (H)   Review of Glycemic Control  Diabetes history: DM 2 Outpatient Diabetes medications: Metformin 1000 mg BID, Lantus 25 units QHS Current orders for Inpatient glycemic control: Novolog Sensitive Correction TID  Inpatient Diabetes Program Recommendations:   Glucose mid 200's last night consider Novolog HS scale. Could also consider Novolog 2-3 units TID meal coverage. Glucose increased from 99 to 211 at lunch without insulin coverage.  Thanks,  Christena DeemShannon Jahron Hunsinger RN, MSN, [facility] Inpatient Diabetes Coordinator Team Pager [phone] (8a-5p)

## 2016-05-13 NOTE — Progress Notes (Signed)
[facility] PROGRESS NOTE  Yehuda BuddMoihh Haidar ZOX:[id number] DOB: 05-31-1945 DOA: 05/11/2016  PCP: Alysia Pennaharlotte Nche, NP  Brief History/Interval Summary: 70 year old female who doesn't wish with a past medical history of diabetes, GERD, hypertension, tuberculosis undergoing active treatment per health department but no longer contagious, presented with complaints of weakness and fatigue. Patient was found to be dehydrated. Her orthostatic hypotension. She was hospitalized for further management.  Reason for Visit: Orthostatic hypotension. Hypoglycemia.  Consultants: None  Procedures: None  Antibiotics: On antitubercular treatment  Subjective/Interval History: Patient's son at bedside. Feels much better, afebrile her breakfast this morning denies any pain.  ROS: Denies any nausea or vomiting  Objective:  Vital Signs  Vitals:   05/13/16 0651 05/13/16 0719 05/13/16 0721 05/13/16 0722  BP: (!) 125/49 (!) 119/57 (!) 117/53 (!) 93/46  Pulse:  85 82 85  Resp: 20     Temp:      TempSrc:      SpO2:      Weight:      Height:        Intake/Output Summary (Last 24 hours) at 05/13/16 1207 Last data filed at 05/12/16 2001  Gross per 24 hour  Intake          1714.58 ml  Output                0 ml  Net          1714.58 ml   Filed Weights   05/11/16 1825 05/12/16 0543 05/13/16 0503  Weight: 38.1 kg (84 lb 1.6 oz) 38.1 kg (84 lb) 37.6 kg (83 lb)    General appearance: alert, cooperative, appears stated age and no distress Resp: clear to auscultation bilaterally Cardio: regular rate and rhythm, S1, S2 normal, no murmur, click, rub or gallop GI: soft, tender in the left lower quadrant. No rebound, rigidity or guarding. Bowel sounds are present. no masses,  no organomegaly Extremities: No obvious deformities noted. No lesions in both her thighs. Peripheral pulses are palpable. Neurologic: Awake and alert. No obvious focal neurological deficits.  Lab Results:  Data Reviewed: I have  personally reviewed following labs and imaging studies  CBC:  Recent Labs Lab 05/11/16 1135 05/12/16 0527 05/13/16 0602  WBC 3.1* 3.1* 5.2  NEUTROABS 1.9  --   --   HGB 9.2* 8.6* 8.8*  HCT 26.6* 25.0* 25.8*  MCV 65.8* 65.6* 65.8*  PLT 102* 85* 85*    Basic Metabolic Panel:  Recent Labs Lab 05/11/16 1135 05/12/16 0527 05/13/16 0602  NA 130* 134* 129*  K 3.5 3.4* 4.5  CL 96* 102 93*  CO2 29 25 29   GLUCOSE 198* 50* 123*  BUN 12 8 14   CREATININE 0.68 0.47 0.63  CALCIUM 9.1 8.5* 9.1    GFR: Estimated Creatinine Clearance: 40.5 mL/min (by C-G formula based on SCr of 0.63 mg/dL).  Liver Function Tests:  Recent Labs Lab 05/11/16 1135  AST 26  ALT 25  ALKPHOS 58  BILITOT 0.7  PROT 5.7*  ALBUMIN 3.1*    Cardiac Enzymes:  Recent Labs Lab 05/12/16 0934  CKTOTAL 63    CBG:  Recent Labs Lab 05/12/16 1155 05/12/16 1705 05/12/16 2144 05/13/16 0804 05/13/16 1159  GLUCAP 211* 153* 258* 190* 322*     Radiology Studies: Dg Chest 2 View  Result Date: 05/11/2016 CLINICAL DATA:  Weakness and lethargy. EXAM: CHEST  2 VIEW COMPARISON:  04/21/2016 FINDINGS: The cardiac silhouette appears enlarged though is accentuated by AP technique and shallow lung  inflation. The lungs are less well inflated than on the prior study, and there is interstitial accentuation and bronchovascular crowding with slight nodularity in both lung bases, more so on the right. There is no evidence of pulmonary edema, pleural effusion, or pneumothorax. Right upper quadrant abdominal surgical clips are noted. No acute osseous abnormality is seen. IMPRESSION: Hypoinflation with minimal bibasilar opacities which are favored to represent atelectasis though atypical infection is not completely excluded. Electronically Signed   By: Sebastian AcheAllen  Grady M.D.   On: 05/11/2016 14:36   Ct Head Wo Contrast  Result Date: 05/11/2016 CLINICAL DATA:  Headache and weakness. EXAM: CT HEAD WITHOUT CONTRAST TECHNIQUE:  Contiguous axial images were obtained from the base of the skull through the vertex without intravenous contrast. COMPARISON:  None. FINDINGS: Brain: No mass lesion, intraparenchymal hemorrhage or extra-axial collection. No evidence of acute cortical infarct. There is periventricular hypoattenuation compatible with chronic microvascular disease. Vascular: No hyperdense vessel or unexpected calcification. Skull: Normal visualized skull base, calvarium and extracranial soft tissues. Sinuses/Orbits: No sinus fluid levels or advanced mucosal thickening. No mastoid effusion. Normal orbits. IMPRESSION: Chronic microvascular ischemia without acute intracranial abnormality. Electronically Signed   By: Deatra RobinsonKevin  Herman M.D.   On: 05/11/2016 18:13     Medications:  Scheduled: . enoxaparin (LOVENOX) injection  30 mg Subcutaneous Q24H  . feeding supplement  1 Container Oral TID BM  . ferrous gluconate  324 mg Oral Daily  . gabapentin  600 mg Oral TID  . insulin aspart  0-9 Units Subcutaneous TID WC  . isoniazid  600 mg Oral Q M,W,F  . multivitamin with minerals  1 tablet Oral Daily  . pyridOXINE  50 mg Oral Q M,W,F  . rifampin  600 mg Oral Q M,W,F  . saccharomyces boulardii  250 mg Oral BID   Continuous:  ZOX:[id number]:acetaminophen **OR** acetaminophen  Assessment/Plan:  Active Problems:   Generalized weakness   Hypertension   GERD (gastroesophageal reflux disease)   Diabetes mellitus with neurological manifestations (HCC)   Dehydration with hyponatremia   Pancytopenia (HCC)   Protein-calorie malnutrition, severe   Tuberculosis   Thyroid nodule   Orthostatic hypotension    Dehydration with hyponatremia Suspect this is secondary to poor oral intake. Sodium level has improved. Continue IV fluids for now.  Continue IV fluids for now, feels much better for today. Still orthostatic.  Orthostatic hypotension Likely related to dehydration. Continues to be orthostatic. She did have an echocardiogram in  August. Report was reviewed. Has normal systolic function. Grade 1 diastolic dysfunction. No significant valvular abnormalities. Mobilize per physical therapy. Orthostatic in a.m., still orthostatic, rifampin is known to cause dizziness and drowsiness.  Bilateral thigh pain. No abnormal findings on examination. She has good pulses. CK level is normal, this is resolved.  Abdominal pain She did undergo CT scan in October which did not show any acute findings. She does have minimal tenderness in the left lower quadrant. Proceed with abdominal films. She could be constipated.  Severe protein calorie malnutrition: eems to be a chronic problem. Unclear as to patient's home/social situation. May need to consult social work to assess that. Dietitian consultation to assess nutritional needs.  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 Managed by endocrinologist Dr. Georgian CoAngela McClung whom she last saw 03/2016. A1c 03/2016:6.7. Apparently on Lantus in September. Low blood glucose levels this morning. Improved with oral challenge. Currently not on her Lantus. Continue to monitor CBG trend today.  Generalized weakness/dizziness/lethargy/AMS Etiology for these is unclear and  could be related to all of the above. CT head did not show any acute findings. No clear indication to continue antibiotics.   Essential hypertension Soft blood pressures and orthostatic on admission. Hold antihypertensives. IV fluids and reassess.  Pancytopenia/microcytosis Unclear etiology. Monitor counts closely.  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 Pharmacy has contacted health department. Please see their note. We will resume her oral agents. Her treatment apparently had been started in September 2017. She is now on a 2 drug regimen with isoniazid and rifampin being given 3 times a week as part of direct observed treatment.  Thyroid nodule As per PCP follow-up, normal  thyroid panel which is to be repeated next year. Thyroid ultrasound pending.    DVT Prophylaxis: Lovenox    Code Status: Full code  Family Communication: Discussed with the patient's son  Disposition Plan: Management as outlined above. Mobilize. Continue IV fluids.     LOS: 1 day   [facility] A  [facility] Pager [phone] 05/13/2016, 12:07 PM  If 7PM-7AM, please contact night-coverage at [email or web address], password [facility]

## 2016-05-13 NOTE — Evaluation (Signed)
Occupational Therapy Evaluation Patient Details Name: Olivia Williamson MRN: [id number] DOB: 1946/02/08 Today's Date: 05/13/2016    History of Present Illness Olivia Williamson a 70 y.o.female, Montagnard speaking, lives with her son, ambulates with the help of a cane, PMH of DM 2, GERD, HTN, [place] [facility] [place] [facility] [place]treated for TB and no longer contagious) [facility] for complaints of weakness, dehydration, and fatigue.    Clinical Impression   Per son, pt was independent with BADL PTA. Currently supervision-min guard for ADL and functional mobility. Pt presenting with impaired balance in standing, generalized weakness, and deconditioning impacting her independence and safety with ADL and functional mobility. Per pts son; pt much better today than yesterday and is getting close to her functional baseline. Pt planning to d/c home with 24/7 supervision from family. Pt would benefit from continued skilled OT to address established goals.    Follow Up Recommendations  No OT follow up;Supervision/Assistance - 24 hour    Equipment Recommendations  Tub/shower seat (2 wheeled RW)    Recommendations for Other Services       Precautions / Restrictions Precautions Precautions: Fall Restrictions Weight Bearing Restrictions: No      Mobility Bed Mobility Overal bed mobility: Needs Assistance Bed Mobility: Supine to Sit;Sit to Supine     Supine to sit: Min assist Sit to supine: Supervision   General bed mobility comments: Son assisting with pt elevating trunk from supine to sit. Increased time required. HOB flat without use of bed rail.  Transfers Overall transfer level: Needs assistance Equipment used: Rolling walker (2 wheeled) Transfers: Sit to/from Stand Sit to Stand: Min guard         General transfer comment: Min guard for safety; no physical assist. Sit to stand from EOB x1, toilet x1.    Balance Overall balance assessment: Needs  assistance Sitting-balance support: No upper extremity supported;Feet unsupported Sitting balance-Leahy Scale: Good     Standing balance support: No upper extremity supported;During functional activity Standing balance-Leahy Scale: Fair Standing balance comment: Pt able to stand at sink and wash hands without UE support                            ADL Overall ADL's : Needs assistance/impaired     Grooming: Wash/dry hands;Supervision/safety;Standing   Upper Body Bathing: Supervision/ safety;Sitting   Lower Body Bathing: Min guard;Sit to/from stand   Upper Body Dressing : Supervision/safety;Sitting   Lower Body Dressing: Min guard;Sit to/from stand   Toilet Transfer: Min guard;Ambulation;Regular Toilet;RW   Toileting- Clothing Manipulation and Hygiene: Min guard;Sitting/lateral lean Toileting - Clothing Manipulation Details (indicate cue type and reason): for peri care only     Functional mobility during ADLs: Engineer, technical salesMin guard;Rolling walker       Vision     Perception     Praxis      Pertinent Vitals/Pain Pain Assessment: No/denies pain     Hand Dominance     Extremity/Trunk Assessment Upper Extremity Assessment Upper Extremity Assessment: Generalized weakness   Lower Extremity Assessment Lower Extremity Assessment: Defer to PT evaluation   Cervical / Trunk Assessment Cervical / Trunk Assessment: Normal   Communication Communication Communication: Prefers language other than English (son interpreting)   Cognition Arousal/Alertness: Awake/alert Behavior During Therapy: Flat affect Overall Cognitive Status: Difficult to assess                     General Comments  Exercises       Shoulder Instructions      Home Living Family/patient expects to be discharged to:: Private residence Living Arrangements: Children Available Help at Discharge: Family;Available 24 hours/day Type of Home: House Home Access: Stairs to enter ITT IndustriesEntrance  Stairs-Number of Steps: 4 Entrance Stairs-Rails: None Home Layout: One level     Bathroom Shower/Tub: Chief Strategy OfficerTub/shower unit   Bathroom Toilet: Standard     Home Equipment: Cane - single point          Prior Functioning/Environment Level of Independence: Needs assistance  Gait / Transfers Assistance Needed: Per son, pt uses a cane at home. Does not have a RW ADL's / Homemaking Assistance Needed: Per son, pt independent with BADL            OT Problem List: Decreased strength;Decreased activity tolerance;Impaired balance (sitting and/or standing)   OT Treatment/Interventions: Self-care/ADL training;DME and/or AE instruction;Therapeutic activities;Patient/family education;Balance training    OT Goals(Current goals can be found in the care plan section) Acute Rehab OT Goals Patient Stated Goal: didn't state OT Goal Formulation: With patient/family Time For Goal Achievement: 05/27/16 Potential to Achieve Goals: Good ADL Goals Pt Will Perform Grooming: with modified independence;standing Pt Will Perform Upper Body Bathing: with modified independence;sitting Pt Will Perform Lower Body Bathing: with modified independence;sit to/from stand Pt Will Perform Tub/Shower Transfer: Tub transfer;with supervision;shower seat;rolling walker  OT Frequency: Min 2X/week   Barriers to D/C:            Co-evaluation              End of Session Equipment Utilized During Treatment: Gait belt;Rolling walker  Activity Tolerance: Patient tolerated treatment well Patient left: in bed;with call bell/phone within reach;with bed alarm set;with family/visitor present   Time: [id number] OT Time Calculation (min): 13 min Charges:  OT General Charges $OT Visit: 1 Procedure OT Evaluation $OT Eval Moderate Complexity: 1 Procedure G-Codes:     Gaye AlkenBailey A Josue Kass M.S., OTR/L Pager: [phone]  05/13/2016, 4:32 PM

## 2016-05-14 DIAGNOSIS — E1142 Type 2 diabetes mellitus with diabetic polyneuropathy: Secondary | ICD-10-CM | POA: Diagnosis not present

## 2016-05-14 DIAGNOSIS — E860 Dehydration: Secondary | ICD-10-CM | POA: Diagnosis not present

## 2016-05-14 DIAGNOSIS — E871 Hypo-osmolality and hyponatremia: Secondary | ICD-10-CM | POA: Diagnosis not present

## 2016-05-14 DIAGNOSIS — R531 Weakness: Secondary | ICD-10-CM | POA: Diagnosis not present

## 2016-05-14 DIAGNOSIS — K219 Gastro-esophageal reflux disease without esophagitis: Secondary | ICD-10-CM | POA: Diagnosis not present

## 2016-05-14 LAB — BASIC METABOLIC PANEL
Anion gap: 7 (ref 5–15)
BUN: 17 mg/dL (ref 6–20)
CALCIUM: 9.2 mg/dL (ref 8.9–10.3)
CO2: 31 mmol/L (ref 22–32)
CREATININE: 0.68 mg/dL (ref 0.44–1.00)
Chloride: 88 mmol/L — ABNORMAL LOW (ref 101–111)
GFR calc non Af Amer: >60 mL/min (ref >60)
Glucose, Bld: 173 mg/dL — ABNORMAL HIGH (ref 65–99)
Potassium: 4.2 mmol/L (ref 3.5–5.1)
SODIUM: 126 mmol/L — AB (ref 135–145)

## 2016-05-14 LAB — GLUCOSE, CAPILLARY
GLUCOSE-CAPILLARY: 201 mg/dL — AB (ref 65–99)
Glucose-Capillary: 339 mg/dL — ABNORMAL HIGH (ref 65–99)
Glucose-Capillary: 236 mg/dL — ABNORMAL HIGH (ref 65–99)
Glucose-Capillary: 234 mg/dL — ABNORMAL HIGH (ref 65–99)

## 2016-05-14 LAB — CORTISOL: CORTISOL PLASMA: 8.9 ug/dL

## 2016-05-14 MED ORDER — BOOST / RESOURCE BREEZE PO LIQD: 1.0000 | Freq: Every day | ORAL | Status: DC

## 2016-05-14 MED ORDER — SODIUM CHLORIDE 0.9 % IV SOLN
INTRAVENOUS | Status: DC
  Administered 2016-05-14: 1000 mL via INTRAVENOUS

## 2016-05-14 NOTE — Progress Notes (Signed)
Nutrition Follow-up  DOCUMENTATION CODES:   Underweight  INTERVENTION:   -Decrease Boost Breeze po to daily, each supplement provides 250 kcal and 9 grams of protein -Continue MVI daily  NUTRITION DIAGNOSIS:   Inadequate oral intake related to poor appetite as evidenced by per patient/family report.  Progressing  GOAL:   Patient will meet greater than or equal to 90% of their needs  Progressing  MONITOR:   PO intake, Supplement acceptance, Labs, Weight trends, Skin, I & O's  REASON FOR ASSESSMENT:   Malnutrition Screening Tool, Consult Assessment of nutrition requirement/status  ASSESSMENT:   Olivia Williamson is a 70 y.o. female , Montagnard speaking, lives with her son, ambulates with the help of a cane, PMH of DM 2, GERD, HTN, TB ([facility]treated for [facility] and no longer contagious) [facility] for complaints of weakness and fatigue. History is very limited despite availing the resources of human interpreter at bedside. This is because patient is unable to provide much history and her other son who is at the bedside does not live with her and does not seem to know much.  Case discussed with RN, who reports pt continues to have a good appetite. Pt is taking Boost Breeze and [facility]VI well [facility].   Medications reviewed floarstor and vitamin B-6 added on 05/12/16.  Labs reviewed: Na:126, CBGS: 201-318. Per DM coordinator note, plan to consider novolog HS and meal coverage. RD will decrease Boost Breeze supplement, due to increased CBGS and increased PO intake.   Diet Order:  Diet Carb Modified Fluid consistency: Thin; Room service appropriate? Yes  Skin:  Reviewed, no issues  Last BM:  05/12/16  Height:   Ht Readings from Last 1 Encounters:  05/11/16 5' (1.524 m)    Weight:   Wt Readings from Last 1 Encounters:  05/14/16 84 lb 14.4 oz (38.5 kg)    Ideal Body Weight:  45.5 kg  BMI:  Body mass index is 16.58  kg/m.  Estimated Nutritional Needs:   Kcal:  1000-1200  Protein:  40-55 grams  Fluid:  > 1 L  EDUCATION NEEDS:   No education needs identified at this time  Indra Wolters A. Mayford KnifeWilliams, RD, LDN, CDE Pager: [phone] After hours Pager: [phone]

## 2016-05-14 NOTE — Progress Notes (Signed)
Physical Therapy Treatment Patient Details Name: Olivia BuddMoihh Williamson MRN: [id number] DOB: 09-12-1945 Today's Date: 05/14/2016    History of Present Illness Olivia MiniumMoihh Williamson a 70 y.o.female, Montagnard speaking, lives with her son, ambulates with the help of a cane, PMH of DM 2, GERD, HTN, [facility] >treated for TB and no longer contagious) [facility] for complaints of weakness, dehydration, and fatigue.     PT Comments    Pt performed increased gait during session but remains to present with lethargy.  Pt limited by language barrier and presents with poor safety.  Pt will likely thrive in home environment.    Follow Up Recommendations  Home health PT;Supervision/Assistance - 24 hour     Equipment Recommendations  None recommended by PT    Recommendations for Other Services       Precautions / Restrictions Precautions Precautions: Fall Precaution Comments: pt with TB but being managed by [facility] and is no longer contagious Restrictions Weight Bearing Restrictions: No    Mobility  Bed Mobility Overal bed mobility: Needs Assistance Bed Mobility: Supine to Sit;Sit to Supine     Supine to sit: Mod assist Sit to supine: Supervision   General bed mobility comments: Son assisting with pt elevating trunk from supine to sit. Increased time required. HOB flat without use of bed rail.  Transfers Overall transfer level: Needs assistance Equipment used: Rolling walker (2 wheeled) Transfers: Sit to/from Stand Sit to Stand: Min guard         General transfer comment: Min guard for safety; no physical assist. Sit to stand from EOB x1, reports mild dizziness in standing.    Ambulation/Gait Ambulation/Gait assistance: Min assist Ambulation Distance (Feet): 100 Feet Assistive device: Rolling walker (2 wheeled) Gait Pattern/deviations: Step-through pattern;Decreased stride length;Narrow base of support;Drifts right/left Gait velocity:  slow Gait velocity interpretation: Below normal speed for age/gender General Gait Details: VCs to focus on task and keep RW straight.  min A for RW management at time.  Pt with poor safety and leaving RW to turn and sit.     Stairs            Wheelchair Mobility    Modified Rankin (Stroke Patients Only)       Balance     Sitting balance-Leahy Scale: Fair       Standing balance-Leahy Scale: Fair                      Cognition Arousal/Alertness: Awake/alert Behavior During Therapy: Flat affect Overall Cognitive Status: Difficult to assess Area of Impairment: Problem solving               General Comments: due to language barrerier difficult to assess cognition    Exercises      General Comments        Pertinent Vitals/Pain Pain Assessment: Faces Faces Pain Scale: Hurts little more Pain Location: B feet and L arm Pain Descriptors / Indicators: Burning;Tingling;Numbness Pain Intervention(s): Monitored during session;Repositioned    Home Living                      Prior Function            PT Goals (current goals can now be found in the care plan section) Acute Rehab PT Goals Patient Stated Goal: didn't state Potential to Achieve Goals: Good Progress towards PT goals: Progressing toward goals    Frequency    Min  3X/week      PT Plan Current plan remains appropriate    Co-evaluation             End of Session Equipment Utilized During Treatment: Gait belt Activity Tolerance: Patient tolerated treatment well Patient left: in chair;with call bell/phone within reach;with chair alarm set;with family/visitor present     Time: [id number] PT Time Calculation (min) (ACUTE ONLY): 13 min  Charges:  $Gait Training: 8-22 mins                    G Codes:      Olivia Williamson 05/14/2016, 3:38 PM  Olivia RuaAimee Tonie Williamson, PTA pager [phone]

## 2016-05-14 NOTE — Progress Notes (Signed)
[facility] PROGRESS NOTE  Olivia Williamson ZOX:[id number] DOB: 1945-08-27 DOA: 05/11/2016  PCP: Olivia Pennaharlotte Nche, NP  Brief History/Interval Summary: 70 year old female who doesn't wish with a past medical history of diabetes, GERD, hypertension, tuberculosis undergoing active treatment per health department but no longer contagious, presented with complaints of weakness and fatigue. Patient was found to be dehydrated. Her orthostatic hypotension. She was hospitalized for further management.  Reason for Visit: Orthostatic hypotension. Hypoglycemia.  Consultants: None  Procedures: None  Antibiotics: On antitubercular treatment  Subjective/Interval History: The patient's son and daughter at bedside, spoke her utilizing interpreter. Denies any complaints, according to a.m. bottles is still orthostatic.  ROS: Denies any nausea or vomiting  Objective:  Vital Signs  Vitals:   05/14/16 0500 05/14/16 0624 05/14/16 0626 05/14/16 0627  BP:  (!) 142/72 (!) 99/51 (!) 88/51  Pulse:  71 79 91  Resp:  16    Temp:  99.4 F (37.4 C)    TempSrc:  Oral    SpO2:  97% 97% 96%  Weight: 38.5 kg (84 lb 14.4 oz)     Height:        Intake/Output Summary (Last 24 hours) at 05/14/16 1334 Last data filed at 05/14/16 1037  Gross per 24 hour  Intake              437 ml  Output                0 ml  Net              437 ml   Filed Weights   05/12/16 0543 05/13/16 0503 05/14/16 0500  Weight: 38.1 kg (84 lb) 37.6 kg (83 lb) 38.5 kg (84 lb 14.4 oz)    General appearance: alert, cooperative, appears stated age and no distress Resp: clear to auscultation bilaterally Cardio: regular rate and rhythm, S1, S2 normal, no murmur, click, rub or gallop GI: soft, tender in the left lower quadrant. No rebound, rigidity or guarding. Bowel sounds are present. no masses,  no organomegaly Extremities: No obvious deformities noted. No lesions in both her thighs. Peripheral pulses are palpable. Neurologic:  Awake and alert. No obvious focal neurological deficits.  Lab Results:  Data Reviewed: I have personally reviewed following labs and imaging studies  CBC:  Recent Labs Lab 05/11/16 1135 05/12/16 0527 05/13/16 0602  WBC 3.1* 3.1* 5.2  NEUTROABS 1.9  --   --   HGB 9.2* 8.6* 8.8*  HCT 26.6* 25.0* 25.8*  MCV 65.8* 65.6* 65.8*  PLT 102* 85* 85*    Basic Metabolic Panel:  Recent Labs Lab 05/11/16 1135 05/12/16 0527 05/13/16 0602 05/14/16 0507  NA 130* 134* 129* 126*  K 3.5 3.4* 4.5 4.2  CL 96* 102 93* 88*  CO2 29 25 29 31   GLUCOSE 198* 50* 123* 173*  BUN 12 8 14 17   CREATININE 0.68 0.47 0.63 0.68  CALCIUM 9.1 8.5* 9.1 9.2    GFR: Estimated Creatinine Clearance: 41.5 mL/min (by C-G formula based on SCr of 0.68 mg/dL).  Liver Function Tests:  Recent Labs Lab 05/11/16 1135  AST 26  ALT 25  ALKPHOS 58  BILITOT 0.7  PROT 5.7*  ALBUMIN 3.1*    Cardiac Enzymes:  Recent Labs Lab 05/12/16 0934  CKTOTAL 63    CBG:  Recent Labs Lab 05/13/16 1159 05/13/16 1654 05/13/16 2233 05/14/16 0818 05/14/16 1217  GLUCAP 322* 208* 318* 201* 339*     Radiology Studies: No results found.   Medications:  Scheduled: . enoxaparin (LOVENOX) injection  30 mg Subcutaneous Q24H  . [START ON 05/15/2016] feeding supplement  1 Container Oral QHS  . ferrous gluconate  324 mg Oral Daily  . gabapentin  600 mg Oral TID  . insulin aspart  0-9 Units Subcutaneous TID WC  . isoniazid  600 mg Oral Q M,W,F  . multivitamin with minerals  1 tablet Oral Daily  . pyridOXINE  50 mg Oral Q M,W,F  . [START ON 05/15/2016] rifampin  450 mg Oral Q M,W,F  . saccharomyces boulardii  250 mg Oral BID   Continuous:  OZH:[id number]:acetaminophen **OR** acetaminophen  Assessment/Plan:  Active Problems:   Generalized weakness   Hypertension   GERD (gastroesophageal reflux disease)   Diabetes mellitus with neurological manifestations (HCC)   Dehydration with hyponatremia   Pancytopenia (HCC)    Protein-calorie malnutrition, severe   Tuberculosis   Thyroid nodule   Orthostatic hypotension    Dehydration with hyponatremia Suspect this is secondary to poor oral intake. Sodium level has improved. Continue IV fluids for now.  Continue IV fluids for now, feels much better for today. Still orthostatic. Check BMP in a.m., sodium is 126  Orthostatic hypotension Likely related to dehydration. Continues to be orthostatic. She did have an echocardiogram in August. Report was reviewed. Has normal systolic function. Grade 1 diastolic dysfunction. No significant valvular abnormalities. Mobilize per physical therapy. Orthostatic in a.m., still orthostatic, rifampin is known to cause dizziness and drowsiness. Cortisol level is appropriate at 8.9 at late morning.  Bilateral thigh pain. No abnormal findings on examination. She has good pulses. CK level is normal, this is resolved.  Abdominal pain She did undergo CT scan in October which did not show any acute findings. She does have minimal tenderness in the left lower quadrant. Proceed with abdominal films. She could be constipated.  Severe protein calorie malnutrition: eems to be a chronic problem. Unclear as to patient's home/social situation. May need to consult social work to assess that. Dietitian consultation to assess nutritional needs.  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 Managed by endocrinologist Dr. Georgian CoAngela Williamson whom she last saw 03/2016. A1c 03/2016:6.7. Apparently on Lantus in September. Low blood glucose levels this morning. Improved with oral challenge. Currently not on her Lantus. Continue to monitor CBG trend today.  Generalized weakness/dizziness/lethargy/AMS Etiology for these is unclear and could be related to all of the above. CT head did not show any acute findings. No clear indication to continue antibiotics.   Essential hypertension Soft blood pressures and orthostatic on admission. Hold  antihypertensives. IV fluids and reassess.  Pancytopenia/microcytosis Unclear etiology. Monitor counts closely.  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 Pharmacy has contacted health department. Please see their note. We will resume her oral agents. Her treatment apparently had been started in September 2017. She is now on a 2 drug regimen with isoniazid and rifampin being given 3 times a week as part of direct observed treatment.  Thyroid nodule As per PCP follow-up, normal thyroid panel which is to be repeated next year. Thyroid ultrasound pending.    DVT Prophylaxis: Lovenox    Code Status: Full code  Family Communication: Discussed with the patient's son  Disposition Plan: Management as outlined above. Mobilize. Continue IV fluids.     LOS: 2 days   [place] [facility] LLCELMAHI,Brendin Situ A  [facility] Pager [phone] 05/14/2016, 1:34 PM  If 7PM-7AM, please contact night-coverage at [email or web address], password [email or web address]

## 2016-05-15 DIAGNOSIS — K219 Gastro-esophageal reflux disease without esophagitis: Secondary | ICD-10-CM | POA: Diagnosis not present

## 2016-05-15 DIAGNOSIS — E860 Dehydration: Secondary | ICD-10-CM | POA: Diagnosis not present

## 2016-05-15 DIAGNOSIS — E1142 Type 2 diabetes mellitus with diabetic polyneuropathy: Secondary | ICD-10-CM | POA: Diagnosis not present

## 2016-05-15 DIAGNOSIS — R531 Weakness: Secondary | ICD-10-CM | POA: Diagnosis not present

## 2016-05-15 DIAGNOSIS — E871 Hypo-osmolality and hyponatremia: Secondary | ICD-10-CM | POA: Diagnosis not present

## 2016-05-15 LAB — PROCALCITONIN: Procalcitonin: <0.1 ng/mL

## 2016-05-15 LAB — BASIC METABOLIC PANEL
ANION GAP: 6 (ref 5–15)
BUN: 14 mg/dL (ref 6–20)
CALCIUM: 9 mg/dL (ref 8.9–10.3)
CHLORIDE: 98 mmol/L — AB (ref 101–111)
CO2: 29 mmol/L (ref 22–32)
CREATININE: 0.67 mg/dL (ref 0.44–1.00)
GFR calc Af Amer: >60 mL/min (ref >60)
GFR calc non Af Amer: >60 mL/min (ref >60)
Glucose, Bld: 161 mg/dL — ABNORMAL HIGH (ref 65–99)
Potassium: 4.6 mmol/L (ref 3.5–5.1)
SODIUM: 133 mmol/L — AB (ref 135–145)

## 2016-05-15 LAB — GLUCOSE, CAPILLARY
GLUCOSE-CAPILLARY: 158 mg/dL — AB (ref 65–99)
GLUCOSE-CAPILLARY: 285 mg/dL — AB (ref 65–99)

## 2016-05-15 MED ORDER — GABAPENTIN 300 MG PO CAPS
300.0000 mg | ORAL_CAPSULE | Freq: Two times a day (BID) | ORAL | Status: DC
Start: 2016-05-15 — End: 2016-06-09

## 2016-05-15 NOTE — Progress Notes (Signed)
Pt given discharge instructions and care notes. Pt verbalized understanding AEB no further questions or concerns at this time per interpreter at bedside. IV was discontinued, no redness, pain, or swelling noted at this time. Pt left the floor via wheelchair with staff in stable condition.

## 2016-05-15 NOTE — Progress Notes (Signed)
Occupational Therapy Treatment Patient Details Name: Olivia Williamson MRN: [id number] DOB: [birth year] Today's Date: 05/15/2016    History of present illness Olivia Williamson a 70 y.o.female, [place] speaking, lives with her son, ambulates with the help of a cane, PMH of DM 2, GERD, HTN, TB (as per report from [place] >treated for TB and no longer contagious) brought to [facility] ED for complaints of weakness, dehydration, and fatigue.    OT comments  Pt able to perform UB/LB dressing in standing and toilet transfer with supervision for safety. D/c plan remains appropriate. No further acute OT needs identified; signing off at this time. Please re-consult if needs change.   Follow Up Recommendations  No OT follow up;Supervision/Assistance - 24 hour    Equipment Recommendations  Other (comment) (2 wheeled RW)    Recommendations for Other Services      Precautions / Restrictions Precautions Precautions: Fall Restrictions Weight Bearing Restrictions: No       Mobility Bed Mobility Overal bed mobility: Modified Independent             General bed mobility comments: HOB elevated, no use of bed rail.  Transfers Overall transfer level: Needs assistance Equipment used: Rolling walker (2 wheeled) Transfers: Sit to/from Stand Sit to Stand: Supervision         General transfer comment: Supervision for safety; no physical assist required.     Balance Overall balance assessment: Needs assistance Sitting-balance support: Feet supported;No upper extremity supported Sitting balance-Leahy Scale: Good     Standing balance support: No upper extremity supported;During functional activity Standing balance-Leahy Scale: Fair                     ADL Overall ADL's : Needs assistance/impaired                 Upper Body Dressing : Supervision/safety;Standing   Lower Body Dressing: Supervision/safety Lower Body Dressing Details (indicate cue type and  reason): to don pants in standing and shoes in sitting Toilet Transfer: Supervision/safety;Ambulation;Regular Toilet;RW           Functional mobility during ADLs: Supervision/safety;Rolling walker        Vision                     Perception     Praxis      Cognition   Behavior During Therapy: WFL for tasks assessed/performed Overall Cognitive Status: Within Functional Limits for tasks assessed                       Extremity/Trunk Assessment               Exercises     Shoulder Instructions       General Comments      Pertinent Vitals/ Pain       Pain Assessment: Faces Pain Score: 0-No pain  Home Living                                          Prior Functioning/Environment              Frequency           Progress Toward Goals  OT Goals(current goals can now be found in the care plan section)  Progress towards OT goals: Goals met/education completed, patient discharged from OT  Acute Rehab  OT Goals Patient Stated Goal: home today OT Goal Formulation: All assessment and education complete, DC therapy  Plan Discharge plan remains appropriate;All goals met and education completed, patient discharged from OT services    Co-evaluation                 End of Session Equipment Utilized During Treatment: Rolling walker   Activity Tolerance Patient tolerated treatment well   Patient Left in bed;with family/visitor present;with call bell/phone within reach   Nurse Communication Mobility status;Other (comment) (pt ready for d/c)        Time: 8841-6606 OT Time Calculation (min): 9 min  Charges: OT General Charges $OT Visit: 1 Procedure OT Treatments $Self Care/Home Management : 8-22 mins  Olivia Williamson 05/15/2016, 2:33 PM

## 2016-05-15 NOTE — Care Management Note (Signed)
Case Management Note  Patient Details  Name: Olivia Williamson MRN: [id number] Date of Birth: 05-02-1946  Subjective/Objective:          Admitted with dehydration/ hypotension,  70 year old [place] female with a past medical history of diabetes, GERD, hypertension, tuberculosis.        Action/Plan: Plan is to d/c to home today with home health services.  Expected Discharge Date:   05/15/2016            Expected Discharge Plan:  Home/Self Care  In-House Referral:     Discharge planning Services  CM Consult  Post Acute Care Choice:  Home Health Choice offered to:  Adult Children, Patient  DME Arranged:  Walker rolling DME Agency:  [facility]  [facility] Arr[facility]  [facility]  Other - See comment Chip Boer([place])  Status of Service:  Completed, signed off  If discussed at Long Length of Stay Meetings, dates discussed:    Additional Comments:  Epifanio LeschesCole, Garrett Bowring Hudson, RN 05/15/2016, 3:48 PM

## 2016-05-15 NOTE — Discharge Summary (Signed)
Physician Discharge Summary  Olivia BuddMoihh Williamson ION:[id number]:[id number] DOB: 01-02-1946 DOA: 05/11/2016  PCP: Olivia Pennaharlotte Nche, NP  Admit date: 05/11/2016 Discharge date: 05/15/2016  Admitted From: Home Disposition: Home  Recommendations for Outpatient Follow-up:  1. Follow up with PCP in 1-2 weeks 2. Please obtain BMP/CBC in one week. 3. I went over discharge paperwork with the patient, her son in the presence of interpreter at bedside.  Home Health: NA Equipment/Devices:NA  Discharge Condition: Stable CODE STATUS: Full Code Diet recommendation: Diet Carb Modified Fluid consistency: Thin; Room service appropriate? Yes Diet - low sodium heart healthy  Brief/Interim Summary: 70 year old [place] female with a past medical history of diabetes, GERD, hypertension, tuberculosis undergoing active treatment per health department but no longer contagious, presented with complaints of weakness and fatigue. Patient was found to be dehydrated. Her orthostatic hypotension. She was hospitalized for further management.  Discharge Diagnoses:  Active Problems:   Generalized weakness   Hypertension   GERD (gastroesophageal reflux disease)   Diabetes mellitus with neurological manifestations (HCC)   Dehydration with hyponatremia   Pancytopenia (HCC)   Protein-calorie malnutrition, severe   Tuberculosis   Thyroid nodule   Orthostatic hypotension   Dehydration with hyponatremia Suspect this is secondary to poor oral intake. Sodium level has improved. Continue IV fluids for now.  Continue IV fluids for now, feels much better for today. Still orthostatic. Sodium level was 131 down to 126, with IV fluid hydration sodium up to 133. Dehydration resolved.  Orthostatic hypotension Presented with orthostatic hypotension likely secondary to volume depletion from dehydration. 2-D echo reviewed from August showed normal LVEF and diastolic function. Rifampin is known to cause dizziness and  drowsiness. Cortisol level is appropriate at 8.9 at late morning. This is resolved after aggressive hydration with IV fluids, was able to get up and ambulate with physical therapy  Bilateral thigh pain. No abnormal findings on examination. She has good pulses. CK level is normal, this is resolved. Language barrier even with the interpreter at bedside, patient described chronic right-sided leg pain sounds like sciatica.  Abdominal pain She did undergo CT scan in October which did not show any acute findings. She does have minimal tenderness in the left lower quadrant. Proceed with abdominal films. She could be constipated.  Severe protein calorie malnutrition: eems to be a chronic problem. Unclear as to patient's home/social situation. May need to consult social work to assess that. Dietitian consultation to assess nutritional needs.  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 Managed by endocrinologist Dr. Georgian CoAngela McClung whom she last saw 03/2016. A1c 03/2016:6.7. Apparently on Lantus in September. Low blood glucose levels this morning. Improved with oral challenge. Currently not on her Lantus. Continue to monitor CBG trend today.  Generalized weakness/dizziness/lethargy/AMS Etiology for these is unclear and could be related to all of the above. CT head did not show any acute findings. No clear indication to continue antibiotics.  Her presyncope and dizziness is likely secondary to volume depletion and orthostatic hypotension, this is resolved.  Essential hypertension Soft blood pressures and orthostatic on admission. Hold antihypertensives. IV fluids and reassess.  Pancytopenia/microcytosis Unclear etiology. Monitor counts closely.  Tuberculosis, pulmonary, receiving DOT  She is being treated for TB and is no longer contagious. Pharmacy has contacted health department. Please see their note. We will resume her oral agents. Her treatment apparently had been started in  September 2017. She is now on a 2 drug regimen with isoniazid and rifampin being given 3 times a week as part  of direct observed treatment.  Thyroid nodule As per PCP follow-up, normal thyroid panel which is to be repeated next year. Thyroid ultrasound pending.    Discharge Instructions  Discharge Instructions    Diet - low sodium heart healthy    Complete by:  As directed    Increase activity slowly    Complete by:  As directed      Allergies as of 05/15/2016      Reactions   Beef-derived Products Other (See Comments)   Generalized burning sensation   Eggs Or Egg-derived Products Other (See Comments)   Generalized burning sensation   Fish Allergy Other (See Comments)   Generalized burning sensation   Fruit & Vegetable Daily [nutritional Supplements] Other (See Comments)   Orange causes lower extremity burning      Medication List    TAKE these medications   Ferrous Gluconate 324 (37.5 Fe) MG Tabs Take 1 tablet (324 mg total) by mouth daily.   gabapentin 300 MG capsule Commonly known as:  NEURONTIN Take 1 capsule (300 mg total) by mouth 2 (two) times daily. What changed:  how much to take  when to take this   HYDROcodone-acetaminophen 5-325 MG tablet Commonly known as:  NORCO/VICODIN Take 1 tablet by mouth every 6 (six) hours as needed for pain.   Insulin Glargine 100 UNIT/ML Solostar Pen Commonly known as:  LANTUS SOLOSTAR Inject 25 Units into the skin daily at 10 pm.   isoniazid 300 MG tablet Commonly known as:  NYDRAZID Take 300 mg by mouth daily.   metFORMIN 1000 MG tablet Commonly known as:  GLUCOPHAGE Take 1,000 mg by mouth 2 (two) times daily with a meal.   pyrazinamide 500 MG tablet Take 1,000 mg by mouth daily.   pyridOXINE 50 MG tablet Commonly known as:  VITAMIN B-6 Take 50 mg by mouth daily.   rifampin 150 MG capsule Commonly known as:  RIFADIN Take 150 mg by mouth daily.   saccharomyces boulardii 250 MG capsule Commonly known as:   FLORASTOR Take 1 capsule (250 mg total) by mouth 2 (two) times daily. Any probiotic that is covered   traMADol 50 MG tablet Commonly known as:  ULTRAM Take 1 tablet (50 mg total) by mouth every 6 (six) hours as needed. What changed:  reasons to take this      Follow-up Information    Olivia Penna, NP Follow up in 1 week(s).   Specialty:  Internal Medicine Contact information: [address] [place] [address] [phone]          Allergies  Allergen Reactions  . Beef-Derived Products Other (See Comments)    Generalized burning sensation  . Eggs Or Egg-Derived Products Other (See Comments)    Generalized burning sensation  . Fish Allergy Other (See Comments)    Generalized burning sensation  . Fruit & Vegetable Daily [Nutritional Supplements] Other (See Comments)    Orange causes lower extremity burning    Consultations:  None  Procedures (Echo, Carotid, EGD, Colonoscopy, ERCP)   Radiological studies: Dg Chest 2 View  Result Date: 05/11/2016 CLINICAL DATA:  Weakness and lethargy. EXAM: CHEST  2 VIEW COMPARISON:  04/21/2016 FINDINGS: The cardiac silhouette appears enlarged though is accentuated by AP technique and shallow lung inflation. The lungs are less well inflated than on the prior study, and there is interstitial accentuation and bronchovascular crowding with slight nodularity in both lung bases, more so on the right. There is no evidence of pulmonary edema, pleural effusion, or  pneumothorax. Right upper quadrant abdominal surgical clips are noted. No acute osseous abnormality is seen. IMPRESSION: Hypoinflation with minimal bibasilar opacities which are favored to represent atelectasis though atypical infection is not completely excluded. Electronically Signed   By: Sebastian AcheAllen  Grady M.D.   On: 05/11/2016 14:36   Dg Chest 2 View  Result Date: 04/21/2016 CLINICAL DATA:  Chest pain and shortness of breath. Lightheaded and dizziness. History of TB. EXAM: CHEST  2  VIEW COMPARISON:  Chest 04/13/2016.  CT chest 02/25/2016 FINDINGS: Normal heart size and pulmonary vascularity. Nodularity in the right lung base is unchanged since prior studies. No developing infiltration or atelectasis. No blunting of costophrenic angles. No pneumothorax. Mediastinal contours appear intact. Calcification of the aorta. IMPRESSION: No evidence of active pulmonary disease. Nodularity in the right lung base is unchanged since prior studies. Electronically Signed   By: Burman NievesWilliam  Stevens M.D.   On: 04/21/2016 01:28   Ct Head Wo Contrast  Result Date: 05/11/2016 CLINICAL DATA:  Headache and weakness. EXAM: CT HEAD WITHOUT CONTRAST TECHNIQUE: Contiguous axial images were obtained from the base of the skull through the vertex without intravenous contrast. COMPARISON:  None. FINDINGS: Brain: No mass lesion, intraparenchymal hemorrhage or extra-axial collection. No evidence of acute cortical infarct. There is periventricular hypoattenuation compatible with chronic microvascular disease. Vascular: No hyperdense vessel or unexpected calcification. Skull: Normal visualized skull base, calvarium and extracranial soft tissues. Sinuses/Orbits: No sinus fluid levels or advanced mucosal thickening. No mastoid effusion. Normal orbits. IMPRESSION: Chronic microvascular ischemia without acute intracranial abnormality. Electronically Signed   By: Deatra RobinsonKevin  Herman M.D.   On: 05/11/2016 18:13   Ct Angio Chest/abd/pel For Dissection W And/or Wo Contrast  Result Date: 04/21/2016 CLINICAL DATA:  Gradual onset moderate right chest pain with onset yesterday afternoon. Pain radiates to abdomen and lower back. Shortness of breath, lightheadedness, and dizziness. History of hypertension, diabetes, TB, hysterectomy, cholecystectomy. EXAM: CT ANGIOGRAPHY CHEST, ABDOMEN AND PELVIS TECHNIQUE: Multidetector CT imaging through the chest, abdomen and pelvis was performed using the standard protocol during bolus administration of  intravenous contrast. Multiplanar reconstructed images and MIPs were obtained and reviewed to evaluate the vascular anatomy. CONTRAST:  75 mL Isovue 370 COMPARISON:  02/25/2016 FINDINGS: CTA CHEST FINDINGS Cardiovascular: Noncontrast CT images of the chest demonstrate mild scattered aortic calcification. No evidence of intramural hematoma. Images obtained after contrast administration in the arterial phase demonstrate normal caliber thoracic aorta. No aortic aneurysm or dissection. Great vessel origins are patent. Central pulmonary arteries are well opacified without evidence of significant pulmonary embolus. Mildly dilated main pulmonary artery may suggest pulmonary arterial hypertension. Normal heart size. No pericardial effusion. Mediastinum/Nodes: 2 cm nodule in the left thyroid gland. No change since prior study. No significant lymphadenopathy in the chest. Esophagus is decompressed. Lungs/Pleura: Mild dependent changes in the lung bases. Nodular infiltrates seen previously in the left upper lung are improving. Mild residual nodular infiltrative change in the superior segment of the left upper lung, decreasing since previous study suggesting inflammatory process. Persistent focal nodularity and scarring in the right lung base with solid nodule measuring about 14 mm diameter, unchanged since previous study. Adjacent cavitary nodule measuring 9 mm diameter. The cavity appears smaller than on previous study, possibly representing a resolving inflammatory focus. Continued surveillance of this area is recommended with 3-6 month follow-up study suggested. No focal consolidation. Calcified granulomas in the right lung. Musculoskeletal: No chest wall abnormality. No acute or significant osseous findings. Review of the MIP images confirms the above findings. CTA  ABDOMEN AND PELVIS FINDINGS VASCULAR Aorta: Normal caliber aorta without aneurysm, dissection, vasculitis or significant stenosis. Celiac: Patent without  evidence of aneurysm, dissection, vasculitis or significant stenosis. SMA: Patent without evidence of aneurysm, dissection, vasculitis or significant stenosis. Renals: Both renal arteries are patent without evidence of aneurysm, dissection, vasculitis, fibromuscular dysplasia or significant stenosis. IMA: Patent without evidence of aneurysm, dissection, vasculitis or significant stenosis. Inflow: Patent without evidence of aneurysm, dissection, vasculitis or significant stenosis. Veins: No obvious venous abnormality within the limitations of this arterial phase study. Review of the MIP images confirms the above findings. NON-VASCULAR Hepatobiliary: Diffuse fatty infiltration of the liver. Scattered sub cm nodules demonstrated throughout the liver are too small to characterize but likely represent small cysts or hemangiomas. No change since prior study. Gallbladder is surgically absent. No bile duct dilatation. Pancreas: Unremarkable. No pancreatic ductal dilatation or surrounding inflammatory changes. Spleen: Normal in size without focal abnormality. Adrenals/Urinary Tract: Adrenal glands are unremarkable. Kidneys are normal, without renal calculi, focal lesion, or hydronephrosis. Bladder is unremarkable. Stomach/Bowel: Stomach is within normal limits. Appendix appears normal. No evidence of bowel wall thickening, distention, or inflammatory changes. Lymphatic: No significant vascular findings are present. No enlarged abdominal or pelvic lymph nodes. Reproductive: Calcifications in the uterus are likely vascular. Uterus and ovaries are not enlarged. Small amount of free fluid in the pelvis is nonspecific possibly inflammatory. Other: No free air in the abdomen. Abdominal wall musculature appears intact. Musculoskeletal: No acute or significant osseous findings. Calcification in the subcutaneous fat over the right gluteal region probably represents injection granuloma. Review of the MIP images confirms the above  findings. IMPRESSION: No evidence of aneurysm or dissection in the thoracic or abdominal aorta. Improving inflammatory infiltrates in the left lung. Soft tissue nodule and cavitary nodule in the right lower lung again noted. Soft tissue nodule is unchanged. Cavitary nodule appears slightly smaller. This may indicate inflammatory etiology. Neoplasm not entirely excluded. Follow-up in 3-6 months recommended. Diffuse fatty infiltration of the liver. Electronically Signed   By: Burman Nieves M.D.   On: 04/21/2016 05:04     Subjective:  Discharge Exam: Vitals:   05/14/16 1647 05/14/16 2121 05/15/16 0500 05/15/16 0610  BP: 110/62 (!) 151/61  (!) 160/56  Pulse: 85 80  79  Resp: 19 16  16   Temp: 98.2 F (36.8 C) 98.4 F (36.9 C)  98.8 F (37.1 C)  TempSrc: Oral Oral  Oral  SpO2: 98% 97%  99%  Weight:   37.7 kg (83 lb 1.6 oz)   Height:       General: Pt is alert, awake, not in acute distress Cardiovascular: RRR, S1/S2 +, no rubs, no gallops Respiratory: CTA bilaterally, no wheezing, no rhonchi Abdominal: Soft, NT, ND, bowel sounds + Extremities: no edema, no cyanosis   The results of significant diagnostics from this hospitalization (including imaging, microbiology, ancillary and laboratory) are listed below for reference.    Microbiology: No results found for this or any previous visit (from the past 240 hour(s)).   Labs: BNP (last 3 results)  Recent Labs  04/21/16 0045  BNP 18.8   Basic Metabolic Panel:  Recent Labs Lab 05/11/16 1135 05/12/16 0527 05/13/16 0602 05/14/16 0507 05/15/16 0520  NA 130* 134* 129* 126* 133*  K 3.5 3.4* 4.5 4.2 4.6  CL 96* 102 93* 88* 98*  CO2 29 25 29 31 29   GLUCOSE 198* 50* 123* 173* 161*  BUN 12 8 14 17 14   CREATININE 0.68 0.47 0.63 0.68 0.67  CALCIUM 9.1 8.5* 9.1 9.2 9.0   Liver Function Tests:  Recent Labs Lab 05/11/16 1135  AST 26  ALT 25  ALKPHOS 58  BILITOT 0.7  PROT 5.7*  ALBUMIN 3.1*   No results for input(s):  LIPASE, AMYLASE in the last 168 hours. No results for input(s): AMMONIA in the last 168 hours. CBC:  Recent Labs Lab 05/11/16 1135 05/12/16 0527 05/13/16 0602  WBC 3.1* 3.1* 5.2  NEUTROABS 1.9  --   --   HGB 9.2* 8.6* 8.8*  HCT 26.6* 25.0* 25.8*  MCV 65.8* 65.6* 65.8*  PLT 102* 85* 85*   Cardiac Enzymes:  Recent Labs Lab 05/12/16 0934  CKTOTAL 63   BNP: Invalid input(s): POCBNP CBG:  Recent Labs Lab 05/14/16 0818 05/14/16 1217 05/14/16 1642 05/14/16 2121 05/15/16 0829  GLUCAP 201* 339* 236* 234* 158*   D-Dimer No results for input(s): DDIMER in the last 72 hours. Hgb A1c No results for input(s): HGBA1C in the last 72 hours. Lipid Profile No results for input(s): CHOL, HDL, LDLCALC, TRIG, CHOLHDL, LDLDIRECT in the last 72 hours. Thyroid function studies No results for input(s): TSH, T4TOTAL, T3FREE, THYROIDAB in the last 72 hours.  Invalid input(s): FREET3 Anemia work up No results for input(s): VITAMINB12, FOLATE, FERRITIN, TIBC, IRON, RETICCTPCT in the last 72 hours. Urinalysis    Component Value Date/Time   COLORURINE STRAW (A) 05/11/2016 1335   APPEARANCEUR CLEAR 05/11/2016 1335   LABSPEC 1.003 (L) 05/11/2016 1335   PHURINE 6.0 05/11/2016 1335   GLUCOSEU 150 (A) 05/11/2016 1335   HGBUR NEGATIVE 05/11/2016 1335   BILIRUBINUR NEGATIVE 05/11/2016 1335   KETONESUR NEGATIVE 05/11/2016 1335   PROTEINUR NEGATIVE 05/11/2016 1335   NITRITE NEGATIVE 05/11/2016 1335   LEUKOCYTESUR NEGATIVE 05/11/2016 1335   Sepsis Labs Invalid input(s): PROCALCITONIN,  WBC,  LACTICIDVEN Microbiology No results found for this or any previous visit (from the past 240 hour(s)).   Time coordinating discharge: Over 30 minutes  SIGNED:   Clint Lipps, MD  [facility] 05/15/2016, 11:28 AM Pager   If 7PM-7AM, please contact night-coverage [email or web address] Password [email or web address]

## 2016-05-15 NOTE — Progress Notes (Signed)
Inpatient Diabetes Program Recommendations  AACE/ADA: New Consensus Statement on Inpatient Glycemic Control (2015)  Target Ranges:  Prepandial:   less than 140 mg/dL      Peak postprandial:   less than 180 mg/dL (1-2 hours)      Critically ill patients:  140 - 180 mg/dL   Lab Results  Component Value Date   GLUCAP 158 (H) 05/15/2016   HGBA1C 6.7 03/27/2016   Results for [facility]YA, [facility] (MRN [id number]) as of 05/15/2016 10:46  Ref. Range 05/14/2016 08:18 05/14/2016 12:17 05/14/2016 16:42 05/14/2016 21:21 05/15/2016 08:29  Glucose-Capillary Latest Ref Range: 65 - 99 mg/dL 409201 (H) 811339 (H) 914236 (H) 234 (H) 158 (H)   Review of Glycemic Control  Diabetes history:     DM, BUN and Creatinine WNL Outpatient Diabetes medications:     Metformin 1000 mg BID, Lantus 25 units QHS Current orders for Inpatient glycemic control:     Novolog Sensitive Correction TID    Carb Mod diet  Inpatient Diabetes Program Recommendations:    For D/C, please consider decreasing home Lantus dose.  Thank you,  Kristine LineaKaren Tahje Borawski, RN, MSN Diabetes Coordinator Inpatient Diabetes Program [phone] (Team Pager)

## 2016-05-20 ENCOUNTER — Inpatient Hospital Stay: Payer: Medicare Other | Admitting: Nurse Practitioner

## 2016-05-21 ENCOUNTER — Telehealth: Payer: Self-pay | Admitting: Nurse Practitioner

## 2016-05-21 NOTE — Telephone Encounter (Signed)
Home health was recommended after her hospitalization, so she will need to return to office for hospital follow up first. Thank you

## 2016-05-21 NOTE — Telephone Encounter (Signed)
[place] [place] [facility] Pt is in need of them and she did not show up for her appt yesterday. Are you [facility]? She was last seen by you on 11/17. He states Medicare requires face to face within 90 days. Please advise

## 2016-05-22 ENCOUNTER — Encounter (HOSPITAL_COMMUNITY): Payer: Self-pay | Admitting: Emergency Medicine

## 2016-05-22 ENCOUNTER — Emergency Department (HOSPITAL_COMMUNITY): Payer: Medicare Other

## 2016-05-22 ENCOUNTER — Emergency Department (HOSPITAL_COMMUNITY)
Admission: EM | Admit: 2016-05-22 | Discharge: 2016-05-22 | Disposition: A | Discharge status: Home / Self Care | Payer: Medicare Other | Attending: Emergency Medicine | Admitting: Emergency Medicine

## 2016-05-22 DIAGNOSIS — E119 Type 2 diabetes mellitus without complications: Secondary | ICD-10-CM | POA: Insufficient documentation

## 2016-05-22 DIAGNOSIS — Z79899 Other long term (current) drug therapy: Secondary | ICD-10-CM | POA: Diagnosis not present

## 2016-05-22 DIAGNOSIS — R1084 Generalized abdominal pain: Secondary | ICD-10-CM | POA: Diagnosis not present

## 2016-05-22 DIAGNOSIS — I10 Essential (primary) hypertension: Secondary | ICD-10-CM | POA: Diagnosis not present

## 2016-05-22 DIAGNOSIS — Z794 Long term (current) use of insulin: Secondary | ICD-10-CM | POA: Diagnosis not present

## 2016-05-22 LAB — CBC WITH DIFFERENTIAL/PLATELET
Basophils Absolute: 0 10*3/uL (ref 0.0–0.1)
Basophils Relative: 0 %
Eosinophils Absolute: 0.1 10*3/uL (ref 0.0–0.7)
Eosinophils Relative: 2 %
HCT: 28.7 % — ABNORMAL LOW (ref 36.0–46.0)
Hemoglobin: 9.7 g/dL — ABNORMAL LOW (ref 12.0–15.0)
Lymphocytes Relative: 16 %
Lymphs Abs: 0.9 10*3/uL (ref 0.7–4.0)
MCH: 22.5 pg — ABNORMAL LOW (ref 26.0–34.0)
MCHC: 33.8 g/dL (ref 30.0–36.0)
MCV: 66.6 fL — ABNORMAL LOW (ref 78.0–100.0)
Monocytes Absolute: 0.5 10*3/uL (ref 0.1–1.0)
Monocytes Relative: 9 %
Neutro Abs: 3.9 10*3/uL (ref 1.7–7.7)
Neutrophils Relative %: 73 %
Platelets: 108 10*3/uL — ABNORMAL LOW (ref 150–400)
RBC: 4.31 MIL/uL (ref 3.87–5.11)
RDW: 15.4 % (ref 11.5–15.5)
WBC: 5.4 10*3/uL (ref 4.0–10.5)

## 2016-05-22 LAB — COMPREHENSIVE METABOLIC PANEL
ALT: 20 U/L (ref 14–54)
AST: 23 U/L (ref 15–41)
Albumin: 3.2 g/dL — ABNORMAL LOW (ref 3.5–5.0)
Alkaline Phosphatase: 59 U/L (ref 38–126)
Anion gap: 6 (ref 5–15)
BUN: 18 mg/dL (ref 6–20)
CO2: 29 mmol/L (ref 22–32)
Calcium: 8.8 mg/dL — ABNORMAL LOW (ref 8.9–10.3)
Chloride: 91 mmol/L — ABNORMAL LOW (ref 101–111)
Creatinine, Ser: 0.48 mg/dL (ref 0.44–1.00)
GFR calc Af Amer: >60 mL/min (ref >60)
GFR calc non Af Amer: >60 mL/min (ref >60)
Glucose, Bld: 128 mg/dL — ABNORMAL HIGH (ref 65–99)
Potassium: 4.3 mmol/L (ref 3.5–5.1)
Sodium: 126 mmol/L — ABNORMAL LOW (ref 135–145)
Total Bilirubin: 0.6 mg/dL (ref 0.3–1.2)
Total Protein: 6.3 g/dL — ABNORMAL LOW (ref 6.5–8.1)

## 2016-05-22 LAB — URINALYSIS, ROUTINE W REFLEX MICROSCOPIC
Bilirubin Urine: NEGATIVE
Glucose, UA: NEGATIVE mg/dL
Hgb urine dipstick: NEGATIVE
Ketones, ur: NEGATIVE mg/dL
Leukocytes, UA: NEGATIVE
Nitrite: NEGATIVE
Protein, ur: NEGATIVE mg/dL
Specific Gravity, Urine: 1.011 (ref 1.005–1.030)
pH: 7 (ref 5.0–8.0)

## 2016-05-22 LAB — LIPASE, BLOOD: Lipase: 25 U/L (ref 11–51)

## 2016-05-22 MED ORDER — FENTANYL CITRATE (PF) 100 MCG/2ML IJ SOLN
50.0000 ug | Freq: Once | INTRAMUSCULAR | Status: AC
  Administered 2016-05-22: 50 ug via INTRAVENOUS
  Filled 2016-05-22: qty 2

## 2016-05-22 MED ORDER — PROMETHAZINE HCL 25 MG PO TABS: 25.0000 mg | ORAL_TABLET | Freq: Three times a day (TID) | ORAL | 0 refills | Status: DC | PRN

## 2016-05-22 MED ORDER — IOPAMIDOL (ISOVUE-300) INJECTION 61%
INTRAVENOUS | Status: AC
  Filled 2016-05-22: qty 100

## 2016-05-22 MED ORDER — IOPAMIDOL (ISOVUE-300) INJECTION 61%
100.0000 mL | Freq: Once | INTRAVENOUS | Status: AC | PRN
  Administered 2016-05-22: 50 mL via INTRAVENOUS

## 2016-05-22 MED ORDER — TRAMADOL HCL 50 MG PO TABS: 50.0000 mg | ORAL_TABLET | Freq: Four times a day (QID) | ORAL | 0 refills | Status: DC | PRN

## 2016-05-22 MED ORDER — ONDANSETRON HCL 4 MG/2ML IJ SOLN
4.0000 mg | Freq: Once | INTRAMUSCULAR | Status: AC
  Administered 2016-05-22: 4 mg via INTRAVENOUS
  Filled 2016-05-22: qty 2

## 2016-05-22 NOTE — ED Notes (Signed)
Pt aware of need for urine  

## 2016-05-22 NOTE — ED Notes (Signed)
Bed: [id number] Expected date:  Expected time:  Means of arrival:  Comments: [id number] yo abd pain

## 2016-05-22 NOTE — ED Triage Notes (Addendum)
Per EMS, pt from home c/o low abdominal pain, dizziness x 3 months, worsened today. Difficult to stand due to dizziness. No n/v/diarrhea, fever, other complaints. Pt speaks [place] only, unable to obtain interpreter for this language from either of ED's interpreter services. Current TB infection, declared noninfectious by health department.

## 2016-05-22 NOTE — ED Notes (Signed)
[place]

## 2016-05-22 NOTE — Discharge Instructions (Signed)
Return here as needed.  Follow-up with your primary care doctor.  Your CT scan was normal

## 2016-05-22 NOTE — ED Triage Notes (Signed)
Per EMS, pt complains of RUQ abd pain x 2 days. Pt was hospitalized last week for hyponatremia/dehydration. Pt states she had diarrhea yesterday/ Pt ate ham sandwich this morning. Pt was able to ambulate on scene with assistance. Pt speaks vietnamese.

## 2016-05-25 NOTE — ED Provider Notes (Signed)
[facility]-EMERGENCY DEPT Provider Note   CSN: [id number] Arrival date & time: 05/22/16  1238     History   Chief Complaint Chief Complaint  Patient presents with  . Abdominal Pain    HPI Olivia Williamson is a 71 y.o. female.  HPI Patient presents to the emergency department with chronic abdominal pain that she states is been worse over the last 3 days.  The patient states that nothing seems make the condition better or worse.  She states she did not take any medications prior to arrivalThe patient denies chest pain, shortness of breath, headache,blurred vision, neck pain, fever, cough, weakness, numbness, dizziness, anorexia, edema,vomiting, diarrhea, rash, back pain, dysuria, hematemesis, bloody stool, near syncope, or syncope. The patient states that her abdominal pain is diffuse and not specific to one area with no radiation of her pain.  This history is obtained through a translator Past Medical History:  Diagnosis Date  . Arthritis   . Diabetes mellitus without complication (HCC)    stopped DM meds 2004  . GERD (gastroesophageal reflux disease)   . Hypertension    meds stopped 1 week ago , need to get renewed  . Tuberculosis    ACTIVE    Patient Active Problem List   Diagnosis Date Noted  . Orthostatic hypotension 05/11/2016  . Thyroid nodule 04/13/2016  . Postmenopausal status 04/13/2016  . Chest pain   . Abnormal CT of the chest   . Tuberculosis 01/09/2016  . Protein-calorie malnutrition, severe 12/16/2015  . Generalized weakness 12/15/2015  . Dehydration with hyponatremia 12/15/2015  . Pancytopenia (HCC) 12/15/2015  . Hyperglycemia 12/15/2015  . Nausea without vomiting 12/15/2015  . Hypertension   . GERD (gastroesophageal reflux disease)   . Diabetes mellitus with neurological manifestations [facility])     Past Surgical History:  Procedure Laterality Date  . ABDOMINAL HYSTERECTOMY    . CHOLECYSTECTOMY N/A 05/02/2014   Procedure: LAPAROSCOPIC CHOLECYSTECTOMY ;  Surgeon:  Abigail Miyamoto, MD;  Location: [facility] OR;  Service: General;  Laterality: N/A;  laparoscopic cholecystectomy  . COLONOSCOPY    . NO PAST SURGERIES    . VIDEO BRONCHOSCOPY Bilateral 01/10/2016   Procedure: VIDEO BRONCHOSCOPY WITHOUT FLUORO;  Surgeon: Leslye Peer, MD;  Location[facility] ENDOSCOPY;  Service: Cardiopulmonary;  Laterality: Bilateral;    OB History    No data available       Home Medications    Prior to Admission medications   Medication Sig Start Date End Date Taking? Authorizing Provider  gabapentin (NEURONTIN) 300 MG capsule Take 1 capsule (300 mg total) by mouth 2 (two) times daily. Patient taking differently: Take 600 mg by mouth 3 (three) times daily.  05/15/16  Yes Clydia Llano, MD  Insulin Glargine (LANTUS SOLOSTAR) 100 UNIT/ML Solostar Pen Inject 25 Units into the skin daily at 10 pm. 03/27/16  Yes Anders Simmonds, PA-C  pyridOXINE (VITAMIN B-6) 50 MG tablet Take 50 mg by mouth every Monday, Wednesday, and Friday.    Yes Historical Provider, MD  rifampin (RIFADIN) 300 MG capsule Take 300 mg by mouth 3 (three) times a week. Take along with 150 mg capsule=450 mg on MWF   Yes Historical Provider, MD  Ferrous Gluconate 324 (37.5 Fe) MG TABS Take 1 tablet (324 mg total) by mouth daily. Patient not taking: Reported on 05/22/2016 12/30/15   Pete Glatter, MD  HYDROcodone-acetaminophen (NORCO/VICODIN) 5-325 MG tablet Take 1 tablet by mouth every 6 (six) hours as needed for pain. 04/21/16   Historical Provider, MD  isoniazid (NYDRAZID) 300 MG tablet Take 600 mg by mouth 3 (three) times a week. MWF    Historical Provider, MD  promethazine (PHENERGAN) 25 MG tablet Take 1 tablet (25 mg total) by mouth every 8 (eight) hours as needed for nausea or vomiting. 05/22/16   Charlestine Nighthristopher Ansen Sayegh, PA-C  rifampin (RIFADIN) 150 MG capsule Take 150 mg by mouth 3 (three) times a week. Take along with 300 mg capsule=450 mg on MWF    Historical Provider, MD  saccharomyces boulardii (FLORASTOR) 250 MG  capsule Take 1 capsule (250 mg total) by mouth 2 (two) times daily. Any probiotic that is covered Patient not taking: Reported on 05/22/2016 01/16/16   Leroy SeaPrashant K Singh, MD  traMADol (ULTRAM) 50 MG tablet Take 1 tablet (50 mg total) by mouth every 6 (six) hours as needed for severe pain. 05/22/16   Charlestine Nighthristopher Jadarion Halbig, PA-C    Family History Family History  Problem Relation Age of Onset  . Heart disease Maternal Uncle 5670    Social History Social History  Substance Use Topics  . Smoking status: Never Smoker  . Smokeless tobacco: Never Used  . Alcohol use No     Allergies   Beef-derived products; Eggs or egg-derived products; Fish allergy; and Fruit & vegetable daily [nutritional supplements]   Review of Systems Review of Systems All other systems negative except as documented in the HPI. All pertinent positives and negatives as reviewed in the HPI.  Physical Exam Updated Vital Signs BP 149/69   Pulse 70   Temp 97.7 F (36.5 C) (Oral)   Resp 18   LMP 05/11/2016   SpO2 100%   Physical Exam  Constitutional: She is oriented to person, place, and time. She appears well-developed and well-nourished. No distress.  HENT:  Head: Normocephalic and atraumatic.  Mouth/Throat: Oropharynx is clear and moist.  Eyes: Pupils are equal, round, and reactive to light.  Neck: Normal range of motion. Neck supple.  Cardiovascular: Normal rate, regular rhythm and normal heart sounds.  Exam reveals no gallop and no friction rub.   No murmur heard. Pulmonary/Chest: Effort normal and breath sounds normal. No respiratory distress. She has no wheezes.  Abdominal: Soft. Bowel sounds are normal. She exhibits no distension. There is tenderness. There is no rebound and no guarding. No hernia.  Neurological: She is alert and oriented to person, place, and time. She exhibits normal muscle tone. Coordination normal.  Skin: Skin is warm and dry. No rash noted. No erythema.  Psychiatric: She has a normal  mood and affect. Her behavior is normal.  Nursing note and vitals reviewed.    ED Treatments / Results  Labs (all labs ordered are listed, but only abnormal results are displayed) Labs Reviewed  CBC WITH DIFFERENTIAL/PLATELET - Abnormal; Notable for the following:       Result Value   Hemoglobin 9.7 (*)    HCT 28.7 (*)    MCV 66.6 (*)    MCH 22.5 (*)    Platelets 108 (*)    All other components within normal limits  COMPREHENSIVE METABOLIC PANEL - Abnormal; Notable for the following:    Sodium 126 (*)    Chloride 91 (*)    Glucose, Bld 128 (*)    Calcium 8.8 (*)    Total Protein 6.3 (*)    Albumin 3.2 (*)    All other components within normal limits  URINALYSIS, ROUTINE W REFLEX MICROSCOPIC  LIPASE, BLOOD    EKG  EKG Interpretation None  Radiology No results found.  Procedures Procedures (including critical care time)  Medications Ordered in ED Medications  fentaNYL (SUBLIMAZE) injection 50 mcg (50 mcg Intravenous Given 05/22/16 1517)  ondansetron (ZOFRAN) injection 4 mg (4 mg Intravenous Given 05/22/16 1515)  fentaNYL (SUBLIMAZE) injection 50 mcg (50 mcg Intravenous Given 05/22/16 1846)  iopamidol (ISOVUE-300) 61 % injection 100 mL (50 mLs Intravenous Contrast Given 05/22/16 1822)     Initial Impression / Assessment and Plan / ED Course  I have reviewed the triage vital signs and the nursing notes.  Pertinent labs & imaging results that were available during my care of the patient were reviewed by me and considered in my medical decision making (see chart for details).  Clinical Course    Patient has a negative workup and we will have the patient follow up with her primary care Dr. Catalina Pizza to return here as needed.  Patient agrees the plan.  All questions were answered.  I advised the patient of the results.  I feel this is an exacerbation of her chronic abdominal pain  Final Clinical Impressions(s) / ED Diagnoses   Final diagnoses:  Generalized  abdominal pain    New Prescriptions Discharge Medication List as of 05/22/2016  7:51 PM    START taking these medications   Details  promethazine (PHENERGAN) 25 MG tablet Take 1 tablet (25 mg total) by mouth every 8 (eight) hours as needed for nausea or vomiting., Starting Fri 05/22/2016, Print         [place], PA-C 05/25/16 0147    Loren Racer, MD 05/29/16 1020

## 2016-05-27 ENCOUNTER — Encounter: Payer: Self-pay | Admitting: Nurse Practitioner

## 2016-05-27 NOTE — Progress Notes (Signed)
Late entry for missed gcode     05/13/16 1621  OT Time Calculation  OT Start Time (ACUTE ONLY) 1604  OT Stop Time (ACUTE ONLY) 1617  OT Time Calculation (min) 13 min  OT G-codes **NOT FOR INPATIENT CLASS**  Functional Assessment Tool Used Clinical judgement  Functional Limitation Self care  Self Care Current Status (Z6109(G8987) CI  Self Care Goal Status (U0454(G8988) CI  Self Care Discharge Status (U9811(G8989) CI  OT General Charges  $OT Visit 1 Procedure  OT Evaluation  $OT Eval Moderate Complexity 1 Procedure   Hurshel PartyBailey Linnet Bottari M.S., OTR/L Pager: [phone]

## 2016-05-28 ENCOUNTER — Telehealth: Payer: Self-pay | Admitting: Nurse Practitioner

## 2016-05-28 ENCOUNTER — Telehealth: Payer: Self-pay | Admitting: Internal Medicine

## 2016-05-28 DIAGNOSIS — R5381 Other malaise: Secondary | ICD-10-CM

## 2016-05-28 NOTE — Telephone Encounter (Signed)
Will see if we can schedule pt an appointment with you if you have any openings

## 2016-05-28 NOTE — Progress Notes (Signed)
Olivia NuttingRenee Williamson CM [facility] left VM 05/27/16 that HH was not initiated after DC.  LM with Olivia Nuttingenee Williamson [phone] providing contact info for Olivia AdamDrew Williamson [phone] clinical liaison [facility]. Also notified [facility]rew [facility] and provided him with Olivia's contact info to obtain additional/ necessary information to initiate HH.

## 2016-05-28 NOTE — Telephone Encounter (Signed)
error 

## 2016-05-28 NOTE — Telephone Encounter (Signed)
[name] from the [facility] Department calling in regards to home health orders States when pt was last in the hospital they were supposed to put in home health orders but orders were either not placed or they reached out to the incorrect provider   [name] was informed that pt has recently been seen at [facility] and her PCP was switched. [name] states that pt does not want to switch PCP's. [facility] and is unsure of why providers were changed.   Since pt was last seen in August, does pt need an appointment before orders can be placed?

## 2016-05-28 NOTE — Telephone Encounter (Signed)
Ok. I also put in for Home health care orders, but not certain if they will take since been a while since we last saw. May need appt again soon.

## 2016-05-29 ENCOUNTER — Telehealth: Payer: Self-pay | Admitting: Nurse Practitioner

## 2016-05-29 ENCOUNTER — Telehealth: Payer: Self-pay

## 2016-05-29 NOTE — Telephone Encounter (Signed)
Could you schedule appointment.

## 2016-05-29 NOTE — Telephone Encounter (Signed)
Dr. Neomia GlassBauchmann has called with concerns about the patients health. She said that she's treating the patient for Tb. And has other health concerns that he wants to speak to  Dr. Julien NordmannLangeland about other health concerns  I explained to Dr. Neomia GlassBauchmann that the patient has not seen Dr Julien NordmannLangeland and that she simply has an upcoming appt with her. I explained to her that oatient has a recent and has upcoming appts with [facility] and suggested that she contact the doctor their because they are listed as her PCP.   Please follow with Dr. Neomia GlassBauchmann [phone].      Signed - Idelle Leechhadelle Brown

## 2016-05-29 NOTE — Telephone Encounter (Signed)
Home health referral received from Dr. Julien NordmannLangeland. Patient has not had an office visit with Dr. Julien NordmannLangeland since 12/30/15. Patient has to have had a face to face office visit with provider in the last 90 days to receive home health services so unable to arrange home health services for patient at this time. Patient has an appointment scheduled on 06/09/16 at 1430 with Dr. Julien NordmannLangeland. Will route to Dr. Julien NordmannLangeland.

## 2016-05-29 NOTE — Telephone Encounter (Signed)
Pt has appointment scheduled on Jan 16th at 2:30 with PCP

## 2016-05-29 NOTE — Telephone Encounter (Signed)
Dr. Erma HeritageBachmann called stating that she has suspected an underlying health concern with patient. Please call Dr. To f/u. If Dr is unable to answer, please leave a voicemail.

## 2016-05-30 ENCOUNTER — Other Ambulatory Visit: Payer: Self-pay | Admitting: Internal Medicine

## 2016-06-01 NOTE — Telephone Encounter (Signed)
Thanks for the update

## 2016-06-02 ENCOUNTER — Telehealth: Payer: Self-pay

## 2016-06-02 ENCOUNTER — Other Ambulatory Visit: Payer: Self-pay | Admitting: Internal Medicine

## 2016-06-02 DIAGNOSIS — E871 Hypo-osmolality and hyponatremia: Secondary | ICD-10-CM

## 2016-06-02 NOTE — Telephone Encounter (Signed)
Dr Neomia GlassBauchmann called and I was able to speak w/ her this am. She is concerned about pt's hyponatremia and possible adrenal insufficiency. Pt has about 2-3 more months of TB treatment, but has been back to ED frequently for numerous things (back pain, hyponatremia, possible dehydration) w/ problems w/ f/u. I initially saw her, but than on hospital dc, she was sent [facility]. Pt requested to come back to see me due to ease of language (I also speak [place]).  She has appt w/ me 06/10/15. Put in future lab orders, cortisol/acth/na/urine and serum osm/cbc/cmp.  Depending on labs, may need cortisol stim test as well.

## 2016-06-05 ENCOUNTER — Other Ambulatory Visit: Payer: Self-pay | Admitting: Internal Medicine

## 2016-06-08 ENCOUNTER — Telehealth: Payer: Self-pay

## 2016-06-09 ENCOUNTER — Telehealth: Payer: Self-pay | Admitting: Internal Medicine

## 2016-06-09 ENCOUNTER — Ambulatory Visit: Payer: Self-pay | Admitting: Internal Medicine

## 2016-06-09 ENCOUNTER — Ambulatory Visit: Payer: Medicare Other | Attending: Internal Medicine | Admitting: Internal Medicine

## 2016-06-09 ENCOUNTER — Encounter: Payer: Self-pay | Admitting: Internal Medicine

## 2016-06-09 VITALS — BP 124/75 | HR 87 | Temp 97.7°F | Resp 16 | Wt 81.2 lb

## 2016-06-09 DIAGNOSIS — Z79899 Other long term (current) drug therapy: Secondary | ICD-10-CM | POA: Insufficient documentation

## 2016-06-09 DIAGNOSIS — F329 Major depressive disorder, single episode, unspecified: Secondary | ICD-10-CM | POA: Insufficient documentation

## 2016-06-09 DIAGNOSIS — A159 Respiratory tuberculosis unspecified: Secondary | ICD-10-CM | POA: Diagnosis not present

## 2016-06-09 DIAGNOSIS — M791 Myalgia, unspecified site: Secondary | ICD-10-CM

## 2016-06-09 DIAGNOSIS — E871 Hypo-osmolality and hyponatremia: Secondary | ICD-10-CM

## 2016-06-09 DIAGNOSIS — I10 Essential (primary) hypertension: Secondary | ICD-10-CM | POA: Insufficient documentation

## 2016-06-09 DIAGNOSIS — E43 Unspecified severe protein-calorie malnutrition: Secondary | ICD-10-CM | POA: Diagnosis not present

## 2016-06-09 DIAGNOSIS — E1165 Type 2 diabetes mellitus with hyperglycemia: Secondary | ICD-10-CM | POA: Diagnosis not present

## 2016-06-09 DIAGNOSIS — E1142 Type 2 diabetes mellitus with diabetic polyneuropathy: Secondary | ICD-10-CM | POA: Insufficient documentation

## 2016-06-09 DIAGNOSIS — Z794 Long term (current) use of insulin: Secondary | ICD-10-CM | POA: Diagnosis not present

## 2016-06-09 DIAGNOSIS — R739 Hyperglycemia, unspecified: Secondary | ICD-10-CM | POA: Diagnosis not present

## 2016-06-09 DIAGNOSIS — E2740 Unspecified adrenocortical insufficiency: Secondary | ICD-10-CM | POA: Insufficient documentation

## 2016-06-09 DIAGNOSIS — Z5189 Encounter for other specified aftercare: Secondary | ICD-10-CM | POA: Insufficient documentation

## 2016-06-09 LAB — GLUCOSE, POCT (MANUAL RESULT ENTRY): POC GLUCOSE: 323 mg/dL — AB (ref 70–99)

## 2016-06-09 LAB — POCT GLYCOSYLATED HEMOGLOBIN (HGB A1C): Hemoglobin A1C: 5.9

## 2016-06-09 MED ORDER — INSULIN ASPART 100 UNIT/ML ~~LOC~~ SOLN
10.0000 [IU] | Freq: Once | SUBCUTANEOUS | Status: AC
  Administered 2016-06-09: 10 [IU] via SUBCUTANEOUS

## 2016-06-09 MED ORDER — GABAPENTIN 300 MG PO CAPS: 600.0000 mg | ORAL_CAPSULE | Freq: Three times a day (TID) | ORAL | 3 refills | Status: DC

## 2016-06-09 NOTE — Progress Notes (Signed)
Olivia Williamson, is a 71 y.o. female  WUJ:[id number]  NFA:[id number]  DOB - 11/29/1945  Chief Complaint  Patient presents with  . Depression        Subjective:   Olivia Williamson is a 71 y.o. female here today for a follow up visit, last seen 12/30/15. 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  She is currently on rifampin 450mg  three times a week, Isoniazid 600mg  three times week and vit B6 50mg  po three times a week.  She is directly observed taking meds w/ health department.    Of note, I talked to the Health dept MD, Dr Neomia Glass, who has been following her closely, and concerned about her hyponatremia and concern adrenal insufficiency.  Pt c/o of diffuse althragias/pains, including in her abd/back/legs.   Pt here today w/ her son.  Pt speaks very little, mainly [place] w/ her son, who is here to interpret. Her son speaks [place]  Patient has No headache, No chest pain, No abdominal pain - No Nausea, No new weakness tingling or numbness, No Cough - SOB.  No problems updated.  ALLERGIES: Allergies  Allergen Reactions  . Beef-Derived Products Other (See Comments)    Generalized burning sensation  . Eggs Or Egg-Derived Products Other (See Comments)    Generalized burning sensation  . Fish Allergy Other (See Comments)    Generalized burning sensation  . Fruit & Vegetable Daily [Nutritional Supplements] Other (See Comments)    Orange causes lower extremity burning    PAST MEDICAL HISTORY: Past Medical History:  Diagnosis Date  . Arthritis   . Diabetes mellitus without complication (HCC)    stopped DM meds 2004  . GERD (gastroesophageal reflux disease)   . Hypertension    meds stopped 1 week ago , need to get renewed  . Tuberculosis    ACTIVE    MEDICATIONS AT HOME: Prior to Admission medications   Medication Sig Start Date End Date Taking? Authorizing Provider  Ferrous Gluconate 324 (37.5 Fe) MG TABS Take 1 tablet (324  mg total) by mouth daily. Patient not taking: Reported on 05/22/2016 12/30/15   Pete Glatter, MD  gabapentin (NEURONTIN) 300 MG capsule Take 2 capsules (600 mg total) by mouth 3 (three) times daily. 06/09/16   Pete Glatter, MD  HYDROcodone-acetaminophen (NORCO/VICODIN) 5-325 MG tablet Take 1 tablet by mouth every 6 (six) hours as needed for pain. 04/21/16   Historical Provider, MD  Insulin Glargine (LANTUS SOLOSTAR) 100 UNIT/ML Solostar Pen Inject 25 Units into the skin daily at 10 pm. 03/27/16   Anders Simmonds, PA-C  isoniazid (NYDRAZID) 300 MG tablet Take 600 mg by mouth 3 (three) times a week. MWF    Historical Provider, MD  promethazine (PHENERGAN) 25 MG tablet Take 1 tablet (25 mg total) by mouth every 8 (eight) hours as needed for nausea or vomiting. 05/22/16   Charlestine Night, PA-C  pyridOXINE (VITAMIN B-6) 50 MG tablet Take 50 mg by mouth every Monday, Wednesday, and Friday.     Historical Provider, MD  rifampin (RIFADIN) 150 MG capsule Take 150 mg by mouth 3 (three) times a week. Take along with 300 mg capsule=450 mg on MWF    Historical Provider, MD  rifampin (RIFADIN) 300 MG capsule Take 300 mg by mouth 3 (three) times a week. Take along with 150 mg capsule=450 mg on MWF    Historical Provider, MD  saccharomyces boulardii (FLORASTOR) 250 MG capsule  Take 1 capsule (250 mg total) by mouth 2 (two) times daily. Any probiotic that is covered Patient not taking: Reported on 05/22/2016 01/16/16   Leroy SeaPrashant K Singh, MD  traMADol (ULTRAM) 50 MG tablet Take 1 tablet (50 mg total) by mouth every 6 (six) hours as needed for severe pain. 05/22/16   Charlestine Nighthristopher Lawyer, PA-C     Objective:   Vitals:   06/09/16 1414  BP: 124/75  Pulse: 87  Resp: 16  Temp: 97.7 F (36.5 C)  TempSrc: Oral  SpO2: 98%  Weight: 81 lb 3.2 oz (36.8 kg)    Exam General appearance : Awake, alert, not in any distress. Speech Clear, but very slow and minimal. Not toxic looking, cachetic, thin appearing  female HEENT: Atraumatic and Normocephalic, pupils equally reactive to light. Neck: supple, no JVD. No cervical lymphadenopathy.  Chest:Good air entry bilaterally, no added sounds. CVS: S1 S2 regular, no murmurs/gallups or rubs. Abdomen: Bowel sounds active, Non tender and not distended with no gaurding, rigidity or rebound. Extremities: B/L Lower Ext shows no edema, both legs are warm to touch, diffuse muscle atrophy throughout. Neurology: Awake alert, and oriented X 3, CN II-XII grossly intact, Non focal Skin:No Rash  Data Review Lab Results  Component Value Date   HGBA1C 5.9 06/09/2016   HGBA1C 6.7 03/27/2016   HGBA1C 12.2 (H) 12/15/2015    Depression screen PHQ 2/9 06/09/2016 03/27/2016 12/30/2015 12/10/2014  Decreased Interest 1 0 0 0  Down, Depressed, Hopeless 0 0 1 0  PHQ - 2 Score 1 0 1 0  Altered sleeping - 3 - -  Tired, decreased energy - 1 - -  Change in appetite - 0 - -  Feeling bad or failure about yourself  - 0 - -  Trouble concentrating - 0 - -  Moving slowly or fidgety/restless - 0 - -  Suicidal thoughts - 0 - -  PHQ-9 Score - 4 - -      Assessment & Plan   1. Type 2 diabetes mellitus with diabetic polyneuropathy, with long-term current use of insulin (HCC), w/ hyperglycemia - labile sugars I suspect, brittle dm? Her a1c is down to 5.9, but cbgs >300s in clinic. - POCT glucose (manual entry) 323  - POCT glycosylated hemoglobin (Hb A1C) 5.9 (from 12.2) - Microalbumin/Creatinine Ratio, Urine - Ambulatory referral to Ophthalmology -  insulin aspart (novoLOG) injection 10 Units; Inject 0.1 mLs (10 Units total) into the skin once. - will ask her to f/u w/ [name] 2 wks. - labs pending in am.  2. Essential hypertension - well controlled  3. Protein-calorie malnutrition, severe W/ cachetia, 1 case of protein shakes provided today, but pt to come back to pick up.  4. Myalgia Etiology unclear. - Uric Acid; Future  5. Hyponatremia, ?adrenal insufficiency H/o,  repeat last pending, want to do cortisol and acth as well, but these are am labs-  - pt scheduled am lab appt Friday am. Apparently lab tech tried to call them to come in earlier, but unable to get anyone to answer phn - Sodium, urine, random - Osmolality, urine  6. Active TB - being treated w/ Health Dept.   Patient have been counseled extensively about nutrition and exercise  Return in about 3 months (around 09/07/2016), or if symptoms worsen or fail to improve.  The patient was given clear instructions to go to ER or return to medical center if symptoms don't improve, worsen or new problems develop. The patient verbalized understanding. The patient was  told to call to get lab results if they haven't heard anything in the next week.   This note has been created with Education officer, environmental. Any transcriptional errors are unintentional.   Pete Glatter, MD, MBA/MHA [facility] [place], [place] [phone]   06/09/2016, 4:09 PM

## 2016-06-09 NOTE — Telephone Encounter (Signed)
Opened in error

## 2016-06-09 NOTE — Patient Instructions (Addendum)
- Lab visit tomorrow 830 am  - for lab draw   B?nh ti?u ???ng v th?c ph?m (Diabetes Mellitus and Food) ?i?u quan tro?ng la? quy? vi? pha?i ki?m sot l??ng ???ng (glucose) trong ma?u. L???ng ???ng trong mu c?a quy? vi? c th? b? ?nh h??ng r?t nhi?u b?i nh?ng g quy? vi? ?n. Vi?c ?n u?ng ca?c th?c ph?m la?nh ma?nh v??i s? l???ng thch h?p trong ngy va?o cng th?i gian m?i ngy s? gip quy? vi? ki?m sot l???ng glucose trong mu c?a quy? vi?. N c?ng c th? gip lm ch?m ho?c ng?n khng cho b?nh ti?u ???ng n??ng thm. ?n u?ng lnh m?nh th?m ch c th? gip quy? vi? c?i thi?n huy?t p c?a quy? vi? v ??t ???c ho?c duy tr m?t m??c cn n??ng kh?e m?nh. Cc khuy?n ngh? chung cho ?n u?ng v thi quen n?u ?n lnh m?nh bao g?m:  ?n cc b?a ?n v ?? ?n nh? th??ng xuyn. Trnh nhi?n ?n trong th?i gian di ?? gi?m cn.  ?n m?t ch? ?? ?n bao g?m ch? y?u l th??c ph?m co? ngu?n g?c th?c v?t, ch?ng h?n nh? tri cy, rau, h?t, rau ??u v ng? c?c nguyn ha?t.  S? d?ng ca?c ph??ng php n?u ?n nhi?t ?? th?p, ch?ng h?n nh? n??ng, thay v ph??ng php n?u nhi?t ?? cao nh? chin ng?p d?u m??. Lm vi?c v?i cc chuyn gia dinh d??ng c?a quy? vi? ?? ba?o ?a?m quy? vi? hi?u ca?ch s? d?ng thng tin dinh d??ng trn nhn th?c ph?m. TH?C PH?M CO? TH? ?NH H??NG ??N TI NH? TH? NA?O? Carbohydrates  Carbohydrate ?nh h??ng ??n m?c ?? ???ng trong mu c?a quy? vi? nhi?u h?n b?t k? lo?i th?c ph?m no khc. Chuyn gia dinh d??ng c?a quy? vi? s? gip quy? vi? xc ??nh co? th? ?n bao nhiu carbohydrates m?i b?a ?n v h???ng d?n quy? vi? ca?ch ti?nh l???ng carbohydrates. Ti?nh l???ng carbohydrates l vi?c quan tr?ng ?? gi? cho l???ng ???ng trong ma?u cu?a quy? vi? ? m?c phu? h??p, ??c bi?t n?u quy? vi? ?ang du?ng insulin ho?c m?t s? thu?c tri? ti?u ???ng nh?t ?i?nh. R??u  R??u c th? gy gi?m ??t ng?t l???ng ???ng trong mu (h? ???ng huy?t), ??c bi?t n?u quy? vi? du?ng insulin ho?c m?t s? lo?i thu?c tri? ti?u ???ng  nh?t ?i?nh. H? ???ng huy?t c th? l m?t tnh tr?ng ?e d?a ma?ng s?ng. Cc tri?u ch?ng c?a h? ???ng huy?t (bu?n ng?, chng m?t v m?t ph??ng h??ng) l t??ng t? nh? cc tri?u ch?ng c?a vi?c u?ng qu nhi?u r??u. N?u chuyn gia ch?m [place] s?c kh?e c?a quy? vi? ch?p thu?n cho quy? vi? u?ng r??u, ha?y u?ng v??a pha?i va? la?m theo cc h???ng d?n sau:  Ph? n? khng nn u?ng nhi?u h?n m?t ly m?i ngy, v ?n ng khng nn u?ng nhi?u h?n hai ly m?i ngy. M?t ly l t??ng ???ng v?i:  12 oz bia.  5 oz r??u vang.  1 oz r??u n??ng.  Khng u?ng r???u khi ?o?i.  Gi? cho c? th? ?u? n???c. U?ng n??c, soda cho ng???i ?n king, ho?c tr ?a? khng thm ???ng.  Neita Carp thng th???ng, n??c tri cy v ca?c ?? u?ng h?n h??p khc c th? ch?a nhi?u carbohydrate v c?n ???c tnh. NH?NG TH?C ?N NO KHNG ???C KHUY?N NGH?? Khi quy? vi? l?a ch?n th?c ph?m, ?i?u quan tr?ng l ph?i nh? r?ng t?t c? cc lo?i th?c ph?m khng gi?ng nhau. M?t s? lo?i th?c ph?m c i?t ch?t  dinh d??ng trong m?t kh?u ph?n h?n so v??i cc th?c ph?m khc, m?c d chu?ng c th? c cng m?t s? l???ng calo ho?c carbohydrate. Kho? c th? ?? c? th? quy? vi? co? ?u? nh?ng ch?t c?n thi?t khi quy? vi? ?n cc th?c ph?m t ch?t dinh d??ng. V d? v? cc lo?i th?c ph?m m quy? vi? nn trnh ma? co? nhi?u calo v carbohydrate nh?ng t ch?t dinh d??ng bao g?m:  Ch?t be?o ba?o ho?a (h?u h?t ca?c th?c ph?m ch? bi?n ??u li?t k ch?t be?o ba?o ho?a trn nhn thng tin dinh d??ng).  Soda thng th???ng.  N??c p.  K?o.  ?? ng?t, ch?ng h?n nh? bnh, bnh n???ng, bnh rn v ba?nh quy.  Th?c ph?m chin. TI C TH? ?N TH?C ?N G? ?n th?c ph?m giu ch?t dinh d??ng, m s? nui d??ng c? th? quy? vi? v gi? cho quy? vi? kh?e m?nh. Th?c ph?m quy? vi? nn ?n c?ng s? ph? thu?c vo m?t s? y?u t?, bao g?m:  L??ng calo quy? vi? c?n.  Cc lo?i thu?c m quy? vi? du?ng.  Cn n??ng cu?a quy? vi?.  M?c ?? ???ng trong mu c?a quy? vi?.  Huy?t p  c?a quy? vi?.  M?c ?? cholesterol c?a quy? vi?. Quy? vi? nn ?n nhi?u lo?i th?c ph?m kha?c nhau, bao g?m:  Protein.  Thi?t na?c.  Protein co? i?t ch?t bo bo ha, nh? c, lng tr?ng tr?ng v ??u. Trnh cc lo?i th?t ch? bi?n s??n.  Tri cy v rau.  Tri cy v rau qu? c th? gip ki?m sot m?c ?? ???ng trong mu, nh? to, xoi v [place] m??.  Ca?c sa?n ph?m s??a.  Ch?n cc s?n ph?m s??a khng co? ho??c co? i?t ch?t be?o, ch?ng h?n nh? s?a, s??a chua va? pho mt.  Cc lo?i ng? c?c, bnh m, m ?ng v g?o.  Ch?n s?n ph?m nguyn h?t, ch?ng h?n nh? bnh m la?m t?? nhi?u loa?i ha?t, y?n m?ch nguyn ha?t v g?o l??t. Nh?ng th?c ph?m na?y c th? gip ki?m sot huy?t p.  Ch?t bo.  Th?c ph?m ch?a ch?t bo lnh m?nh, ch?ng h?n nh? cc lo?i h?t, l t?u, d?u  liu, d?u [place], v c. CO? PHA?I M?I NG??I BI? B?NH TI?U ???NG ??U C K? HO?CH ?N U?NG NH? NHAU KHNG? B?i v m?i ng??i bi? b?nh ti?u ???ng l khc nhau, nn khng m?t k? ho?ch ?n u?ng ph h?p cho t?t c? m?i ng??i. ?i?u r?t quan tr?ng la? quy? vi? pha?i g??p chuyn gia dinh d??ng, chuyn gia ? s? gip quy? vi? xy d??ng m?t k? ho?ch ?n u?ng ch? phu? h??p cho quy? vi?. Thng tin ny khng nh?m m?c ?ch thay th? cho l?i khuyn m chuyn gia ch?m [place] s?c kh?e ni v?i qu v?. Hy b?o ??m qu v? ph?i th?o lu?n b?t k? v?n ?? g m qu v? c v?i chuyn gia ch?m [place] s?c kh?e c?a qu v?. Document Released: 09/02/2015 Document Revised: 09/02/2015 Document Reviewed: 04/07/2013 Elsevier Interactive Patient Education  2017 [employer]

## 2016-06-10 LAB — SODIUM, URINE, RANDOM: SODIUM UR: 33 mmol/L (ref 28–272)

## 2016-06-10 LAB — MICROALBUMIN / CREATININE URINE RATIO
Creatinine, Urine: 121 mg/dL (ref 20–320)
Microalb Creat Ratio: 70 mcg/mg creat — ABNORMAL HIGH (ref <30)
Microalb, Ur: 8.5 mg/dL

## 2016-06-10 LAB — OSMOLALITY, URINE: OSMOLALITY UR: 514 mosm/kg (ref 50–1200)

## 2016-06-12 ENCOUNTER — Other Ambulatory Visit: Payer: Self-pay

## 2016-06-19 ENCOUNTER — Ambulatory Visit: Payer: Medicare Other | Attending: Internal Medicine

## 2016-06-19 DIAGNOSIS — M791 Myalgia, unspecified site: Secondary | ICD-10-CM

## 2016-06-19 DIAGNOSIS — E871 Hypo-osmolality and hyponatremia: Secondary | ICD-10-CM | POA: Diagnosis present

## 2016-06-19 LAB — CBC
HCT: 30.5 % — ABNORMAL LOW (ref 35.0–45.0)
Hemoglobin: 9.5 g/dL — ABNORMAL LOW (ref 11.7–15.5)
MCH: 22.5 pg — ABNORMAL LOW (ref 27.0–33.0)
MCHC: 31.1 g/dL — AB (ref 32.0–36.0)
MCV: 72.1 fL — ABNORMAL LOW (ref 80.0–100.0)
PLATELETS: 105 10*3/uL — AB (ref 140–400)
RBC: 4.23 MIL/uL (ref 3.80–5.10)
RDW: 18.5 % — AB (ref 11.0–15.0)
WBC: 4.9 10*3/uL (ref 3.8–10.8)

## 2016-06-19 NOTE — Progress Notes (Signed)
Patient here for lab visit  

## 2016-06-20 LAB — CMP AND LIVER
ALBUMIN: 3.2 g/dL — AB (ref 3.6–5.1)
ALK PHOS: 97 U/L (ref 33–130)
ALT: 16 U/L (ref 6–29)
AST: 18 U/L (ref 10–35)
BILIRUBIN TOTAL: 0.6 mg/dL (ref 0.2–1.2)
BUN: 30 mg/dL — AB (ref 7–25)
Bilirubin, Direct: 0.2 mg/dL (ref <=0.2)
CALCIUM: 9 mg/dL (ref 8.6–10.4)
CO2: 28 mmol/L (ref 20–31)
CREATININE: 0.66 mg/dL (ref 0.50–0.99)
Chloride: 98 mmol/L (ref 98–110)
GLUCOSE: 108 mg/dL — AB (ref 65–99)
Indirect Bilirubin: 0.4 mg/dL (ref 0.2–1.2)
Potassium: 4.4 mmol/L (ref 3.5–5.3)
SODIUM: 135 mmol/L (ref 135–146)
TOTAL PROTEIN: 6.2 g/dL (ref 6.1–8.1)

## 2016-06-20 LAB — URIC ACID: URIC ACID, SERUM: 2.7 mg/dL (ref 2.5–7.0)

## 2016-06-20 LAB — CORTISOL: Cortisol, Plasma: 16.1 ug/dL

## 2016-06-22 ENCOUNTER — Telehealth: Payer: Self-pay | Admitting: Internal Medicine

## 2016-06-22 LAB — ACTH: C206 ACTH: 60 pg/mL — AB (ref 6–50)

## 2016-06-22 NOTE — Telephone Encounter (Signed)
Called and dw Dr Gordy CouncilmanBachman, Health dept MD,  about pt's labs.  is [phone] (cell).  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  Called pt as well, confirmed dob. Attempted to call pt at home [phone], left vm on mobile [phone], left vm to call us re: results.

## 2016-07-10 ENCOUNTER — Other Ambulatory Visit: Payer: Self-pay | Admitting: Internal Medicine

## 2016-07-13 ENCOUNTER — Ambulatory Visit: Payer: Self-pay | Admitting: Nurse Practitioner

## 2016-07-17 ENCOUNTER — Other Ambulatory Visit: Payer: Self-pay | Admitting: Internal Medicine

## 2016-07-20 ENCOUNTER — Telehealth: Payer: Self-pay | Admitting: Internal Medicine

## 2016-07-20 MED ORDER — GABAPENTIN 300 MG PO CAPS: 600.0000 mg | ORAL_CAPSULE | Freq: Three times a day (TID) | ORAL | 3 refills | Status: DC

## 2016-07-20 NOTE — Telephone Encounter (Signed)
Rx for [facility]

## 2016-07-20 NOTE — Telephone Encounter (Signed)
[name] case manager from the health department called stating that pt. Needs a refill on Gabapentin. Case Worker was told that the medication was prescribed in January with three refills and [facility]. Case Worker stated that the pt. [facility] [address]  Please f/u

## 2016-08-13 ENCOUNTER — Other Ambulatory Visit: Payer: Self-pay | Admitting: Infectious Disease

## 2016-08-13 ENCOUNTER — Ambulatory Visit
Admission: RE | Admit: 2016-08-13 | Discharge: 2016-08-13 | Disposition: A | Discharge status: Home / Self Care | Payer: No Typology Code available for payment source | Source: Ambulatory Visit | Attending: Infectious Disease | Admitting: Infectious Disease

## 2016-08-13 DIAGNOSIS — A159 Respiratory tuberculosis unspecified: Secondary | ICD-10-CM

## 2016-09-21 ENCOUNTER — Telehealth: Payer: Self-pay | Admitting: Internal Medicine

## 2016-09-21 ENCOUNTER — Other Ambulatory Visit: Payer: Self-pay | Admitting: Internal Medicine

## 2016-09-21 MED ORDER — INSULIN GLARGINE 100 UNIT/ML SOLOSTAR PEN: 25.0000 [IU] | PEN_INJECTOR | Freq: Every day | SUBCUTANEOUS | 0 refills | Status: DC

## 2016-09-21 NOTE — Progress Notes (Unsigned)
-   I renewed lantus x 1 month, looks like she has appt w/ [place] [facility] primary coming up 10/09/16. thanks

## 2016-09-21 NOTE — Telephone Encounter (Signed)
Pt calling to request a refill on insulin and needles. Please f/u.

## 2016-09-23 ENCOUNTER — Telehealth: Payer: Self-pay | Admitting: Internal Medicine

## 2016-09-23 MED ORDER — INSULIN PEN NEEDLE 31G X 6 MM MISC: 2 refills | Status: DC

## 2016-09-23 NOTE — Telephone Encounter (Signed)
Pen needles refilled 

## 2016-09-23 NOTE — Telephone Encounter (Signed)
PT husband called to request a refill for gabapentin (NEURONTIN) 300 MG capsule  Please follow up with PT

## 2016-09-23 NOTE — Telephone Encounter (Signed)
PT husband called to request the needle for the insulim can you please sent [facility], please follow up

## 2016-09-24 ENCOUNTER — Other Ambulatory Visit: Payer: Self-pay | Admitting: Internal Medicine

## 2016-09-29 NOTE — Telephone Encounter (Signed)
Looks like she has appt w/ [place] pcp  on 10/09/16

## 2016-10-08 ENCOUNTER — Encounter: Payer: Self-pay | Admitting: Internal Medicine

## 2016-10-09 ENCOUNTER — Ambulatory Visit: Payer: Self-pay | Admitting: Nurse Practitioner

## 2016-10-09 ENCOUNTER — Encounter: Payer: Self-pay | Admitting: Internal Medicine

## 2016-10-12 ENCOUNTER — Encounter: Payer: Self-pay | Admitting: Internal Medicine

## 2016-10-12 ENCOUNTER — Telehealth: Payer: Self-pay | Admitting: Internal Medicine

## 2016-10-12 NOTE — Telephone Encounter (Signed)
Patient called the office to request medication refill for gabapentin (NEURONTIN) 300 MG capsule. [facility] [address]   Thank you.

## 2016-10-13 MED ORDER — GABAPENTIN 300 MG PO CAPS: 300.0000 mg | ORAL_CAPSULE | Freq: Three times a day (TID) | ORAL | 0 refills | Status: DC

## 2016-10-13 NOTE — Telephone Encounter (Signed)
Amy could you schedule her an appointment with Dr. Laural BenesJohnson but first see who she is seeing for her pcp if she is going to  she will need to make an appointment with them

## 2016-10-13 NOTE — Telephone Encounter (Signed)
Patient's family member called to check on the status of medication. Informed them of what PCP said. He confirmed that [facility] will be coming [facility]. Schedule appt with Dr. Laural BenesJohnson has been made.

## 2016-10-13 NOTE — Telephone Encounter (Signed)
I refilled her neurotin, but much lower dose of 300tid. Needs fu appt for further refills or dose adjustments. We have not seen her since Jan, and she no-show to [facility]. Please fu to see who she is seeing in future for PCP care.

## 2016-10-13 NOTE — Telephone Encounter (Signed)
Amy could you schedule her an appointment with Dr. Johnson but first see who she is seeing for her pcp if she is going to [place] she will need to make an appointment with them  

## 2016-10-27 ENCOUNTER — Encounter: Payer: Self-pay | Admitting: Internal Medicine

## 2016-10-27 ENCOUNTER — Ambulatory Visit: Payer: Medicaid Other | Attending: Internal Medicine | Admitting: Internal Medicine

## 2016-10-27 VITALS — BP 130/70 | HR 73 | Temp 98.2°F | Resp 16 | Wt 90.0 lb

## 2016-10-27 DIAGNOSIS — I951 Orthostatic hypotension: Secondary | ICD-10-CM | POA: Diagnosis not present

## 2016-10-27 DIAGNOSIS — Z79899 Other long term (current) drug therapy: Secondary | ICD-10-CM | POA: Insufficient documentation

## 2016-10-27 DIAGNOSIS — Z90710 Acquired absence of both cervix and uterus: Secondary | ICD-10-CM | POA: Diagnosis not present

## 2016-10-27 DIAGNOSIS — E11649 Type 2 diabetes mellitus with hypoglycemia without coma: Secondary | ICD-10-CM | POA: Diagnosis not present

## 2016-10-27 DIAGNOSIS — I10 Essential (primary) hypertension: Secondary | ICD-10-CM | POA: Diagnosis not present

## 2016-10-27 DIAGNOSIS — D61818 Other pancytopenia: Secondary | ICD-10-CM | POA: Diagnosis not present

## 2016-10-27 DIAGNOSIS — Z91012 Allergy to eggs: Secondary | ICD-10-CM | POA: Diagnosis not present

## 2016-10-27 DIAGNOSIS — E43 Unspecified severe protein-calorie malnutrition: Secondary | ICD-10-CM

## 2016-10-27 DIAGNOSIS — Z9049 Acquired absence of other specified parts of digestive tract: Secondary | ICD-10-CM | POA: Diagnosis not present

## 2016-10-27 DIAGNOSIS — E1142 Type 2 diabetes mellitus with diabetic polyneuropathy: Secondary | ICD-10-CM

## 2016-10-27 DIAGNOSIS — Z91018 Allergy to other foods: Secondary | ICD-10-CM | POA: Insufficient documentation

## 2016-10-27 DIAGNOSIS — Z794 Long term (current) use of insulin: Secondary | ICD-10-CM

## 2016-10-27 DIAGNOSIS — D649 Anemia, unspecified: Secondary | ICD-10-CM | POA: Diagnosis not present

## 2016-10-27 DIAGNOSIS — K009 Disorder of tooth development, unspecified: Secondary | ICD-10-CM

## 2016-10-27 DIAGNOSIS — E041 Nontoxic single thyroid nodule: Secondary | ICD-10-CM

## 2016-10-27 DIAGNOSIS — Z91013 Allergy to seafood: Secondary | ICD-10-CM | POA: Diagnosis not present

## 2016-10-27 DIAGNOSIS — K219 Gastro-esophageal reflux disease without esophagitis: Secondary | ICD-10-CM | POA: Diagnosis not present

## 2016-10-27 LAB — POCT GLYCOSYLATED HEMOGLOBIN (HGB A1C): HEMOGLOBIN A1C: 6.4

## 2016-10-27 LAB — GLUCOSE, POCT (MANUAL RESULT ENTRY): POC GLUCOSE: 189 mg/dL — AB (ref 70–99)

## 2016-10-27 MED ORDER — INSULIN GLARGINE 100 UNIT/ML SOLOSTAR PEN: 22.0000 [IU] | PEN_INJECTOR | Freq: Every day | SUBCUTANEOUS | 0 refills | Status: DC

## 2016-10-27 MED ORDER — GABAPENTIN 300 MG PO CAPS: 600.0000 mg | ORAL_CAPSULE | Freq: Two times a day (BID) | ORAL | 6 refills | Status: DC

## 2016-10-27 MED ORDER — FERROUS SULFATE 325 (65 FE) MG PO TABS: 325.0000 mg | ORAL_TABLET | Freq: Every day | ORAL | 1 refills | Status: DC

## 2016-10-27 NOTE — Progress Notes (Addendum)
Patient ID: Olivia Williamson, female    DOB: 1946/01/17  MRN: [id number]  CC: Establish Care; Hypertension; and Diabetes   Subjective: Olivia Williamson is a 71 y.o. female who presents for chronic ds management.  Her son and an interpreter from [employer], are with her.  Her concerns today include:  1. TB:  She has completed treated April 2018 through health Department.  2. Malnutrition:  Gained 9 lb since last visit.  Appetite a little better -does not chew well.  Teeth hurts when she eats hard foods. request dental referral  3.  DM: -checks BS every morning.  BS yesterday morning was 65 and 94 this a.m.  Felt dizzy with low BS yesterday.  Had to eat something. -usually BS above 100 but past 3 days a.m sugars were lower. -Saw eye doctor today.  No DM retinopathy per son -on Lantus 25 units bedtime  4.  C/o pain in legs all the time especially at nights.  -numbness and sharp pains from ankles down -out of Gabapentin x 2 wks which is very helpful for her.  She reports taking 600 mg BID.  Previous dose 600 3 times a day. Dr. Illene Labrador decreased to 300 mg twice a day on recent refill until patient can be seen today  -Denies any increase in daytime drowsiness with gabapentin.  5.  Thyroid nodule:  Thyroid ultrasound ordered in the fall of last year. Patient never had it done -Nodule present for years. Has decreased in size per patient -No compressive symptoms at this time  HM: declines MMG.  Would like to have bowels eval with colonoscopy but son wants to hold off for several mths due pt still being a bit weak.  Pt denies any blood in stools. + constipation. -History of anemia. she has been out of iron for a while  Patient Active Problem List   Diagnosis Date Noted  . Orthostatic hypotension 05/11/2016  . Thyroid nodule 04/13/2016  . Postmenopausal status 04/13/2016  . Chest pain   . Abnormal CT of the chest   . Tuberculosis 01/09/2016  . Protein-calorie malnutrition, severe 12/16/2015  .  Generalized weakness 12/15/2015  . Dehydration with hyponatremia 12/15/2015  . Pancytopenia (HCC) 12/15/2015  . Hyperglycemia 12/15/2015  . Nausea without vomiting 12/15/2015  . Hypertension   . GERD (gastroesophageal reflux disease)   . Diabetes mellitus with neurological manifestations [facility])      Current Outpatient Prescriptions on File Prior to Visit  Medication Sig Dispense Refill  . BD PEN NEEDLE NANO U/F 32G X 4 MM MISC USE TO INJECT INSULIN 100 each 2  . Insulin Pen Needle (ULTICARE MINI PEN NEEDLES) 31G X 6 MM MISC Use as directed 100 each 2  . promethazine (PHENERGAN) 25 MG tablet Take 1 tablet (25 mg total) by mouth every 8 (eight) hours as needed for nausea or vomiting. 15 tablet 0  . saccharomyces boulardii (FLORASTOR) 250 MG capsule Take 1 capsule (250 mg total) by mouth 2 (two) times daily. Any probiotic that is covered (Patient not taking: Reported on 05/22/2016) 60 capsule 2   No current facility-administered medications on file prior to visit.     Allergies  Allergen Reactions  . Beef-Derived Products Other (See Comments)    Generalized burning sensation  . Eggs Or Egg-Derived Products Other (See Comments)    Generalized burning sensation  . Fish Allergy Other (See Comments)    Generalized burning sensation  . Fruit & Vegetable Daily [Nutritional Supplements] Other (See  Comments)    Orange causes lower extremity burning    Social History   Social History  . Marital status: Widowed    Spouse name: N/A  . Number of children: N/A  . Years of education: N/A   Occupational History  . Not on file.   Social History Main Topics  . Smoking status: Never Smoker  . Smokeless tobacco: Never Used  . Alcohol use No  . Drug use: No  . Sexual activity: Not on file   Other Topics Concern  . Not on file   Social History Narrative  . No narrative on file    Family History  Problem Relation Age of Onset  . Heart disease Maternal Uncle [id number]    Past Surgical  History:  Procedure Laterality Date  . ABDOMINAL HYSTERECTOMY    . CHOLECYSTECTOMY N/A 05/02/2014   Procedure: LAPAROSCOPIC CHOLECYSTECTOMY ;  Surgeon: Abigail Miyamotoouglas Blackman, MD;  [facility];  Service: General;  Laterality: N/A;  laparoscopic cholecystectomy  . COLONOSCOPY    . NO PAST SURGERIES    . VIDEO BRONCHOSCOPY Bilateral 01/10/2016   Procedure: VIDEO BRONCHOSCOPY WITHOUT FLUORO;  Surgeon: Leslye Peerobert S Byrum, MD;  [facility];  Service: Cardiopulmonary;  Laterality: Bilateral;    ROS: Review of Systems as stated above   PHYSICAL EXAM: BP 130/70   Pulse 73   Temp 98.2 F (36.8 C) (Oral)   Resp 16   Wt 90 lb (40.8 kg)   LMP 05/11/2016   SpO2 97%   BMI 17.58 kg/m   Wt Readings from Last 3 Encounters:  10/27/16 90 lb (40.8 kg)  06/09/16 81 lb 3.2 oz (36.8 kg)  05/15/16 83 lb 1.6 oz (37.7 kg)   Physical Exam  General appearance -pleasant elderly female in NAD. Mental status - oriented to person and place. Answers questions appropriately. Eyes -pale conjunctiva  Mouth -moist mucosa. Teeth are malaligned and some are missing Neck - palpable 3 cm thyroid nodule on the left side, no cervical lymphadenopathy Chest - clear to auscultation, no wheezes, rales or rhonchi, symmetric air entry Heart - normal rate, regular rhythm, normal S1, S2, no murmurs, rubs, clicks or gallops Abdomen - soft, nontender, nondistended, no masses or organomegaly Extremities - peripheral pulses normal, no pedal edema, no clubbing or cyanosis Diabetic Foot Exam - Simple   Simple Foot Form Visual Inspection No deformities, no ulcerations, no other skin breakdown bilaterally:  Yes Sensation Testing See comments:  Yes Pulse Check Posterior Tibialis and Dorsalis pulse intact bilaterally:  Yes Comments LEAP abnormal with decrease sensation on plantar surface LT>RT.     Results for orders placed or performed in visit on 10/27/16  POCT glucose (manual entry)  Result Value Ref Range   POC  Glucose 189 (A) 70 - 99 mg/dl  POCT glycosylated hemoglobin (Hb A1C)  Result Value Ref Range   Hemoglobin A1C 6.4    A1C 6.4/ BS 189  ASSESSMENT AND PLAN: 1. Type 2 diabetes mellitus with diabetic polyneuropathy, with long-term current use of insulin (HCC) -Given recent hypoglycemia, recommend reduce Lantus from 25 units to 22 units daily - POCT glucose (manual entry) - POCT glycosylated hemoglobin (Hb A1C) - gabapentin (NEURONTIN) 300 MG capsule; Take 2 capsules (600 mg total) by mouth 2 (two) times daily.  Dispense: 120 capsule; Refill: 6 - CBC  2. Hypoglycemia associated with diabetes (HCC) -See above  3. Essential hypertension At goal off of medications  4. Thyroid nodule -Thyroid ultrasound - TSH - T4, free  5. Protein-calorie malnutrition, severe -Weight has improved We'll refer to dentist to get teeth fixed  6. Anemia, unspecified type -Recheck CBC today refill iron -Discussed having colonoscopy done for colon cancer screening. Son wishes to hold off on this for a few months until he feels patient's strength has improved sufficiently - ferrous sulfate (FERROUSUL) 325 (65 FE) MG tablet; Take 1 tablet (325 mg total) by mouth daily with breakfast.  Dispense: 100 tablet; Refill: 1  7. Dental anomaly - Ambulatory referral to Dentistry - Insulin Glargine (LANTUS SOLOSTAR) 100 UNIT/ML Solostar Pen; Inject 22 Units into the skin daily at 10 pm.  Dispense: 5 pen; Refill: 0  HM:  Patient declines having mammograms done Patient was given the opportunity to ask questions.  Patient and son verbalized understanding of the plan and were able to repeat key elements of the plan.   ADDENDUM 10/28/2016:  CBC reviewed.  Pt has hx of pancytopenia of unclear etiology (?TB or meds used to treat)  noted from last yr.  WBC now normal but still microcytic anemia with very low MCV and low PLT (PLT count has improved).  Will add Hbg electrophoresis (ICD code D64.9).  Will discuss referral to  hematology and GI for CA screening on next visit if numbeers do not improve.  Rx for iron given on visit yesterday.  Orders Placed This Encounter  Procedures  . US THYROID  . CBC  . TSH  . T4, free  . Ambulatory referral to Dentistry  . POCT glucose (manual entry)  . POCT glycosylated hemoglobin (Hb A1C)     Requested Prescriptions   Signed Prescriptions Disp Refills  . gabapentin (NEURONTIN) 300 MG capsule 120 capsule 6    Sig: Take 2 capsules (600 mg total) by mouth 2 (two) times daily.  . Insulin Glargine (LANTUS SOLOSTAR) 100 UNIT/ML Solostar Pen 5 pen 0    Sig: Inject 22 Units into the skin daily at 10 pm.  . ferrous sulfate (FERROUSUL) 325 (65 FE) MG tablet 100 tablet 1    Sig: Take 1 tablet (325 mg total) by mouth daily with breakfast.    Return in about 3 months (around 01/27/2017).  Jonah Blue, MD, FACP

## 2016-10-27 NOTE — Patient Instructions (Signed)
Decrease Lantus to 22 units daily.  Continue to monitor blood sugars.  If morning blood sugars continue to run lower than 90, please call for further instructions.   I have refilled the Gabapentin and Iron.

## 2016-10-28 LAB — CBC
HEMATOCRIT: 29.7 % — AB (ref 34.0–46.6)
HEMOGLOBIN: 9.4 g/dL — AB (ref 11.1–15.9)
MCH: 21.4 pg — ABNORMAL LOW (ref 26.6–33.0)
MCHC: 31.6 g/dL (ref 31.5–35.7)
MCV: 68 fL — ABNORMAL LOW (ref 79–97)
Platelets: 118 10*3/uL — ABNORMAL LOW (ref 150–379)
RBC: 4.4 x10E6/uL (ref 3.77–5.28)
RDW: 16.9 % — ABNORMAL HIGH (ref 12.3–15.4)
WBC: 3.9 10*3/uL (ref 3.4–10.8)

## 2016-10-28 LAB — TSH: TSH: 1.14 u[IU]/mL (ref 0.450–4.500)

## 2016-10-28 LAB — T4, FREE: Free T4: 1.45 ng/dL (ref 0.82–1.77)

## 2016-10-28 NOTE — Addendum Note (Signed)
Addended by: Jonah BlueJOHNSON, Braley Luckenbaugh B on: 10/28/2016 11:14 AM   Modules accepted: Orders

## 2016-10-29 LAB — SPECIMEN STATUS REPORT

## 2016-10-30 ENCOUNTER — Ambulatory Visit (HOSPITAL_COMMUNITY)
Admission: RE | Admit: 2016-10-30 | Discharge: 2016-10-30 | Disposition: A | Discharge status: Home / Self Care | Payer: Medicaid Other | Source: Ambulatory Visit | Attending: Internal Medicine | Admitting: Internal Medicine

## 2016-10-30 DIAGNOSIS — E042 Nontoxic multinodular goiter: Secondary | ICD-10-CM | POA: Diagnosis not present

## 2016-10-30 DIAGNOSIS — E43 Unspecified severe protein-calorie malnutrition: Secondary | ICD-10-CM | POA: Insufficient documentation

## 2016-10-30 DIAGNOSIS — E041 Nontoxic single thyroid nodule: Secondary | ICD-10-CM | POA: Diagnosis present

## 2016-11-01 ENCOUNTER — Encounter: Payer: Self-pay | Admitting: Internal Medicine

## 2016-11-01 DIAGNOSIS — D582 Other hemoglobinopathies: Secondary | ICD-10-CM

## 2016-11-01 DIAGNOSIS — E041 Nontoxic single thyroid nodule: Secondary | ICD-10-CM

## 2016-11-01 DIAGNOSIS — E042 Nontoxic multinodular goiter: Secondary | ICD-10-CM | POA: Insufficient documentation

## 2016-11-01 NOTE — Progress Notes (Signed)
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  No treatment needed at this time.  She has small masses in her thyroid gland.  Some of them may need to be biopsied.  I will refer her to an endocrinologist for further evaluation.  Results for orders placed or performed in visit on 10/27/16  CBC  Result Value Ref Range   WBC 3.9 3.4 - 10.8 x10E3/uL   RBC 4.40 3.77 - 5.28 x10E6/uL   Hemoglobin 9.4 (L) 11.1 - 15.9 g/dL   Hematocrit 16.129.7 (L) 09.634.0 - 46.6 %   MCV 68 (L) 79 - 97 fL   MCH 21.4 (L) 26.6 - 33.0 pg   MCHC 31.6 31.5 - 35.7 g/dL   RDW 04.516.9 (H) 40.912.3 - 81.115.4 %   Platelets 118 (L) 150 - 379 x10E3/uL  TSH  Result Value Ref Range   TSH 1.140 0.450 - 4.500 uIU/mL  T4, free  Result Value Ref Range   Free T4 1.45 0.82 - 1.77 ng/dL  Specimen status report  Result Value Ref Range   specimen status report Comment   Hemoglobinopathy evaluation  Result Value Ref Range   Hemoglobin F Quantitation 5.5 (H) 0.0 - 2.0 %   Hgb A 0.0 (L) 96.4 - 98.8 %   HGB S 0.0 0.0 %   HGB C 0.0 0.0 %   Hemoglobin A2 Quantitation 4.8 (H) 1.8 - 3.2 %   HGB VARIANT Comment: 0.0 %   HGB INTERPRETATION Comment   Specimen status report  Result Value Ref Range   specimen status report Comment   POCT glucose (manual entry)  Result Value Ref Range   POC Glucose 189 (A) 70 - 99 mg/dl  POCT glycosylated hemoglobin (Hb A1C)  Result Value Ref Range   Hemoglobin A1C 6.4

## 2016-11-02 ENCOUNTER — Telehealth: Payer: Self-pay

## 2016-11-02 NOTE — Telephone Encounter (Signed)
Contacted pt son to go over lab results no answer lvm asking pt son to give me a call at their earliest convenience .  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 No treatment needed at this time. She has small masses in her thyroid gland. Some of them may need to be biopsied. I will refer her to an endocrinologist for further evaluation.

## 2016-11-03 LAB — SPECIMEN STATUS REPORT

## 2016-11-03 LAB — HEMOGLOBINOPATHY EVALUATION
HGB C: 0 %
HGB S: 0 %
Hemoglobin A2 Quantitation: 4.8 % — ABNORMAL HIGH (ref 1.8–3.2)
Hemoglobin F Quantitation: 5.5 % — ABNORMAL HIGH (ref 0.0–2.0)
Hgb A: 0 % — ABNORMAL LOW (ref 96.4–98.8)

## 2016-11-04 IMAGING — CT CT ABD-PELV W/ CM
2 of 5 series · 8 of 46 positions shown, 9 images · IV contrast (Iodine)
Comparison: CT exams dating back through 05/04/2014 most recent
01/09/2016

CLINICAL DATA: Pain and tingling in both lower extremities. History
of diabetes.

EXAM:
CT CHEST, ABDOMEN, AND PELVIS WITH CONTRAST
TECHNIQUE: Multidetector CT imaging of the chest, abdomen and pelvis was
performed following the standard protocol during bolus
administration of intravenous contrast.
CONTRAST:  100 BEG923-SRR IOPAMIDOL (BEG923-SRR) INJECTION 61%

[Series 201: cap with, idose (2) · axial · 0.68mm/px · z∈[+531,+966]mm · 5 of 119 slices shown, 6 images]
[im 16/119  soft-tissue]
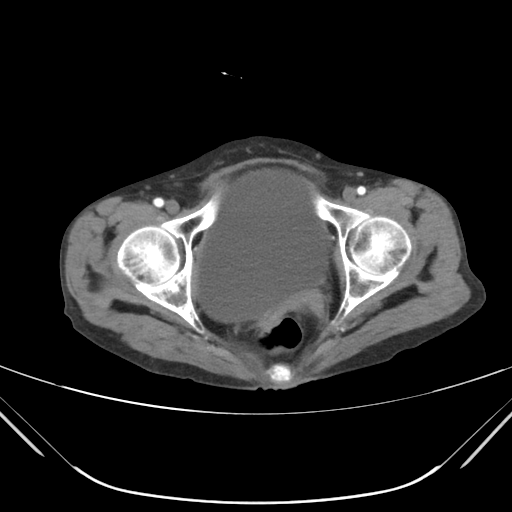
[im 16/119  bone]
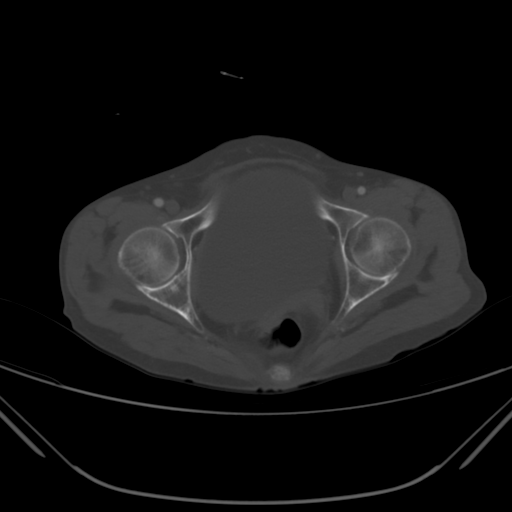
[im 40/119  soft-tissue]
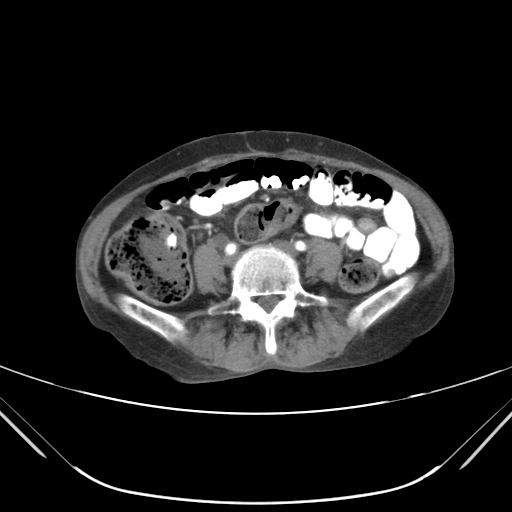
[im 63/119  soft-tissue]
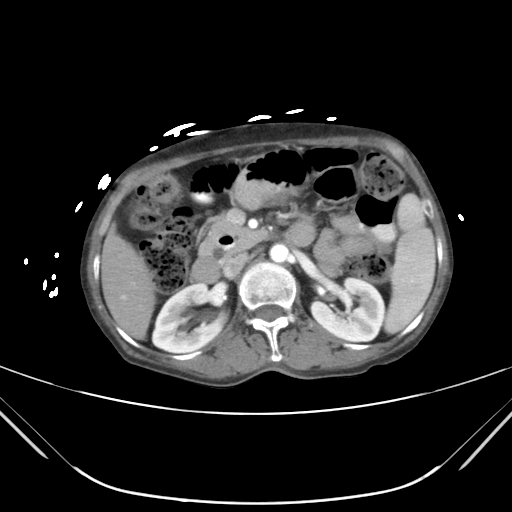
[im 79/119  soft-tissue]
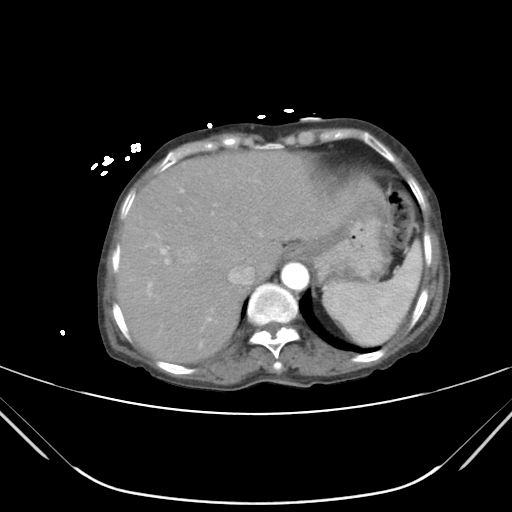
[im 103/119  soft-tissue]
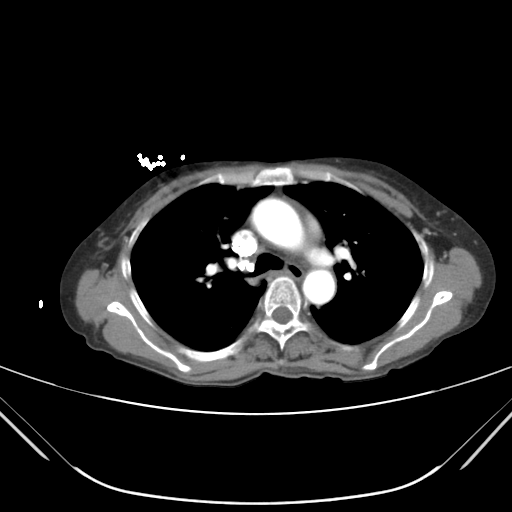

[Series 203: coronals, idose (2) · coronal · 0.45mm/px · 3 of 96 slices shown]
[im 32/96  soft-tissue]
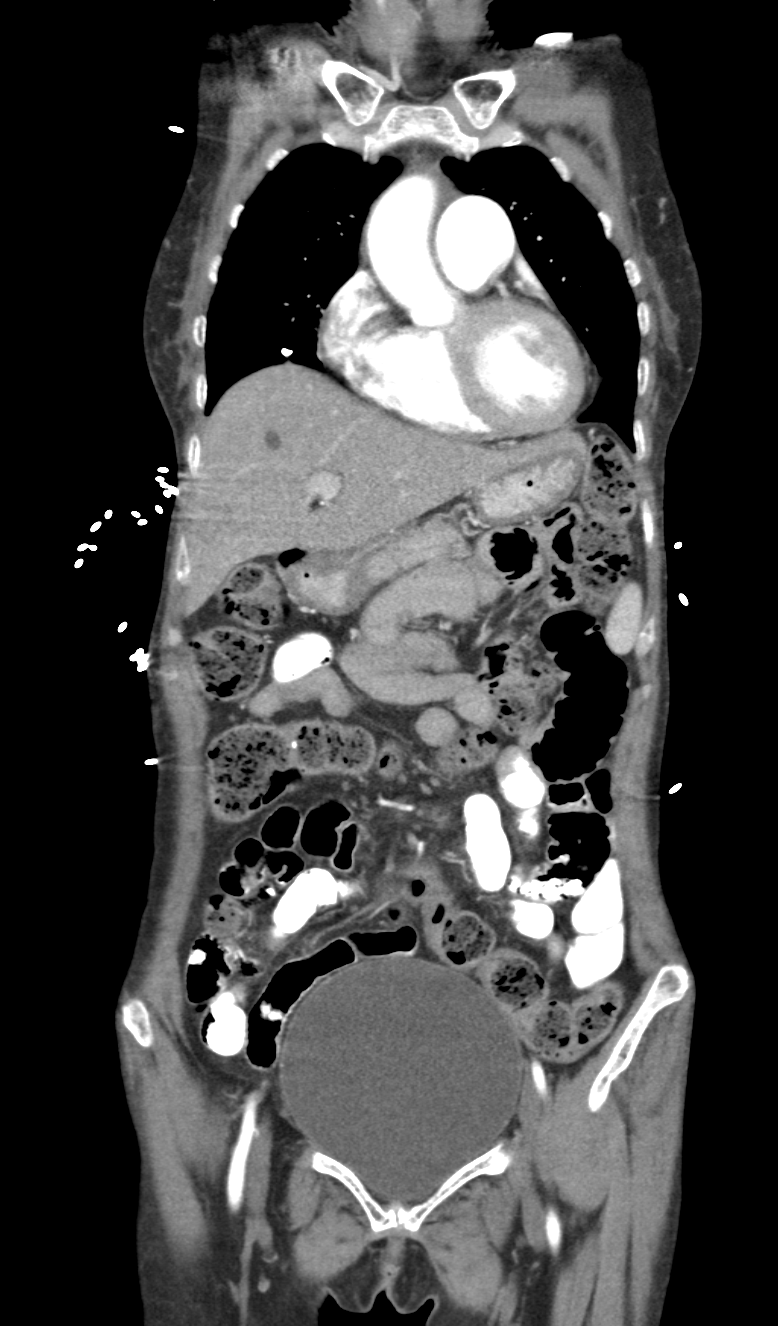
[im 43/96  soft-tissue]
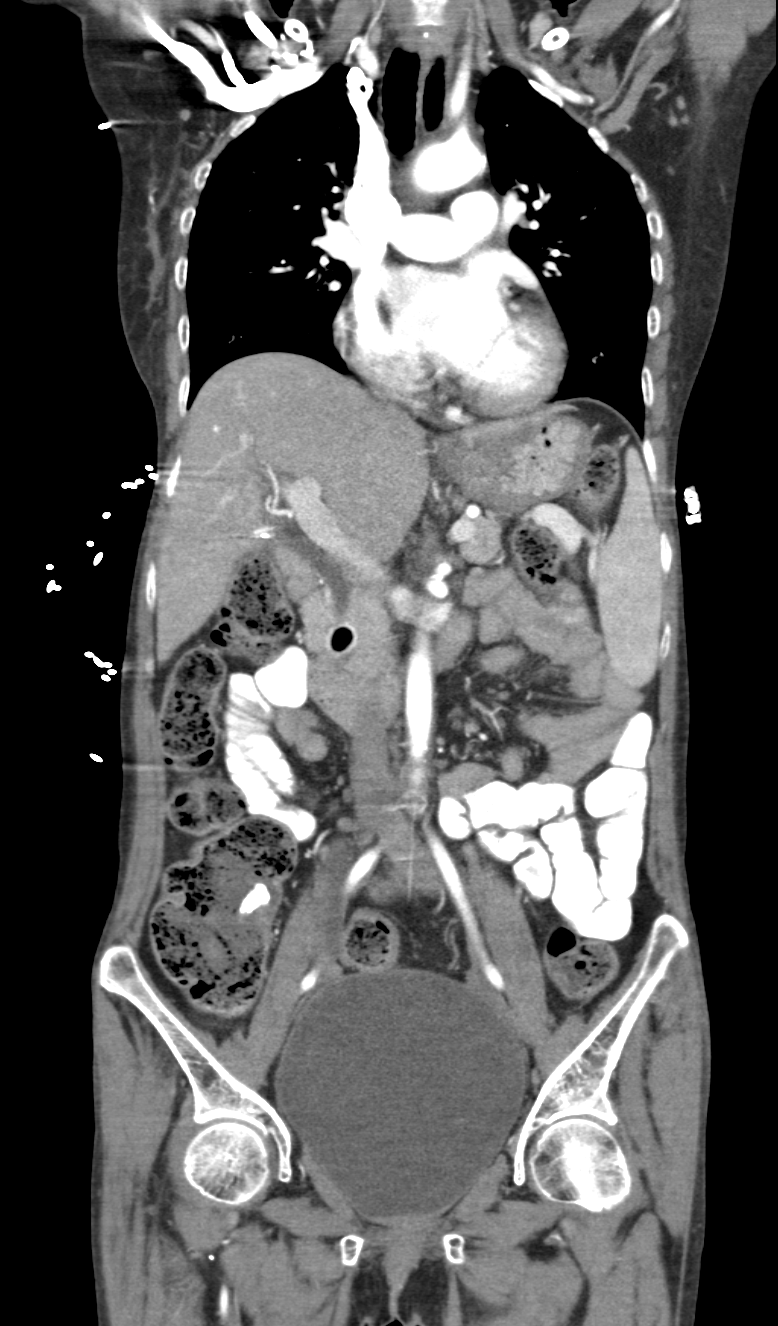
[im 53/96  soft-tissue]
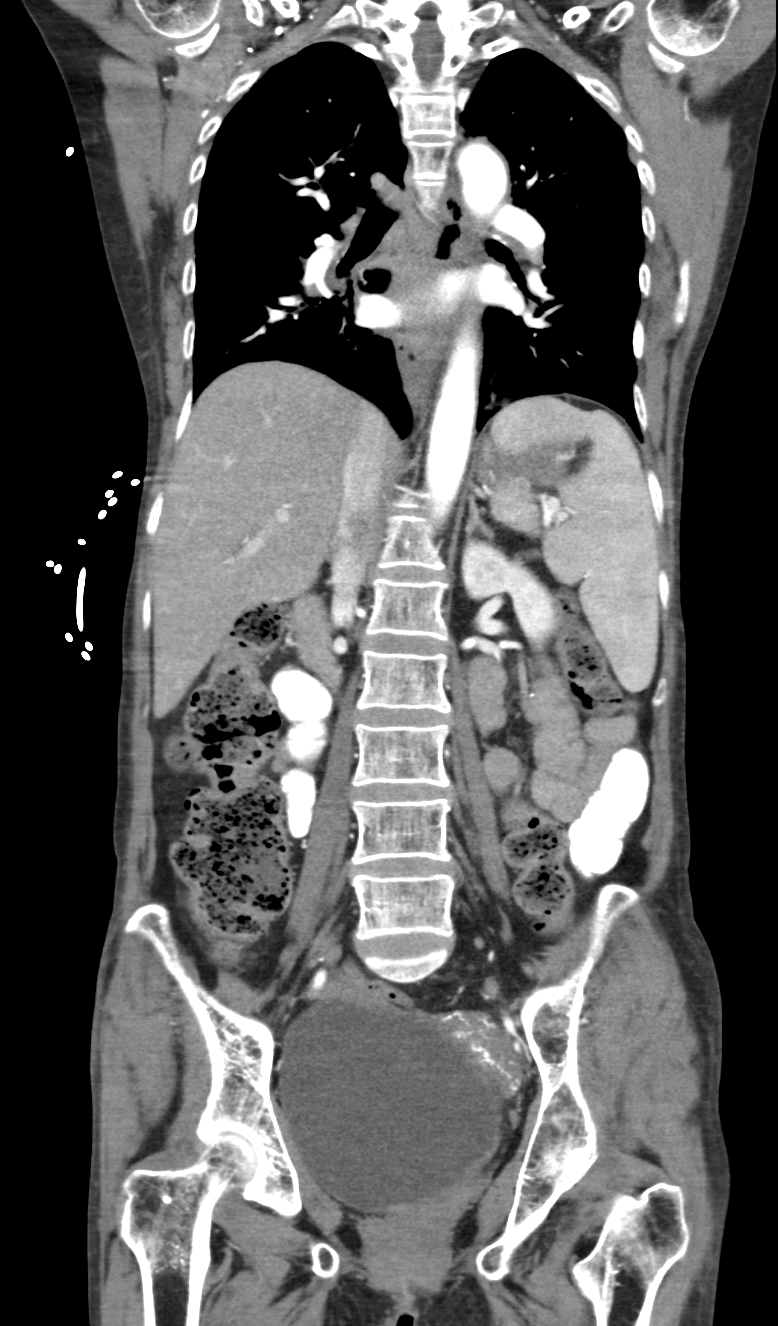

[8 of 46 positions shown; findings below may reference images not displayed]

FINDINGS: CT CHEST FINDINGS

Cardiovascular: Top normal size cardiac chambers. Minimal coronary
arteriosclerosis. No pericardial effusion. The main pulmonary artery
is dilated up to 3.3 cm versus the ascending aorta at 3 cm. No
aortic dissection nor aneurysm.

Mediastinum/Nodes: No mediastinal nor hilar adenopathy. Small
calcified right paratracheal lymph nodes. No axillary
lymphadenopathy.

Lungs/Pleura: Chronic superior segment left lower lobe consolidation
along the descending thoracic aorta. Tiny tree-in-bud densities in
the both lower lobes consistent bronchiolitis. Chronic stable
thick-walled pneumatocele. Stable subpleural right lower lobe
lateral basal 15-a 16 mm masslike opacity possibly representing
rounded atelectasis.

Musculoskeletal: No acute osseous abnormality

Other: Stable hypodense thyroid nodules the largest on the left
measuring 22 mm.

CT ABDOMEN PELVIS FINDINGS

Hepatobiliary: 8 mm hypodensity in the anterior right hepatic lobe
unchanged in appearance possibly a cyst or hemangioma.
Cholecystectomy with mild dextro hepatic ductal dilatation which can
be seen in the setting of prior cholecystectomy. This is stable in
appearance.

Pancreas: Unremarkable. No pancreatic ductal dilatation or
surrounding inflammatory changes.

Spleen: Normal in size without focal abnormality.

Adrenals/Urinary Tract: Adrenal glands are unremarkable. Kidneys are
normal, without renal calculi, focal lesion, or hydronephrosis.
Bladder is prominently distended measuring 11.2 by 9.8 by 13.3 cm.

Stomach/Bowel: Moderate to marked fecal retention consistent with
constipation.

Vascular/Lymphatic: No abdominal aortic aneurysm or dissection. Mild
atherosclerosis of the abdominal aorta common iliacs. Celiac axis,
SMA, bilateral renal arteries and IMA are patent.

Reproductive: Uterus and bilateral adnexa are unremarkable for age.

Other: No free air nor free fluid

Musculoskeletal: Disc bulges at L2-3, L3-4, L4-5 and L5/S1 causing
moderate to marked spinal stenosis at L4-5 due to moderate central
disc bulge and ligamentum flavum hypertrophy. Moderate central disc
bulging at L3-4 and impressing upon the ventral aspect of thecal sac
and encroaching bilaterally on the neural foramina by without
contacting the exiting nerves. Mild to moderate central to right
lateral disc bulge narrowing the right neural foramen and impressing
upon the right paracentral portion of the thecal sac. No acute
osseous abnormality
IMPRESSION: Multilevel moderate-sized lumbar disc bulges from L2-3 through L5-S1
most prominent at L4-5 causing moderate-to-marked canal stenosis due
to disc bulge and ligamentum flavum hypertrophy.

Tree-in-bud appearance of pulmonary interstitium in both lower lobes
which can be seen in bronchitis.

Stable subpleural rounded 15 mm masslike opacity in the right lower
lobe that may reflect rounded atelectasis. As neoplasm is not
entirely excluded, CT surveillance/followup is recommended in 3
months. This area was obscured by pulmonary consolidation on the

Dilated main pulmonary artery suggesting pulmonary hypertension.

Unchanged slightly thick-walled pneumatocele in the right lower
lobe. Small postinfectious cavitation not excluded.

Distended bladder. If the patient is not able to void, consider
Foley catheterization to decompress the bladder.

## 2016-11-06 ENCOUNTER — Telehealth: Payer: Self-pay | Admitting: Internal Medicine

## 2016-11-06 NOTE — Telephone Encounter (Signed)
Pt's son called and he is aware of his mom results and the endocrinology referral

## 2017-01-05 ENCOUNTER — Other Ambulatory Visit: Payer: Self-pay | Admitting: Internal Medicine

## 2017-01-22 ENCOUNTER — Telehealth: Payer: Self-pay | Admitting: Internal Medicine

## 2017-01-22 MED ORDER — INSULIN PEN NEEDLE 32G X 4 MM MISC: 2 refills | Status: DC

## 2017-01-22 NOTE — Telephone Encounter (Signed)
Pen needles refilled 

## 2017-01-22 NOTE — Telephone Encounter (Signed)
Pt called to request a refill for Insulin Pen Needle (ULTICARE MINI PEN NEEDLES) 31G X 6 MM MISC   please follow up

## 2017-01-28 ENCOUNTER — Ambulatory Visit: Payer: Self-pay | Admitting: Internal Medicine

## 2017-02-02 ENCOUNTER — Ambulatory Visit: Payer: Medicaid Other | Attending: Internal Medicine | Admitting: Internal Medicine

## 2017-02-02 ENCOUNTER — Encounter: Payer: Self-pay | Admitting: Internal Medicine

## 2017-02-02 VITALS — BP 134/78 | HR 69 | Temp 98.4°F | Resp 16 | Wt 90.0 lb

## 2017-02-02 DIAGNOSIS — Z1211 Encounter for screening for malignant neoplasm of colon: Secondary | ICD-10-CM | POA: Diagnosis not present

## 2017-02-02 DIAGNOSIS — Z23 Encounter for immunization: Secondary | ICD-10-CM

## 2017-02-02 DIAGNOSIS — D563 Thalassemia minor: Secondary | ICD-10-CM | POA: Insufficient documentation

## 2017-02-02 DIAGNOSIS — Z780 Asymptomatic menopausal state: Secondary | ICD-10-CM | POA: Diagnosis not present

## 2017-02-02 DIAGNOSIS — K219 Gastro-esophageal reflux disease without esophagitis: Secondary | ICD-10-CM | POA: Insufficient documentation

## 2017-02-02 DIAGNOSIS — M5416 Radiculopathy, lumbar region: Secondary | ICD-10-CM | POA: Insufficient documentation

## 2017-02-02 DIAGNOSIS — Z794 Long term (current) use of insulin: Secondary | ICD-10-CM | POA: Insufficient documentation

## 2017-02-02 DIAGNOSIS — E1142 Type 2 diabetes mellitus with diabetic polyneuropathy: Secondary | ICD-10-CM | POA: Insufficient documentation

## 2017-02-02 DIAGNOSIS — D560 Alpha thalassemia: Secondary | ICD-10-CM | POA: Diagnosis not present

## 2017-02-02 DIAGNOSIS — E042 Nontoxic multinodular goiter: Secondary | ICD-10-CM | POA: Insufficient documentation

## 2017-02-02 DIAGNOSIS — G8929 Other chronic pain: Secondary | ICD-10-CM | POA: Insufficient documentation

## 2017-02-02 DIAGNOSIS — E1140 Type 2 diabetes mellitus with diabetic neuropathy, unspecified: Secondary | ICD-10-CM | POA: Diagnosis present

## 2017-02-02 DIAGNOSIS — E43 Unspecified severe protein-calorie malnutrition: Secondary | ICD-10-CM | POA: Diagnosis not present

## 2017-02-02 DIAGNOSIS — Z8611 Personal history of tuberculosis: Secondary | ICD-10-CM | POA: Diagnosis not present

## 2017-02-02 DIAGNOSIS — M546 Pain in thoracic spine: Secondary | ICD-10-CM | POA: Diagnosis not present

## 2017-02-02 LAB — POCT GLYCOSYLATED HEMOGLOBIN (HGB A1C): HEMOGLOBIN A1C: 8.2

## 2017-02-02 LAB — GLUCOSE, POCT (MANUAL RESULT ENTRY): POC GLUCOSE: 261 mg/dL — AB (ref 70–99)

## 2017-02-02 MED ORDER — ACETAMINOPHEN 500 MG PO TABS: 500.0000 mg | ORAL_TABLET | Freq: Two times a day (BID) | ORAL | 0 refills | Status: DC | PRN

## 2017-02-02 MED ORDER — METFORMIN HCL 500 MG PO TABS: 250.0000 mg | ORAL_TABLET | Freq: Every day | ORAL | 3 refills | Status: DC

## 2017-02-02 MED ORDER — CALCIUM CARBONATE-VITAMIN D 500-200 MG-UNIT PO TABS: 1.0000 | ORAL_TABLET | Freq: Two times a day (BID) | ORAL | 3 refills | Status: DC

## 2017-02-02 MED ORDER — INSULIN GLARGINE 100 UNIT/ML SOLOSTAR PEN: 25.0000 [IU] | PEN_INJECTOR | Freq: Every day | SUBCUTANEOUS | 5 refills | Status: DC

## 2017-02-02 MED ORDER — METFORMIN HCL 500 MG PO TABS: 250.0000 mg | ORAL_TABLET | Freq: Two times a day (BID) | ORAL | 3 refills | Status: DC

## 2017-02-02 NOTE — Addendum Note (Signed)
Addended by: Carolynne EdouardPOLLOCK, JAY'A R on: 02/02/2017 01:44 PM   Modules accepted: Orders

## 2017-02-02 NOTE — Patient Instructions (Signed)
You can purchase Glucerna Shakes to supplement your meals.   Start taking Metformin 500 mg 1/2 tab daily.  Take Tylenol 500 mg twice a day as needed for pain

## 2017-02-02 NOTE — Progress Notes (Signed)
Patient ID: Olivia Williamson, female    DOB: [birth year]  MRN: [id number]  CC: Follow-up   Subjective: Olivia Williamson is a 71 y.o. female who presents for chronic ds management. Son and interpreter, Grover CanavanYang Hmok, from [employer] are with her today.  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 C/o pain in back from LT neck down and into LT leg x 3 mth -sharp in character, intermittent lasting 1-2 days, then returns after 3 days. Rates pain as 4/10 -+ tingling, burning and numbness in back and in LT leg. -Interrupts her sleep No fevers -no weakness in legs, just pain. Larey SeatFell in March of this yr -nothing makes it worse. Not taking any medication -Not sure what makes it better  2. DM: Checking BS 1-2 x a day. Does not have log or glucometer with her. A.m range 78-185, afternoons up to 270.  Occasional hypoglycemia. She can tell when BS low Eating 2-3 meals a day but little appetite. No supplement shakes.  Taking Lantus 25 units daily. Needs RF.   3.  Thyroid Nodules -has appt 04/01/2017 with endocrinologist per son -large LT side nodules sometimes causes problems swallowing. No SOB when she lays down  4. Anemia: Blood test revealed alpha thalassemia mutation known as HbCS   Patient Active Problem List   Diagnosis Date Noted  . 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 Multinodular goiter 11/01/2016  . Thyroid nodule 04/13/2016  . Abnormal CT of the chest   . Tuberculosis 01/09/2016  . Protein-calorie malnutrition, severe 12/16/2015  . Generalized weakness 12/15/2015  . GERD (gastroesophageal reflux disease)   . Diabetes mellitus with neurological manifestations [facility](HCC)      Current Outpatient Prescriptions on File Prior to Visit  Medication Sig Dispense Refill  . ferrous sulfate (FERROUSUL) 325 (65 FE) MG tablet Take 1 tablet (325 mg  total) by mouth daily with breakfast. 100 tablet 1  . gabapentin (NEURONTIN) 300 MG capsule Take 2 capsules (600 mg total) by mouth 2 (two) times daily. 120 capsule 6  . Insulin Pen Needle (BD PEN NEEDLE NANO U/F) 32G X 4 MM MISC USE TO INJECT INSULIN 100 each 2   No current facility-administered medications on file prior to visit.     Allergies  Allergen Reactions  . Beef-Derived Products Other (See Comments)    Generalized burning sensation  . Eggs Or Egg-Derived Products Other (See Comments)    Generalized burning sensation  . Fish Allergy Other (See Comments)    Generalized burning sensation  . Fruit & Vegetable Daily [Nutritional Supplements] Other (See Comments)    Orange causes lower extremity burning    Social History   Social History  . Marital status: Widowed    Spouse name: N/A  . Number of children: N/A  . Years of education: N/A   Occupational History  . Not on file.   Social History Main Topics  . Smoking status: Never Smoker  . Smokeless tobacco: Never Used  . Alcohol use No  . Drug use: No  . Sexual activity: Not on file   Other Topics Concern  . Not on file   Social History Narrative  . No narrative on file    Family History  Problem Relation Age of Onset  . Heart disease Maternal Uncle [id number]    Past Surgical History:  Procedure Laterality  Date  . ABDOMINAL HYSTERECTOMY    . CHOLECYSTECTOMY N/A 05/02/2014   Procedure: LAPAROSCOPIC CHOLECYSTECTOMY ;  Surgeon: Abigail Miyamoto, MD;  Location: [facility] OR;  Service: General;  Laterality: N/A;  laparoscopic cholecystectomy  . COLONOSCOPY    . NO PAST SURGERIES    . VIDEO BRONCHOSCOPY Bilateral 01/10/2016   Procedure: VIDEO BRONCHOSCOPY WITHOUT FLUORO;  Surgeon: Leslye Peer, MD;  Location: [facility] ENDOSCOPY;  Service: Cardiopulmonary;  Laterality: Bilateral;    ROS: Review of Systems negative except as above PHYSICAL EXAM: BP 134/78   Pulse 69   Temp 98.4 F (36.9 C) (Oral)   Resp 16   Wt 90 lb (40.8  kg)   LMP 05/11/2016   SpO2 99%   BMI 17.58 kg/m   Wt Readings from Last 3 Encounters:  02/02/17 90 lb (40.8 kg)  10/27/16 90 lb (40.8 kg)  06/09/16 81 lb 3.2 oz (36.8 kg)    Physical Exam General appearance -pleasant elderly female in NAD. Mental status - oriented to person and place. Answers questions appropriately. Eyes -pale conjunctiva  Mouth -moist mucosa.  Neck - palpable 3 cm thyroid nodule on the left side, no cervical lymphadenopathy Chest -breath sounds somewhat decreased but no wheezes or crackles heard Heart - normal rate, regular rhythm, normal S1, S2, no murmurs, rubs, clicks or gallops Extremities - peripheral pulses normal, no pedal edema, no clubbing or cyanosis MSK: Mild tenderness on palpation of the cervical thoracic and lumbar spine. Most pain on palpation is in the mid thoracic spine. Power in upper extremities are 4+ out of 5 bilaterally with gross sensation intact. Lower extremities: Decreased sensation to light and gross touch in the left lower leg and foot. Normal sensation on the right. Power in lower extremities 5/5 right. 4+/5 left  Thyroid U/S IMPRESSION: Multinodular goiter.  The palpable lesion on the left side of the neck probably represents a predominantly cystic nodule. This cyst does not meet criteria for biopsy.  2.1 cm solid lesion with peripheral calcifications in the mid right thyroid lobe. This nodule does meet criteria for ultrasound-guided biopsy.  Additional nodules meet criteria for 1 year follow-up as described.  Results for orders placed or performed in visit on 02/02/17  POCT glucose (manual entry)  Result Value Ref Range   POC Glucose 261 (A) 70 - 99 mg/dl   W0J today: 8.8  ASSESSMENT AND PLAN: 1. Type 2 diabetes mellitus with diabetic polyneuropathy, with long-term current use of insulin (HCC) -not at goal Continue Lantus 25 units. Add metformin 250 mg daily. -Recommend Glucerna shakes to supplement meals - POCT  glucose (manual entry) - Insulin Glargine (LANTUS SOLOSTAR) 100 UNIT/ML Solostar Pen; Inject 25 Units into the skin daily at 10 pm.  Dispense: 5 pen; Refill: 5 - Comprehensive metabolic panel - metFORMIN (GLUCOPHAGE) 500 MG tablet; Take 0.5 tablets (250 mg total) by mouth daily with breakfast.  Dispense: 45 tablet; Refill: 3  2. Chronic left-sided thoracic back pain 3. Lumbar radiculopathy -given symptoms and treatment in past yr for active TB, will image spine. She has hx of DM peripheral neuropathy but has new symptoms and exam findings to suggest lumbar radiculopathy - MR Thoracic Spine Wo Contrast; Future - acetaminophen (TYLENOL) 500 MG tablet; Take 1 tablet (500 mg total) by mouth 2 (two) times daily as needed for mild pain.  Dispense: 100 tablet; Refill: 0 - MR Lumbar Spine Wo Contrast; Future  4. Need for influenza vaccination - Flu Vaccine QUAD 6+ mos PF IM (Fluarix Quad  PF)  5. Postmenopausal state - DG Bone Density; Future - calcium-vitamin D (OSCAL 500/200 D-3) 500-200 MG-UNIT tablet; Take 1 tablet by mouth 2 (two) times daily.  Dispense: 120 tablet; Refill: 3  6. Thalassemia alpha carrier - Iron, TIBC and Ferritin Panel  7. Colon cancer screening - Ambulatory referral to Gastroenterology  8. Thyroid nodules -Keep appointment with endocrine in November  Patient was given the opportunity to ask questions.  Patient verbalized understanding of the plan and was able to repeat key elements of the plan.  I spent 40 minutes with this patient evaluating her symptoms, counseling and coordinating care  Orders Placed This Encounter  Procedures  . DG Bone Density  . MR Lumbar Spine Wo Contrast  . MR Thoracic Spine Wo Contrast  . Flu Vaccine QUAD 6+ mos PF IM (Fluarix Quad PF)  . Comprehensive metabolic panel  . Iron, TIBC and Ferritin Panel  . Ambulatory referral to Gastroenterology  . POCT glucose (manual entry)     Requested Prescriptions   Signed Prescriptions Disp  Refills  . Insulin Glargine (LANTUS SOLOSTAR) 100 UNIT/ML Solostar Pen 5 pen 5    Sig: Inject 25 Units into the skin daily at 10 pm.  . calcium-vitamin D (OSCAL 500/200 D-3) 500-200 MG-UNIT tablet 120 tablet 3    Sig: Take 1 tablet by mouth 2 (two) times daily.  Marland Kitchen acetaminophen (TYLENOL) 500 MG tablet 100 tablet 0    Sig: Take 1 tablet (500 mg total) by mouth 2 (two) times daily as needed for mild pain.  . metFORMIN (GLUCOPHAGE) 500 MG tablet 45 tablet 3    Sig: Take 0.5 tablets (250 mg total) by mouth daily with breakfast.    Return in about 1 month (around 03/04/2017).  Jonah Blue, MD, FACP

## 2017-02-03 LAB — COMPREHENSIVE METABOLIC PANEL
ALT: 21 IU/L (ref 0–32)
AST: 24 IU/L (ref 0–40)
Albumin/Globulin Ratio: 1.2 (ref 1.2–2.2)
Albumin: 3.8 g/dL (ref 3.6–4.8)
Alkaline Phosphatase: 136 IU/L — ABNORMAL HIGH (ref 39–117)
BILIRUBIN TOTAL: 0.5 mg/dL (ref 0.0–1.2)
BUN / CREAT RATIO: 29 — AB (ref 12–28)
BUN: 23 mg/dL (ref 8–27)
CHLORIDE: 95 mmol/L — AB (ref 96–106)
CO2: 25 mmol/L (ref 20–29)
Calcium: 9.4 mg/dL (ref 8.7–10.3)
Creatinine, Ser: 0.8 mg/dL (ref 0.57–1.00)
GFR calc non Af Amer: 76 mL/min/{1.73_m2} (ref >59)
GFR, EST AFRICAN AMERICAN: 88 mL/min/{1.73_m2} (ref >59)
GLOBULIN, TOTAL: 3.2 g/dL (ref 1.5–4.5)
GLUCOSE: 228 mg/dL — AB (ref 65–99)
Potassium: 4.9 mmol/L (ref 3.5–5.2)
Sodium: 135 mmol/L (ref 134–144)
TOTAL PROTEIN: 7 g/dL (ref 6.0–8.5)

## 2017-02-03 LAB — IRON,TIBC AND FERRITIN PANEL
FERRITIN: 297 ng/mL — AB (ref 15–150)
IRON SATURATION: 24 % (ref 15–55)
Iron: 61 ug/dL (ref 27–139)
Total Iron Binding Capacity: 252 ug/dL (ref 250–450)
UIBC: 191 ug/dL (ref 118–369)

## 2017-02-05 ENCOUNTER — Other Ambulatory Visit: Payer: Self-pay | Admitting: Internal Medicine

## 2017-02-05 DIAGNOSIS — E2839 Other primary ovarian failure: Secondary | ICD-10-CM

## 2017-02-10 ENCOUNTER — Ambulatory Visit (HOSPITAL_COMMUNITY)
Admission: RE | Admit: 2017-02-10 | Discharge: 2017-02-10 | Disposition: A | Discharge status: Home / Self Care | Payer: Medicaid Other | Source: Ambulatory Visit | Attending: Internal Medicine | Admitting: Internal Medicine

## 2017-02-10 DIAGNOSIS — M4856XA Collapsed vertebra, not elsewhere classified, lumbar region, initial encounter for fracture: Secondary | ICD-10-CM | POA: Insufficient documentation

## 2017-02-10 DIAGNOSIS — M5416 Radiculopathy, lumbar region: Secondary | ICD-10-CM

## 2017-02-10 DIAGNOSIS — M4804 Spinal stenosis, thoracic region: Secondary | ICD-10-CM | POA: Diagnosis not present

## 2017-02-10 DIAGNOSIS — E041 Nontoxic single thyroid nodule: Secondary | ICD-10-CM | POA: Diagnosis not present

## 2017-02-10 DIAGNOSIS — G8929 Other chronic pain: Secondary | ICD-10-CM

## 2017-02-10 DIAGNOSIS — M48061 Spinal stenosis, lumbar region without neurogenic claudication: Secondary | ICD-10-CM | POA: Insufficient documentation

## 2017-02-10 DIAGNOSIS — M5116 Intervertebral disc disorders with radiculopathy, lumbar region: Secondary | ICD-10-CM | POA: Diagnosis not present

## 2017-02-10 DIAGNOSIS — M546 Pain in thoracic spine: Principal | ICD-10-CM

## 2017-02-14 ENCOUNTER — Telehealth: Payer: Self-pay | Admitting: Internal Medicine

## 2017-02-14 DIAGNOSIS — M5416 Radiculopathy, lumbar region: Secondary | ICD-10-CM

## 2017-02-14 DIAGNOSIS — M8008XS Age-related osteoporosis with current pathological fracture, vertebra(e), sequela: Secondary | ICD-10-CM

## 2017-02-14 NOTE — Telephone Encounter (Signed)
MRI of thoracic and lumbar spine report reviewed. Pt with severe compression fx of T12 and L1, lumbar spinal stenosis and disc protrusion  At L2-L3. Will refer to pain specialist and neurosurgeon.  Will plan to put her on bisphosphonate on f/u visit. Will have my CMA try to reach pt/son to inform of results of MRI and plan.  She has appt with me early next mth.

## 2017-02-16 NOTE — Telephone Encounter (Signed)
Contacted pt son and spoke with him regarding her MRI results pt son is aware and doesn't have any questions or concerns

## 2017-04-06 ENCOUNTER — Encounter: Payer: Self-pay | Admitting: Internal Medicine

## 2017-04-06 ENCOUNTER — Encounter (HOSPITAL_COMMUNITY): Payer: Self-pay

## 2017-04-06 ENCOUNTER — Other Ambulatory Visit: Payer: Self-pay

## 2017-04-06 ENCOUNTER — Ambulatory Visit: Payer: Medicaid Other | Attending: Internal Medicine | Admitting: Internal Medicine

## 2017-04-06 ENCOUNTER — Emergency Department (HOSPITAL_COMMUNITY)
Admission: EM | Admit: 2017-04-06 | Discharge: 2017-04-06 | Disposition: A | Discharge status: Home / Self Care | Payer: Medicaid Other | Attending: Emergency Medicine | Admitting: Emergency Medicine

## 2017-04-06 ENCOUNTER — Emergency Department (HOSPITAL_COMMUNITY): Payer: Medicaid Other

## 2017-04-06 VITALS — BP 78/53 | HR 73 | Temp 97.6°F | Resp 16 | Wt 93.0 lb

## 2017-04-06 DIAGNOSIS — W19XXXA Unspecified fall, initial encounter: Secondary | ICD-10-CM | POA: Insufficient documentation

## 2017-04-06 DIAGNOSIS — Z79899 Other long term (current) drug therapy: Secondary | ICD-10-CM | POA: Insufficient documentation

## 2017-04-06 DIAGNOSIS — Z794 Long term (current) use of insulin: Secondary | ICD-10-CM | POA: Insufficient documentation

## 2017-04-06 DIAGNOSIS — K219 Gastro-esophageal reflux disease without esophagitis: Secondary | ICD-10-CM | POA: Insufficient documentation

## 2017-04-06 DIAGNOSIS — Z09 Encounter for follow-up examination after completed treatment for conditions other than malignant neoplasm: Secondary | ICD-10-CM | POA: Insufficient documentation

## 2017-04-06 DIAGNOSIS — E042 Nontoxic multinodular goiter: Secondary | ICD-10-CM | POA: Insufficient documentation

## 2017-04-06 DIAGNOSIS — I951 Orthostatic hypotension: Secondary | ICD-10-CM | POA: Insufficient documentation

## 2017-04-06 DIAGNOSIS — E43 Unspecified severe protein-calorie malnutrition: Secondary | ICD-10-CM | POA: Diagnosis not present

## 2017-04-06 DIAGNOSIS — Z8611 Personal history of tuberculosis: Secondary | ICD-10-CM | POA: Insufficient documentation

## 2017-04-06 DIAGNOSIS — E1142 Type 2 diabetes mellitus with diabetic polyneuropathy: Secondary | ICD-10-CM | POA: Diagnosis not present

## 2017-04-06 DIAGNOSIS — M48061 Spinal stenosis, lumbar region without neurogenic claudication: Secondary | ICD-10-CM | POA: Diagnosis not present

## 2017-04-06 DIAGNOSIS — I10 Essential (primary) hypertension: Secondary | ICD-10-CM | POA: Diagnosis not present

## 2017-04-06 DIAGNOSIS — R5383 Other fatigue: Secondary | ICD-10-CM | POA: Insufficient documentation

## 2017-04-06 DIAGNOSIS — S82832A Other fracture of upper and lower end of left fibula, initial encounter for closed fracture: Secondary | ICD-10-CM | POA: Diagnosis not present

## 2017-04-06 DIAGNOSIS — R627 Adult failure to thrive: Secondary | ICD-10-CM | POA: Insufficient documentation

## 2017-04-06 DIAGNOSIS — R54 Age-related physical debility: Secondary | ICD-10-CM | POA: Diagnosis not present

## 2017-04-06 DIAGNOSIS — E1149 Type 2 diabetes mellitus with other diabetic neurological complication: Secondary | ICD-10-CM | POA: Insufficient documentation

## 2017-04-06 DIAGNOSIS — Y929 Unspecified place or not applicable: Secondary | ICD-10-CM | POA: Diagnosis not present

## 2017-04-06 DIAGNOSIS — Y939 Activity, unspecified: Secondary | ICD-10-CM | POA: Diagnosis not present

## 2017-04-06 DIAGNOSIS — E860 Dehydration: Secondary | ICD-10-CM | POA: Insufficient documentation

## 2017-04-06 DIAGNOSIS — I959 Hypotension, unspecified: Secondary | ICD-10-CM | POA: Diagnosis not present

## 2017-04-06 DIAGNOSIS — Y999 Unspecified external cause status: Secondary | ICD-10-CM | POA: Diagnosis not present

## 2017-04-06 DIAGNOSIS — R531 Weakness: Secondary | ICD-10-CM | POA: Diagnosis present

## 2017-04-06 DIAGNOSIS — M5116 Intervertebral disc disorders with radiculopathy, lumbar region: Secondary | ICD-10-CM | POA: Insufficient documentation

## 2017-04-06 DIAGNOSIS — M79672 Pain in left foot: Secondary | ICD-10-CM

## 2017-04-06 DIAGNOSIS — R42 Dizziness and giddiness: Secondary | ICD-10-CM | POA: Diagnosis not present

## 2017-04-06 DIAGNOSIS — M4854XA Collapsed vertebra, not elsewhere classified, thoracic region, initial encounter for fracture: Secondary | ICD-10-CM | POA: Insufficient documentation

## 2017-04-06 LAB — CBC WITH DIFFERENTIAL/PLATELET
BASOS ABS: 0.1 10*3/uL (ref 0.0–0.1)
Basophils Relative: 1 %
EOS ABS: 0.1 10*3/uL (ref 0.0–0.7)
Eosinophils Relative: 1 %
HCT: 28.3 % — ABNORMAL LOW (ref 36.0–46.0)
Hemoglobin: 9.2 g/dL — ABNORMAL LOW (ref 12.0–15.0)
LYMPHS ABS: 1.5 10*3/uL (ref 0.7–4.0)
Lymphocytes Relative: 19 %
MCH: 21.7 pg — ABNORMAL LOW (ref 26.0–34.0)
MCHC: 32.5 g/dL (ref 30.0–36.0)
MCV: 66.9 fL — ABNORMAL LOW (ref 78.0–100.0)
MONO ABS: 0.5 10*3/uL (ref 0.1–1.0)
Monocytes Relative: 6 %
Neutro Abs: 5.9 10*3/uL (ref 1.7–7.7)
Neutrophils Relative %: 73 %
PLATELETS: 102 10*3/uL — AB (ref 150–400)
RBC: 4.23 MIL/uL (ref 3.87–5.11)
RDW: 16 % — AB (ref 11.5–15.5)
WBC: 8.1 10*3/uL (ref 4.0–10.5)

## 2017-04-06 LAB — COMPREHENSIVE METABOLIC PANEL
ALBUMIN: 3.6 g/dL (ref 3.5–5.0)
ALK PHOS: 135 U/L — AB (ref 38–126)
ALT: 16 U/L (ref 14–54)
AST: 19 U/L (ref 15–41)
Anion gap: 9 (ref 5–15)
BILIRUBIN TOTAL: 1 mg/dL (ref 0.3–1.2)
BUN: 32 mg/dL — AB (ref 6–20)
CALCIUM: 9.6 mg/dL (ref 8.9–10.3)
CO2: 25 mmol/L (ref 22–32)
Chloride: 102 mmol/L (ref 101–111)
Creatinine, Ser: 1.14 mg/dL — ABNORMAL HIGH (ref 0.44–1.00)
GFR calc Af Amer: 56 mL/min — ABNORMAL LOW (ref >60)
GFR calc non Af Amer: 48 mL/min — ABNORMAL LOW (ref >60)
Glucose, Bld: 139 mg/dL — ABNORMAL HIGH (ref 65–99)
Potassium: 4.5 mmol/L (ref 3.5–5.1)
Sodium: 136 mmol/L (ref 135–145)
TOTAL PROTEIN: 7.1 g/dL (ref 6.5–8.1)

## 2017-04-06 LAB — URINALYSIS, ROUTINE W REFLEX MICROSCOPIC
Bilirubin Urine: NEGATIVE
GLUCOSE, UA: NEGATIVE mg/dL
HGB URINE DIPSTICK: NEGATIVE
Ketones, ur: NEGATIVE mg/dL
NITRITE: NEGATIVE
Protein, ur: NEGATIVE mg/dL
SPECIFIC GRAVITY, URINE: 1.014 (ref 1.005–1.030)
Squamous Epithelial / LPF: NONE SEEN
pH: 5 (ref 5.0–8.0)

## 2017-04-06 LAB — I-STAT CG4 LACTIC ACID, ED
LACTIC ACID, VENOUS: 1.04 mmol/L (ref 0.5–1.9)
Lactic Acid, Venous: 2.48 mmol/L (ref 0.5–1.9)

## 2017-04-06 LAB — GLUCOSE, POCT (MANUAL RESULT ENTRY): POC GLUCOSE: 120 mg/dL — AB (ref 70–99)

## 2017-04-06 MED ORDER — SODIUM CHLORIDE 0.9 % IV BOLUS (SEPSIS)
1000.0000 mL | Freq: Once | INTRAVENOUS | Status: AC
  Administered 2017-04-06: 1000 mL via INTRAVENOUS

## 2017-04-06 MED ORDER — SODIUM CHLORIDE 0.9 % IV BOLUS (SEPSIS)
1000.0000 mL | Freq: Once | INTRAVENOUS | Status: AC
Start: 2017-04-06 — End: 2017-04-06
  Administered 2017-04-06: 1000 mL via INTRAVENOUS

## 2017-04-06 NOTE — ED Provider Notes (Signed)
Please see previous physicians note regarding patient's presenting history and physical, initial ED course, and associated medical decision making.  71 year old female who presents with orthostatic hypotension and dehydration.  After IV fluids she is much improved.  UA without signs of UTI.  Family is comfortable with discharge and increased p.o. intake at home. Strict return and follow-up instructions reviewed. She expressed understanding of all discharge instructions and felt comfortable with the plan of care.    Lavera GuiseLiu, Amreen Raczkowski Duo, MD 04/06/17 [phone]

## 2017-04-06 NOTE — ED Notes (Signed)
Pt. Speaks [place].

## 2017-04-06 NOTE — ED Notes (Signed)
Pt went to the bathroom, unable to give urine sample.

## 2017-04-06 NOTE — Progress Notes (Signed)
Patient ID: Olivia Williamson, female    DOB: 1945/09/04  MRN: [id number]  CC: Follow-up and Fatigue   Subjective: Olivia BuddMoihh Wilkie is a 71 y.o. female who presents for chronic disease management. Interpreter, Theron AristaYhin Nie and daughter are with her. 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  Back Pain: On last visit patient complained of chronic left LBP MRI done which revealed compression fracture at T12-L1 with spinal stenosis and disc protrusion at L2-L3 Referral submitted to neurosurgeon and pain management. -daughter reports pt taken to a specialist for her back recently by the son but she is not aware what was discussed -pt reports Gabapentin helps but still has pain. Fell yesterday. Felt dizzy and weak, fell and landed on buttock. Twisted LT leg. No palpitations. Did not hit head. No LOC. Daughter heard fall and helped her back to bed. BS was not checked. Still feels dizzy and weak today.  -good appetite but some compression symptoms from large thyroid nodule -some dysuria. No cough or SOB. Pt stays alone during the day when son and daughter out to work  2. Thyroid Nodule:  Saw [facility] last wk. Daughter not sure if bx planned. Pt has f/u appt with them 05/11/2017  3. DM type 2: On last visit Metformin was added Reports BS have been in low 100s. Lowest 87.   Patient Active Problem List   Diagnosis Date Noted  . Lumbar radiculopathy 02/02/2017  . Chronic left-sided thoracic back pain 02/02/2017  . 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 Multinodular goiter 11/01/2016  . Thyroid nodule 04/13/2016  . Abnormal CT of the chest   . Tuberculosis 01/09/2016  . Protein-calorie malnutrition, severe 12/16/2015  . Generalized weakness 12/15/2015  . GERD (gastroesophageal reflux disease)   . Diabetes mellitus with neurological manifestations [facility](HCC)        Current Outpatient Medications on File Prior to Visit  Medication Sig Dispense Refill  . acetaminophen (TYLENOL) 500 MG tablet Take 1 tablet (500 mg total) by mouth 2 (two) times daily as needed for mild pain. 100 tablet 0  . calcium-vitamin D (OSCAL 500/200 D-3) 500-200 MG-UNIT tablet Take 1 tablet by mouth 2 (two) times daily. 120 tablet 3  . ferrous sulfate (FERROUSUL) 325 (65 FE) MG tablet Take 1 tablet (325 mg total) by mouth daily with breakfast. 100 tablet 1  . gabapentin (NEURONTIN) 300 MG capsule Take 2 capsules (600 mg total) by mouth 2 (two) times daily. 120 capsule 6  . Insulin Glargine (LANTUS SOLOSTAR) 100 UNIT/ML Solostar Pen Inject 25 Units into the skin daily at 10 pm. 5 pen 5  . Insulin Pen Needle (BD PEN NEEDLE NANO U/F) 32G X 4 MM MISC USE TO INJECT INSULIN 100 each 2  . metFORMIN (GLUCOPHAGE) 500 MG tablet Take 0.5 tablets (250 mg total) by mouth daily with breakfast. 45 tablet 3   No current facility-administered medications on file prior to visit.     Allergies  Allergen Reactions  . Beef-Derived Products Other (See Comments)    Generalized burning sensation  . Eggs Or Egg-Derived Products Other (See Comments)    Generalized burning sensation  . Fish Allergy Other (See Comments)    Generalized burning sensation  . Fruit & Vegetable Daily [Nutritional Supplements] Other (See Comments)    Orange causes lower extremity burning    Social History  Socioeconomic History  . Marital status: Widowed    Spouse name: Not on file  . Number of children: Not on file  . Years of education: Not on file  . Highest education level: Not on file  Social Needs  . Financial resource strain: Not on file  . Food insecurity - worry: Not on file  . Food insecurity - inability: Not on file  . Transportation needs - medical: Not on file  . Transportation needs - non-medical: Not on file  Occupational History  . Not on file  Tobacco Use  . Smoking status: Never Smoker  .  Smokeless tobacco: Never Used  Substance and Sexual Activity  . Alcohol use: No  . Drug use: No  . Sexual activity: Not on file  Other Topics Concern  . Not on file  Social History Narrative  . Not on file    Family History  Problem Relation Age of Onset  . Heart disease Maternal Uncle [id number]    Past Surgical History:  Procedure Laterality Date  . ABDOMINAL HYSTERECTOMY    . COLONOSCOPY    . NO PAST SURGERIES      ROS: Review of Systems As above PHYSICAL EXAM: BP (!) 78/53   Pulse 73   Temp 97.6 F (36.4 C) (Oral)   Resp 16   Wt 93 lb (42.2 kg)   LMP 05/11/2016   SpO2 100%   BMI 18.16 kg/m   Wt Readings from Last 3 Encounters:  04/06/17 93 lb (42.2 kg)  02/02/17 90 lb (40.8 kg)  10/27/16 90 lb (40.8 kg)  Repeat BP 69/40s  Physical Exam General appearance -frail elderly female in NAD Mental status - alert and oriented Eyes - pale conjuntiva Mouth - moist mucosa Neck - supple. Large LT sided nodule thyroid Chest - clear to auscultation, no wheezes, rales or rhonchi, symmetric air entry Heart - normal rate, regular rhythm, normal S1, S2, no murmurs, rubs, clicks or gallops Musculoskeletal - swelling and tenderness of LT ankle and foot  Results for orders placed or performed in visit on 04/06/17  POCT glucose (manual entry)  Result Value Ref Range   POC Glucose 120 (A) 70 - 99 mg/dl     ASSESSMENT AND PLAN: 1. Fall, initial encounter 2. Frail elderly 3. Hypotension, unspecified hypotension type 4. Dizziness This patient is a frail elderly lady, failure to thrive with compression vertebral fractures who is seen in the office today with report of fall yesterday.  She complains of dizziness and is hypotensive.  Patient sent to the emergency room for further evaluation.  5. Left foot pain Patient will need an x-ray of the left foot and ankle  6. Type 2 diabetes mellitus with diabetic polyneuropathy, with long-term current use of insulin (HCC) - POCT glucose  (manual entry)   Patient was given the opportunity to ask questions.  Patient verbalized understanding of the plan and was able to repeat key elements of the plan.   Orders Placed This Encounter  Procedures  . POCT glucose (manual entry)     Requested Prescriptions    No prescriptions requested or ordered in this encounter    No Follow-up on file.  Jonah Blueeborah Johnson, MD, FACP

## 2017-04-06 NOTE — Progress Notes (Signed)
Orthopedic Tech Progress Note Patient Details:  Olivia BuddMoihh Williamson [birth year] [id number]  Ortho Devices Type of Ortho Device: CAM walker Ortho Device/Splint Location: LLE Ortho Device/Splint Interventions: Ordered, Application   Olivia Williamson, Olivia Williamson 04/06/2017, 4:27 PM

## 2017-04-06 NOTE — ED Notes (Signed)
Pt asked if she could use the restroom, pt stated she cant use it yet. Pt informed that if she could not use it we would have to in and out pt. Pt understood

## 2017-04-06 NOTE — ED Notes (Signed)
Ortho tech paged and called back.

## 2017-04-06 NOTE — ED Provider Notes (Signed)
[facility] Provider Note   CSN: [id number] Arrival date & time: 04/06/17  1056     History   Chief Complaint Chief Complaint  Patient presents with  . Hypotension    HPI Olivia Williamson is a 71 y.o. female.  Pt presents to the ED today with weakness and low blood pressure.  Pt speaks [place] and her son and daughter translate.  The pt said Olivia Williamson has not been feeling well for a few days.  Olivia Williamson fell last night and hurt her back and her left ankle.  The pt does have chronic lbp, but this fall made it worse.  The pt denies any cp/sob/abd pain.  No n/v.       Past Medical History:  Diagnosis Date  . Arthritis   . Diabetes mellitus without complication (HCC)    stopped DM meds 2004  . GERD (gastroesophageal reflux disease)   . Hypertension    meds stopped 1 week ago , need to get renewed  . Tuberculosis    ACTIVE    Patient Active Problem List   Diagnosis Date Noted  . Lumbar radiculopathy 02/02/2017  . Chronic left-sided thoracic back pain 02/02/2017  . 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 Multinodular goiter 11/01/2016  . Thyroid nodule 04/13/2016  . Abnormal CT of the chest   . Tuberculosis 01/09/2016  . Protein-calorie malnutrition, severe 12/16/2015  . Generalized weakness 12/15/2015  . GERD (gastroesophageal reflux disease)   . Diabetes mellitus with neurological manifestations [facility](HCC)     Past Surgical History:  Procedure Laterality Date  . ABDOMINAL HYSTERECTOMY    . COLONOSCOPY    . NO PAST SURGERIES      OB History    No data available       Home Medications    Prior to Admission medications   Medication Sig Start Date End Date Taking? Authorizing Provider  gabapentin (NEURONTIN) 300 MG capsule Take 2 capsules (600 mg total) by mouth 2 (two) times daily. 10/27/16  Yes Marcine MatarJohnson, Deborah B, MD  Insulin Glargine (LANTUS SOLOSTAR) 100 UNIT/ML Solostar Pen Inject 25 Units into the skin daily at 10 pm. 02/02/17  Yes  Marcine MatarJohnson, Deborah B, MD  metFORMIN (GLUCOPHAGE) 500 MG tablet Take 0.5 tablets (250 mg total) by mouth daily with breakfast. 02/02/17  Yes Marcine MatarJohnson, Deborah B, MD  Propylene Glycol (SYSTANE COMPLETE) 0.6 % SOLN Apply 1 drop daily as needed to eye (for dry eyes).   Yes [provider]  acetaminophen (TYLENOL) 500 MG tablet Take 1 tablet (500 mg total) by mouth 2 (two) times daily as needed for mild pain. Patient not taking: Reported on 04/06/2017 02/02/17   Marcine MatarJohnson, Deborah B, MD  calcium-vitamin D (OSCAL 500/200 D-3) 500-200 MG-UNIT tablet Take 1 tablet by mouth 2 (two) times daily. Patient not taking: Reported on 04/06/2017 02/02/17   Marcine MatarJohnson, Deborah B, MD  ferrous sulfate (FERROUSUL) 325 (65 FE) MG tablet Take 1 tablet (325 mg total) by mouth daily with breakfast. Patient not taking: Reported on 04/06/2017 10/27/16   Marcine MatarJohnson, Deborah B, MD  Insulin Pen Needle (BD PEN NEEDLE NANO U/F) 32G X 4 MM MISC USE TO INJECT INSULIN 01/22/17   Marcine MatarJohnson, Deborah B, MD    Family History Family History  Problem Relation Age of Onset  . Heart disease Maternal Uncle 3670    Social History Social History   Tobacco Use  . Smoking status: Never Smoker  . Smokeless tobacco: Never Used  Substance Use Topics  .  Alcohol use: No  . Drug use: No     Allergies   Beef-derived products; Eggs or egg-derived products; Fish allergy; and Fruit & vegetable daily [nutritional supplements]   Review of Systems Review of Systems  Musculoskeletal: Positive for back pain.       Left ankle pain  Neurological: Positive for weakness.  All other systems reviewed and are negative.    Physical Exam Updated Vital Signs BP (!) 154/67   Pulse 77   Temp 98.4 F (36.9 C) (Oral)   Resp 12   Ht 4\' 11"  (1.499 m)   Wt 42.2 kg (93 lb)   LMP 05/11/2016   SpO2 97%   BMI 18.78 kg/m   Physical Exam  Constitutional: Olivia Williamson is oriented to person, place, and time. Olivia Williamson appears well-developed and well-nourished.  HENT:    Head: Normocephalic and atraumatic.  Right Ear: External ear normal.  Left Ear: External ear normal.  Nose: Nose normal.  Mouth/Throat: Oropharynx is clear and moist.  Eyes: Conjunctivae and EOM are normal. Pupils are equal, round, and reactive to light.  Neck: Normal range of motion. Neck supple.  Cardiovascular: Normal rate, regular rhythm, normal heart sounds and intact distal pulses.  Pulmonary/Chest: Effort normal and breath sounds normal.  Abdominal: Soft. Bowel sounds are normal.  Musculoskeletal: Normal range of motion.       Left ankle: Tenderness. Lateral malleolus tenderness found.       Lumbar back: Olivia Williamson exhibits tenderness.  Neurological: Olivia Williamson is alert and oriented to person, place, and time.  Skin: Skin is warm and dry.  Psychiatric: Olivia Williamson has a normal mood and affect. Her behavior is normal. Judgment and thought content normal.  Nursing note and vitals reviewed.    ED Treatments / Results  Labs (all labs ordered are listed, but only abnormal results are displayed) Labs Reviewed  COMPREHENSIVE METABOLIC PANEL - Abnormal; Notable for the following components:      Result Value   Glucose, Bld 139 (*)    BUN 32 (*)    Creatinine, Ser 1.14 (*)    Alkaline Phosphatase 135 (*)    GFR calc non Af Amer 48 (*)    GFR calc Af Amer 56 (*)    All other components within normal limits  CBC WITH DIFFERENTIAL/PLATELET - Abnormal; Notable for the following components:   Hemoglobin 9.2 (*)    HCT 28.3 (*)    MCV 66.9 (*)    MCH 21.7 (*)    RDW 16.0 (*)    Platelets 102 (*)    All other components within normal limits  I-STAT CG4 LACTIC ACID, ED - Abnormal; Notable for the following components:   Lactic Acid, Venous 2.48 (*)    All other components within normal limits  URINALYSIS, ROUTINE W REFLEX MICROSCOPIC  I-STAT CG4 LACTIC ACID, ED  I-STAT CG4 LACTIC ACID, ED    EKG  EKG Interpretation  Date/Time:  Tuesday April 06 2017 11:14:15 EST Ventricular Rate:  67 PR  Interval:  150 QRS Duration: 72 QT Interval:  422 QTC Calculation: 445 R Axis:   20 Text Interpretation:  Normal sinus rhythm Septal infarct , age undetermined Abnormal ECG Confirmed by Jacalyn Lefevre [phone]) on 04/06/2017 11:50:43 AM Also confirmed by Jacalyn Lefevre [phone]), editor Barbette Hair [phone])  on 04/06/2017 12:30:29 PM       Radiology Dg Chest 2 View  Result Date: 04/06/2017 CLINICAL DATA:  Fall.  Back pain . EXAM: CHEST  2 VIEW COMPARISON:  MRI 02/10/2017 .  FINDINGS: Mediastinum hilar structures normal. Lungs are clear. No focal infiltrate. No pleural effusion or pneumothorax. Heart size normal. Thoracolumbar spine scoliosis and degenerative change. Stable lower thoracic/ upper lumbar compression fractures. IMPRESSION: No acute abnormality. Diffuse osteopenia degenerative change. Stable lower thoracic/ upper lumbar compression fractures . Electronically Signed   By: Maisie Fushomas  Register   On: 04/06/2017 13:33   Dg Lumbar Spine Complete  Result Date: 04/06/2017 CLINICAL DATA:  Fall yesterday. EXAM: LUMBAR SPINE - COMPLETE 4+ VIEW COMPARISON:  MRI 02/10/2017 . FINDINGS: Stable lower thoracic upper lumbar compression fractures. No acute abnormality P Diffuse osteopenia and degenerative change. Mild scoliosis concave left IMPRESSION: 1. No acute abnormality. Stable lower thoracic/ upper lumbar compression fractures. 2. Diffuse osteopenia and degenerative change.  Change . Electronically Signed   By: Maisie Fushomas  Register   On: 04/06/2017 13:35   Dg Ankle Complete Left  Result Date: 04/06/2017 CLINICAL DATA:  Fall, left ankle pain/swelling EXAM: LEFT ANKLE COMPLETE - 3+ VIEW COMPARISON:  None. FINDINGS: Nondisplaced oblique distal fibular fracture. The ankle mortise is intact. The base of the fifth metatarsal is unremarkable. Mild lateral soft tissue swelling. IMPRESSION: Nondisplaced oblique distal fibular fracture with mild overlying soft tissue swelling. Electronically Signed   By:  Charline BillsSriyesh  Krishnan M.D.   On: 04/06/2017 13:38    Procedures Procedures (including critical care time)  Medications Ordered in ED Medications  sodium chloride 0.9 % bolus 1,000 mL (0 mLs Intravenous Stopped 04/06/17 1533)  sodium chloride 0.9 % bolus 1,000 mL (1,000 mLs Intravenous New Bag/Given 04/06/17 1557)     Initial Impression / Assessment and Plan / ED Course  I have reviewed the triage vital signs and the nursing notes.  Pertinent labs & imaging results that were available during my care of the patient were reviewed by me and considered in my medical decision making (see chart for details).  Pt placed in a CAM walker for her ankle.  Olivia Williamson is feeling much better after IVFs.  Lactic acid has also improved after IVFs.  Pt's urine is pending at shift change.  Pt to be signed out to Dr. Verdie MosherLiu.  I anticipate pt can be d/c.  Family updated on plan.  Final Clinical Impressions(s) / ED Diagnoses   Final diagnoses:  Dehydration  Other closed fracture of distal end of left fibula, initial encounter  Orthostatic hypotension    ED Discharge Orders    None       Jacalyn LefevreHaviland, Curly Mackowski, MD 04/06/17 [phone]

## 2017-04-06 NOTE — ED Triage Notes (Signed)
[facility]. [facility] weakness, fall yesterday and hypotension.  Pt. 's daughter and son-inlaw are with her.  They interrupt for her,. Pt. Fell and hurt her back and lt. Ankle and foot. Skin is pale, warm and dry.  Pt. Has a HX of TB. Pt. Has pain in lt. Ankle and lt. Foot.  Mild swelling to her lt. Ankle, no deformity noted.,

## 2017-04-07 ENCOUNTER — Other Ambulatory Visit: Payer: Self-pay | Admitting: Internal Medicine

## 2017-04-07 DIAGNOSIS — E042 Nontoxic multinodular goiter: Secondary | ICD-10-CM

## 2017-04-11 ENCOUNTER — Telehealth: Payer: Self-pay | Admitting: Internal Medicine

## 2017-04-11 NOTE — Telephone Encounter (Signed)
-----   Message from Dionne BucyNora E Soler sent at 04/09/2017 12:01 PM EST ----- I'm sorry   Sent referral today   to [facility] ph. # [phone] .They will contact the patient to schedule an appointment. ----- Message ----- From: Marcine MatarJohnson, Deborah B, MD Sent: 04/04/2017   3:32 PM To: Dionne BucyNora E Soler  Any movement of Neurosurgery referral?

## 2017-04-13 ENCOUNTER — Telehealth: Payer: Self-pay | Admitting: Internal Medicine

## 2017-04-13 MED ORDER — INSULIN PEN NEEDLE 32G X 4 MM MISC: 2 refills | Status: DC

## 2017-04-13 NOTE — Telephone Encounter (Signed)
Refilled

## 2017-04-13 NOTE — Telephone Encounter (Signed)
Pt called to request a refill for Insulin Pen Needle (BD PEN NEEDLE NANO U/F) 32G X 4 MM MISC  Please follow up

## 2017-04-21 ENCOUNTER — Telehealth: Payer: Self-pay | Admitting: Internal Medicine

## 2017-04-21 DIAGNOSIS — M5416 Radiculopathy, lumbar region: Secondary | ICD-10-CM

## 2017-04-21 NOTE — Telephone Encounter (Signed)
Will submit referral to neurosurgery for lumbar radiculopathy.

## 2017-04-21 NOTE — Telephone Encounter (Signed)
-----   Message from Dionne BucyNora E Soler sent at 04/20/2017  3:17 PM EST ----- Regarding: FW: referral Pain Clinic  Good Afternoon Please, see the message below  Thank You  ----- Message ----- From: Jens SomKellner, Lisa L Sent: 04/19/2017   3:40 PM To: Dionne BucyNora E Soler Subject: referral                                       Dr. Laural BenesJohnson- Dr. Wynn BankerKirsteins has asked that this patient have a neurosurgery evaluation prior to his evaluation please.

## 2017-05-03 ENCOUNTER — Inpatient Hospital Stay: Payer: Self-pay | Admitting: Internal Medicine

## 2017-06-01 ENCOUNTER — Ambulatory Visit: Payer: Medicare Other | Attending: Internal Medicine | Admitting: Internal Medicine

## 2017-06-01 ENCOUNTER — Encounter: Payer: Self-pay | Admitting: Internal Medicine

## 2017-06-01 VITALS — BP 70/40 | HR 77 | Temp 97.6°F | Resp 16 | Wt 94.0 lb

## 2017-06-01 DIAGNOSIS — Z760 Encounter for issue of repeat prescription: Secondary | ICD-10-CM | POA: Diagnosis not present

## 2017-06-01 DIAGNOSIS — D649 Anemia, unspecified: Secondary | ICD-10-CM | POA: Diagnosis not present

## 2017-06-01 DIAGNOSIS — E1140 Type 2 diabetes mellitus with diabetic neuropathy, unspecified: Secondary | ICD-10-CM | POA: Insufficient documentation

## 2017-06-01 DIAGNOSIS — E042 Nontoxic multinodular goiter: Secondary | ICD-10-CM | POA: Insufficient documentation

## 2017-06-01 DIAGNOSIS — Z794 Long term (current) use of insulin: Secondary | ICD-10-CM | POA: Diagnosis not present

## 2017-06-01 DIAGNOSIS — S82832D Other fracture of upper and lower end of left fibula, subsequent encounter for closed fracture with routine healing: Secondary | ICD-10-CM

## 2017-06-01 DIAGNOSIS — E118 Type 2 diabetes mellitus with unspecified complications: Secondary | ICD-10-CM | POA: Diagnosis not present

## 2017-06-01 DIAGNOSIS — IMO0002 Reserved for concepts with insufficient information to code with codable children: Secondary | ICD-10-CM

## 2017-06-01 DIAGNOSIS — E1165 Type 2 diabetes mellitus with hyperglycemia: Secondary | ICD-10-CM

## 2017-06-01 DIAGNOSIS — M8088XD Other osteoporosis with current pathological fracture, vertebra(e), subsequent encounter for fracture with routine healing: Secondary | ICD-10-CM | POA: Diagnosis not present

## 2017-06-01 DIAGNOSIS — I959 Hypotension, unspecified: Secondary | ICD-10-CM | POA: Diagnosis present

## 2017-06-01 DIAGNOSIS — K219 Gastro-esophageal reflux disease without esophagitis: Secondary | ICD-10-CM | POA: Insufficient documentation

## 2017-06-01 DIAGNOSIS — E1142 Type 2 diabetes mellitus with diabetic polyneuropathy: Secondary | ICD-10-CM

## 2017-06-01 DIAGNOSIS — E041 Nontoxic single thyroid nodule: Secondary | ICD-10-CM

## 2017-06-01 DIAGNOSIS — M8008XD Age-related osteoporosis with current pathological fracture, vertebra(e), subsequent encounter for fracture with routine healing: Secondary | ICD-10-CM

## 2017-06-01 LAB — GLUCOSE, POCT (MANUAL RESULT ENTRY): POC GLUCOSE: 167 mg/dL — AB (ref 70–99)

## 2017-06-01 LAB — POCT GLYCOSYLATED HEMOGLOBIN (HGB A1C): Hemoglobin A1C: 9.3

## 2017-06-01 MED ORDER — CALCIUM 600 MG PO TABS: 600.0000 mg | ORAL_TABLET | Freq: Two times a day (BID) | ORAL | 6 refills | Status: DC

## 2017-06-01 MED ORDER — ALENDRONATE SODIUM 70 MG PO TABS: 70.0000 mg | ORAL_TABLET | ORAL | 6 refills | Status: DC

## 2017-06-01 MED ORDER — INSULIN GLARGINE 100 UNIT/ML SOLOSTAR PEN: 28.0000 [IU] | PEN_INJECTOR | Freq: Every day | SUBCUTANEOUS | 5 refills | Status: DC

## 2017-06-01 MED ORDER — VITAMIN D (CHOLECALCIFEROL) 10 MCG (400 UNIT) PO CAPS: 800.0000 [IU] | ORAL_CAPSULE | Freq: Every day | ORAL | 6 refills | Status: DC

## 2017-06-01 MED ORDER — METFORMIN HCL 500 MG PO TABS: 250.0000 mg | ORAL_TABLET | Freq: Every day | ORAL | 3 refills | Status: DC

## 2017-06-01 NOTE — Patient Instructions (Signed)
Increase Lantus to 28 units daily. Restart Metformin.  Take Calcium and Vitamin D daily.

## 2017-06-01 NOTE — Progress Notes (Signed)
Patient ID: Olivia Williamson, female    DOB: 1946/04/19  MRN: [id number]  CC: Hospitalization Follow-up   Subjective: Olivia Williamson is a 72 y.o. female who presents for chronic ds management. Last seen 03/2017. Son-in-law, [place] and interpreter, Elenor Legato from [place], are with her. 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  On last visit patient was sent to the emergency room because of dehydration with low blood pressure and reports of a fall with injury to the left foot.   -In ER she was found to be dehydrated and was given IV fluids.  X-ray of the left ankle revealed nondisplaced fracture of the distal fibula.  She was given a cam boot which she still wears outside of the home.  At home she does not wear it. -Son-in-law reports that she saw a specialist for the ankle since ER visit and she was told to continue wearing the cam boot.  He does not remember the name of the specialist or the practice but will bring that information back for me later this week. -No falls or dizziness since last visit. Feels very tired Good appetite  2. Has appt this Friday with Endocrinologist for follow-up of thyroid nodule.  3. She has not seen neuro surgeon  as yet for her back  4. DM:  Reports compliance with Lantus 25 units.  Does not have Metformin Checks BS QOD. Range 200-300   Patient Active Problem List   Diagnosis Date Noted  . Lumbar radiculopathy 02/02/2017  . Chronic left-sided thoracic back pain 02/02/2017  . 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 Multinodular goiter 11/01/2016  . Thyroid nodule 04/13/2016  . Abnormal CT of the chest   . Tuberculosis 01/09/2016  . Protein-calorie malnutrition, severe 12/16/2015  . Generalized weakness 12/15/2015  . GERD (gastroesophageal reflux disease)   . Diabetes mellitus with neurological manifestations  [facility])      Current Outpatient Medications on File Prior to Visit  Medication Sig Dispense Refill  . acetaminophen (TYLENOL) 500 MG tablet Take 1 tablet (500 mg total) by mouth 2 (two) times daily as needed for mild pain. (Patient not taking: Reported on 04/06/2017) 100 tablet 0  . ferrous sulfate (FERROUSUL) 325 (65 FE) MG tablet Take 1 tablet (325 mg total) by mouth daily with breakfast. (Patient not taking: Reported on 04/06/2017) 100 tablet 1  . gabapentin (NEURONTIN) 300 MG capsule Take 2 capsules (600 mg total) by mouth 2 (two) times daily. 120 capsule 6  . Insulin Pen Needle (BD PEN NEEDLE NANO U/F) 32G X 4 MM MISC USE TO INJECT INSULIN 100 each 2  . Propylene Glycol (SYSTANE COMPLETE) 0.6 % SOLN Apply 1 drop daily as needed to eye (for dry eyes).     No current facility-administered medications on file prior to visit.     Allergies  Allergen Reactions  . Beef-Derived Products Other (See Comments)    Generalized burning sensation  . Eggs Or Egg-Derived Products Other (See Comments)    Generalized burning sensation  . Fish Allergy Other (See Comments)    Generalized burning sensation  . Fruit & Vegetable Daily [Nutritional Supplements] Other (See Comments)    Orange causes lower extremity burning    Social History   Socioeconomic History  . Marital status: Widowed    Spouse name: Not on file  . Number of children: Not on  file  . Years of education: Not on file  . Highest education level: Not on file  Social Needs  . Financial resource strain: Not on file  . Food insecurity - worry: Not on file  . Food insecurity - inability: Not on file  . Transportation needs - medical: Not on file  . Transportation needs - non-medical: Not on file  Occupational History  . Not on file  Tobacco Use  . Smoking status: Never Smoker  . Smokeless tobacco: Never Used  Substance and Sexual Activity  . Alcohol use: No  . Drug use: No  . Sexual activity: Not on file  Other Topics Concern   . Not on file  Social History Narrative  . Not on file    Family History  Problem Relation Age of Onset  . Heart disease Maternal Uncle 26    Past Surgical History:  Procedure Laterality Date  . ABDOMINAL HYSTERECTOMY    . CHOLECYSTECTOMY N/A 05/02/2014   Procedure: LAPAROSCOPIC CHOLECYSTECTOMY ;  Surgeon: Abigail Miyamoto, MD;  Location: [facility] OR;  Service: General;  Laterality: N/A;  laparoscopic cholecystectomy  . COLONOSCOPY    . NO PAST SURGERIES    . VIDEO BRONCHOSCOPY Bilateral 01/10/2016   Procedure: VIDEO BRONCHOSCOPY WITHOUT FLUORO;  Surgeon: Leslye Peer, MD;  Location: [facility] ENDOSCOPY;  Service: Cardiopulmonary;  Laterality: Bilateral;    ROS: Review of Systems As stated above. PHYSICAL EXAM: BP (!) 70/40 (BP Location: Right Arm, Cuff Size: Small)   Pulse 77   Temp 97.6 F (36.4 C) (Oral)   Resp 16   Wt 94 lb (42.6 kg)   LMP 05/11/2016   SpO2 96%   BMI 18.99 kg/m   Repeat BP 70/40 Wt Readings from Last 3 Encounters:  06/01/17 94 lb (42.6 kg)  04/06/17 93 lb (42.2 kg)  04/06/17 93 lb (42.2 kg)   Physical Exam  General appearance -frail thin elderly female who appears chronically ill. Mental status -she is oriented to person and place. Mouth -oral mucosa moist. Neck -no cervical lymphadenopathy.  Thyroid nodule on the left appears unchanged. Chest -few scattered crackles heard in the right lungs that cleared with repeat deep inspiration Heart - normal rate, regular rhythm, normal S1, S2, no murmurs, rubs, clicks or gallops Musculoskeletal -left ankle: Cam boot removed.  No soft tissue swelling noted.  Mild tenderness on palpation over the distal fibula.  Slow range of motion of the ankle. Extremities -no lower extremity edema.  BS 167 A1C 9.3 ASSESSMENT AND PLAN: 1. Hypotension, unspecified hypotension type -RN not here today for me to give IV fluids so I recommended patient be seen in the emergency room but she declined.  I recommend pushing oral fluids  at home  2. Diabetes mellitus type 2, uncontrolled, with complications (HCC) Not at goal.  Increase Lantus to 28 units.  Refill metformin - POCT glucose (manual entry) - POCT glycosylated hemoglobin (Hb A1C) - metFORMIN (GLUCOPHAGE) 500 MG tablet; Take 0.5 tablets (250 mg total) by mouth daily with breakfast.  Dispense: 45 tablet; Refill: 3 - Insulin Glargine (LANTUS SOLOSTAR) 100 UNIT/ML Solostar Pen; Inject 28 Units into the skin daily at 10 pm.  Dispense: 5 pen; Refill: 5  3. Closed fracture of distal end of left fibula with routine healing, unspecified fracture morphology, subsequent encounter -Son-in-law to bring me the information of the orthopedics at home she had seen and we can request records  4. 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  Prescription sent again to her pharmacy and I stressed the importance of taking. -Start Fosamax.  I went over her how to take it - calcium carbonate (OS-CAL) 600 MG tablet; Take 1 tablet (600 mg total) by mouth 2 (two) times daily with a meal.  Dispense: 60 tablet; Refill: 6 - Vitamin D, Cholecalciferol, 400 units CAPS; Take 800 Units by mouth daily.  Dispense: 120 capsule; Refill: 6 - alendronate (FOSAMAX) 70 MG tablet; Take 1 tablet (70 mg total) by mouth once a week. Take with a full glass of water on an empty stomach.  Dispense: 12 tablet; Refill: 6  5. Thyroid nodule Keep follow-up appointment with endocrinologist - Comprehensive metabolic panel  Patient was given the opportunity to ask questions.  Patient verbalized understanding of the plan and was able to repeat key elements of the plan.   Orders Placed This Encounter  Procedures  . Comprehensive metabolic panel  . POCT glucose (manual entry)  . POCT glycosylated hemoglobin (Hb A1C)     Requested Prescriptions   Signed Prescriptions Disp Refills    . calcium carbonate (OS-CAL) 600 MG tablet 60 tablet 6    Sig: Take 1 tablet (600 mg total) by mouth 2 (two) times daily with a meal.  . Vitamin D, Cholecalciferol, 400 units CAPS 120 capsule 6    Sig: Take 800 Units by mouth daily.  . metFORMIN (GLUCOPHAGE) 500 MG tablet 45 tablet 3    Sig: Take 0.5 tablets (250 mg total) by mouth daily with breakfast.  . Insulin Glargine (LANTUS SOLOSTAR) 100 UNIT/ML Solostar Pen 5 pen 5    Sig: Inject 28 Units into the skin daily at 10 pm.  . alendronate (FOSAMAX) 70 MG tablet 12 tablet 6    Sig: Take 1 tablet (70 mg total) by mouth once a week. Take with a full glass of water on an empty stomach.    Return in about 3 weeks (around 06/22/2017).  Jonah Blueeborah Hadleigh Felber, MD, FACP

## 2017-06-02 LAB — COMPREHENSIVE METABOLIC PANEL
ALT: 21 IU/L (ref 0–32)
AST: 19 IU/L (ref 0–40)
Albumin/Globulin Ratio: 1.1 — ABNORMAL LOW (ref 1.2–2.2)
Albumin: 3.9 g/dL (ref 3.6–4.8)
Alkaline Phosphatase: 126 IU/L — ABNORMAL HIGH (ref 39–117)
BUN/Creatinine Ratio: 18 (ref 12–28)
BUN: 21 mg/dL (ref 8–27)
Bilirubin Total: 0.7 mg/dL (ref 0.0–1.2)
CALCIUM: 9.4 mg/dL (ref 8.7–10.3)
CO2: 24 mmol/L (ref 20–29)
CREATININE: 1.17 mg/dL — AB (ref 0.57–1.00)
Chloride: 97 mmol/L (ref 96–106)
GFR calc Af Amer: 55 mL/min/{1.73_m2} — ABNORMAL LOW (ref >59)
GFR, EST NON AFRICAN AMERICAN: 48 mL/min/{1.73_m2} — AB (ref >59)
Globulin, Total: 3.4 g/dL (ref 1.5–4.5)
Glucose: 141 mg/dL — ABNORMAL HIGH (ref 65–99)
Potassium: 4.5 mmol/L (ref 3.5–5.2)
Sodium: 138 mmol/L (ref 134–144)
TOTAL PROTEIN: 7.3 g/dL (ref 6.0–8.5)

## 2017-06-02 NOTE — Progress Notes (Signed)
Results for orders placed or performed in visit on 06/01/17  Comprehensive metabolic panel  Result Value Ref Range   Glucose 141 (H) 65 - 99 mg/dL   BUN 21 8 - 27 mg/dL   Creatinine, Ser 1.17 (H) 0.57 - 1.00 mg/dL   GFR calc non Af Amer 48 (L) >59 mL/min/1.73   GFR calc Af Amer 55 (L) >59 mL/min/1.73   BUN/Creatinine Ratio 18 12 - 28   Sodium 138 134 - 144 mmol/L   Potassium 4.5 3.5 - 5.2 mmol/L   Chloride 97 96 - 106 mmol/L   CO2 24 20 - 29 mmol/L   Calcium 9.4 8.7 - 10.3 mg/dL   Total Protein 7.3 6.0 - 8.5 g/dL   Albumin 3.9 3.6 - 4.8 g/dL   Globulin, Total 3.4 1.5 - 4.5 g/dL   Albumin/Globulin Ratio 1.1 (L) 1.2 - 2.2   Bilirubin Total 0.7 0.0 - 1.2 mg/dL   Alkaline Phosphatase 126 (H) 39 - 117 IU/L   AST 19 0 - 40 IU/L   ALT 21 0 - 32 IU/L  POCT glucose (manual entry)  Result Value Ref Range   POC Glucose 167 (A) 70 - 99 mg/dl  POCT glycosylated hemoglobin (Hb A1C)  Result Value Ref Range   Hemoglobin A1C 9.3    eGFR still reduce in range for CKD stage 3. Also still slightly prerenal.

## 2017-06-04 ENCOUNTER — Other Ambulatory Visit: Payer: Self-pay | Admitting: Student

## 2017-06-04 ENCOUNTER — Inpatient Hospital Stay: Admission: RE | Admit: 2017-06-04 | Payer: Self-pay | Source: Ambulatory Visit

## 2017-06-29 ENCOUNTER — Ambulatory Visit: Payer: Medicaid Other | Attending: Internal Medicine | Admitting: Internal Medicine

## 2017-06-29 ENCOUNTER — Encounter: Payer: Self-pay | Admitting: Internal Medicine

## 2017-06-29 VITALS — BP 152/84 | HR 79 | Temp 98.5°F | Resp 16 | Wt 93.8 lb

## 2017-06-29 DIAGNOSIS — M810 Age-related osteoporosis without current pathological fracture: Secondary | ICD-10-CM | POA: Diagnosis not present

## 2017-06-29 DIAGNOSIS — K219 Gastro-esophageal reflux disease without esophagitis: Secondary | ICD-10-CM | POA: Insufficient documentation

## 2017-06-29 DIAGNOSIS — M5416 Radiculopathy, lumbar region: Secondary | ICD-10-CM | POA: Diagnosis not present

## 2017-06-29 DIAGNOSIS — E43 Unspecified severe protein-calorie malnutrition: Secondary | ICD-10-CM | POA: Insufficient documentation

## 2017-06-29 DIAGNOSIS — Z794 Long term (current) use of insulin: Secondary | ICD-10-CM | POA: Insufficient documentation

## 2017-06-29 DIAGNOSIS — A159 Respiratory tuberculosis unspecified: Secondary | ICD-10-CM | POA: Insufficient documentation

## 2017-06-29 DIAGNOSIS — E042 Nontoxic multinodular goiter: Secondary | ICD-10-CM | POA: Insufficient documentation

## 2017-06-29 DIAGNOSIS — E041 Nontoxic single thyroid nodule: Secondary | ICD-10-CM

## 2017-06-29 DIAGNOSIS — R197 Diarrhea, unspecified: Secondary | ICD-10-CM | POA: Insufficient documentation

## 2017-06-29 DIAGNOSIS — E1165 Type 2 diabetes mellitus with hyperglycemia: Secondary | ICD-10-CM | POA: Diagnosis not present

## 2017-06-29 DIAGNOSIS — Z79899 Other long term (current) drug therapy: Secondary | ICD-10-CM | POA: Diagnosis not present

## 2017-06-29 DIAGNOSIS — M8448XD Pathological fracture, other site, subsequent encounter for fracture with routine healing: Secondary | ICD-10-CM | POA: Diagnosis not present

## 2017-06-29 DIAGNOSIS — M546 Pain in thoracic spine: Secondary | ICD-10-CM | POA: Diagnosis not present

## 2017-06-29 DIAGNOSIS — IMO0002 Reserved for concepts with insufficient information to code with codable children: Secondary | ICD-10-CM

## 2017-06-29 DIAGNOSIS — E118 Type 2 diabetes mellitus with unspecified complications: Secondary | ICD-10-CM

## 2017-06-29 DIAGNOSIS — D649 Anemia, unspecified: Secondary | ICD-10-CM | POA: Diagnosis present

## 2017-06-29 DIAGNOSIS — Z9049 Acquired absence of other specified parts of digestive tract: Secondary | ICD-10-CM | POA: Insufficient documentation

## 2017-06-29 DIAGNOSIS — E1140 Type 2 diabetes mellitus with diabetic neuropathy, unspecified: Secondary | ICD-10-CM | POA: Insufficient documentation

## 2017-06-29 DIAGNOSIS — M8008XD Age-related osteoporosis with current pathological fracture, vertebra(e), subsequent encounter for fracture with routine healing: Secondary | ICD-10-CM | POA: Diagnosis not present

## 2017-06-29 DIAGNOSIS — E1149 Type 2 diabetes mellitus with other diabetic neurological complication: Secondary | ICD-10-CM | POA: Insufficient documentation

## 2017-06-29 LAB — POCT URINALYSIS DIPSTICK
Bilirubin, UA: NEGATIVE
Ketones, UA: NEGATIVE
LEUKOCYTES UA: NEGATIVE
Nitrite, UA: POSITIVE
Protein, UA: NEGATIVE
Urobilinogen, UA: 0.2 E.U./dL
pH, UA: 5.5 (ref 5.0–8.0)

## 2017-06-29 LAB — GLUCOSE, POCT (MANUAL RESULT ENTRY)
POC GLUCOSE: 484 mg/dL — AB (ref 70–99)
POC GLUCOSE: 542 mg/dL — AB (ref 70–99)

## 2017-06-29 MED ORDER — INSULIN ASPART 100 UNIT/ML FLEXPEN: PEN_INJECTOR | SUBCUTANEOUS | 11 refills | Status: DC

## 2017-06-29 MED ORDER — INSULIN ASPART 100 UNIT/ML ~~LOC~~ SOLN
10.0000 [IU] | Freq: Once | SUBCUTANEOUS | Status: AC
  Administered 2017-06-29: 10 [IU] via SUBCUTANEOUS

## 2017-06-29 MED ORDER — VITAMIN D (CHOLECALCIFEROL) 10 MCG (400 UNIT) PO CAPS: 800.0000 [IU] | ORAL_CAPSULE | Freq: Every day | ORAL | 6 refills | Status: DC

## 2017-06-29 NOTE — Progress Notes (Signed)
Patient ID: Olivia Williamson, female    DOB: 05-17-1946  MRN: [id number]  CC: Follow-up (3 week) and Back Pain   Subjective: Olivia Williamson is a 72 y.o. female who presents for 3 wk f/u.  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  1.  Osteoporosis:  Started on Fosamax on last visit.  She has bottle with her.  She is taking and tolerating OK.  Did get the Ca+ supplement  but did no get Vit D  2.  Cancel appt with neurosurgeon.  She was not feeling well the day of the appt.  Did not reschedule Back pain a little better  3. Thyroid nodule:  Reportedly saw endocrinologist since last visit.  Plan is to monitor her per daughter.  I have not received any notes from endocrinologist  4.  DM: checks BS every morning.  She does not have log with her -out of Lantus x 1 wk. Visited a relative for a wk and did not take Lantus with her.  Metformin caused diarrhea so she stopped taking 1 wk ago.   Patient Active Problem List   Diagnosis Date Noted  . Lumbar radiculopathy 02/02/2017  . Chronic left-sided thoracic back pain 02/02/2017  . 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 Multinodular goiter 11/01/2016  . Thyroid nodule 04/13/2016  . Abnormal CT of the chest   . Tuberculosis 01/09/2016  . Protein-calorie malnutrition, severe 12/16/2015  . Generalized weakness 12/15/2015  . GERD (gastroesophageal reflux disease)   . Diabetes mellitus with neurological manifestations [facility]      Current Outpatient Medications on File Prior to Visit  Medication Sig Dispense Refill  . acetaminophen (TYLENOL) 500 MG tablet Take 1 tablet (500 mg total) by mouth 2 (two) times daily as needed for mild pain. (Patient not taking: Reported on  04/06/2017) 100 tablet 0  . alendronate (FOSAMAX) 70 MG tablet Take 1 tablet (70 mg total) by mouth once a week. Take with a full glass of water on an empty stomach. 12 tablet 6  . calcium carbonate (OS-CAL) 600 MG tablet Take 1 tablet (600 mg total) by mouth 2 (two) times daily with a meal. 60 tablet 6  . ferrous sulfate (FERROUSUL) 325 (65 FE) MG tablet Take 1 tablet (325 mg total) by mouth daily with breakfast. (Patient not taking: Reported on 04/06/2017) 100 tablet 1  . gabapentin (NEURONTIN) 300 MG capsule Take 2 capsules (600 mg total) by mouth 2 (two) times daily. 120 capsule 6  . Insulin Glargine (LANTUS SOLOSTAR) 100 UNIT/ML Solostar Pen Inject 28 Units into the skin daily at 10 pm. 5 pen 5  . Insulin Pen Needle (BD PEN NEEDLE NANO U/F) 32G X 4 MM MISC USE TO INJECT INSULIN 100 each 2  . metFORMIN (GLUCOPHAGE) 500 MG tablet Take 0.5 tablets (250 mg total) by mouth daily with breakfast. 45 tablet 3  . Propylene Glycol (SYSTANE COMPLETE) 0.6 % SOLN Apply 1 drop daily as needed to eye (for dry eyes).    . Vitamin D, Cholecalciferol, 400 units CAPS Take 800 Units by mouth daily. 120 capsule 6   No current facility-administered medications on file prior to visit.     Allergies  Allergen Reactions  . Beef-Derived Products Other (  See Comments)    Generalized burning sensation  . Eggs Or Egg-Derived Products Other (See Comments)    Generalized burning sensation  . Fish Allergy Other (See Comments)    Generalized burning sensation  . Fruit & Vegetable Daily [Nutritional Supplements] Other (See Comments)    Orange causes lower extremity burning    Social History   Socioeconomic History  . Marital status: Widowed    Spouse name: Not on file  . Number of children: Not on file  . Years of education: Not on file  . Highest education level: Not on file  Social Needs  . Financial resource strain: Not on file  . Food insecurity - worry: Not on file  . Food insecurity - inability: Not on  file  . Transportation needs - medical: Not on file  . Transportation needs - non-medical: Not on file  Occupational History  . Not on file  Tobacco Use  . Smoking status: Never Smoker  . Smokeless tobacco: Never Used  Substance and Sexual Activity  . Alcohol use: No  . Drug use: No  . Sexual activity: Not on file  Other Topics Concern  . Not on file  Social History Narrative  . Not on file    Family History  Problem Relation Age of Onset  . Heart disease Maternal Uncle [id number]    Past Surgical History:  Procedure Laterality Date  . ABDOMINAL HYSTERECTOMY    . CHOLECYSTECTOMY N/A 05/02/2014   Procedure: LAPAROSCOPIC CHOLECYSTECTOMY ;  Surgeon: Abigail Miyamotoouglas Blackman, MD;  [facility];  Service: General;  Laterality: N/A;  laparoscopic cholecystectomy  . COLONOSCOPY    . NO PAST SURGERIES    . VIDEO BRONCHOSCOPY Bilateral 01/10/2016   Procedure: VIDEO BRONCHOSCOPY WITHOUT FLUORO;  Surgeon: Leslye Peerobert S Byrum, MD;  [facility];  Service: Cardiopulmonary;  Laterality: Bilateral;    ROS: Review of Systems Neg except as stated above PHYSICAL EXAM: BP (!) 152/84   Pulse 79   Temp 98.5 F (36.9 C) (Oral)   Resp 16   Wt 93 lb 12.8 oz (42.5 kg)   LMP 05/11/2016   SpO2 98%   BMI 18.95 kg/m   Wt Readings from Last 3 Encounters:  06/29/17 93 lb 12.8 oz (42.5 kg)  06/01/17 94 lb (42.6 kg)  04/06/17 93 lb (42.2 kg)    Physical Exam  General appearance - frail elderly female in NAD.  She is more interactive today Mental status - oriented to person and place Mouth - moist oral mucosa Neck - supple, no significant adenopathy Chest - clear to auscultation, no wheezes, rales or rhonchi, symmetric air entry Heart - normal rate, regular rhythm, normal S1, S2, no murmurs, rubs, clicks or gallops Extremities - no LE edema.  Ambulates with a cane  Results for orders placed or performed in visit on 06/01/17  Comprehensive metabolic panel  Result Value Ref Range   Glucose 141  (H) 65 - 99 mg/dL   BUN 21 8 - 27 mg/dL   Creatinine, Ser 1.611.17 (H) 0.57 - 1.00 mg/dL   GFR calc non Af Amer 48 (L) >59 mL/min/1.73   GFR calc Af Amer 55 (L) >59 mL/min/1.73   BUN/Creatinine Ratio 18 12 - 28   Sodium 138 134 - 144 mmol/L   Potassium 4.5 3.5 - 5.2 mmol/L   Chloride 97 96 - 106 mmol/L   CO2 24 20 - 29 mmol/L   Calcium 9.4 8.7 - 10.3 mg/dL   Total Protein 7.3 6.0 -  8.5 g/dL   Albumin 3.9 3.6 - 4.8 g/dL   Globulin, Total 3.4 1.5 - 4.5 g/dL   Albumin/Globulin Ratio 1.1 (L) 1.2 - 2.2   Bilirubin Total 0.7 0.0 - 1.2 mg/dL   Alkaline Phosphatase 126 (H) 39 - 117 IU/L   AST 19 0 - 40 IU/L   ALT 21 0 - 32 IU/L  POCT glucose (manual entry)  Result Value Ref Range   POC Glucose 167 (A) 70 - 99 mg/dl  POCT glycosylated hemoglobin (Hb A1C)  Result Value Ref Range   Hemoglobin A1C 9.3    Results for orders placed or performed in visit on 06/29/17  POCT glucose (manual entry)  Result Value Ref Range   POC Glucose 542 (A) 70 - 99 mg/dl  POCT glucose (manual entry)  Result Value Ref Range   POC Glucose 484 (A) 70 - 99 mg/dl  POCT urinalysis dipstick  Result Value Ref Range   Color, UA yellow    Clarity, UA clear    Glucose, UA >1,000    Bilirubin, UA negative    Ketones, UA negative    Spec Grav, UA <=1.005 (A) 1.010 - 1.025   Blood, UA trace    pH, UA 5.5 5.0 - 8.0   Protein, UA negative    Urobilinogen, UA 0.2 0.2 or 1.0 E.U./dL   Nitrite, UA positive    Leukocytes, UA Negative Negative   Appearance     Odor      ASSESSMENT AND PLAN: 1. Pathological fracture of vertebra due to osteoporosis with routine healing, unspecified osteoporosis type, subsequent encounter -continue Fosamax and Calcium.  Advised to get the Vit D supplement as ordered - Vitamin D, Cholecalciferol, 400 units CAPS; Take 800 Units by mouth daily.  Dispense: 120 capsule; Refill: 6  2. Diabetes mellitus type 2, uncontrolled, with complications (HCC) -Given 10 units of Novolog and some water  here today. -stop Metformin due to GI upset Start Novolog 3 units twice a day with meals Continue Lantus I have asked that she return for short term f/u with clinical pharmacist and bring her log book with BS readings which she forgot to bring today so that insulin can be titrated based on readings - POCT glucose (manual entry) - insulin aspart (novoLOG) injection 10 Units - insulin aspart (NOVOLOG FLEXPEN) 100 UNIT/ML FlexPen; 3 units with breakfast and dinner  Dispense: 15 mL; Refill: 11 - POCT glucose (manual entry) - POCT urinalysis dipstick  3. Thyroid nodule -will have my CMA contact [facility] to get copy of notes from endocrinologist  4. Lumbar radiculopathy Will resubmit referral to neurosurgeon/ortho.  Encouraged to keep appt.  Patient was given the opportunity to ask questions.  Patient verbalized understanding of the plan and was able to repeat key elements of the plan.   No orders of the defined types were placed in this encounter.    Requested Prescriptions    No prescriptions requested or ordered in this encounter    No Follow-up on file.  Jonah Blue, MD, FACP

## 2017-06-29 NOTE — Patient Instructions (Addendum)
[facility].

## 2017-07-01 ENCOUNTER — Encounter: Payer: Self-pay | Admitting: Pharmacist

## 2017-07-01 NOTE — Progress Notes (Deleted)
    S:     No chief complaint on file.   Patient arrives ***.  Presents for diabetes evaluation, education, and management at the request of Dr. Laural BenesJohnson. Patient was referred on 06/29/17.  Patient was last seen by Primary Care Provider on 06/29/17.   Patient reports Diabetes was diagnosed in ***.   Family/Social History:   Patient {Actions; denies-reports:120008} adherence with medications.  Current diabetes medications include: Lantus 28 units daily, Novolog 3 units with breakfast and dinner. Was on metformin but this was stopped due to GI upset.   Patient {Actions; denies-reports:120008} hypoglycemic events.  Patient reported dietary habits: Eats *** meals/day Breakfast:*** Lunch:*** Dinner:*** Snacks:*** Drinks:***  Patient reported exercise habits:    Patient {Actions; denies-reports:120008} nocturia.  Patient {Actions; denies-reports:120008} neuropathy. Patient {Actions; denies-reports:120008} visual changes. Patient {Actions; denies-reports:120008} self foot exams.    O:  Physical Exam   ROS   Lab Results  Component Value Date   HGBA1C 9.3 06/01/2017   There were no vitals filed for this visit.  Home fasting CBG: ***  2 hour post-prandial/random CBG: ***.  10 year ASCVD risk: ***.  A/P: Diabetes longstanding currently uncontrolled based on A1c of 9.3. Patient {Actions; denies-reports:120008} hypoglycemic events and is able to verbalize appropriate hypoglycemia management plan. Patient {Actions; denies-reports:120008} adherence with medication. Control is suboptimal due to dietary indiscretion and sedentary lifestyle.  Next A1C anticipated April 2019.    Written patient instructions provided.  Total time in face to face counseling *** minutes.   Follow up in Pharmacist Clinic Visit ***.   Patient seen with Enid DerryVictoria Mitchell, PharmD Candidate

## 2017-07-14 ENCOUNTER — Encounter: Payer: Self-pay | Admitting: Internal Medicine

## 2017-07-14 NOTE — Progress Notes (Signed)
I receive note from endocrinologist Dr. Sharl MaKerr with [facility]. Pt with [facility]. Plan for needle biopsy then thyroid surgery vs suppressive thyroid hormone therapy.

## 2017-07-27 ENCOUNTER — Ambulatory Visit: Payer: Medicaid Other | Attending: Internal Medicine | Admitting: Internal Medicine

## 2017-07-27 ENCOUNTER — Encounter: Payer: Self-pay | Admitting: Internal Medicine

## 2017-07-27 VITALS — BP 120/72 | HR 77 | Temp 97.4°F | Resp 16 | Wt 95.4 lb

## 2017-07-27 DIAGNOSIS — Z9049 Acquired absence of other specified parts of digestive tract: Secondary | ICD-10-CM | POA: Diagnosis not present

## 2017-07-27 DIAGNOSIS — E041 Nontoxic single thyroid nodule: Secondary | ICD-10-CM | POA: Diagnosis not present

## 2017-07-27 DIAGNOSIS — M5416 Radiculopathy, lumbar region: Secondary | ICD-10-CM | POA: Diagnosis not present

## 2017-07-27 DIAGNOSIS — Z91018 Allergy to other foods: Secondary | ICD-10-CM | POA: Insufficient documentation

## 2017-07-27 DIAGNOSIS — M545 Low back pain: Secondary | ICD-10-CM | POA: Diagnosis not present

## 2017-07-27 DIAGNOSIS — M8008XD Age-related osteoporosis with current pathological fracture, vertebra(e), subsequent encounter for fracture with routine healing: Secondary | ICD-10-CM

## 2017-07-27 DIAGNOSIS — E118 Type 2 diabetes mellitus with unspecified complications: Secondary | ICD-10-CM | POA: Diagnosis not present

## 2017-07-27 DIAGNOSIS — K219 Gastro-esophageal reflux disease without esophagitis: Secondary | ICD-10-CM | POA: Diagnosis not present

## 2017-07-27 DIAGNOSIS — Z794 Long term (current) use of insulin: Secondary | ICD-10-CM | POA: Diagnosis not present

## 2017-07-27 DIAGNOSIS — Z79899 Other long term (current) drug therapy: Secondary | ICD-10-CM | POA: Insufficient documentation

## 2017-07-27 DIAGNOSIS — E1165 Type 2 diabetes mellitus with hyperglycemia: Secondary | ICD-10-CM | POA: Insufficient documentation

## 2017-07-27 DIAGNOSIS — D649 Anemia, unspecified: Secondary | ICD-10-CM | POA: Diagnosis not present

## 2017-07-27 DIAGNOSIS — K089 Disorder of teeth and supporting structures, unspecified: Secondary | ICD-10-CM | POA: Diagnosis not present

## 2017-07-27 DIAGNOSIS — Z91012 Allergy to eggs: Secondary | ICD-10-CM | POA: Insufficient documentation

## 2017-07-27 DIAGNOSIS — K009 Disorder of tooth development, unspecified: Secondary | ICD-10-CM | POA: Diagnosis not present

## 2017-07-27 DIAGNOSIS — E1140 Type 2 diabetes mellitus with diabetic neuropathy, unspecified: Secondary | ICD-10-CM | POA: Insufficient documentation

## 2017-07-27 DIAGNOSIS — I10 Essential (primary) hypertension: Secondary | ICD-10-CM | POA: Diagnosis not present

## 2017-07-27 DIAGNOSIS — Z91013 Allergy to seafood: Secondary | ICD-10-CM | POA: Insufficient documentation

## 2017-07-27 DIAGNOSIS — IMO0002 Reserved for concepts with insufficient information to code with codable children: Secondary | ICD-10-CM

## 2017-07-27 LAB — POCT URINALYSIS DIPSTICK
BILIRUBIN UA: NEGATIVE
GLUCOSE UA: 500
Ketones, UA: NEGATIVE
Leukocytes, UA: NEGATIVE
Nitrite, UA: POSITIVE
Protein, UA: NEGATIVE
RBC UA: NEGATIVE
Spec Grav, UA: <=1.005 — AB (ref 1.010–1.025)
Urobilinogen, UA: 0.2 E.U./dL
pH, UA: 5.5 (ref 5.0–8.0)

## 2017-07-27 LAB — GLUCOSE, POCT (MANUAL RESULT ENTRY)
POC GLUCOSE: 486 mg/dL — AB (ref 70–99)
POC Glucose: 388 mg/dl — AB (ref 70–99)

## 2017-07-27 MED ORDER — ALENDRONATE SODIUM 70 MG PO TABS: 70.0000 mg | ORAL_TABLET | ORAL | 6 refills | Status: DC

## 2017-07-27 MED ORDER — INSULIN ASPART 100 UNIT/ML FLEXPEN: PEN_INJECTOR | SUBCUTANEOUS | 11 refills | Status: DC

## 2017-07-27 MED ORDER — INSULIN ASPART 100 UNIT/ML ~~LOC~~ SOLN
10.0000 [IU] | Freq: Once | SUBCUTANEOUS | Status: AC
  Administered 2017-07-27: 10 [IU] via SUBCUTANEOUS

## 2017-07-27 NOTE — Progress Notes (Signed)
Subjective:  Patient ID: Yehuda BuddMoihh Falk, female    DOB: Aug 22, 1945  Age: 72 y.o. 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 She is here with her daughter and Donavan BurnetWeir Siu who is an interpreter from [employer].  T2 DM: Reports adherence with medication. Checks fasting blood sugar once a day in the am. Her log is not very accurate, reveals blood sugar between 18-500s. Has not had her diabetic eye done this year. Admits to occasional neuropathic pain of extremities which improves with gabapentin.   Osteoporosis: On Fosamax, she has not taken it this week because she "ran out", she thought that she only had to take it for one month. She is taken both Calcium and Vitamin D supplements.   C/o back pain today, does not taken any otc medications at home. Gabapentin helps some. Has not heard back from nuerosurgery, waiting on them to call so she can reschedule her appointment.  Thyroid nodule: Follows Endocrinologist Dr. Sharl MaKerr with [facility]. Plan for needle biopsy then thyroid surgery vs suppressive thyroid hormone therapy.  Would like referral to dentist. Feels like her teeth are loose and she just wants them all removed.    Past Medical History:  Diagnosis Date  . Arthritis   . Diabetes mellitus without complication (HCC)    stopped DM meds 2004  . GERD (gastroesophageal reflux disease)   . Hypertension    meds stopped 1 week ago , need to get renewed  . Tuberculosis    ACTIVE   Past Surgical History:  Procedure Laterality Date  . ABDOMINAL HYSTERECTOMY    . CHOLECYSTECTOMY N/A 05/02/2014   Procedure: LAPAROSCOPIC CHOLECYSTECTOMY ;  Surgeon: Abigail Miyamotoouglas Blackman, MD;  [facility];  Service: General;  Laterality: N/A;  laparoscopic cholecystectomy  .  COLONOSCOPY    . NO PAST SURGERIES    . VIDEO BRONCHOSCOPY Bilateral 01/10/2016   Procedure: VIDEO BRONCHOSCOPY WITHOUT FLUORO;  Surgeon: Leslye Peerobert S Byrum, MD;  [facility];  Service: Cardiopulmonary;  Laterality: Bilateral;   Allergies  Allergen Reactions  . Beef-Derived Products Other (See Comments)    Generalized burning sensation  . Eggs Or Egg-Derived Products Other (See Comments)    Generalized burning sensation  . Fish Allergy Other (See Comments)    Generalized burning sensation  . Fruit & Vegetable Daily [Nutritional Supplements] Other (See Comments)    Orange causes lower extremity burning     Outpatient Medications Prior to Visit  Medication Sig Dispense Refill  . alendronate (FOSAMAX) 70 MG tablet Take 1 tablet (70 mg total) by mouth once a week. Take with a full glass of water on an empty stomach. 12 tablet 6  . calcium carbonate (OS-CAL) 600 MG tablet Take 1 tablet (600 mg total) by mouth 2 (two) times daily with a meal. 60 tablet 6  . gabapentin (NEURONTIN) 300 MG capsule Take 2 capsules (600 mg total) by mouth 2 (two) times daily. 120 capsule 6  . insulin aspart (NOVOLOG FLEXPEN) 100 UNIT/ML FlexPen 3 units with breakfast and dinner 15 mL 11  . Insulin Glargine (LANTUS SOLOSTAR) 100 UNIT/ML Solostar Pen Inject 28 Units into the skin daily at 10 pm. 5 pen 5  . Insulin Pen Needle (BD PEN NEEDLE NANO U/F) 32G X 4 MM MISC USE TO INJECT INSULIN  100 each 2  . Propylene Glycol (SYSTANE COMPLETE) 0.6 % SOLN Apply 1 drop daily as needed to eye (for dry eyes).    . Vitamin D, Cholecalciferol, 400 units CAPS Take 800 Units by mouth daily. 120 capsule 6   No facility-administered medications prior to visit.     ROS Review of Systems  Constitutional: Negative.   HENT: Positive for dental problem (reports feeling that all her teeth are loose).   Eyes: Negative for visual disturbance.  Respiratory: Negative for cough and shortness of breath.   Cardiovascular: Negative  for chest pain and palpitations.  Gastrointestinal: Negative.   Genitourinary: Negative.   Musculoskeletal: Positive for back pain.  Skin: Negative.   Neurological: Negative for dizziness, light-headedness and numbness.  Psychiatric/Behavioral: Negative.     Objective:  BP 120/72   Pulse 77   Temp (!) 97.4 F (36.3 C) (Oral)   Resp 16   Wt 95 lb 6.4 oz (43.3 kg)   LMP 05/11/2016   SpO2 98%   BMI 19.27 kg/m   BP/Weight 07/27/2017 06/29/2017 06/01/2017  Systolic BP 120 152 70  Diastolic BP 72 84 40  Wt. (Lbs) 95.4 93.8 94  BMI 19.27 18.95 18.99   Results for orders placed or performed in visit on 07/27/17  POCT glucose (manual entry)  Result Value Ref Range   POC Glucose 486 (A) 70 - 99 mg/dl  POCT urinalysis dipstick  Result Value Ref Range   Color, UA yellow    Clarity, UA cloudy    Glucose, UA 500    Bilirubin, UA negative    Ketones, UA negative    Spec Grav, UA <=1.005 (A) 1.010 - 1.025   Blood, UA negative    pH, UA 5.5 5.0 - 8.0   Protein, UA negative    Urobilinogen, UA 0.2 0.2 or 1.0 E.U./dL   Nitrite, UA positive    Leukocytes, UA Negative Negative   Appearance     Odor    POCT glucose (manual entry)  Result Value Ref Range   POC Glucose 388 (A) 70 - 99 mg/dl    Physical Exam  Constitutional: She is oriented to person, place, and time. She appears well-developed and well-nourished.  Fragile, thin  HENT:  Mouth/Throat: Oropharynx is clear and moist. No oropharyngeal exudate.  Poor dentition  Neck: Normal range of motion.  Thyroid nodule on the left   Cardiovascular: Normal rate, regular rhythm, normal heart sounds and intact distal pulses.  Pulmonary/Chest: Effort normal and breath sounds normal.  Abdominal: Soft. Bowel sounds are normal.  Musculoskeletal: Normal range of motion. She exhibits tenderness (low back pain ). She exhibits no edema.  Neurological: She is alert and oriented to person, place, and time.  Skin: Skin is warm and dry.    Psychiatric: She has a normal mood and affect. Her behavior is normal.  Vitals reviewed.    Assessment & Plan:   1. Diabetes mellitus type 2, uncontrolled, with complications (HCC) Uncontrolled. Blood sugar today 486. Last A1c 9.3 (in January). 10 units of Novolog given in clinic today. Follow-up blood sugar is 388.  Increased Meal time novolog from 3 to 5 units twice daily. Discussed lifestyle changes including low sugar diet. Encouraged use of blood sugar log. Does want referral for eye exam.  -POCT glucose (manual entry) - Microalbumin / creatinine urine ratio - POCT urinalysis dipstick - Ambulatory referral to Ophthalmology - insulin aspart (novoLOG) injection 10 Units - insulin aspart (NOVOLOG FLEXPEN) 100 UNIT/ML FlexPen; 5 units  with breakfast and dinner  Dispense: 15 mL; Refill: 11  2. Thyroid nodule Follows Endocrinology. Nodule is stable, current plan is to monitor.   3. Pathological fracture of vertebra due to osteoporosis with routine healing, unspecified osteoporosis type, subsequent encounter Refill Fosamax, Continue Vit D and Calcium supplements.  - alendronate (FOSAMAX) 70 MG tablet; Take 1 tablet (70 mg total) by mouth once a week. Take with a full glass of water on an empty stomach.  Dispense: 12 tablet; Refill: 6  4. Dental anomaly - Ambulatory referral to Dentistry  5. Lumbar radiculopathy Encouraged use of Tylenol twice as needed for back pain. Will check with scheduler about neurosurgery referral.   Meds ordered this encounter  Medications  . insulin aspart (novoLOG) injection 10 Units  . alendronate (FOSAMAX) 70 MG tablet    Sig: Take 1 tablet (70 mg total) by mouth once a week. Take with a full glass of water on an empty stomach.    Dispense:  12 tablet    Refill:  6  . insulin aspart (NOVOLOG FLEXPEN) 100 UNIT/ML FlexPen    Sig: 5 units with breakfast and dinner    Dispense:  15 mL    Refill:  11    Follow-up: Follow-up in 2 weeks with Misty Stanley  (Pharmacist) to review blood sugar readings and change medication as needed  Hassan Buckler DNP Student

## 2017-07-27 NOTE — Patient Instructions (Addendum)
Extra Strength Tylenol  twice a day as needed for pain.  Fall Prevention in the Home Falls can cause injuries. They can happen to people of all ages. There are many things you can do to make your home safe and to help prevent falls. What can I do on the outside of my home?  Regularly fix the edges of walkways and driveways and fix any cracks.  Remove anything that might make you trip as you walk through a door, such as a raised step or threshold.  Trim any bushes or trees on the path to your home.  Use bright outdoor lighting.  Clear any walking paths of anything that might make someone trip, such as rocks or tools.  Regularly check to see if handrails are loose or broken. Make sure that both sides of any steps have handrails.  Any raised decks and porches should have guardrails on the edges.  Have any leaves, snow, or ice cleared regularly.  Use sand or salt on walking paths during winter.  Clean up any spills in your garage right away. This includes oil or grease spills. What can I do in the bathroom?  Use night lights.  Install grab bars by the toilet and in the tub and shower. Do not use towel bars as grab bars.  Use non-skid mats or decals in the tub or shower.  If you need to sit down in the shower, use a plastic, non-slip stool.  Keep the floor dry. Clean up any water that spills on the floor as soon as it happens.  Remove soap buildup in the tub or shower regularly.  Attach bath mats securely with double-sided non-slip rug tape.  Do not have throw rugs and other things on the floor that can make you trip. What can I do in the bedroom?  Use night lights.  Make sure that you have a light by your bed that is easy to reach.  Do not use any sheets or blankets that are too big for your bed. They should not hang down onto the floor.  Have a firm chair that has side arms. You can use this for support while you get dressed.  Do not have throw rugs and other  things on the floor that can make you trip. What can I do in the kitchen?  Clean up any spills right away.  Avoid walking on wet floors.  Keep items that you use a lot in easy-to-reach places.  If you need to reach something above you, use a strong step stool that has a grab bar.  Keep electrical cords out of the way.  Do not use floor polish or wax that makes floors slippery. If you must use wax, use non-skid floor wax.  Do not have throw rugs and other things on the floor that can make you trip. What can I do with my stairs?  Do not leave any items on the stairs.  Make sure that there are handrails on both sides of the stairs and use them. Fix handrails that are broken or loose. Make sure that handrails are as long as the stairways.  Check any carpeting to make sure that it is firmly attached to the stairs. Fix any carpet that is loose or worn.  Avoid having throw rugs at the top or bottom of the stairs. If you do have throw rugs, attach them to the floor with carpet tape.  Make sure that you have a light switch at the top  of the stairs and the bottom of the stairs. If you do not have them, ask someone to add them for you. What else can I do to help prevent falls?  Wear shoes that: ? Do not have high heels. ? Have rubber bottoms. ? Are comfortable and fit you well. ? Are closed at the toe. Do not wear sandals.  If you use a stepladder: ? Make sure that it is fully opened. Do not climb a closed stepladder. ? Make sure that both sides of the stepladder are locked into place. ? Ask someone to hold it for you, if possible.  Clearly mark and make sure that you can see: ? Any grab bars or handrails. ? First and last steps. ? Where the edge of each step is.  Use tools that help you move around (mobility aids) if they are needed. These include: ? Canes. ? Walkers. ? Scooters. ? Crutches.  Turn on the lights when you go into a dark area. Replace any light bulbs as soon as  they burn out.  Set up your furniture so you have a clear path. Avoid moving your furniture around.  If any of your floors are uneven, fix them.  If there are any pets around you, be aware of where they are.  Review your medicines with your doctor. Some medicines can make you feel dizzy. This can increase your chance of falling. Ask your doctor what other things that you can do to help prevent falls. This information is not intended to replace advice given to you by your health care provider. Make sure you discuss any questions you have with your health care provider. Document Released: 03/07/2009 Document Revised: 10/17/2015 Document Reviewed: 06/15/2014 Elsevier Interactive Patient Education  2018 [employer].  Diabetes Mellitus and Nutrition When you have diabetes (diabetes mellitus), it is very important to have healthy eating habits because your blood sugar (glucose) levels are greatly affected by what you eat and drink. Eating healthy foods in the appropriate amounts, at about the same times every day, can help you:  Control your blood glucose.  Lower your risk of heart disease.  Improve your blood pressure.  Reach or maintain a healthy weight.  Every person with diabetes is different, and each person has different needs for a meal plan. Your health care provider may recommend that you work with a diet and nutrition specialist (dietitian) to make a meal plan that is best for you. Your meal plan may vary depending on factors such as:  The calories you need.  The medicines you take.  Your weight.  Your blood glucose, blood pressure, and cholesterol levels.  Your activity level.  Other health conditions you have, such as heart or kidney disease.  How do carbohydrates affect me? Carbohydrates affect your blood glucose level more than any other type of food. Eating carbohydrates naturally increases the amount of glucose in your blood. Carbohydrate counting is a method for  keeping track of how many carbohydrates you eat. Counting carbohydrates is important to keep your blood glucose at a healthy level, especially if you use insulin or take certain oral diabetes medicines. It is important to know how many carbohydrates you can safely have in each meal. This is different for every person. Your dietitian can help you calculate how many carbohydrates you should have at each meal and for snack. Foods that contain carbohydrates include:  Bread, cereal, rice, pasta, and crackers.  Potatoes and corn.  Peas, beans, and lentils.  Milk  and yogurt.  Fruit and juice.  Desserts, such as cakes, cookies, ice cream, and candy.  How does alcohol affect me? Alcohol can cause a sudden decrease in blood glucose (hypoglycemia), especially if you use insulin or take certain oral diabetes medicines. Hypoglycemia can be a life-threatening condition. Symptoms of hypoglycemia (sleepiness, dizziness, and confusion) are similar to symptoms of having too much alcohol. If your health care provider says that alcohol is safe for you, follow these guidelines:  Limit alcohol intake to no more than 1 drink per day for nonpregnant women and 2 drinks per day for men. One drink equals 12 oz of beer, 5 oz of wine, or 1 oz of hard liquor.  Do not drink on an empty stomach.  Keep yourself hydrated with water, diet soda, or unsweetened iced tea.  Keep in mind that regular soda, juice, and other mixers may contain a lot of sugar and must be counted as carbohydrates.  What are tips for following this plan? Reading food labels  Start by checking the serving size on the label. The amount of calories, carbohydrates, fats, and other nutrients listed on the label are based on one serving of the food. Many foods contain more than one serving per package.  Check the total grams (g) of carbohydrates in one serving. You can calculate the number of servings of carbohydrates in one serving by dividing the  total carbohydrates by 15. For example, if a food has 30 g of total carbohydrates, it would be equal to 2 servings of carbohydrates.  Check the number of grams (g) of saturated and trans fats in one serving. Choose foods that have low or no amount of these fats.  Check the number of milligrams (mg) of sodium in one serving. Most people should limit total sodium intake to less than 2,300 mg per day.  Always check the nutrition information of foods labeled as "low-fat" or "nonfat". These foods may be higher in added sugar or refined carbohydrates and should be avoided.  Talk to your dietitian to identify your daily goals for nutrients listed on the label. Shopping  Avoid buying canned, premade, or processed foods. These foods tend to be high in fat, sodium, and added sugar.  Shop around the outside edge of the grocery store. This includes fresh fruits and vegetables, bulk grains, fresh meats, and fresh dairy. Cooking  Use low-heat cooking methods, such as baking, instead of high-heat cooking methods like deep frying.  Cook using healthy oils, such as olive, canola, or sunflower oil.  Avoid cooking with butter, cream, or high-fat meats. Meal planning  Eat meals and snacks regularly, preferably at the same times every day. Avoid going long periods of time without eating.  Eat foods high in fiber, such as fresh fruits, vegetables, beans, and whole grains. Talk to your dietitian about how many servings of carbohydrates you can eat at each meal.  Eat 4-6 ounces of lean protein each day, such as lean meat, chicken, fish, eggs, or tofu. 1 ounce is equal to 1 ounce of meat, chicken, or fish, 1 egg, or 1/4 cup of tofu.  Eat some foods each day that contain healthy fats, such as avocado, nuts, seeds, and fish. Lifestyle   Check your blood glucose regularly.  Exercise at least 30 minutes 5 or more days each week, or as told by your health care provider.  Take medicines as told by your health  care provider.  Do not use any products that contain nicotine or tobacco,  such as cigarettes and e-cigarettes. If you need help quitting, ask your health care provider.  Work with a Social worker or diabetes educator to identify strategies to manage stress and any emotional and social challenges. What are some questions to ask my health care provider?  Do I need to meet with a diabetes educator?  Do I need to meet with a dietitian?  What number can I call if I have questions?  When are the best times to check my blood glucose? Where to find more information:  American Diabetes Association: diabetes.org/food-and-fitness/food  Academy of Nutrition and Dietetics: PokerClues.dk  [employer] of Diabetes and Digestive and Kidney Diseases (NIH): ContactWire.be Summary  A healthy meal plan will help you control your blood glucose and maintain a healthy lifestyle.  Working with a diet and nutrition specialist (dietitian) can help you make a meal plan that is best for you.  Keep in mind that carbohydrates and alcohol have immediate effects on your blood glucose levels. It is important to count carbohydrates and to use alcohol carefully. This information is not intended to replace advice given to you by your health care provider. Make sure you discuss any questions you have with your health care provider. Document Released: 02/05/2005 Document Revised: 06/15/2016 Document Reviewed: 06/15/2016 Elsevier Interactive Patient Education  [employer].

## 2017-07-28 DIAGNOSIS — M8008XD Age-related osteoporosis with current pathological fracture, vertebra(e), subsequent encounter for fracture with routine healing: Secondary | ICD-10-CM | POA: Insufficient documentation

## 2017-07-28 LAB — MICROALBUMIN / CREATININE URINE RATIO
Creatinine, Urine: 35.4 mg/dL
MICROALB/CREAT RATIO: 105.6 mg/g{creat} — AB (ref 0.0–30.0)
Microalbumin, Urine: 37.4 ug/mL

## 2017-08-10 ENCOUNTER — Ambulatory Visit: Payer: Self-pay | Admitting: Pharmacist

## 2017-09-14 ENCOUNTER — Ambulatory Visit: Payer: Self-pay | Admitting: Internal Medicine

## 2018-01-28 ENCOUNTER — Telehealth: Payer: Self-pay | Admitting: *Deleted

## 2018-01-28 DIAGNOSIS — E1142 Type 2 diabetes mellitus with diabetic polyneuropathy: Secondary | ICD-10-CM

## 2018-01-28 DIAGNOSIS — Z794 Long term (current) use of insulin: Principal | ICD-10-CM

## 2018-01-28 NOTE — Telephone Encounter (Signed)
No answer and any line and no VM option. Please schedule patient for a lab draw fasting.

## 2018-03-05 ENCOUNTER — Other Ambulatory Visit: Payer: Self-pay | Admitting: Internal Medicine

## 2018-03-05 DIAGNOSIS — Z794 Long term (current) use of insulin: Principal | ICD-10-CM

## 2018-03-05 DIAGNOSIS — E1142 Type 2 diabetes mellitus with diabetic polyneuropathy: Secondary | ICD-10-CM

## 2018-06-02 ENCOUNTER — Other Ambulatory Visit: Payer: Self-pay | Admitting: Internal Medicine

## 2018-06-02 DIAGNOSIS — IMO0002 Reserved for concepts with insufficient information to code with codable children: Secondary | ICD-10-CM

## 2018-06-02 DIAGNOSIS — E1165 Type 2 diabetes mellitus with hyperglycemia: Secondary | ICD-10-CM

## 2018-06-02 DIAGNOSIS — E118 Type 2 diabetes mellitus with unspecified complications: Principal | ICD-10-CM

## 2018-06-27 ENCOUNTER — Other Ambulatory Visit: Payer: Self-pay | Admitting: Internal Medicine

## 2018-06-27 DIAGNOSIS — IMO0002 Reserved for concepts with insufficient information to code with codable children: Secondary | ICD-10-CM

## 2018-06-27 DIAGNOSIS — E118 Type 2 diabetes mellitus with unspecified complications: Principal | ICD-10-CM

## 2018-06-27 DIAGNOSIS — E1165 Type 2 diabetes mellitus with hyperglycemia: Secondary | ICD-10-CM

## 2018-07-04 ENCOUNTER — Other Ambulatory Visit: Payer: Self-pay | Admitting: Internal Medicine

## 2018-07-04 DIAGNOSIS — E1142 Type 2 diabetes mellitus with diabetic polyneuropathy: Secondary | ICD-10-CM

## 2018-07-04 DIAGNOSIS — Z794 Long term (current) use of insulin: Principal | ICD-10-CM

## 2018-07-19 ENCOUNTER — Other Ambulatory Visit: Payer: Self-pay | Admitting: Internal Medicine

## 2018-07-19 DIAGNOSIS — Z794 Long term (current) use of insulin: Principal | ICD-10-CM

## 2018-07-19 DIAGNOSIS — E1142 Type 2 diabetes mellitus with diabetic polyneuropathy: Secondary | ICD-10-CM

## 2018-07-29 ENCOUNTER — Other Ambulatory Visit: Payer: Self-pay | Admitting: Internal Medicine

## 2018-07-29 DIAGNOSIS — E1165 Type 2 diabetes mellitus with hyperglycemia: Secondary | ICD-10-CM

## 2018-07-29 DIAGNOSIS — E118 Type 2 diabetes mellitus with unspecified complications: Principal | ICD-10-CM

## 2018-07-29 DIAGNOSIS — IMO0002 Reserved for concepts with insufficient information to code with codable children: Secondary | ICD-10-CM

## 2018-08-29 ENCOUNTER — Other Ambulatory Visit: Payer: Self-pay | Admitting: Internal Medicine

## 2018-08-29 DIAGNOSIS — IMO0002 Reserved for concepts with insufficient information to code with codable children: Secondary | ICD-10-CM

## 2018-08-29 DIAGNOSIS — E118 Type 2 diabetes mellitus with unspecified complications: Principal | ICD-10-CM

## 2018-08-29 DIAGNOSIS — E1165 Type 2 diabetes mellitus with hyperglycemia: Secondary | ICD-10-CM

## 2018-12-06 ENCOUNTER — Encounter (HOSPITAL_COMMUNITY): Payer: Self-pay

## 2018-12-06 ENCOUNTER — Emergency Department (HOSPITAL_COMMUNITY): Payer: Medicare Other

## 2018-12-06 ENCOUNTER — Other Ambulatory Visit: Payer: Self-pay

## 2018-12-06 ENCOUNTER — Emergency Department (HOSPITAL_COMMUNITY)
Admission: EM | Admit: 2018-12-06 | Discharge: 2018-12-06 | Disposition: A | Discharge status: Home / Self Care | Payer: Medicare Other | Attending: Emergency Medicine | Admitting: Emergency Medicine

## 2018-12-06 DIAGNOSIS — E1165 Type 2 diabetes mellitus with hyperglycemia: Secondary | ICD-10-CM | POA: Insufficient documentation

## 2018-12-06 DIAGNOSIS — R1084 Generalized abdominal pain: Secondary | ICD-10-CM | POA: Diagnosis not present

## 2018-12-06 DIAGNOSIS — R509 Fever, unspecified: Secondary | ICD-10-CM | POA: Insufficient documentation

## 2018-12-06 DIAGNOSIS — R0602 Shortness of breath: Secondary | ICD-10-CM | POA: Diagnosis not present

## 2018-12-06 DIAGNOSIS — I10 Essential (primary) hypertension: Secondary | ICD-10-CM | POA: Insufficient documentation

## 2018-12-06 DIAGNOSIS — Z794 Long term (current) use of insulin: Secondary | ICD-10-CM | POA: Insufficient documentation

## 2018-12-06 DIAGNOSIS — R5381 Other malaise: Secondary | ICD-10-CM | POA: Insufficient documentation

## 2018-12-06 DIAGNOSIS — R072 Precordial pain: Secondary | ICD-10-CM | POA: Insufficient documentation

## 2018-12-06 DIAGNOSIS — Z20828 Contact with and (suspected) exposure to other viral communicable diseases: Secondary | ICD-10-CM | POA: Insufficient documentation

## 2018-12-06 DIAGNOSIS — Z79899 Other long term (current) drug therapy: Secondary | ICD-10-CM | POA: Diagnosis not present

## 2018-12-06 DIAGNOSIS — R300 Dysuria: Secondary | ICD-10-CM | POA: Insufficient documentation

## 2018-12-06 DIAGNOSIS — R05 Cough: Secondary | ICD-10-CM | POA: Insufficient documentation

## 2018-12-06 DIAGNOSIS — R739 Hyperglycemia, unspecified: Secondary | ICD-10-CM

## 2018-12-06 DIAGNOSIS — R109 Unspecified abdominal pain: Secondary | ICD-10-CM | POA: Diagnosis present

## 2018-12-06 DIAGNOSIS — R110 Nausea: Secondary | ICD-10-CM | POA: Insufficient documentation

## 2018-12-06 DIAGNOSIS — R5383 Other fatigue: Secondary | ICD-10-CM | POA: Diagnosis not present

## 2018-12-06 DIAGNOSIS — M791 Myalgia, unspecified site: Secondary | ICD-10-CM | POA: Insufficient documentation

## 2018-12-06 LAB — COMPREHENSIVE METABOLIC PANEL
ALT: 46 U/L — ABNORMAL HIGH (ref 0–44)
AST: 37 U/L (ref 15–41)
Albumin: 3.6 g/dL (ref 3.5–5.0)
Alkaline Phosphatase: 93 U/L (ref 38–126)
Anion gap: 10 (ref 5–15)
BUN: 13 mg/dL (ref 8–23)
CO2: 24 mmol/L (ref 22–32)
Calcium: 8.6 mg/dL — ABNORMAL LOW (ref 8.9–10.3)
Chloride: 97 mmol/L — ABNORMAL LOW (ref 98–111)
Creatinine, Ser: 0.61 mg/dL (ref 0.44–1.00)
GFR calc Af Amer: >60 mL/min (ref >60)
GFR calc non Af Amer: >60 mL/min (ref >60)
Glucose, Bld: 344 mg/dL — ABNORMAL HIGH (ref 70–99)
Potassium: 4 mmol/L (ref 3.5–5.1)
Sodium: 131 mmol/L — ABNORMAL LOW (ref 135–145)
Total Bilirubin: 0.8 mg/dL (ref 0.3–1.2)
Total Protein: 6.6 g/dL (ref 6.5–8.1)

## 2018-12-06 LAB — BLOOD GAS, VENOUS
Acid-base deficit: 3.7 mmol/L — ABNORMAL HIGH (ref 0.0–2.0)
Bicarbonate: 19.8 mmol/L — ABNORMAL LOW (ref 20.0–28.0)
O2 Saturation: 76.8 %
Patient temperature: 98.6
pCO2, Ven: 32.4 mmHg — ABNORMAL LOW (ref 44.0–60.0)
pH, Ven: 7.404 (ref 7.250–7.430)
pO2, Ven: 46 mmHg — ABNORMAL HIGH (ref 32.0–45.0)

## 2018-12-06 LAB — CBC WITH DIFFERENTIAL/PLATELET
Abs Immature Granulocytes: 0.02 10*3/uL (ref 0.00–0.07)
Basophils Absolute: 0 10*3/uL (ref 0.0–0.1)
Basophils Relative: 1 %
Eosinophils Absolute: 0.1 10*3/uL (ref 0.0–0.5)
Eosinophils Relative: 2 %
HCT: 31 % — ABNORMAL LOW (ref 36.0–46.0)
Hemoglobin: 9.9 g/dL — ABNORMAL LOW (ref 12.0–15.0)
Immature Granulocytes: 0 %
Lymphocytes Relative: 29 %
Lymphs Abs: 1.4 10*3/uL (ref 0.7–4.0)
MCH: 21.1 pg — ABNORMAL LOW (ref 26.0–34.0)
MCHC: 31.9 g/dL (ref 30.0–36.0)
MCV: 66 fL — ABNORMAL LOW (ref 80.0–100.0)
Monocytes Absolute: 0.2 10*3/uL (ref 0.1–1.0)
Monocytes Relative: 5 %
Neutro Abs: 3 10*3/uL (ref 1.7–7.7)
Neutrophils Relative %: 63 %
Platelets: 77 10*3/uL — ABNORMAL LOW (ref 150–400)
RBC: 4.7 MIL/uL (ref 3.87–5.11)
RDW: 15 % (ref 11.5–15.5)
WBC: 4.8 10*3/uL (ref 4.0–10.5)
nRBC: 0 % (ref 0.0–0.2)

## 2018-12-06 LAB — URINALYSIS, ROUTINE W REFLEX MICROSCOPIC
Bacteria, UA: NONE SEEN
Bilirubin Urine: NEGATIVE
Glucose, UA: >=500 mg/dL — AB
Hgb urine dipstick: NEGATIVE
Ketones, ur: NEGATIVE mg/dL
Leukocytes,Ua: NEGATIVE
Nitrite: NEGATIVE
Protein, ur: NEGATIVE mg/dL
Specific Gravity, Urine: 1.013 (ref 1.005–1.030)
pH: 7 (ref 5.0–8.0)

## 2018-12-06 LAB — BRAIN NATRIURETIC PEPTIDE: B Natriuretic Peptide: 65 pg/mL (ref 0.0–100.0)

## 2018-12-06 LAB — LIPASE, BLOOD: Lipase: 45 U/L (ref 11–51)

## 2018-12-06 LAB — LACTIC ACID, PLASMA: Lactic Acid, Venous: 1.4 mmol/L (ref 0.5–1.9)

## 2018-12-06 LAB — D-DIMER, QUANTITATIVE: D-Dimer, Quant: 0.49 ug/mL-FEU (ref 0.00–0.50)

## 2018-12-06 LAB — TSH: TSH: 0.732 u[IU]/mL (ref 0.350–4.500)

## 2018-12-06 LAB — MAGNESIUM: Magnesium: 1.9 mg/dL (ref 1.7–2.4)

## 2018-12-06 LAB — TROPONIN I (HIGH SENSITIVITY): Troponin I (High Sensitivity): 3 ng/L (ref <18)

## 2018-12-06 LAB — SARS CORONAVIRUS 2 BY RT PCR (HOSPITAL ORDER, PERFORMED IN ~~LOC~~ HOSPITAL LAB): SARS Coronavirus 2: NEGATIVE

## 2018-12-06 LAB — CBG MONITORING, ED: Glucose-Capillary: 348 mg/dL — ABNORMAL HIGH (ref 70–99)

## 2018-12-06 MED ORDER — SODIUM CHLORIDE 0.9 % IV BOLUS
1000.0000 mL | Freq: Once | INTRAVENOUS | Status: AC
  Administered 2018-12-06: 1000 mL via INTRAVENOUS

## 2018-12-06 MED ORDER — SODIUM CHLORIDE (PF) 0.9 % IJ SOLN
INTRAMUSCULAR | Status: AC
  Filled 2018-12-06: qty 50

## 2018-12-06 MED ORDER — IOHEXOL 300 MG/ML  SOLN
75.0000 mL | Freq: Once | INTRAMUSCULAR | Status: AC | PRN
  Administered 2018-12-06: 19:00:00 75 mL via INTRAVENOUS

## 2018-12-06 MED ORDER — IOHEXOL 300 MG/ML  SOLN
30.0000 mL | Freq: Once | INTRAMUSCULAR | Status: AC | PRN
  Administered 2018-12-06: 30 mL via ORAL

## 2018-12-06 NOTE — ED Provider Notes (Signed)
[facility]-EMERGENCY DEPT Provider Note   CSN: [id number] Arrival date & time: 12/06/18  1228     History   Chief Complaint Chief Complaint  Patient presents with   Hyperglycemia   Abdominal Pain    HPI Olivia Williamson is a 73 y.o. female.     The history is provided by the patient and medical records. The history is limited by a language barrier. A language interpreter was used.  Chest Pain Pain location:  Substernal area Pain quality: aching   Pain radiates to:  Epigastrium Pain severity:  Moderate Onset quality:  Gradual Timing:  Intermittent Progression:  Waxing and waning Chronicity:  New Relieved by:  Nothing Worsened by:  Exertion and deep breathing Ineffective treatments:  None tried Associated symptoms: abdominal pain, cough, fatigue, nausea and shortness of breath   Associated symptoms: no back pain, no claudication, no diaphoresis, no dizziness, no headache, no numbness, no palpitations, no syncope, no vomiting and no weakness   Risk factors: diabetes mellitus and hypertension   Risk factors: not female and no prior DVT/PE     Past Medical History:  Diagnosis Date   Arthritis    Diabetes mellitus without complication (HCC)    stopped DM meds 2004   GERD (gastroesophageal reflux disease)    Hypertension    meds stopped 1 week ago , need to get renewed   Tuberculosis    ACTIVE    Patient Active Problem List   Diagnosis Date Noted   Pathological fracture of vertebra due to osteoporosis with routine healing 07/28/2017   Lumbar radiculopathy 02/02/2017   Chronic left-sided thoracic back pain 02/02/2017   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   Multinodular goiter 11/01/2016   Thyroid nodule 04/13/2016   Abnormal CT of the chest    Tuberculosis 01/09/2016   Protein-calorie malnutrition, severe 12/16/2015   Generalized weakness 12/15/2015   GERD (gastroesophageal reflux disease)    Diabetes mellitus with neurological  manifestations (HCC)     Past Surgical History:  Procedure Laterality Date   ABDOMINAL HYSTERECTOMY     CHOLECYSTECTOMY N/A 05/02/2014   Procedure: LAPAROSCOPIC CHOLECYSTECTOMY ;  Surgeon: Abigail Miyamoto, MD;  Location: [facility] OR;  Service: General;  Laterality: N/A;  laparoscopic cholecystectomy   COLONOSCOPY     NO PAST SURGERIES     VIDEO BRONCHOSCOPY Bilateral 01/10/2016   Procedure: VIDEO BRONCHOSCOPY WITHOUT FLUORO;  Surgeon: Leslye Peer, MD;  Location: [facility] ENDOSCOPY;  Service: Cardiopulmonary;  Laterality: Bilateral;     OB History   No obstetric history on file.      Home Medications    Prior to Admission medications   Medication Sig Start Date End Date Taking? Authorizing Provider  alendronate (FOSAMAX) 70 MG tablet Take 1 tablet (70 mg total) by mouth once a week. Take with a full glass of water on an empty stomach. 07/27/17   Marcine Matar, MD  calcium carbonate (OS-CAL) 600 MG tablet Take 1 tablet (600 mg total) by mouth 2 (two) times daily with a meal. 06/01/17   Marcine Matar, MD  gabapentin (NEURONTIN) 300 MG capsule Take 2 capsules (600 mg total) by mouth 2 (two) times daily. 10/27/16   Marcine Matar, MD  insulin aspart (NOVOLOG FLEXPEN) 100 UNIT/ML FlexPen 5 units with breakfast and dinner 07/27/17   Marcine Matar, MD  Insulin Glargine (LANTUS) 100 UNIT/ML Solostar Pen Inject 28 Units into the skin daily at 10 pm. MUST MAKE APPT FOR FURTHER REFILLS 06/03/18  [name], MD  Insulin Pen Needle (BD PEN NEEDLE NANO U/F) 32G X 4 MM MISC USE TO INJECT INSULIN 03/07/18   [name], MD  Propylene Glycol (SYSTANE COMPLETE) 0.6 % SOLN Apply 1 drop daily as needed to eye (for dry eyes).    [provider]  Vitamin D, Cholecalciferol, 400 units CAPS Take 800 Units by mouth daily. 06/29/17   [name], MD    Family History Family History  Problem Relation Age of Onset   Heart disease Maternal Uncle 70    Social  History Social History   Tobacco Use   Smoking status: Never Smoker   Smokeless tobacco: Never Used  Substance Use Topics   Alcohol use: No   Drug use: No     Allergies   Beef-derived products, Eggs or egg-derived products, Fish allergy, and Fruit & vegetable daily [nutritional supplements]   Review of Systems Review of Systems  Constitutional: Positive for chills and fatigue. Negative for diaphoresis.  HENT: Negative for congestion and rhinorrhea.   Eyes: Negative for visual disturbance.  Respiratory: Positive for cough and shortness of breath. Negative for chest tightness and wheezing.   Cardiovascular: Positive for chest pain. Negative for palpitations, claudication, leg swelling and syncope.  Gastrointestinal: Positive for abdominal pain and nausea. Negative for constipation, diarrhea and vomiting.  Genitourinary: Positive for dysuria. Negative for frequency.  Musculoskeletal: Negative for back pain, neck pain and neck stiffness.  Skin: Negative for rash and wound.  Neurological: Negative for dizziness, weakness, light-headedness, numbness and headaches.  Psychiatric/Behavioral: Negative for agitation.  All other systems reviewed and are negative.    Physical Exam Updated Vital Signs BP (!) 168/70    Pulse 64    Temp 97.7 F (36.5 C) (Oral)    Resp 16    LMP 05/11/2016    SpO2 100%   Physical Exam Vitals signs and nursing note reviewed.  Constitutional:      General: She is not in acute distress.    Appearance: She is well-developed. She is not ill-appearing, toxic-appearing or diaphoretic.  HENT:     Head: Normocephalic and atraumatic.  Eyes:     Conjunctiva/sclera: Conjunctivae normal.  Neck:     Musculoskeletal: Neck supple.  Cardiovascular:     Rate and Rhythm: Normal rate and regular rhythm.     Heart sounds: Normal heart sounds. No murmur.  Pulmonary:     Effort: Pulmonary effort is normal. No respiratory distress.     Breath sounds: Normal breath  sounds. No wheezing, rhonchi or rales.  Chest:     Chest wall: No tenderness.  Abdominal:     General: Abdomen is flat. Bowel sounds are normal.     Palpations: Abdomen is soft.     Tenderness: There is generalized abdominal tenderness. There is no right CVA tenderness, left CVA tenderness, guarding or rebound.  Skin:    General: Skin is warm and dry.     Capillary Refill: Capillary refill takes less than 2 seconds.  Neurological:     General: No focal deficit present.     Mental Status: She is alert.  Psychiatric:        Mood and Affect: Mood normal.      ED Treatments / Results  Labs (all labs ordered are listed, but only abnormal results are displayed) Labs Reviewed  BLOOD GAS, VENOUS - Abnormal; Notable for the following components:      Result Value   pCO2, Ven 32.4 (*)  pO2, Ven 46.0 (*)    Bicarbonate 19.8 (*)    Acid-base deficit 3.7 (*)    All other components within normal limits  CBC WITH DIFFERENTIAL/PLATELET - Abnormal; Notable for the following components:   Hemoglobin 9.9 (*)    HCT 31.0 (*)    MCV 66.0 (*)    MCH 21.1 (*)    Platelets 77 (*)    All other components within normal limits  COMPREHENSIVE METABOLIC PANEL - Abnormal; Notable for the following components:   Sodium 131 (*)    Chloride 97 (*)    Glucose, Bld 344 (*)    Calcium 8.6 (*)    ALT 46 (*)    All other components within normal limits  URINALYSIS, ROUTINE W REFLEX MICROSCOPIC - Abnormal; Notable for the following components:   Color, Urine STRAW (*)    Glucose, UA >=500 (*)    All other components within normal limits  CBG MONITORING, ED - Abnormal; Notable for the following components:   Glucose-Capillary 348 (*)    All other components within normal limits  SARS CORONAVIRUS 2 (HOSPITAL ORDER, PERFORMED IN [facility])  URINE CULTURE  LIPASE, BLOOD  LACTIC ACID, PLASMA  MAGNESIUM  TSH  BRAIN NATRIURETIC PEPTIDE  D-DIMER, QUANTITATIVE (NOT AT [facility])  CBG  MONITORING, ED  TROPONIN I (HIGH SENSITIVITY)    EKG EKG Interpretation  Date/Time:  Tuesday December 06 2018 14:17:29 EDT Ventricular Rate:  69 PR Interval:    QRS Duration: 77 QT Interval:  412 QTC Calculation: 442 R Axis:   31 Text Interpretation:  Sinus rhythm Anteroseptal infarct, age indeterminate When compared to prior, no significant changes seen.  No STEMI Confirmed by Theda Belfastegeler, Chris ([id number]) on 12/06/2018 2:27:05 PM   Radiology Ct Abdomen Pelvis W Contrast  Result Date: 12/06/2018 CLINICAL DATA:  Abdominal pain. Hyperglycemic with blood glucose equal 564. Diabetic. EXAM: CT ABDOMEN AND PELVIS WITH CONTRAST TECHNIQUE: Multidetector CT imaging of the abdomen and pelvis was performed using the standard protocol following bolus administration of intravenous contrast. CONTRAST:  75mL OMNIPAQUE IOHEXOL 300 MG/ML SOLN, 30mL OMNIPAQUE IOHEXOL 300 MG/ML SOLN COMPARISON:  CT 05/22/2016 FINDINGS: Lower chest: Band of linear atelectasis/scarring in the RIGHT lung base. Chronic finding improved from comparison CT. Hepatobiliary: No focal hepatic lesion. Postcholecystectomy. No biliary dilatation. Small hypodense lesion in the anterior RIGHT hepatic lobe measuring 5 mm is likely benign cysts. Common bile duct measures 6 mm which is upper limits of normal. Small peri ampullary duodenum diverticulum. Pancreas: Pancreas is normal. No ductal dilatation. No pancreatic inflammation. Spleen: Normal spleen Adrenals/urinary tract: Adrenal glands and kidneys are normal. The ureters and bladder normal. Stomach/Bowel: Stomach, small-bowel normal. No evidence of bowel obstruction. Appendix normal. There is edema versus nondistention in the cecum. The ascending and transverse colon normal. Descending colon and rectosigmoid colon normal. Vascular/Lymphatic: Abdominal aorta is normal caliber. No periportal or retroperitoneal adenopathy. No pelvic adenopathy. Reproductive: Uterus and ovaries are normal. Other: No free  fluid the pelvis.  No intraperitoneal free air. Musculoskeletal: No aggressive osseous lesion. IMPRESSION: 1. No clear acute findings in the abdomen pelvis. 2. Edema within the cecum versus nondistention cecum. Appendix is normal. 3. Postcholecystectomy.  No biliary duct dilatation. Electronically Signed   By: Genevive BiStewart  Edmunds M.D.   On: 12/06/2018 19:06   Dg Chest Portable 1 View  Result Date: 12/06/2018 CLINICAL DATA:  Abdominal pain. EXAM: PORTABLE CHEST 1 VIEW COMPARISON:  April 06, 2018 FINDINGS: Cardiomediastinal silhouette is normal. Mediastinal contours appear intact.  Calcific atherosclerotic disease of the aorta. There is no evidence of focal airspace consolidation, pleural effusion or pneumothorax. Osseous structures are without acute abnormality. Soft tissues are grossly normal. IMPRESSION: No active disease. Electronically Signed   By: Ted Mcalpineobrinka  Dimitrova M.D.   On: 12/06/2018 14:59    Procedures Procedures (including critical care time)  Yehuda BuddMoihh Urquilla was evaluated in Emergency Department on 12/06/2018 for the symptoms described in the history of present illness. She was evaluated in the context of the global COVID-19 pandemic, which necessitated consideration that the patient might be at risk for infection with the SARS-CoV-2 virus that causes COVID-19. Institutional protocols and algorithms that pertain to the evaluation of patients at risk for COVID-19 are in a state of rapid change based on information released by regulatory bodies including the CDC and federal and state organizations. These policies and algorithms were followed during the patient's care in the ED.   Medications Ordered in ED Medications  sodium chloride (PF) 0.9 % injection (has no administration in time range)  sodium chloride 0.9 % bolus 1,000 mL (0 mLs Intravenous Stopped 12/06/18 1628)  iohexol (OMNIPAQUE) 300 MG/ML solution 75 mL (75 mLs Intravenous Contrast Given 12/06/18 1831)  iohexol (OMNIPAQUE) 300 MG/ML  solution 30 mL (30 mLs Oral Contrast Given 12/06/18 1831)     Initial Impression / Assessment and Plan / ED Course  I have reviewed the triage vital signs and the nursing notes.  Pertinent labs & imaging results that were available during my care of the patient were reviewed by me and considered in my medical decision making (see chart for details).        Yehuda BuddMoihh Whiteside is a 73 y.o. female who has history of diabetes, hypertension note is currently untreated as well as chart showing history of tuberculosis completing treatment in 2018 who presents via EMS for multiple complaints including abdominal pain, nausea, subjective fevers, chills, cough, chest pain, shortness of breath, dysuria, malaise, and hyperglycemia.  According to EMS, patient was found to have a glucose of over 500 on arrival.  Difficult to obtain history from patient or family however [place] interpreter was used ([phone]).  Patient reports that for the last few weeks she has had worsening pleuritic and exertional chest discomfort, shortness of breath, and some mild cough.  She reports that her legs have been sore bilaterally but she says they are not swelling.  She reports she has had some dysuria and has had severe pleuritic abdominal pain.  Chart review shows that she has had a cholecystectomy in the past.  She denies any constipation or diarrhea.  She reports has been having chills and feeling very hot.  She reportedly does not take any medications and EMS reportedly found an unopened bottle of Lantus in her fridge from February.  Patient reports abdominal pain is severe and that is her primary complaint today.   On exam, lungs are clear and chest is nontender.  Abdomen is tender to palpation.  Legs are not edematous and non-tender.  Difficult historian due to the language barrier despite interpreter.  Initial glucose is 348, improved after 500 cc normal saline with EMS.   Given patient's report of hyperglycemia, will look for  occult infection with her subjective fevers and chills concerning to her hyperglycemia.  Will get urinalysis and chest x-ray.  We will also obtain screening labs for her abdominal discomfort as well as troponin and d-dimer with the exertional and pleuritic chest pain and shortness of breath.  Will check for  coronavirus.  We will give more fluids for the hyperglycemia and monitor her glucose.  We will wait until d-dimer has returned to order CT imaging of the abdomen and pelvis as she may need a PE study as well.  Anticipate reassessment after work-up.  Patient's diagnostic work-up was overall reassuring.  Patient has no evidence of DKA.  Coronavirus test negative.  Urinalysis shows no infection.  Platelets are low similar to prior.  She is not septic.  No evidence of infection on exam.  Troponin negative.  Lipase normal.  Magnesium normal.  Thyroid normal.  CT scan shows no acute intra-abdominal pathology causing her symptoms.  Chest x-ray shows no pneumonia.  EKG reassuring.  Her fluids, glucose was in the 200s and much better.  Patient is feeling better.  Suspect hyperglycemia contributing some of her abdominal discomfort.  Had long conversation with son who agrees with discharge and patient will start her home glucose medications which she has at home.  Patient will follow-up with her PCP in the neck several days to reestablish care and to get on glucose management.  Do not feel patient needs admission given improvement in symptoms and well appearance in the emergency department.  Vital signs reassuring on reassessment.  Patient will follow-up with PCP and family understood return precautions.  Patient discharged in good condition.  Final Clinical Impressions(s) / ED Diagnoses   Final diagnoses:  Generalized abdominal pain  Hyperglycemia    ED Discharge Orders    None      Clinical Impression: 1. Generalized abdominal pain   2. Hyperglycemia     Disposition: Discharge  Condition:  Good  I have discussed the results, Dx and Tx plan with the pt(& family if present). He/she/they expressed understanding and agree(s) with the plan. Discharge instructions discussed at great length. Strict return precautions discussed and pt &/or family have verbalized understanding of the instructions. No further questions at time of discharge.    New Prescriptions   No medications on file    Follow Up: Marcine MatarJohnson, Deborah B, MD [address] [place] [id number] [phone]     [facility] [address] [id number] [facility]c [address] [id number] [phone]       Hedi Barkan, Canary Brimhristopher J, MD 12/06/18 [phone]

## 2018-12-06 NOTE — Discharge Instructions (Signed)
Your work-up today was overall reassuring.  Your glucose improved with just fluids.  Please start taking your home glucose medications again and follow-up with your primary doctor in the next 24 to 48 hours to discuss further glucose management.  Your CT image did not show any concerning findings causing your abdominal pain and your other labs were reassuring.  No evidence of acute infections.  You were feeling better, we feel you are safe for discharge home.  I spoke with your son who agrees.  Please come to the nearest emergency department if any symptoms change or worsen.

## 2018-12-06 NOTE — ED Notes (Signed)
Patient walked to the bathroom

## 2018-12-06 NOTE — ED Notes (Signed)
Son called and he is on the way to pick-up his mother.

## 2018-12-06 NOTE — ED Notes (Signed)
POC CBG 240 

## 2018-12-06 NOTE — ED Notes (Signed)
Paged the [employer] interpretor number for interpretation of DC papers and instructions, and for her to call a ride.

## 2018-12-06 NOTE — ED Notes (Signed)
Son Linna Hoff can translate speaks [place] [phone]

## 2018-12-06 NOTE — ED Notes (Signed)
[place]

## 2018-12-06 NOTE — ED Triage Notes (Addendum)
EMS reports from home, son called for abdominal pain upon inspiration. EMS CBG on scene 564, reports Pt diabetic but likely non-compliant, EMS states Lantus unopened in refrigerator since Feb. Son does not recall her taking injections. Pt speaks [place], need Optometrist.  BP 180/80 HR 70 RR 16 Sp02 98 RA CBG 564  18ga L forearm 441ml NS enroute  Son DAN can translate [phone]

## 2018-12-07 LAB — URINE CULTURE: Culture: <10000 — AB

## 2018-12-07 LAB — CBG MONITORING, ED: Glucose-Capillary: 240 mg/dL — ABNORMAL HIGH (ref 70–99)

## 2018-12-30 ENCOUNTER — Encounter: Payer: Self-pay | Admitting: Pharmacist

## 2018-12-30 ENCOUNTER — Ambulatory Visit (HOSPITAL_BASED_OUTPATIENT_CLINIC_OR_DEPARTMENT_OTHER): Payer: Medicare Other | Admitting: Pharmacist

## 2018-12-30 ENCOUNTER — Ambulatory Visit: Payer: Medicare Other | Attending: Internal Medicine | Admitting: Internal Medicine

## 2018-12-30 ENCOUNTER — Other Ambulatory Visit: Payer: Self-pay

## 2018-12-30 ENCOUNTER — Encounter: Payer: Self-pay | Admitting: Internal Medicine

## 2018-12-30 VITALS — BP 173/79 | HR 69 | Temp 98.5°F | Resp 16 | Wt 79.0 lb

## 2018-12-30 DIAGNOSIS — IMO0002 Reserved for concepts with insufficient information to code with codable children: Secondary | ICD-10-CM

## 2018-12-30 DIAGNOSIS — Z794 Long term (current) use of insulin: Secondary | ICD-10-CM | POA: Diagnosis not present

## 2018-12-30 DIAGNOSIS — D696 Thrombocytopenia, unspecified: Secondary | ICD-10-CM | POA: Diagnosis not present

## 2018-12-30 DIAGNOSIS — R300 Dysuria: Secondary | ICD-10-CM

## 2018-12-30 DIAGNOSIS — E1165 Type 2 diabetes mellitus with hyperglycemia: Secondary | ICD-10-CM

## 2018-12-30 DIAGNOSIS — D649 Anemia, unspecified: Secondary | ICD-10-CM | POA: Insufficient documentation

## 2018-12-30 DIAGNOSIS — E041 Nontoxic single thyroid nodule: Secondary | ICD-10-CM

## 2018-12-30 DIAGNOSIS — E1142 Type 2 diabetes mellitus with diabetic polyneuropathy: Secondary | ICD-10-CM | POA: Diagnosis not present

## 2018-12-30 DIAGNOSIS — Z79899 Other long term (current) drug therapy: Secondary | ICD-10-CM

## 2018-12-30 DIAGNOSIS — E042 Nontoxic multinodular goiter: Secondary | ICD-10-CM | POA: Insufficient documentation

## 2018-12-30 LAB — GLUCOSE, POCT (MANUAL RESULT ENTRY)
POC Glucose: 396 mg/dl — AB (ref 70–99)
POC Glucose: 333 mg/dl — AB (ref 70–99)

## 2018-12-30 LAB — POCT GLYCOSYLATED HEMOGLOBIN (HGB A1C): HbA1c, POC (controlled diabetic range): 14.4 % — AB (ref 0.0–7.0)

## 2018-12-30 LAB — POCT URINALYSIS DIP (CLINITEK)
Bilirubin, UA: NEGATIVE
Glucose, UA: >=1000 mg/dL — AB
Leukocytes, UA: NEGATIVE
Nitrite, UA: NEGATIVE
Spec Grav, UA: 1.02 (ref 1.010–1.025)
Urobilinogen, UA: 0.2 E.U./dL
pH, UA: 6 (ref 5.0–8.0)

## 2018-12-30 MED ORDER — GABAPENTIN 300 MG PO CAPS: 600.0000 mg | ORAL_CAPSULE | Freq: Every day | ORAL | 0 refills | Status: DC

## 2018-12-30 MED ORDER — NOVOLOG FLEXPEN 100 UNIT/ML ~~LOC~~ SOPN: PEN_INJECTOR | SUBCUTANEOUS | 11 refills | Status: DC

## 2018-12-30 MED ORDER — SODIUM CHLORIDE 0.9 % IV BOLUS: 500.0000 mL | Freq: Once | INTRAVENOUS | Status: DC

## 2018-12-30 MED ORDER — CALCIUM 600 MG PO TABS: 600.0000 mg | ORAL_TABLET | Freq: Two times a day (BID) | ORAL | 6 refills | Status: DC

## 2018-12-30 MED ORDER — SODIUM CHLORIDE 0.9 % IV BOLUS: 1000.0000 mL | Freq: Once | INTRAVENOUS | Status: DC

## 2018-12-30 MED ORDER — LANTUS SOLOSTAR 100 UNIT/ML ~~LOC~~ SOPN: 10.0000 [IU] | PEN_INJECTOR | Freq: Every day | SUBCUTANEOUS | 99 refills | Status: DC

## 2018-12-30 MED ORDER — VITAMIN D (CHOLECALCIFEROL) 10 MCG (400 UNIT) PO CAPS: 800.0000 [IU] | ORAL_CAPSULE | Freq: Every day | ORAL | 6 refills | Status: DC

## 2018-12-30 MED ORDER — BD PEN NEEDLE NANO U/F 32G X 4 MM MISC: 2 refills | Status: DC

## 2018-12-30 MED ORDER — INSULIN ASPART 100 UNIT/ML ~~LOC~~ SOLN
10.0000 [IU] | Freq: Once | SUBCUTANEOUS | Status: AC
  Administered 2018-12-30: 11:00:00 10 [IU] via SUBCUTANEOUS

## 2018-12-30 NOTE — Progress Notes (Signed)
Patient ID: Olivia Williamson, female    DOB: [birth year]  MRN: [id number]  CC: Hospitalization Follow-up (ED)   Subjective: Olivia Williamson is a 73 y.o. female who presents for hosp f/u.  Son, Jesusita OkaDan and interpreter, Mardee PostinWier, from [employer] present.   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  She lives with her daughter and son-in-law.  She tells me that she used to live with her son Jesusita OkaDan who is with her today and he help to take care of her.  Now that she is living with her daughter, she has no one to bring her to appointments or to assist with getting medications.    Seen in ER 3 wks ago for stomach pain and pain down RT leg.  She was found to have elevated blood sugar.  She had been off all medicines for quite a number of months.  CAT scan of the abdomen and pelvis revealed no acute findings but she did have some edema within the cecum versus nondistended cecum.  Out of all med for 1 yr Glucometer out of battery  Endorses polyuria, polydipsia.  No blurred vision.  She still has some radicular symptoms with pain going down the right leg for which she was on gabapentin in the past Loss 16 lbs over past 1 yr. she denies any problems swallowing.  No blood in the urine or stools.  She is moving her bowels okay.  Pain in the epigastric area at times.  Not related to food.  Denies any bloating. States she eats 3 meals a day.  Cooks for herself.  Daughter does not cook.  Son-in-law is home with her during the day  She has history of right thyroid nodule.  It is been over a year since she had thyroid ultrasound for follow-up.  At one point she had seen the endocrinologist.  She denies any palpitations.  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 Take with a full glass of water on an empty stomach. (Patient not taking: Reported on 12/06/2018) 12 tablet 6   Insulin Glargine (LANTUS) 100 UNIT/ML Solostar Pen Inject 28 Units into the skin daily at 10 pm. MUST MAKE APPT FOR FURTHER REFILLS (Patient not taking: Reported on 12/06/2018) 15 pen 0   No current facility-administered medications on file prior to visit.     Allergies  Allergen Reactions   Beef-Derived Products Other (See Comments)    Generalized burning sensation   Eggs Or Egg-Derived Products Other (See Comments)    Generalized burning sensation   Fish Allergy Other (See Comments)    Generalized burning sensation   Fruit &  Vegetable Daily [Nutritional Supplements] Other (See Comments)    Orange causes lower extremity burning    Social History   Socioeconomic History   Marital status: Widowed    Spouse name: Not on file   Number of children: Not on file   Years of education: Not on file   Highest education level: Not on file  Occupational History   Not on file  Social Needs   Financial resource strain: Not on file   Food insecurity    Worry: Not on file    Inability: Not on file   Transportation needs    Medical: Not on file    Non-medical: Not on file  Tobacco Use   Smoking status: Never Smoker    Smokeless tobacco: Never Used  Substance and Sexual Activity   Alcohol use: No   Drug use: No   Sexual activity: Not on file  Lifestyle   Physical activity    Days per week: Not on file    Minutes per session: Not on file   Stress: Not on file  Relationships   Social connections    Talks on phone: Not on file    Gets together: Not on file    Attends religious service: Not on file    Active member of club or organization: Not on file    Attends meetings of clubs or organizations: Not on file    Relationship status: Not on file   Intimate partner violence    Fear of current or ex partner: Not on file    Emotionally abused: Not on file    Physically abused: Not on file    Forced sexual activity: Not on file  Other Topics Concern   Not on file  Social History Narrative   Not on file    Family History  Problem Relation Age of Onset   Heart disease Maternal Uncle 55    Past Surgical History:  Procedure Laterality Date   ABDOMINAL HYSTERECTOMY     CHOLECYSTECTOMY N/A 05/02/2014   Procedure: LAPAROSCOPIC CHOLECYSTECTOMY ;  Surgeon: Coralie Keens, MD;  Location: [facility];  Service: General;  Laterality: N/A;  laparoscopic cholecystectomy   COLONOSCOPY     NO PAST SURGERIES     VIDEO BRONCHOSCOPY Bilateral 01/10/2016   Procedure: VIDEO BRONCHOSCOPY WITHOUT FLUORO;  Surgeon: Collene Gobble, MD;  Location: [facility];  Service: Cardiopulmonary;  Laterality: Bilateral;    ROS: Review of Systems Negative except as stated above  PHYSICAL EXAM: BP (!) 173/79    Pulse 69    Temp 98.5 F (36.9 C) (Oral)    Resp 16    Wt 79 lb (35.8 kg)    LMP 05/11/2016    SpO2 100%    BMI 15.96 kg/m   Wt Readings from Last 3 Encounters:  12/30/18 79 lb (35.8 kg)  07/27/17 95 lb 6.4 oz (43.3 kg)  06/29/17 93 lb 12.8 oz (42.5 kg)    Physical Exam  General appearance -elderly female who appears malnourished with some wasting. Mental status -patient answers questions  appropriately.   Eyes - pupils equal and reactive, extraocular eye movements intact Mouth - mucous membranes moist, pharynx normal without lesions Neck - supple, no significant adenopathy.  Small right thyroid nodule palpated. Chest - clear to auscultation, no wheezes, rales or rhonchi, symmetric air entry Heart - normal rate, regular rhythm, normal S1, S2, no murmurs, rubs, clicks or gallops Abdomen - soft, nontender, nondistended, no masses or organomegaly Extremities -no lower extremity  edema  Results for orders placed or performed in visit on 12/30/18  POCT URINALYSIS DIP (CLINITEK)  Result Value Ref Range   Color, UA yellow yellow   Clarity, UA clear clear   Glucose, UA >=1,000 (A) negative mg/dL   Bilirubin, UA negative negative   Ketones, POC UA trace (5) (A) negative mg/dL   Spec Grav, UA 1.4781.020 2.9561.010 - 1.025   Blood, UA small (A) negative   pH, UA 6.0 5.0 - 8.0   POC PROTEIN,UA trace negative, trace   Urobilinogen, UA 0.2 0.2 or 1.0 E.U./dL   Nitrite, UA Negative Negative   Leukocytes, UA Negative Negative  POCT glycosylated hemoglobin (Hb A1C)  Result Value Ref Range   Hemoglobin A1C     HbA1c POC (<> result, manual entry)     HbA1c, POC (prediabetic range)     HbA1c, POC (controlled diabetic range) 14.4 (A) 0.0 - 7.0 %  POCT glucose (manual entry)  Result Value Ref Range   POC Glucose 396 (A) 70 - 99 mg/dl  POCT glucose (manual entry)  Result Value Ref Range   POC Glucose 333 (A) 70 - 99 mg/dl    CMP Latest Ref Rng & Units 12/06/2018 06/01/2017 04/06/2017  Glucose 70 - 99 mg/dL 213(Y344(H) 865(H141(H) 846(N139(H)  BUN 8 - 23 mg/dL 13 21 62(X32(H)  Creatinine 0.44 - 1.00 mg/dL 5.280.61 4.13(K1.17(H) 4.40(N1.14(H)  Sodium 135 - 145 mmol/L 131(L) 138 136  Potassium 3.5 - 5.1 mmol/L 4.0 4.5 4.5  Chloride 98 - 111 mmol/L 97(L) 97 102  CO2 22 - 32 mmol/L 24 24 25   Calcium 8.9 - 10.3 mg/dL 0.2(V8.6(L) 9.4 9.6  Total Protein 6.5 - 8.1 g/dL 6.6 7.3 7.1  Total Bilirubin 0.3 - 1.2 mg/dL 0.8 0.7 1.0  Alkaline  Phos 38 - 126 U/L 93 126(H) 135(H)  AST 15 - 41 U/L 37 19 19  ALT 0 - 44 U/L 46(H) 21 16   Lipid Panel     Component Value Date/Time   CHOL 151 04/10/2016 1534   TRIG 156.0 (H) 04/10/2016 1534   HDL 83.30 04/10/2016 1534   CHOLHDL 2 04/10/2016 1534   VLDL 31.2 04/10/2016 1534   LDLCALC 36 04/10/2016 1534    CBC    Component Value Date/Time   WBC 4.8 12/06/2018 1408   RBC 4.70 12/06/2018 1408   HGB 9.9 (L) 12/06/2018 1408   HGB 9.4 (L) 10/27/2016 1632   HCT 31.0 (L) 12/06/2018 1408   HCT 29.7 (L) 10/27/2016 1632   PLT 77 (L) 12/06/2018 1408   PLT 118 (L) 10/27/2016 1632   MCV 66.0 (L) 12/06/2018 1408   MCV 68 (L) 10/27/2016 1632   MCH 21.1 (L) 12/06/2018 1408   MCHC 31.9 12/06/2018 1408   RDW 15.0 12/06/2018 1408   RDW 16.9 (H) 10/27/2016 1632   LYMPHSABS 1.4 12/06/2018 1408   MONOABS 0.2 12/06/2018 1408   EOSABS 0.1 12/06/2018 1408   BASOSABS 0.0 12/06/2018 1408    ASSESSMENT AND PLAN: 1. Diabetes mellitus type 2, uncontrolled, with complications (HCC) Patient given 10 units of NovoLog today along with half a liter of fluids. I will get her back on Lantus and NovoLog.  I had the clinical pharmacist meet with her and the son to show them how to do pen injections - POCT glycosylated hemoglobin (Hb A1C) - POCT glucose (manual entry) - insulin aspart (NOVOLOG FLEXPEN) 100 UNIT/ML FlexPen; 5 units with breakfast and dinner  Dispense: 15 mL; Refill: 11 - Insulin Glargine (LANTUS  SOLOSTAR) 100 UNIT/ML Solostar Pen; Inject 10 Units into the skin at bedtime.  Dispense: 5 pen; Refill: PRN - insulin aspart (novoLOG) injection 10 Units - sodium chloride 0.9 % bolus 1,000 mL - Basic Metabolic Panel - POCT glucose (manual entry)  2. Type 2 diabetes mellitus with diabetic polyneuropathy, with long-term current use of insulin (HCC) I have restarted her on gabapentin - gabapentin (NEURONTIN) 300 MG capsule; Take 2 capsules (600 mg total) by mouth at bedtime.  Dispense: 90 capsule;  Refill: 0 - Insulin Pen Needle (BD PEN NEEDLE NANO U/F) 32G X 4 MM MISC; USE TO INJECT INSULIN  Dispense: 100 each; Refill: 2  3. Dysuria - POCT URINALYSIS DIP (CLINITEK)  4. Chronic anemia 5. Thrombocytopenia (HCC) Patient has always had chronic anemia but the thrombocytopenia is new.  Will refer her to hematology - Ambulatory referral to Hematology  6. Thyroid nodule - TSH+T4F+T3Free  Will speak with case worker next week about possibly reporting to Adult Protective Services as family members are not bringing her to get the medical care that she needs.  Patient was given the opportunity to ask questions.  Patient verbalized understanding of the plan and was able to repeat key elements of the plan.   Orders Placed This Encounter  Procedures   TSH+T4F+T3Free   Basic Metabolic Panel   Ambulatory referral to Hematology   POCT URINALYSIS DIP (CLINITEK)   POCT glycosylated hemoglobin (Hb A1C)   POCT glucose (manual entry)   POCT glucose (manual entry)     Requested Prescriptions   Signed Prescriptions Disp Refills   calcium carbonate (OS-CAL) 600 MG tablet 60 tablet 6    Sig: Take 1 tablet (600 mg total) by mouth 2 (two) times daily with a meal.   Vitamin D, Cholecalciferol, 10 MCG (400 UNIT) CAPS 120 capsule 6    Sig: Take 800 Units by mouth daily.   gabapentin (NEURONTIN) 300 MG capsule 90 capsule 0    Sig: Take 2 capsules (600 mg total) by mouth at bedtime.   Insulin Pen Needle (BD PEN NEEDLE NANO U/F) 32G X 4 MM MISC 100 each 2    Sig: USE TO INJECT INSULIN   insulin aspart (NOVOLOG FLEXPEN) 100 UNIT/ML FlexPen 15 mL 11    Sig: 5 units with breakfast and dinner   Insulin Glargine (LANTUS SOLOSTAR) 100 UNIT/ML Solostar Pen 5 pen PRN    Sig: Inject 10 Units into the skin at bedtime.    Return in about 1 month (around 01/30/2019).  Jonah Blueeborah Darin Redmann, MD, FACP

## 2018-12-30 NOTE — Progress Notes (Signed)
Pt states her whole body is in pain   

## 2018-12-30 NOTE — Progress Notes (Signed)
Patient was educated on the use of the Lantus and Novolog insulin pens. Reviewed necessary supplies and operation of the pens. Also reviewed goal blood glucose levels. Patient was able to demonstrate use. All questions and concerns were addressed.

## 2019-01-02 ENCOUNTER — Other Ambulatory Visit: Payer: Self-pay | Admitting: Internal Medicine

## 2019-01-02 DIAGNOSIS — E1165 Type 2 diabetes mellitus with hyperglycemia: Secondary | ICD-10-CM

## 2019-01-02 DIAGNOSIS — R54 Age-related physical debility: Secondary | ICD-10-CM

## 2019-01-02 DIAGNOSIS — IMO0002 Reserved for concepts with insufficient information to code with codable children: Secondary | ICD-10-CM

## 2019-01-02 DIAGNOSIS — Z79899 Other long term (current) drug therapy: Secondary | ICD-10-CM

## 2019-01-02 LAB — BASIC METABOLIC PANEL
BUN/Creatinine Ratio: 19 (ref 12–28)
BUN: 15 mg/dL (ref 8–27)
CO2: 23 mmol/L (ref 20–29)
Calcium: 9.5 mg/dL (ref 8.7–10.3)
Chloride: 95 mmol/L — ABNORMAL LOW (ref 96–106)
Creatinine, Ser: 0.81 mg/dL (ref 0.57–1.00)
GFR calc Af Amer: 85 mL/min/{1.73_m2} (ref >59)
GFR calc non Af Amer: 74 mL/min/{1.73_m2} (ref >59)
Glucose: 362 mg/dL — ABNORMAL HIGH (ref 65–99)
Potassium: 3.9 mmol/L (ref 3.5–5.2)
Sodium: 132 mmol/L — ABNORMAL LOW (ref 134–144)

## 2019-01-02 LAB — TSH+T4F+T3FREE
Free T4: 1.36 ng/dL (ref 0.82–1.77)
T3, Free: 2 pg/mL (ref 2.0–4.4)
TSH: 1.7 u[IU]/mL (ref 0.450–4.500)

## 2019-01-03 ENCOUNTER — Telehealth: Payer: Self-pay

## 2019-01-03 NOTE — Telephone Encounter (Signed)
Attempted to contact patient's son, Crecencio Mc [phone]  to explain home health referral and inquire if patient has a preference for home health agencies. Unable to leave message, voicemail not set up.   Called home # [phone], son in law answered but no interpreter present. He suggested to call Linna Hoff however this CM has not been able to reach [place].   Call placed to [facility], they do not have montagnard interpreter.  Message left for [facility] for [facility] # [phone] requesting call back to this CM for information about interpreter available to assist with call.

## 2019-01-04 NOTE — Telephone Encounter (Signed)
Message received from [employer] for [employer] noting that [place] is able to assist with [facility] interpretation.  Call placed to Vung Ksor # [phone] to assist with contacting patient and explaining reason for home health referral and inquiring if patient has a home health agency preference.   With assistance of Ms Ksor, call placed to patient's home # [phone]. Patient's son in law answered and stated that patient was not there. He could not confirm her birthday other than to state she is "66."  He said that she was staying with her son and he did not know that phone number and he had to leave for work.. He said that he would go to that son's  house and get the number and then contact Ms Ksor.

## 2019-01-06 ENCOUNTER — Telehealth: Payer: Self-pay | Admitting: Hematology

## 2019-01-06 NOTE — Telephone Encounter (Signed)
Received a new hem referral from Dr. Wynetta Emery for chronic anemia/thrombocytopenia. Olivia Williamson has been scheduled to see Olivia Williamson on 9/1 at 1pm. Letter mailed to the pt

## 2019-01-08 ENCOUNTER — Encounter: Payer: Self-pay | Admitting: Hematology

## 2019-01-12 ENCOUNTER — Telehealth: Payer: Self-pay

## 2019-01-12 NOTE — Telephone Encounter (Signed)
Call placed to patient with assistance of [place] for [facility] who was interpreter for [employer].  She confirmed that patient is living with her son Lenoria Farrier) and daughter in law Lorrin Jackson). Address: [address] [place], private home.  Best phone # [phone].  Explained to patient the reason for the referral and she was agreeable to having a home health nurse visit and she had no preference for home health agencies.  [facility] agreed to interpret for the patient with the home health agency if needed   Referral faxed to [place].

## 2019-01-18 ENCOUNTER — Telehealth: Payer: Self-pay

## 2019-01-18 NOTE — Telephone Encounter (Signed)
Message received from [facility] noting that they have not been able to find an interpreter for patient's dialect.   Call returned to [place] and message left noting that the name/phone # for an interpreter is on the bottom of the first page of the referral.

## 2019-01-18 NOTE — Telephone Encounter (Signed)
This CM spoke to [facility]. She explained that [facility], interpreter will charge for services. She is checking other resources for assistance with interpretation so they can assess the patient.  She understands that the interpreter does not need to make a home visit and can interpret via phone while the nurse is in the home.

## 2019-01-25 ENCOUNTER — Inpatient Hospital Stay: Payer: Medicare Other | Attending: Hematology | Admitting: Hematology

## 2019-01-31 ENCOUNTER — Telehealth: Payer: Self-pay

## 2019-01-31 NOTE — Telephone Encounter (Signed)
Message left for [facility] inquiring if she has been able to locate an interpreter so the home health nurse would be able to see the patient/

## 2019-02-02 ENCOUNTER — Encounter: Payer: Self-pay | Admitting: Internal Medicine

## 2019-02-02 ENCOUNTER — Ambulatory Visit: Payer: Medicare Other | Attending: Internal Medicine | Admitting: Internal Medicine

## 2019-02-02 ENCOUNTER — Ambulatory Visit (HOSPITAL_BASED_OUTPATIENT_CLINIC_OR_DEPARTMENT_OTHER): Payer: Medicare Other | Admitting: Pharmacist

## 2019-02-02 ENCOUNTER — Other Ambulatory Visit: Payer: Self-pay

## 2019-02-02 VITALS — BP 153/83 | HR 74 | Temp 98.2°F | Resp 16 | Wt 88.0 lb

## 2019-02-02 DIAGNOSIS — Z1211 Encounter for screening for malignant neoplasm of colon: Secondary | ICD-10-CM

## 2019-02-02 DIAGNOSIS — E1142 Type 2 diabetes mellitus with diabetic polyneuropathy: Secondary | ICD-10-CM | POA: Insufficient documentation

## 2019-02-02 DIAGNOSIS — D696 Thrombocytopenia, unspecified: Secondary | ICD-10-CM | POA: Diagnosis not present

## 2019-02-02 DIAGNOSIS — Z23 Encounter for immunization: Secondary | ICD-10-CM | POA: Diagnosis not present

## 2019-02-02 DIAGNOSIS — D649 Anemia, unspecified: Secondary | ICD-10-CM | POA: Diagnosis not present

## 2019-02-02 DIAGNOSIS — E042 Nontoxic multinodular goiter: Secondary | ICD-10-CM | POA: Insufficient documentation

## 2019-02-02 DIAGNOSIS — E041 Nontoxic single thyroid nodule: Secondary | ICD-10-CM

## 2019-02-02 DIAGNOSIS — E1165 Type 2 diabetes mellitus with hyperglycemia: Secondary | ICD-10-CM

## 2019-02-02 DIAGNOSIS — Z79899 Other long term (current) drug therapy: Secondary | ICD-10-CM | POA: Insufficient documentation

## 2019-02-02 DIAGNOSIS — Z1239 Encounter for other screening for malignant neoplasm of breast: Secondary | ICD-10-CM | POA: Diagnosis not present

## 2019-02-02 DIAGNOSIS — Z794 Long term (current) use of insulin: Secondary | ICD-10-CM | POA: Diagnosis not present

## 2019-02-02 DIAGNOSIS — IMO0002 Reserved for concepts with insufficient information to code with codable children: Secondary | ICD-10-CM

## 2019-02-02 LAB — GLUCOSE, POCT (MANUAL RESULT ENTRY): POC Glucose: 152 mg/dl — AB (ref 70–99)

## 2019-02-02 MED ORDER — GABAPENTIN 300 MG PO CAPS: 600.0000 mg | ORAL_CAPSULE | Freq: Every day | ORAL | 6 refills | Status: DC

## 2019-02-02 NOTE — Progress Notes (Signed)
Patient presents for vaccination against influenza and S. pneumo (Prevnar) per orders of Dr. Wynetta Emery. Consent given. Counseling provided. No contraindications exists. Vaccine administered without incident.

## 2019-02-02 NOTE — Progress Notes (Signed)
Patient ID: Olivia Williamson, female    DOB: 1946/04/18  MRN: [id number]  CC: Diabetes   Subjective: Olivia Williamson is a 73 y.o. female who presents for chronic ds management.  Son-in-law, Gerarda GuntherY [place], is with her and helps with interpretion.  The patient is from [place] but speaks a dialect that the [employer] interpreter does not speak.  So the [employer] interpreter communicated to the son-in-law then the son-in-law would speak in a dialect to the patient that she understands Her concerns today include:  TB (treated through HD, completed treatment 08/2016), DM type 2 with neuropathy, thyroid nodules,andanemia duetoalpha thalassemmia mutation, osteoporosis, lumbar radiculopathy  DM:  Restarted on insulin Lantus and Novolog on last visit. Checking BS in mornings and at nights. Forgot to bring log or glucometer.  Gives a.m BS range in low 100s and in 300s before dinner. Brought insulin pens with her but took a while to confirm how much she is taking.  Finally able to confirm tha she is taking 10 units Lantus a nights.  Taking Novolog 3 units with BF and lunch instead of 5 units. No low BS readings Gained 9 lbs since last visit. -We had been trying to get advance home health nurse to evaluate the home situation but there was some difficulty in locating an interpreter.  Referred to hematology on last visit for chronic anemia and thrombocytopenia but looks like no appointment has been scheduled as yet.  History of thyroid nodule.  Thyroid chemistry was normal on last visit.  It is been a while since she has had thyroid ultrasound.  She denies any compressive symptoms. Patient Active Problem List   Diagnosis Date Noted  . Influenza vaccine needed 02/02/2019  . 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 Lumbar radiculopathy 02/02/2017  . Chronic left-sided thoracic back pain 02/02/2017  . 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 Multinodular goiter 11/01/2016  .  Thyroid nodule 04/13/2016  . Abnormal CT of the chest   . Tuberculosis 01/09/2016  . Protein-calorie malnutrition, severe 12/16/2015  . Generalized weakness 12/15/2015  . GERD (gastroesophageal reflux disease)   . Diabetes mellitus with neurological manifestations [name]      Current Outpatient Medications on File Prior to Visit  Medication Sig Dispense Refill  . alendronate (FOSAMAX) 70 MG tablet Take 1 tablet (70 mg total) by mouth once a week. Take with a full glass of water on an empty stomach. (Patient not taking: Reported on 12/06/2018) 12 tablet 6  . calcium carbonate (OS-CAL) 600 MG tablet Take 1 tablet (600 mg total) by mouth 2 (two) times daily with a meal. 60 tablet 6  . insulin aspart (NOVOLOG FLEXPEN) 100 UNIT/ML FlexPen 5 units with breakfast and dinner 15 mL 11  . Insulin Glargine (LANTUS SOLOSTAR) 100 UNIT/ML Solostar Pen Inject 10 Units into the skin at bedtime. 5 pen PRN  . Insulin Pen Needle (BD PEN NEEDLE NANO U/F) 32G X 4 MM MISC USE TO INJECT INSULIN 100 each 2  . Vitamin D, Cholecalciferol, 10 MCG (400 UNIT) CAPS Take 800 Units by mouth daily. 120 capsule 6   No current facility-administered medications on file prior to visit.     Allergies  Allergen Reactions  . Beef-Derived Products Other (See Comments)    Generalized burning sensation  . Eggs Or Egg-Derived Products Other (See Comments)    Generalized burning sensation  . Fish Allergy Other (See Comments)    Generalized burning sensation  . Fruit &  Vegetable Daily [Nutritional Supplements] Other (See Comments)    Orange causes lower extremity burning    Social History   Socioeconomic History  . Marital status: Widowed    Spouse name: Not on file  . Number of children: Not on file  . Years of education: Not on file  . Highest education level: Not on file  Occupational History  . Not on file  Social Needs  . Financial resource strain: Not on file  . Food insecurity    Worry: Not on file     Inability: Not on file  . Transportation needs    Medical: Not on file    Non-medical: Not on file  Tobacco Use  . Smoking status: Never Smoker  . Smokeless tobacco: Never Used  Substance and Sexual Activity  . Alcohol use: No  . Drug use: No  . Sexual activity: Not on file  Lifestyle  . Physical activity    Days per week: Not on file    Minutes per session: Not on file  . Stress: Not on file  Relationships  . Social Herbalist on phone: Not on file    Gets together: Not on file    Attends religious service: Not on file    Active member of club or organization: Not on file    Attends meetings of clubs or organizations: Not on file    Relationship status: Not on file  . Intimate partner violence    Fear of current or ex partner: Not on file    Emotionally abused: Not on file    Physically abused: Not on file    Forced sexual activity: Not on file  Other Topics Concern  . Not on file  Social History Narrative  . Not on file    Family History  Problem Relation Age of Onset  . Heart disease Maternal Uncle 48    Past Surgical History:  Procedure Laterality Date  . ABDOMINAL HYSTERECTOMY    . CHOLECYSTECTOMY N/A 05/02/2014   Procedure: LAPAROSCOPIC CHOLECYSTECTOMY ;  Surgeon: Coralie Keens, MD;  Location: [place];  Service: General;  Laterality: N/A;  laparoscopic cholecystectomy  . COLONOSCOPY    . NO PAST SURGERIES    . VIDEO BRONCHOSCOPY Bilateral 01/10/2016   Procedure: VIDEO BRONCHOSCOPY WITHOUT FLUORO;  Surgeon: Collene Gobble, MD;  Location: [place];  Service: Cardiopulmonary;  Laterality: Bilateral;    ROS: Review of Systems Negative except as stated above  PHYSICAL EXAM: BP (!) 153/83   Pulse 74   Temp 98.2 F (36.8 C) (Oral)   Resp 16   Wt 88 lb (39.9 kg)   LMP 05/11/2016   SpO2 100%   BMI 17.77 kg/m   Wt Readings from Last 3 Encounters:  02/02/19 88 lb (39.9 kg)  12/30/18 79 lb (35.8 kg)  07/27/17 95 lb 6.4 oz (43.3 kg)     Physical Exam  General appearance -frail elderly female in NAD. Mental status -patient is a poor historian.  It takes a while to get somewhat of an accurate history from her due to the language barrier Mouth - mucous membranes moist, pharynx normal without lesions Neck -enlarge right-sided thyroid nodule that appears unchanged Chest - clear to auscultation, no wheezes, rales or rhonchi, symmetric air entry Heart - normal rate, regular rhythm, normal S1, S2, no murmurs, rubs, clicks or gallops Extremities -no lower extremity edema Diabetic Foot Exam - Simple   Simple Foot Form Visual Inspection No deformities, no ulcerations,  no other skin breakdown bilaterally: Yes Sensation Testing Intact to touch and monofilament testing bilaterally: Yes Pulse Check Posterior Tibialis and Dorsalis pulse intact bilaterally: Yes Comments      CMP Latest Ref Rng & Units 12/30/2018 12/06/2018 06/01/2017  Glucose 65 - 99 mg/dL 161(W362(H) 960(A344(H) 540(J141(H)  BUN 8 - 27 mg/dL 15 13 21   Creatinine 0.57 - 1.00 mg/dL 8.110.81 9.140.61 7.82(N1.17(H)  Sodium 134 - 144 mmol/L 132(L) 131(L) 138  Potassium 3.5 - 5.2 mmol/L 3.9 4.0 4.5  Chloride 96 - 106 mmol/L 95(L) 97(L) 97  CO2 20 - 29 mmol/L 23 24 24   Calcium 8.7 - 10.3 mg/dL 9.5 5.6(O8.6(L) 9.4  Total Protein 6.5 - 8.1 g/dL - 6.6 7.3  Total Bilirubin 0.3 - 1.2 mg/dL - 0.8 0.7  Alkaline Phos 38 - 126 U/L - 93 126(H)  AST 15 - 41 U/L - 37 19  ALT 0 - 44 U/L - 46(H) 21   Lipid Panel     Component Value Date/Time   CHOL 151 04/10/2016 1534   TRIG 156.0 (H) 04/10/2016 1534   HDL 83.30 04/10/2016 1534   CHOLHDL 2 04/10/2016 1534   VLDL 31.2 04/10/2016 1534   LDLCALC 36 04/10/2016 1534    CBC    Component Value Date/Time   WBC 4.8 12/06/2018 1408   RBC 4.70 12/06/2018 1408   HGB 9.9 (L) 12/06/2018 1408   HGB 9.4 (L) 10/27/2016 1632   HCT 31.0 (L) 12/06/2018 1408   HCT 29.7 (L) 10/27/2016 1632   PLT 77 (L) 12/06/2018 1408   PLT 118 (L) 10/27/2016 1632   MCV 66.0 (L)  12/06/2018 1408   MCV 68 (L) 10/27/2016 1632   MCH 21.1 (L) 12/06/2018 1408   MCHC 31.9 12/06/2018 1408   RDW 15.0 12/06/2018 1408   RDW 16.9 (H) 10/27/2016 1632   LYMPHSABS 1.4 12/06/2018 1408   MONOABS 0.2 12/06/2018 1408   EOSABS 0.1 12/06/2018 1408   BASOSABS 0.0 12/06/2018 1408    ASSESSMENT AND PLAN: 1. Diabetes mellitus type 2, uncontrolled, with complications (HCC) Continue Lantus 10 units daily.  Advised patient to take the NovoLog as 5 units with breakfast and dinner.  Encourage the son-in-law to help her keep a log of her blood sugar readings and bring them in about 1 month when she comes to see the clinical pharmacist for further evaluation and titration of medications. - POCT glucose (manual entry) - Ambulatory referral to Ophthalmology - Microalbumin / creatinine urine ratio - gabapentin (NEURONTIN) 300 MG capsule; Take 2 capsules (600 mg total) by mouth at bedtime.  Dispense: 60 capsule; Refill: 6  2. Influenza vaccine needed Given  3. Need for vaccination against Streptococcus pneumoniae using pneumococcal conjugate vaccine 13 Given  4. Breast cancer screening - MM Digital Screening; Future  5. Screening for colon cancer - Ambulatory referral to Gastroenterology  6. Thyroid nodule - US Soft Tissue Head/Neck; Future     Patient was given the opportunity to ask questions.  Patient verbalized understanding of the plan and was able to repeat key elements of the plan.  [employer] interpreter used during this encounter. #[id number]   Orders Placed This Encounter  Procedures  . MM Digital Screening  . US Soft Tissue Head/Neck  . Microalbumin / creatinine urine ratio  . Ambulatory referral to Ophthalmology  . Ambulatory referral to Gastroenterology  . POCT glucose (manual entry)     Requested Prescriptions   Signed Prescriptions Disp Refills  . gabapentin (NEURONTIN) 300 MG capsule 60 capsule  6    Sig: Take 2 capsules (600 mg total) by mouth at bedtime.     Return in about 10 weeks (around 04/13/2019).  Jonah Blue, MD, FACP

## 2019-02-02 NOTE — Patient Instructions (Addendum)
Give appointment for to see the clinical pharmacist in 6 weeks for recheck on her diabetes.  Pneumococcal Conjugate Vaccine (PCV13): What You Need to Know 1. Why get vaccinated? Pneumococcal conjugate vaccine (PCV13) can prevent pneumococcal disease. Pneumococcal disease refers to any illness caused by pneumococcal bacteria. These bacteria can cause many types of illnesses, including pneumonia, which is an infection of the lungs. Pneumococcal bacteria are one of the most common causes of pneumonia. Besides pneumonia, pneumococcal bacteria can also cause:  Ear infections  Sinus infections  Meningitis (infection of the tissue covering the brain and spinal cord)  Bacteremia (bloodstream infection) Anyone can get pneumococcal disease, but children under 63 years of age, people with certain medical conditions, adults 65 years or older, and cigarette smokers are at the highest risk. Most pneumococcal infections are mild. However, some can result in long-term problems, such as brain damage or hearing loss. Meningitis, bacteremia, and pneumonia caused by pneumococcal disease can be fatal. 2. PCV13 PCV13 protects against 13 types of bacteria that cause pneumococcal disease. Infants and young children usually need 4 doses of pneumococcal conjugate vaccine, at 2, 4, 6, and 70-6 months of age. In some cases, a child might need fewer than 4 doses to complete PCV13 vaccination. A dose of PCV23 vaccine is also recommended for anyone 2 years or older with certain medical conditions if they did not already receive PCV13. This vaccine may be given to adults 65 years or older based on discussions between the patient and health care provider. 3. Talk with your health care provider Tell your vaccine provider if the person getting the vaccine:  Has had an allergic reaction after a previous dose of PCV13, to an earlier pneumococcal conjugate vaccine known as PCV7, or to any vaccine containing diphtheria toxoid  (for example, DTaP), or has any severe, life-threatening allergies.  In some cases, your health care provider may decide to postpone PCV13 vaccination to a future visit. People with minor illnesses, such as a cold, may be vaccinated. People who are moderately or severely ill should usually wait until they recover before getting PCV13. Your health care provider can give you more information. 4. Risks of a vaccine reaction  Redness, swelling, pain, or tenderness where the shot is given, and fever, loss of appetite, fussiness (irritability), feeling tired, headache, and chills can happen after PCV13. Young children may be at increased risk for seizures caused by fever after PCV13 if it is administered at the same time as inactivated influenza vaccine. Ask your health care provider for more information. People sometimes faint after medical procedures, including vaccination. Tell your provider if you feel dizzy or have vision changes or ringing in the ears. As with any medicine, there is a very remote chance of a vaccine causing a severe allergic reaction, other serious injury, or death. 5. What if there is a serious problem? An allergic reaction could occur after the vaccinated person leaves the clinic. If you see signs of a severe allergic reaction (hives, swelling of the face and throat, difficulty breathing, a fast heartbeat, dizziness, or weakness), call 9-1-1 and get the person to the nearest hospital. For other signs that concern you, call your health care provider. Adverse reactions should be reported to the Vaccine Adverse Event Reporting System (VAERS). Your health care provider will usually file this report, or you can do it yourself. Visit the VAERS website at www.vaers.LAgents.no or call 203-230-6517. VAERS is only for reporting reactions, and VAERS staff do not give medical advice. 6.  The National Vaccine Injury Compensation Program The National Vaccine Injury Compensation Program (VICP) is a  federal program that was created to compensate people who may have been injured by certain vaccines. Visit the VICP website at GoldCloset.com.ee or call 3127955133 to learn about the program and about filing a claim. There is a time limit to file a claim for compensation. 7. How can I learn more?  Ask your health care provider.  Call your local or state health department.  Contact the Centers for Disease Control and Prevention (CDC): ? Call 605-566-0049 (1-800-CDC-INFO) or ? Visit CDC's website at http://hunter.com/ Vaccine Information Statement PCV13 Vaccine (03/23/2018) This information is not intended to replace advice given to you by your health care provider. Make sure you discuss any questions you have with your health care provider. Document Released: 03/08/2006 Document Revised: 08/30/2018 Document Reviewed: 12/21/2017 Elsevier Patient Education  [place].    Influenza Virus Vaccine injection (Fluarix) What is this medicine? INFLUENZA VIRUS VACCINE (in floo EN zuh VAHY ruhs vak SEEN) helps to reduce the risk of getting influenza also known as the flu. This medicine may be used for other purposes; ask your health care provider or pharmacist if you have questions. COMMON BRAND NAME(S): Fluarix, Fluzone What should I tell my health care provider before I take this medicine? They need to know if you have any of these conditions:  bleeding disorder like hemophilia  fever or infection  Guillain-Barre syndrome or other neurological problems  immune system problems  infection with the human immunodeficiency virus (HIV) or AIDS  low blood platelet counts  multiple sclerosis  an unusual or allergic reaction to influenza virus vaccine, eggs, chicken proteins, latex, gentamicin, other medicines, foods, dyes or preservatives  pregnant or trying to get pregnant  breast-feeding How should I use this medicine? This vaccine is for injection into a  muscle. It is given by a health care professional. A copy of Vaccine Information Statements will be given before each vaccination. Read this sheet carefully each time. The sheet may change frequently. Talk to your pediatrician regarding the use of this medicine in children. Special care may be needed. Overdosage: If you think you have taken too much of this medicine contact a poison control center or emergency room at once. NOTE: This medicine is only for you. Do not share this medicine with others. What if I miss a dose? This does not apply. What may interact with this medicine?  chemotherapy or radiation therapy  medicines that lower your immune system like etanercept, anakinra, infliximab, and adalimumab  medicines that treat or prevent blood clots like warfarin  phenytoin  steroid medicines like prednisone or cortisone  theophylline  vaccines This list may not describe all possible interactions. Give your health care provider a list of all the medicines, herbs, non-prescription drugs, or dietary supplements you use. Also tell them if you smoke, drink alcohol, or use illegal drugs. Some items may interact with your medicine. What should I watch for while using this medicine? Report any side effects that do not go away within 3 days to your doctor or health care professional. Call your health care provider if any unusual symptoms occur within 6 weeks of receiving this vaccine. You may still catch the flu, but the illness is not usually as bad. You cannot get the flu from the vaccine. The vaccine will not protect against colds or other illnesses that may cause fever. The vaccine is needed every year. What side effects may I  notice from receiving this medicine? Side effects that you should report to your doctor or health care professional as soon as possible:  allergic reactions like skin rash, itching or hives, swelling of the face, lips, or tongue Side effects that usually do not  require medical attention (report to your doctor or health care professional if they continue or are bothersome):  fever  headache  muscle aches and pains  pain, tenderness, redness, or swelling at site where injected  weak or tired This list may not describe all possible side effects. Call your doctor for medical advice about side effects. You may report side effects to FDA at 1-800-FDA-1088. Where should I keep my medicine? This vaccine is only given in a clinic, pharmacy, doctor's office, or other health care setting and will not be stored at home. NOTE: This sheet is a summary. It may not cover all possible information. If you have questions about this medicine, talk to your doctor, pharmacist, or health care provider.  2020 Elsevier/Gold Standard (2007-12-07 09:30:40)

## 2019-02-03 LAB — MICROALBUMIN / CREATININE URINE RATIO
Creatinine, Urine: 74.6 mg/dL
Microalb/Creat Ratio: 712 mg/g creat — ABNORMAL HIGH (ref 0–29)
Microalbumin, Urine: 531.1 ug/mL

## 2019-02-05 ENCOUNTER — Encounter: Payer: Self-pay | Admitting: Internal Medicine

## 2019-02-05 ENCOUNTER — Other Ambulatory Visit: Payer: Self-pay | Admitting: Internal Medicine

## 2019-02-05 DIAGNOSIS — R809 Proteinuria, unspecified: Secondary | ICD-10-CM | POA: Insufficient documentation

## 2019-02-05 MED ORDER — LISINOPRIL 5 MG PO TABS: 5.0000 mg | ORAL_TABLET | Freq: Every day | ORAL | 3 refills | Status: DC

## 2019-02-08 ENCOUNTER — Ambulatory Visit (HOSPITAL_COMMUNITY): Admission: RE | Admit: 2019-02-08 | Payer: Medicare Other | Source: Ambulatory Visit

## 2019-02-08 ENCOUNTER — Telehealth: Payer: Self-pay

## 2019-02-08 NOTE — Telephone Encounter (Signed)
Call placed to Rance Muir, [place] inquiring if she has identified an interpreter to assist with the SN home visit  Message left with call back requested to this CM # [phone].

## 2019-02-09 ENCOUNTER — Telehealth: Payer: Self-pay

## 2019-02-09 NOTE — Telephone Encounter (Signed)
This CM spoke to Rance Muir, [facility].  She explained that they have not been able to identify a rhade interpreter to assist with home visit.    As per Driscilla Grammes for [facility], they  may be able to locate and pay for a rhade interpreter for one home visit with the home health agency. . This was explained to Margarita Grizzle who said that she would need to check with management to determine if they are willing to accept the patient for one visit as they could not afford to pay for interpreter services if it is determined that more than one visit is needed.

## 2019-02-23 NOTE — Telephone Encounter (Signed)
Call placed to [facility] to inquire if they are able to accept the referral for a one time visit. Message left with call back requested to this CM

## 2019-02-28 ENCOUNTER — Telehealth: Payer: Self-pay

## 2019-02-28 NOTE — Telephone Encounter (Signed)
Message received from Reche Dixon, [place] noting that they are not able to accept the patient for one time visit.

## 2019-03-01 ENCOUNTER — Telehealth: Payer: Self-pay

## 2019-03-01 NOTE — Telephone Encounter (Signed)
Call placed to [facility] # [phone]  to inquire if they are able to provide home health services for patient.  Spoke to [place], who stated that they are not able to accept the referral.   Call placed to [facility] # [phone], spoke to [place] who stated that they have a resource for [place] rhade interpretation.  She requested that referral be faxed for review # [phone]. Informed her that referral would be faxed tomorrow.

## 2019-03-02 NOTE — Telephone Encounter (Signed)
Referral to be faxed to [facility] when new referral received.

## 2019-03-06 ENCOUNTER — Other Ambulatory Visit: Payer: Self-pay | Admitting: Internal Medicine

## 2019-03-06 DIAGNOSIS — Z79899 Other long term (current) drug therapy: Secondary | ICD-10-CM

## 2019-03-06 DIAGNOSIS — E1165 Type 2 diabetes mellitus with hyperglycemia: Secondary | ICD-10-CM

## 2019-03-06 DIAGNOSIS — IMO0002 Reserved for concepts with insufficient information to code with codable children: Secondary | ICD-10-CM

## 2019-03-07 ENCOUNTER — Telehealth: Payer: Self-pay

## 2019-03-07 NOTE — Telephone Encounter (Signed)
Referral for home health faxed to [facility].for review.

## 2019-03-08 ENCOUNTER — Telehealth: Payer: Self-pay

## 2019-03-08 NOTE — Telephone Encounter (Signed)
Message received from [facility] noting that they are not able to accept the referral due to staffing. They also do not have an interpreter available and are not able to bill medicare part B for the services.

## 2019-03-16 ENCOUNTER — Encounter (HOSPITAL_COMMUNITY): Payer: Self-pay | Admitting: Emergency Medicine

## 2019-03-16 ENCOUNTER — Emergency Department (HOSPITAL_COMMUNITY): Payer: Medicare Other

## 2019-03-16 ENCOUNTER — Inpatient Hospital Stay (HOSPITAL_COMMUNITY): Payer: Medicare Other

## 2019-03-16 ENCOUNTER — Other Ambulatory Visit: Payer: Self-pay

## 2019-03-16 ENCOUNTER — Inpatient Hospital Stay (HOSPITAL_COMMUNITY)
Admission: EM | Admit: 2019-03-16 | Discharge: 2019-03-19 | DRG: 644 | Disposition: A | Discharge status: Home / Self Care | Payer: Medicare Other | Attending: Internal Medicine | Admitting: Internal Medicine

## 2019-03-16 ENCOUNTER — Ambulatory Visit: Payer: Medicare Other | Admitting: Pharmacist

## 2019-03-16 DIAGNOSIS — I119 Hypertensive heart disease without heart failure: Secondary | ICD-10-CM | POA: Diagnosis present

## 2019-03-16 DIAGNOSIS — J189 Pneumonia, unspecified organism: Secondary | ICD-10-CM | POA: Diagnosis present

## 2019-03-16 DIAGNOSIS — Z79899 Other long term (current) drug therapy: Secondary | ICD-10-CM | POA: Diagnosis not present

## 2019-03-16 DIAGNOSIS — Z20828 Contact with and (suspected) exposure to other viral communicable diseases: Secondary | ICD-10-CM | POA: Diagnosis present

## 2019-03-16 DIAGNOSIS — K219 Gastro-esophageal reflux disease without esophagitis: Secondary | ICD-10-CM | POA: Diagnosis present

## 2019-03-16 DIAGNOSIS — R1110 Vomiting, unspecified: Secondary | ICD-10-CM

## 2019-03-16 DIAGNOSIS — E119 Type 2 diabetes mellitus without complications: Secondary | ICD-10-CM | POA: Diagnosis not present

## 2019-03-16 DIAGNOSIS — K449 Diaphragmatic hernia without obstruction or gangrene: Secondary | ICD-10-CM | POA: Diagnosis not present

## 2019-03-16 DIAGNOSIS — G8929 Other chronic pain: Secondary | ICD-10-CM | POA: Diagnosis present

## 2019-03-16 DIAGNOSIS — E871 Hypo-osmolality and hyponatremia: Secondary | ICD-10-CM | POA: Diagnosis not present

## 2019-03-16 DIAGNOSIS — E2740 Unspecified adrenocortical insufficiency: Secondary | ICD-10-CM | POA: Diagnosis present

## 2019-03-16 DIAGNOSIS — N390 Urinary tract infection, site not specified: Secondary | ICD-10-CM | POA: Diagnosis present

## 2019-03-16 DIAGNOSIS — Z8611 Personal history of tuberculosis: Secondary | ICD-10-CM | POA: Diagnosis not present

## 2019-03-16 DIAGNOSIS — Z794 Long term (current) use of insulin: Secondary | ICD-10-CM

## 2019-03-16 DIAGNOSIS — Z9049 Acquired absence of other specified parts of digestive tract: Secondary | ICD-10-CM | POA: Diagnosis not present

## 2019-03-16 DIAGNOSIS — Z90710 Acquired absence of both cervix and uterus: Secondary | ICD-10-CM | POA: Diagnosis not present

## 2019-03-16 DIAGNOSIS — E1149 Type 2 diabetes mellitus with other diabetic neurological complication: Secondary | ICD-10-CM | POA: Diagnosis present

## 2019-03-16 DIAGNOSIS — Z91013 Allergy to seafood: Secondary | ICD-10-CM

## 2019-03-16 DIAGNOSIS — D696 Thrombocytopenia, unspecified: Secondary | ICD-10-CM | POA: Diagnosis present

## 2019-03-16 DIAGNOSIS — E860 Dehydration: Secondary | ICD-10-CM | POA: Diagnosis not present

## 2019-03-16 DIAGNOSIS — D582 Other hemoglobinopathies: Secondary | ICD-10-CM | POA: Diagnosis present

## 2019-03-16 DIAGNOSIS — M19072 Primary osteoarthritis, left ankle and foot: Secondary | ICD-10-CM | POA: Diagnosis not present

## 2019-03-16 DIAGNOSIS — R109 Unspecified abdominal pain: Secondary | ICD-10-CM | POA: Diagnosis not present

## 2019-03-16 DIAGNOSIS — J181 Lobar pneumonia, unspecified organism: Secondary | ICD-10-CM | POA: Diagnosis not present

## 2019-03-16 DIAGNOSIS — R05 Cough: Secondary | ICD-10-CM | POA: Diagnosis not present

## 2019-03-16 DIAGNOSIS — D649 Anemia, unspecified: Secondary | ICD-10-CM

## 2019-03-16 DIAGNOSIS — Z91018 Allergy to other foods: Secondary | ICD-10-CM | POA: Diagnosis not present

## 2019-03-16 DIAGNOSIS — Z8249 Family history of ischemic heart disease and other diseases of the circulatory system: Secondary | ICD-10-CM | POA: Diagnosis not present

## 2019-03-16 DIAGNOSIS — R112 Nausea with vomiting, unspecified: Secondary | ICD-10-CM

## 2019-03-16 DIAGNOSIS — E222 Syndrome of inappropriate secretion of antidiuretic hormone: Secondary | ICD-10-CM | POA: Diagnosis present

## 2019-03-16 LAB — COMPREHENSIVE METABOLIC PANEL
ALT: 23 U/L (ref 0–44)
AST: 26 U/L (ref 15–41)
Albumin: 3.3 g/dL — ABNORMAL LOW (ref 3.5–5.0)
Alkaline Phosphatase: 61 U/L (ref 38–126)
Anion gap: 11 (ref 5–15)
BUN: 13 mg/dL (ref 8–23)
CO2: 24 mmol/L (ref 22–32)
Calcium: 8.8 mg/dL — ABNORMAL LOW (ref 8.9–10.3)
Chloride: 90 mmol/L — ABNORMAL LOW (ref 98–111)
Creatinine, Ser: 0.6 mg/dL (ref 0.44–1.00)
GFR calc Af Amer: >60 mL/min (ref >60)
GFR calc non Af Amer: >60 mL/min (ref >60)
Glucose, Bld: 129 mg/dL — ABNORMAL HIGH (ref 70–99)
Potassium: 4.3 mmol/L (ref 3.5–5.1)
Sodium: 125 mmol/L — ABNORMAL LOW (ref 135–145)
Total Bilirubin: 0.7 mg/dL (ref 0.3–1.2)
Total Protein: 6.7 g/dL (ref 6.5–8.1)

## 2019-03-16 LAB — CBC
HCT: 24.7 % — ABNORMAL LOW (ref 36.0–46.0)
Hemoglobin: 8 g/dL — ABNORMAL LOW (ref 12.0–15.0)
MCH: 20.9 pg — ABNORMAL LOW (ref 26.0–34.0)
MCHC: 32.4 g/dL (ref 30.0–36.0)
MCV: 64.7 fL — ABNORMAL LOW (ref 80.0–100.0)
Platelets: 87 10*3/uL — ABNORMAL LOW (ref 150–400)
RBC: 3.82 MIL/uL — ABNORMAL LOW (ref 3.87–5.11)
RDW: 14.8 % (ref 11.5–15.5)
WBC: 4.7 10*3/uL (ref 4.0–10.5)
nRBC: 0 % (ref 0.0–0.2)

## 2019-03-16 LAB — URINALYSIS, ROUTINE W REFLEX MICROSCOPIC
Bilirubin Urine: NEGATIVE
Glucose, UA: 50 mg/dL — AB
Ketones, ur: NEGATIVE mg/dL
Leukocytes,Ua: NEGATIVE
Nitrite: NEGATIVE
Protein, ur: 100 mg/dL — AB
Specific Gravity, Urine: 1.012 (ref 1.005–1.030)
pH: 8 (ref 5.0–8.0)

## 2019-03-16 LAB — BASIC METABOLIC PANEL
Anion gap: 13 (ref 5–15)
Anion gap: 11 (ref 5–15)
BUN: 13 mg/dL (ref 8–23)
BUN: 15 mg/dL (ref 8–23)
CO2: 19 mmol/L — ABNORMAL LOW (ref 22–32)
CO2: 20 mmol/L — ABNORMAL LOW (ref 22–32)
Calcium: 8.1 mg/dL — ABNORMAL LOW (ref 8.9–10.3)
Calcium: 8 mg/dL — ABNORMAL LOW (ref 8.9–10.3)
Chloride: 90 mmol/L — ABNORMAL LOW (ref 98–111)
Chloride: 92 mmol/L — ABNORMAL LOW (ref 98–111)
Creatinine, Ser: 0.79 mg/dL (ref 0.44–1.00)
Creatinine, Ser: 0.66 mg/dL (ref 0.44–1.00)
GFR calc Af Amer: >60 mL/min (ref >60)
GFR calc Af Amer: >60 mL/min (ref >60)
GFR calc non Af Amer: >60 mL/min (ref >60)
GFR calc non Af Amer: >60 mL/min (ref >60)
Glucose, Bld: 172 mg/dL — ABNORMAL HIGH (ref 70–99)
Glucose, Bld: 171 mg/dL — ABNORMAL HIGH (ref 70–99)
Potassium: 3.9 mmol/L (ref 3.5–5.1)
Potassium: 4.2 mmol/L (ref 3.5–5.1)
Sodium: 122 mmol/L — ABNORMAL LOW (ref 135–145)
Sodium: 123 mmol/L — ABNORMAL LOW (ref 135–145)

## 2019-03-16 LAB — GLUCOSE, CAPILLARY: Glucose-Capillary: 183 mg/dL — ABNORMAL HIGH (ref 70–99)

## 2019-03-16 LAB — TYPE AND SCREEN
ABO/RH(D): O POS
Antibody Screen: NEGATIVE

## 2019-03-16 LAB — OSMOLALITY: Osmolality: 267 mOsm/kg — ABNORMAL LOW (ref 275–295)

## 2019-03-16 LAB — ABO/RH: ABO/RH(D): O POS

## 2019-03-16 LAB — PROCALCITONIN: Procalcitonin: <0.1 ng/mL

## 2019-03-16 LAB — POC OCCULT BLOOD, ED: Fecal Occult Bld: NEGATIVE

## 2019-03-16 LAB — LIPASE, BLOOD: Lipase: 36 U/L (ref 11–51)

## 2019-03-16 LAB — HIV ANTIBODY (ROUTINE TESTING W REFLEX): HIV Screen 4th Generation wRfx: NONREACTIVE

## 2019-03-16 LAB — SODIUM, URINE, RANDOM: Sodium, Ur: 142 mmol/L

## 2019-03-16 LAB — SARS CORONAVIRUS 2 (TAT 6-24 HRS): SARS Coronavirus 2: NEGATIVE

## 2019-03-16 LAB — OSMOLALITY, URINE: Osmolality, Ur: 505 mOsm/kg (ref 300–900)

## 2019-03-16 MED ORDER — ONDANSETRON HCL 4 MG/2ML IJ SOLN
4.0000 mg | Freq: Four times a day (QID) | INTRAMUSCULAR | Status: DC | PRN
  Administered 2019-03-16 – 2019-03-17 (×3): 4 mg via INTRAVENOUS
  Filled 2019-03-16 (×3): qty 2

## 2019-03-16 MED ORDER — ACETAMINOPHEN 650 MG RE SUPP: 650.0000 mg | Freq: Four times a day (QID) | RECTAL | Status: DC | PRN

## 2019-03-16 MED ORDER — DEXTROSE 5 % IV SOLN
250.0000 mg | INTRAVENOUS | Status: DC
  Administered 2019-03-17: 250 mg via INTRAVENOUS
  Filled 2019-03-16 (×2): qty 250

## 2019-03-16 MED ORDER — ACETAMINOPHEN 325 MG PO TABS
650.0000 mg | ORAL_TABLET | Freq: Four times a day (QID) | ORAL | Status: DC | PRN
  Administered 2019-03-17 – 2019-03-19 (×3): 650 mg via ORAL
  Filled 2019-03-16 (×3): qty 2

## 2019-03-16 MED ORDER — ONDANSETRON HCL 4 MG/2ML IJ SOLN
4.0000 mg | Freq: Once | INTRAMUSCULAR | Status: AC
  Administered 2019-03-16: 4 mg via INTRAVENOUS
  Filled 2019-03-16: qty 2

## 2019-03-16 MED ORDER — GABAPENTIN 300 MG PO CAPS
600.0000 mg | ORAL_CAPSULE | Freq: Every day | ORAL | Status: DC
  Administered 2019-03-16 – 2019-03-18 (×3): 600 mg via ORAL
  Filled 2019-03-16 (×3): qty 2

## 2019-03-16 MED ORDER — CALCIUM CARBONATE ANTACID 500 MG PO CHEW
500.0000 mg | CHEWABLE_TABLET | Freq: Two times a day (BID) | ORAL | Status: DC
  Administered 2019-03-17 – 2019-03-19 (×5): 500 mg via ORAL
  Filled 2019-03-16 (×4): qty 3

## 2019-03-16 MED ORDER — SODIUM CHLORIDE 0.9% FLUSH
3.0000 mL | Freq: Once | INTRAVENOUS | Status: AC
  Administered 2019-03-16: 3 mL via INTRAVENOUS

## 2019-03-16 MED ORDER — SODIUM CHLORIDE 0.9 % IV SOLN
1.0000 g | INTRAVENOUS | Status: DC
  Administered 2019-03-16 – 2019-03-17 (×2): 1 g via INTRAVENOUS
  Filled 2019-03-16 (×2): qty 10

## 2019-03-16 MED ORDER — LEVOFLOXACIN IN D5W 750 MG/150ML IV SOLN: 750.0000 mg | Freq: Once | INTRAVENOUS | Status: DC

## 2019-03-16 MED ORDER — MORPHINE SULFATE (PF) 4 MG/ML IV SOLN
4.0000 mg | Freq: Once | INTRAVENOUS | Status: AC
  Administered 2019-03-16: 4 mg via INTRAVENOUS
  Filled 2019-03-16: qty 1

## 2019-03-16 MED ORDER — SODIUM CHLORIDE 0.9 % IV SOLN
INTRAVENOUS | Status: DC
  Administered 2019-03-16 – 2019-03-17 (×3): via INTRAVENOUS

## 2019-03-16 MED ORDER — LABETALOL HCL 5 MG/ML IV SOLN
5.0000 mg | INTRAVENOUS | Status: DC | PRN
  Administered 2019-03-18: 5 mg via INTRAVENOUS
  Filled 2019-03-16: qty 4

## 2019-03-16 MED ORDER — LISINOPRIL 5 MG PO TABS
5.0000 mg | ORAL_TABLET | Freq: Every day | ORAL | Status: DC
  Administered 2019-03-16 – 2019-03-19 (×4): 5 mg via ORAL
  Filled 2019-03-16 (×4): qty 1

## 2019-03-16 MED ORDER — SODIUM CHLORIDE 0.9 % IV BOLUS
1000.0000 mL | Freq: Once | INTRAVENOUS | Status: AC
  Administered 2019-03-16: 1000 mL via INTRAVENOUS

## 2019-03-16 MED ORDER — IOHEXOL 300 MG/ML  SOLN
100.0000 mL | Freq: Once | INTRAMUSCULAR | Status: AC | PRN
  Administered 2019-03-16: 100 mL via INTRAVENOUS

## 2019-03-16 MED ORDER — MORPHINE SULFATE (PF) 4 MG/ML IV SOLN
4.0000 mg | INTRAVENOUS | Status: DC | PRN
  Administered 2019-03-16: 4 mg via INTRAVENOUS
  Filled 2019-03-16: qty 1

## 2019-03-16 MED ORDER — INSULIN GLARGINE 100 UNIT/ML ~~LOC~~ SOLN
10.0000 [IU] | Freq: Every day | SUBCUTANEOUS | Status: DC
  Administered 2019-03-16 – 2019-03-18 (×3): 10 [IU] via SUBCUTANEOUS
  Filled 2019-03-16 (×4): qty 0.1

## 2019-03-16 MED ORDER — INSULIN ASPART 100 UNIT/ML ~~LOC~~ SOLN
0.0000 [IU] | Freq: Three times a day (TID) | SUBCUTANEOUS | Status: DC
  Administered 2019-03-17 – 2019-03-18 (×2): 1 [IU] via SUBCUTANEOUS
  Administered 2019-03-18 (×2): 2 [IU] via SUBCUTANEOUS

## 2019-03-16 MED ORDER — LEVOFLOXACIN 750 MG PO TABS
750.0000 mg | ORAL_TABLET | Freq: Once | ORAL | Status: AC
  Administered 2019-03-16: 750 mg via ORAL
  Filled 2019-03-16: qty 1

## 2019-03-16 MED ORDER — SODIUM CHLORIDE 0.9 % IV SOLN
500.0000 mg | Freq: Once | INTRAVENOUS | Status: AC
  Administered 2019-03-16: 500 mg via INTRAVENOUS
  Filled 2019-03-16: qty 500

## 2019-03-16 NOTE — ED Notes (Signed)
ED TO INPATIENT HANDOFF REPORT  ED Nurse Name and Phone #: Alycia RossettiRyan [phone]  S Name/Age/Gender Olivia BuddMoihh Ciliberto 73 y.o. female Room/Bed: 033C/033C  Code Status   Code Status: Full Code  Home/SNF/Other Home Patient oriented to: self, place, time and situation Is this baseline? Yes   Triage Complete: Triage complete  Chief Complaint sick  Triage Note Patient arrived with EMS from home reports mid abdominal pain with emesis onset yesterday , patient added chronic left foot/ankle pain for several years , [place] interpreter service utilized at triage .    Allergies Allergies  Allergen Reactions  . Beef-Derived Products Other (See Comments)    Generalized burning sensation  . Eggs Or Egg-Derived Products Other (See Comments)    Generalized burning sensation  . Fish Allergy Other (See Comments)    Generalized burning sensation  . Fruit & Vegetable Daily [Nutritional Supplements] Other (See Comments)    Orange causes lower extremity burning    Level of Care/Admitting Diagnosis ED Disposition    ED Disposition Condition Comment   Admit  [facility] [[id number][facility]  Level of Care: Med-Surg [16]  Covid Evaluation: Asymptomatic Screening Protocol (No Symptoms)  Diagnosis: Community acquired pneumonia [[id number]][[id number]]  Admitting Physician: Reymundo PollGUILLOUD, CAROLYN [[id number]]  Attending Physician: Reymundo PollGUILLOUD, CAROLYN [[id number]]  Estimated length of stay: past midnight tomorrow  Certification:: I certify this patient will need inpatient services for at least 2 midnights  PT Class (Do Not Modify): Inpatient [101]  PT Acc Code (Do Not Modify): Private [1]       B Medical/Surgery History Past Medical History:  Diagnosis Date  . Arthritis   . Diabetes mellitus without complication (HCC)    stopped DM meds 2004  . GERD (gastroesophageal reflux disease)   . Hypertension    meds stopped 1 week ago , need to get renewed  . Tuberculosis    ACTIVE   Past Surgical History:   Procedure Laterality Date  . ABDOMINAL HYSTERECTOMY    . CHOLECYSTECTOMY N/A 05/02/2014   Procedure: LAPAROSCOPIC CHOLECYSTECTOMY ;  Surgeon: Abigail Miyamotoouglas Blackman, MD;  [facility];  Service: General;  Laterality: N/A;  laparoscopic cholecystectomy  . COLONOSCOPY    . NO PAST SURGERIES    . VIDEO BRONCHOSCOPY Bilateral 01/10/2016   Procedure: VIDEO BRONCHOSCOPY WITHOUT FLUORO;  Surgeon: Leslye Peerobert S Byrum, MD;  [facility];  Service: Cardiopulmonary;  Laterality: Bilateral;     A IV Location/Drains/Wounds Patient Lines/Drains/Airways Status   Active Line/Drains/Airways    Name:   Placement date:   Placement time:   Site:   Days:   Peripheral IV 03/16/19 Right Antecubital   03/16/19    0844    Antecubital   less than 1   Incision (Closed) 05/02/14 Abdomen Other (Comment)   05/02/14    1327     1779          Intake/Output Last 24 hours  Intake/Output Summary (Last 24 hours) at 03/16/2019 1910 Last data filed at 03/16/2019 1515 Gross per 24 hour  Intake 1350 ml  Output -  Net 1350 ml    Labs/Imaging Results for orders placed or performed during the hospital encounter of 03/16/19 (from the past 48 hour(s))  Urinalysis, Routine w reflex microscopic     Status: Abnormal   Collection Time: 03/16/19  5:50 AM  Result Value Ref Range   Color, Urine YELLOW YELLOW   APPearance CLEAR CLEAR   Specific Gravity, Urine 1.012 1.005 - 1.030   pH 8.0 5.0 - 8.0  Glucose, UA 50 (A) NEGATIVE mg/dL   Hgb urine dipstick SMALL (A) NEGATIVE   Bilirubin Urine NEGATIVE NEGATIVE   Ketones, ur NEGATIVE NEGATIVE mg/dL   Protein, ur 100 (A) NEGATIVE mg/dL   Nitrite NEGATIVE NEGATIVE   Leukocytes,Ua NEGATIVE NEGATIVE   RBC / HPF 6-10 0 - 5 RBC/hpf   WBC, UA 0-5 0 - 5 WBC/hpf   Bacteria, UA RARE (A) NONE SEEN   Squamous Epithelial / LPF 0-5 0 - 5   Mucus PRESENT     Comment: Performed at [facility], [place] [address] [place], [place] [address]  Lipase, blood     Status: None    Collection Time: 03/16/19  5:59 AM  Result Value Ref Range   Lipase 36 11 - 51 U/L    Comment: Performed at [facility] [address], [place], [place] [address]  Comprehensive metabolic panel     Status: Abnormal   Collection Time: 03/16/19  5:59 AM  Result Value Ref Range   Sodium 125 (L) 135 - 145 mmol/L   Potassium 4.3 3.5 - 5.1 mmol/L   Chloride 90 (L) 98 - 111 mmol/L   CO2 24 22 - 32 mmol/L   Glucose, Bld 129 (H) 70 - 99 mg/dL   BUN 13 8 - 23 mg/dL   Creatinine, Ser 0.60 0.44 - 1.00 mg/dL   Calcium 8.8 (L) 8.9 - 10.3 mg/dL   Total Protein 6.7 6.5 - 8.1 g/dL   Albumin 3.3 (L) 3.5 - 5.0 g/dL   AST 26 15 - 41 U/L   ALT 23 0 - 44 U/L   Alkaline Phosphatase 61 38 - 126 U/L   Total Bilirubin 0.7 0.3 - 1.2 mg/dL   GFR calc non Af Amer >60 >60 mL/min   GFR calc Af Amer >60 >60 mL/min   Anion gap 11 5 - 15    Comment: Performed at [facility], [place] [address], [place], [place] [address]  CBC     Status: Abnormal   Collection Time: 03/16/19  5:59 AM  Result Value Ref Range   WBC 4.7 4.0 - 10.5 K/uL   RBC 3.82 (L) 3.87 - 5.11 MIL/uL   Hemoglobin 8.0 (L) 12.0 - 15.0 g/dL    Comment: Reticulocyte Hemoglobin testing may be clinically indicated, consider ordering this additional test [id number]    HCT 24.7 (L) 36.0 - 46.0 %   MCV 64.7 (L) 80.0 - 100.0 fL   MCH 20.9 (L) 26.0 - 34.0 pg   MCHC 32.4 30.0 - 36.0 g/dL   RDW 14.8 11.5 - 15.5 %   Platelets 87 (L) 150 - 400 K/uL    Comment: REPEATED TO VERIFY PLATELET COUNT CONFIRMED BY SMEAR Immature Platelet Fraction may be clinically indicated, consider ordering this additional test [id number]    nRBC 0.0 0.0 - 0.2 %    Comment: Performed at [facility], [place] [address], [place], [place] [address]  Sodium, urine, random     Status: None   Collection Time: 03/16/19  6:03 AM  Result Value Ref Range   Sodium, Ur 142 mmol/L    Comment: Performed at [facility] [address] [place], [place] [address]   Osmolality, urine     Status: None   Collection Time: 03/16/19  6:03 AM  Result Value Ref Range   Osmolality, Ur 505 300 - 900 mOsm/kg    Comment: Performed at [facility]  [address] [place], [place] [address]  Type and screen     Status: None   Collection Time: 03/16/19  8:40 AM  Result Value Ref Range   ABO/RH(D) O POS    Antibody Screen NEG    Sample Expiration      03/19/2019,2359 Performed at [facility], [address] [place], [place] [address]   ABO/Rh     Status: None   Collection Time: 03/16/19  8:40 AM  Result Value Ref Range   ABO/RH(D)      O POS Performed at [facility], [address], [place], [place] [address]   POC occult blood, ED RN will collect     Status: None   Collection Time: 03/16/19  9:29 AM  Result Value Ref Range   Fecal Occult Bld NEGATIVE NEGATIVE  SARS CORONAVIRUS 2 (TAT 6-24 HRS) Nasopharyngeal Nasopharyngeal Swab     Status: None   Collection Time: 03/16/19 10:37 AM   Specimen: Nasopharyngeal Swab  Result Value Ref Range   SARS Coronavirus 2 NEGATIVE NEGATIVE    Comment: (NOTE) SARS-CoV-2 target nucleic acids are NOT DETECTED. The SARS-CoV-2 RNA is generally detectable in upper and lower respiratory specimens during the acute phase of infection. Negative results do not preclude SARS-CoV-2 infection, do not rule out co-infections with other pathogens, and should not be used as the sole basis for treatment or other patient management decisions. Negative results must be combined with clinical observations, patient history, and epidemiological information. The expected result is Negative. Fact Sheet for Patients: HairSlick.no Fact Sheet for Healthcare Providers: quierodirigir.com This test is not yet approved or cleared by the [place] FDA and  has been authorized for detection and/or diagnosis of SARS-CoV-2 by FDA under an Emergency Use Authorization  (EUA). This EUA will remain  in effect (meaning this test can be used) for the duration of the COVID-19 declaration under Section 56 4(b)(1) of the Act, 21 U.S.C. section 360bbb-3(b)(1), unless the authorization is terminated or revoked sooner. Performed at [facility], [address], [place], [place] [address]   HIV Antibody (routine testing w rflx)     Status: None   Collection Time: 03/16/19  1:04 PM  Result Value Ref Range   HIV Screen 4th Generation wRfx NON REACTIVE NON REACTIVE    Comment: Performed at [facility], [address] [place], [place] [address]   Ct Abdomen Pelvis W Contrast  Result Date: 03/16/2019 CLINICAL DATA:  Generalized abdominal pain with vomiting EXAM: CT ABDOMEN AND PELVIS WITH CONTRAST TECHNIQUE: Multidetector CT imaging of the abdomen and pelvis was performed using the standard protocol following bolus administration of intravenous contrast. CONTRAST:  OMNIPAQUE IOHEXOL 300 MG/ML  SOLN COMPARISON:  12/06/2018 FINDINGS: Lower chest: Chronic atelectatic changes are noted in the right lung base inferiorly although some new focal consolidation is noted in the anterior aspect of the right lower lobe best seen on image number 3 of series 4. This is new from the prior exam. A few calcified granulomas are noted. Hepatobiliary: Gallbladder has been surgically removed. The liver demonstrates a focal hypodensity near the dome stable from the prior exam likely representing a focal cyst. Fullness of the biliary ductal system is noted similar to that seen on the prior exam consistent with the post cholecystectomy state. Pancreas: Small duodenal diverticulum is noted within the head of the pancreas. The pancreas is otherwise within normal limits. Spleen: Normal in size without focal abnormality. Adrenals/Urinary Tract: Adrenal glands  are within normal limits. Kidneys are well visualized bilaterally. No renal calculi or obstructive changes are seen. No focal  mass is noted. Delayed images demonstrate normal excretion of contrast material bilaterally. The bladder is well distended. Stomach/Bowel: The appendix is not well visualized. No inflammatory changes to suggest appendicitis are seen. No obstructive or inflammatory changes of the larger small-bowel are noted. Small sliding-type hiatal hernia is seen. Vascular/Lymphatic: Aortic atherosclerosis. No enlarged abdominal or pelvic lymph nodes. Reproductive: Uterus and bilateral adnexa are unremarkable. Other: No abdominal wall hernia or abnormality. No abdominopelvic ascites. Musculoskeletal: Degenerative changes of lumbar spine are noted. No acute bony abnormality is seen. Stable compression deformities of T12 and L1 are noted. IMPRESSION: New focal consolidation in the anterior aspect of the right lower lobe. Chronic changes similar to that noted on prior exam. No acute abnormality is seen. Electronically Signed   By: Alcide Clever M.D.   On: 03/16/2019 10:16   Dg Chest Portable 1 View  Result Date: 03/16/2019 CLINICAL DATA:  Mid abdominal pain, vomiting, cough EXAM: PORTABLE CHEST 1 VIEW COMPARISON:  12/06/2018 FINDINGS: Cardiomegaly. No confluent airspace opacities, effusions or edema. No acute bony abnormality. IMPRESSION: Cardiomegaly.  No active disease. Electronically Signed   By: Charlett Nose M.D.   On: 03/16/2019 08:41    Pending Labs Unresulted Labs (From admission, onward)    Start     Ordered   03/17/19 0600  TSH  Once,   STAT     03/16/19 1548   03/17/19 0600  Cortisol-am, blood  Once,   STAT     03/16/19 1548   03/17/19 0600  CBC  Tomorrow morning,   R     03/16/19 1548   03/17/19 0600  Ferritin  Tomorrow morning,   R     03/16/19 1548   03/17/19 0500  Hemoglobin A1c  Tomorrow morning,   R     03/16/19 1414   03/16/19 1837  Basic metabolic panel  STAT Now then every 4 hours ,   R (with STAT occurrences)     03/16/19 1441   03/16/19 1447  Procalcitonin - Baseline  ONCE - STAT,   STAT      03/16/19 1446   03/16/19 1439  Osmolality  ONCE - STAT,   STAT     03/16/19 1438   03/16/19 1403  Pathologist smear review  Once,   STAT     03/16/19 1403   03/16/19 1304  Culture, Urine  Once,   STAT     03/16/19 1303          Vitals/Pain Today's Vitals   03/16/19 1500 03/16/19 1630 03/16/19 1700 03/16/19 1730  BP: (!) 169/82 (!) 180/80 (!) 174/78 (!) 164/76  Pulse: 77 76 76 75  Resp: 10 11 16 16   Temp:      TempSrc:      SpO2: 97% 99% 99% 98%  PainSc:        Isolation Precautions No active isolations  Medications Medications  acetaminophen (TYLENOL) tablet 650 mg (has no administration in time range)    Or  acetaminophen (TYLENOL) suppository 650 mg (has no administration in time range)  ondansetron (ZOFRAN) injection 4 mg (4 mg Intravenous Given 03/16/19 1259)  cefTRIAXone (ROCEPHIN) 1 g in sodium chloride 0.9 % 100 mL IVPB (0 g Intravenous Stopped 03/16/19 1258)  azithromycin (ZITHROMAX) 500 mg in sodium chloride 0.9 % 250 mL IVPB (0 mg Intravenous Stopped 03/16/19 1515)    Followed by  azithromycin (ZITHROMAX)  250 mg in dextrose 5 % 125 mL IVPB (has no administration in time range)  morphine 4 MG/ML injection 4 mg (4 mg Intravenous Given 03/16/19 1300)  0.9 %  sodium chloride infusion ( Intravenous New Bag/Given 03/16/19 1259)  calcium carbonate (OS-CAL) tablet 600 mg (has no administration in time range)  gabapentin (NEURONTIN) capsule 600 mg (has no administration in time range)  lisinopril (ZESTRIL) tablet 5 mg (5 mg Oral Given 03/16/19 1629)  Insulin Glargine (LANTUS) Solostar Pen 10 Units (has no administration in time range)  insulin aspart (novoLOG) injection 0-9 Units (has no administration in time range)  labetalol (NORMODYNE) injection 5 mg (has no administration in time range)  sodium chloride flush (NS) 0.9 % injection 3 mL (3 mLs Intravenous Given 03/16/19 1103)  morphine 4 MG/ML injection 4 mg (4 mg Intravenous Given 03/16/19 0852)  ondansetron  (ZOFRAN) injection 4 mg (4 mg Intravenous Given 03/16/19 0851)  iohexol (OMNIPAQUE) 300 MG/ML solution 100 mL (100 mLs Intravenous Contrast Given 03/16/19 0942)  levofloxacin (LEVAQUIN) tablet 750 mg (750 mg Oral Given 03/16/19 1032)  ondansetron (ZOFRAN) injection 4 mg (4 mg Intravenous Given 03/16/19 1047)  sodium chloride 0.9 % bolus 1,000 mL (0 mLs Intravenous Stopped 03/16/19 1258)    Mobility walks with device Low fall risk   Focused Assessments    R Recommendations: See Admitting Provider Note  Report given to:   Additional Notes:

## 2019-03-16 NOTE — H&P (Addendum)
Date: 03/16/2019               Patient Name:  Olivia Williamson MRN: [id number]  DOB: Jun 15, 1945 Age / Sex: 73 y.o., female   PCP: Marcine MatarJohnson, Deborah B, MD         Medical Service: Internal Medicine Teaching Service         Attending Physician: Dr. Reymundo PollGuilloud, Carolyn, MD    First Contact: Dr. Ephriam Knuckleshristian Pager: [phone]  Second Contact: Dr. Cleaster CorinSeawell Pager: [phone]       After Hours (After 5p/  First Contact Pager: [phone]  weekends / holidays): Second Contact Pager: [phone]   Chief Complaint: chest/abd pain and nausea  History of Present Illness: Olivia Williamson is a 73 yo F with a PMHx notable for DMII, HTN, hemoglobin E, M. tuberculosis who presented with several days of cough, epigastric pain, fever with new focal infiltrate on RLL. A [place] interpreter was used for the interview but the patient had several episodes of vomiting and only answered a portion of the questions asked. Per the patient, she began having epigastric abdominal pain since the prior evening that was moderate in severity and persistent. She also endorsed nausea and vomiting but denied hematemesis. She denied fever but endorsed chills and a nonproductive cough that is long standing over the past several months but has worsened of recent. When asked about weight loss she did not know as she does not keep track. She endorsed dysuria as well.  Per EDP they were attempting discharge with PO antibiotics but the patient could not tolerate this so prompting need for admission.   Meds:  Calcium carbonate 600mg  BID Gabapentin 600mg  QHS Lisinopril 5mg  daily Insulin aspart 5U BID w/ breakfast and dinner Insulin Lantus 10U daily  Allergies: Allergies as of 03/16/2019 - Review Complete 03/16/2019  Allergen Reaction Noted  . Beef-derived products Other (See Comments) 04/10/2016  . Eggs or egg-derived products Other (See Comments) 04/10/2016  . Fish allergy Other (See Comments) 04/10/2016  . Fruit & vegetable daily [nutritional  supplements] Other (See Comments) 04/10/2016   Past Medical History:  Diagnosis Date  . Arthritis   . Diabetes mellitus without complication (HCC)    stopped DM meds 2004  . GERD (gastroesophageal reflux disease)   . Hypertension    meds stopped 1 week ago , need to get renewed  . Tuberculosis    ACTIVE   Family History:  Family History  Problem Relation Age of Onset  . Heart disease Maternal Uncle [id number]   Social History:  Social History   Tobacco Use  . Smoking status: Never Smoker  . Smokeless tobacco: Never Used  Substance Use Topics  . Alcohol use: No  . Drug use: No   Review of Systems A complete ROS was negative except as per HPI.   Physical Exam: Blood pressure (!) 180/83, pulse 81, temperature 98 F (36.7 C), temperature source Oral, resp. rate 19, last menstrual period 05/11/2016, SpO2 98 %. Physical Exam Constitutional:      General: She is not in acute distress.    Appearance: She is well-developed. She is not diaphoretic.  HENT:     Head: Normocephalic and atraumatic.  Eyes:     Conjunctiva/sclera: Conjunctivae normal.  Neck:     Musculoskeletal: Normal range of motion and neck supple.  Cardiovascular:     Rate and Rhythm: Normal rate and regular rhythm.     Heart sounds: No murmur.  Pulmonary:     Effort: Pulmonary effort  is normal. No respiratory distress.     Breath sounds: Normal breath sounds. No stridor.  Abdominal:     General: Bowel sounds are normal. There is no distension.     Palpations: Abdomen is soft.     Tenderness: There is abdominal tenderness in the epigastric area.  Musculoskeletal:        General: No tenderness.  Skin:    General: Skin is warm.     Capillary Refill: Capillary refill takes less than 2 seconds.  Neurological:     Mental Status: She is alert and oriented to person, place, and time.    EKG: personally reviewed my interpretation is NSR  CXR: personally reviewed my interpretation is minimal change, enlarged  cardiac silhouette, no acute effusion or edema   Assessment & Plan by Problem: Active Problems:   Diabetes mellitus with neurological manifestations (HCC)   History of tuberculosis   Community acquired pneumonia  Assessment: Olivia Williamson is a 73 yo F with a PMHx notable for DMII, HTN, hemoglobin E, M. tuberculosis who presented with several days of cough, epigastric pain, fever with new focal infiltrate on RLL. Etiology uncertain, she does endorse cough as well as dysuria but there is little indication of systemic infection. Gastroenteritis is certainly possible but her symptoms poorly match this. Her history of TB s/p completion of therapy is concerning but seems unlikely given her lack of supporting symptoms. Admission was requested for CAP given the CT findings. However, the CT abd/pelvis is read as if there is a new focal consolidation of the anterior aspect of the right lower lobe but when compared to her Chest CT from November 28th 2017 I see little variation or at least enough change to feel confident that her atypical nausea symptoms are the result of this.   Plan: Abdominal pain: Nausea/vomiting: Given her normal lipase and symptom description she certainly could have gastroenteritis as well as less likely a PUD or gastritis. She lacks myalgias or other flu like symptoms. Her cough is chronic. The patient has a 7 drop points in her serum sodium which could potentially lead to her N/V. However, I feel that the drop is secondary to her symptoms and not the preceding factor but can not definitively determine this without urine studies. Unfortunately, as she has received a fluid bolus in the ED her studies will be unreliable. I will attempt to contact the lab to determine if a previously collected sample can be used. --Attempt to obtain urine sodium, osmolality and serum osmolality  --Trend BMP's Q4 hours  --Continue IV hydration --Continue IV zofran --Continue IV morphine --Observation   ?Community acquired pneumonia: Uncertain. We will continue empiric therapy at this time and monitor closely to deescalate. Patient is afebrile, absent hemodynamic instability, no elevated inflammatory markers.  --Continue azithromycin IV until she can tolerate PO --Continue CTX IV until PO is tolerable  --Ordered CT chest --Monitor for improvement  DMII: A1c 9.3% in January 2019. Will continue Lantus and SSI sen --A1c ordered for am --Lantus 10U QHS --SSI sen  Thrombocytopenia: Etiology uncertain. Chronic with slight worsening likely due to acute illness severity. Has had very few normal platelet counts in the past five years per chart review since she first entered our system.  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  --Ordered pathology review smear  Hx of TB: [facility]. Patient could not recall if she was experiencing weight loss or  hempotysis.   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 Slightly lower Hgb today. Will order iron studies to insure no other treatable etiology.  --Ferritin ordered  Diet: Advance as tolerated Cope: Full DVT PPX: Enoxaparin Dispo: Admit patient to Inpatient with expected length of stay greater than 2 midnights.  Signed: Kathi Ludwig, MD 03/16/2019, 1:35 PM  Pager: # [phone]

## 2019-03-16 NOTE — ED Notes (Signed)
Patient transported to CT 

## 2019-03-16 NOTE — ED Provider Notes (Signed)
[facility] EMERGENCY DEPARTMENT Provider Note   CSN: [id number] Arrival date & time: 03/16/19  9518     History   Chief Complaint Chief Complaint  Patient presents with  . Abdominal Pain  . Ankle Pain    HPI Olivia Williamson is a 73 y.o. female.     The history is provided by the patient. No language interpreter was used.  Abdominal Pain Ankle Pain    73 year old [place] female with history of diabetes, GERD, hypertension, TB brought here via EMS from home for evaluation of abdominal pain.  Patient speaks in [place], I was able to communicate with her.  Patient complaining of epigastric abdominal pain since last night.  Pain has been persistent, moderate in severity.  She also endorsed nausea and vomiting.  She does not recall any abnormal bleeding via the rectum with vomit.  No report of any fever chills no chest pain shortness of breath.  She does endorse nonproductive cough for the past 3 days.  She denies any dysuria.  Denies any body aches but does states that she has bilateral leg pain and left foot pain.  States her left foot pain is chronic and related to her arthritis.  She denies any recent sick contact.  Admits to history of diabetes and has been taking her medication.  Past Medical History:  Diagnosis Date  . Arthritis   . Diabetes mellitus without complication ([place])    stopped DM meds 2004  . GERD (gastroesophageal reflux disease)   . Hypertension    meds stopped 1 week ago , need to get renewed  . Tuberculosis    ACTIVE    Patient Active Problem List   Diagnosis Date Noted  . Positive for macroalbuminuria 02/05/2019  . Influenza vaccine needed 02/02/2019  . 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 Lumbar radiculopathy 02/02/2017  . Chronic left-sided thoracic back pain 02/02/2017  . 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 Multinodular goiter 11/01/2016  . Thyroid nodule 04/13/2016  .  Abnormal CT of the chest   . Tuberculosis 01/09/2016  . Protein-calorie malnutrition, severe 12/16/2015  . Generalized weakness 12/15/2015  . GERD (gastroesophageal reflux disease)   . Diabetes mellitus with neurological manifestations [facility])     Past Surgical History:  Procedure Laterality Date  . ABDOMINAL HYSTERECTOMY    . CHOLECYSTECTOMY N/A 05/02/2014   Procedure: LAPAROSCOPIC CHOLECYSTECTOMY ;  Surgeon: Coralie Keens, MD;  Location: [facility];  Service: General;  Laterality: N/A;  laparoscopic cholecystectomy  . COLONOSCOPY    . NO PAST SURGERIES    . VIDEO BRONCHOSCOPY Bilateral 01/10/2016   Procedure: VIDEO BRONCHOSCOPY WITHOUT FLUORO;  Surgeon: Collene Gobble, MD;  Location: [facility];  Service: Cardiopulmonary;  Laterality: Bilateral;     OB History   No obstetric history on file.      Home Medications    Prior to Admission medications   Medication Sig Start Date End Date Taking? Authorizing Provider  alendronate (FOSAMAX) 70 MG tablet Take 1 tablet (70 mg total) by mouth once a week. Take with a full glass of water on an empty stomach. Patient not taking: Reported on 12/06/2018 07/27/17   [name], MD  calcium carbonate (OS-CAL) 600 MG tablet Take 1 tablet (600 mg total) by mouth 2 (two) times daily with a meal. 12/30/18   [name], MD  gabapentin (NEURONTIN) 300 MG capsule Take 2 capsules (600 mg total) by mouth at bedtime. 02/02/19  Marcine Matar, MD  insulin aspart (NOVOLOG FLEXPEN) 100 UNIT/ML FlexPen 5 units with breakfast and dinner 12/30/18   Marcine Matar, MD  Insulin Glargine (LANTUS SOLOSTAR) 100 UNIT/ML Solostar Pen Inject 10 Units into the skin at bedtime. 12/30/18   Marcine Matar, MD  Insulin Pen Needle (BD PEN NEEDLE NANO U/F) 32G X 4 MM MISC USE TO INJECT INSULIN 12/30/18   Marcine Matar, MD  lisinopril (ZESTRIL) 5 MG tablet Take 1 tablet (5 mg total) by mouth daily. 02/05/19   Marcine Matar, MD  Vitamin D,  Cholecalciferol, 10 MCG (400 UNIT) CAPS Take 800 Units by mouth daily. 12/30/18   Marcine Matar, MD    Family History Family History  Problem Relation Age of Onset  . Heart disease Maternal Uncle 45    Social History Social History   Tobacco Use  . Smoking status: Never Smoker  . Smokeless tobacco: Never Used  Substance Use Topics  . Alcohol use: No  . Drug use: No     Allergies   Beef-derived products, Eggs or egg-derived products, Fish allergy, and Fruit & vegetable daily [nutritional supplements]   Review of Systems Review of Systems  Gastrointestinal: Positive for abdominal pain.  All other systems reviewed and are negative.    Physical Exam Updated Vital Signs BP (!) 159/71 (BP Location: Left Arm)   Pulse 72   Temp 98 F (36.7 C) (Oral)   Resp 17   LMP 05/11/2016   SpO2 99%   Physical Exam Vitals signs and nursing note reviewed.  Constitutional:      General: She is not in acute distress.    Appearance: She is well-developed.     Comments: Olivia Williamson elderly female appears to be in no acute discomfort.  HENT:     Head: Atraumatic.  Eyes:     Conjunctiva/sclera: Conjunctivae normal.  Neck:     Musculoskeletal: Neck supple.  Cardiovascular:     Rate and Rhythm: Normal rate and regular rhythm.  Pulmonary:     Effort: Pulmonary effort is normal.     Breath sounds: Normal breath sounds. No wheezing, rhonchi or rales.  Abdominal:     General: Abdomen is flat. Bowel sounds are normal.     Palpations: Abdomen is soft.     Tenderness: There is no abdominal tenderness (No significant abdominal tenderness on exam.).  Musculoskeletal:     Comments: Examination of left foot reveals no evidence of focal point tenderness overlying skin changes crepitus or deformity noted.  Dorsalis pedis pulse palpable with brisk cap refill.  Left ankle nontender.  Skin:    Capillary Refill: Capillary refill takes less than 2 seconds.     Findings: No rash.   Neurological:     Mental Status: She is alert. Mental status is at baseline.  Psychiatric:        Mood and Affect: Mood normal.      ED Treatments / Results  Labs (all labs ordered are listed, but only abnormal results are displayed) Labs Reviewed  COMPREHENSIVE METABOLIC PANEL - Abnormal; Notable for the following components:      Result Value   Sodium 125 (*)    Chloride 90 (*)    Glucose, Bld 129 (*)    Calcium 8.8 (*)    Albumin 3.3 (*)    All other components within normal limits  CBC - Abnormal; Notable for the following components:   RBC 3.82 (*)    Hemoglobin 8.0 (*)  HCT 24.7 (*)    MCV 64.7 (*)    MCH 20.9 (*)    All other components within normal limits  URINALYSIS, ROUTINE W REFLEX MICROSCOPIC - Abnormal; Notable for the following components:   Glucose, UA 50 (*)    Hgb urine dipstick SMALL (*)    Protein, ur 100 (*)    Bacteria, UA RARE (*)    All other components within normal limits  LIPASE, BLOOD    EKG None  ED ECG REPORT   Date: 03/16/2019  Rate: 81  Rhythm: normal sinus rhythm  QRS Axis: normal  Intervals: normal  ST/T Wave abnormalities: nonspecific ST changes  Conduction Disutrbances:none  Narrative Interpretation:   Old EKG Reviewed: unchanged  I have personally reviewed the EKG tracing and agree with the computerized printout as noted.   Radiology Ct Abdomen Pelvis W Contrast  Result Date: 03/16/2019 CLINICAL DATA:  Generalized abdominal pain with vomiting EXAM: CT ABDOMEN AND PELVIS WITH CONTRAST TECHNIQUE: Multidetector CT imaging of the abdomen and pelvis was performed using the standard protocol following bolus administration of intravenous contrast. CONTRAST:  OMNIPAQUE IOHEXOL 300 MG/ML  SOLN COMPARISON:  12/06/2018 FINDINGS: Lower chest: Chronic atelectatic changes are noted in the right lung base inferiorly although some new focal consolidation is noted in the anterior aspect of the right lower lobe best seen on image  number 3 of series 4. This is new from the prior exam. A few calcified granulomas are noted. Hepatobiliary: Gallbladder has been surgically removed. The liver demonstrates a focal hypodensity near the dome stable from the prior exam likely representing a focal cyst. Fullness of the biliary ductal system is noted similar to that seen on the prior exam consistent with the post cholecystectomy state. Pancreas: Small duodenal diverticulum is noted within the head of the pancreas. The pancreas is otherwise within normal limits. Spleen: Normal in size without focal abnormality. Adrenals/Urinary Tract: Adrenal glands are within normal limits. Kidneys are well visualized bilaterally. No renal calculi or obstructive changes are seen. No focal mass is noted. Delayed images demonstrate normal excretion of contrast material bilaterally. The bladder is well distended. Stomach/Bowel: The appendix is not well visualized. No inflammatory changes to suggest appendicitis are seen. No obstructive or inflammatory changes of the larger small-bowel are noted. Small sliding-type hiatal hernia is seen. Vascular/Lymphatic: Aortic atherosclerosis. No enlarged abdominal or pelvic lymph nodes. Reproductive: Uterus and bilateral adnexa are unremarkable. Other: No abdominal wall hernia or abnormality. No abdominopelvic ascites. Musculoskeletal: Degenerative changes of lumbar spine are noted. No acute bony abnormality is seen. Stable compression deformities of T12 and L1 are noted. IMPRESSION: New focal consolidation in the anterior aspect of the right lower lobe. Chronic changes similar to that noted on prior exam. No acute abnormality is seen. Electronically Signed   By: Alcide Clever M.D.   On: 03/16/2019 10:16   Dg Chest Portable 1 View  Result Date: 03/16/2019 CLINICAL DATA:  Mid abdominal pain, vomiting, cough EXAM: PORTABLE CHEST 1 VIEW COMPARISON:  12/06/2018 FINDINGS: Cardiomegaly. No confluent airspace opacities, effusions or edema.  No acute bony abnormality. IMPRESSION: Cardiomegaly.  No active disease. Electronically Signed   By: Charlett Nose M.D.   On: 03/16/2019 08:41    Procedures Procedures (including critical care time)  Medications Ordered in ED Medications  sodium chloride flush (NS) 0.9 % injection 3 mL (has no administration in time range)  sodium chloride 0.9 % bolus 1,000 mL (has no administration in time range)  morphine 4 MG/ML injection  4 mg (4 mg Intravenous Given 03/16/19 0852)  ondansetron (ZOFRAN) injection 4 mg (4 mg Intravenous Given 03/16/19 0851)  iohexol (OMNIPAQUE) 300 MG/ML solution 100 mL (100 mLs Intravenous Contrast Given 03/16/19 0942)  levofloxacin (LEVAQUIN) tablet 750 mg (750 mg Oral Given 03/16/19 1032)  ondansetron (ZOFRAN) injection 4 mg (4 mg Intravenous Given 03/16/19 1047)     Initial Impression / Assessment and Plan / ED Course  I have reviewed the triage vital signs and the nursing notes.  Pertinent labs & imaging results that were available during my care of the patient were reviewed by me and considered in my medical decision making (see chart for details).        BP (!) 181/82 (BP Location: Left Arm)   Pulse 79   Temp 98 F (36.7 C) (Oral)   Resp 14   LMP 05/11/2016   SpO2 98%    Final Clinical Impressions(s) / ED Diagnoses   Final diagnoses:  Community acquired pneumonia of right lower lobe of lung  Non-intractable vomiting with nausea, unspecified vomiting type  Anemia, unspecified type  Hyponatremia    ED Discharge Orders    None     8:17 AM Patient here with epigastric abdominal pain since last night with associated nausea and vomiting.  She also endorsed chronic left foot pain related to arthritis.  No recent injury to her foot.  Patient does not have any reproducible pain on exam.  Labs remarkable for anemia with hemoglobin of 8.0.  Her baseline is usually 9.  Will obtain Hemoccult.  She has normal WBC normal lipase and UA without evidence of  urinary tract infection.  Given her age, will obtain abdominal and pelvic CT scan to rule out acute abdominal pathology.  Care discussed with Dr. Stevie Kern.  10:30 AM Patient did report nonproductive cough ongoing for the past 3 days.  Chest x-ray showed cardiomegaly but no active disease.  However abdominal pelvis CT scan demonstrate a new focal consolidation in the anterior aspect of the right lower lobe.  This is consistent with patient presenting of abdominal pain.  The setting of cough, and new focal infiltrate, patient will be treated for community-acquired pneumonia. Does have hx of TB, but I have low suspicion this is active TB.  She is afebrile, she is not hypoxic.  I will also order a COVID-19 test while she is here.  I have attempted to contact patient's family through the phone numbers listed in her chart without any success.  10:36 AM Patient was giving Levaquin oral pill however after taking the medication she became nauseous, actively vomiting and unable to keep her medication down.  Will have patient admitted for further management of her condition.  Will initiate IV levaquin.    10:54 AM Appreciate consultation from internal medicine resident who agrees to see and admit pt for further care.  Sangeeta Youse was evaluated in Emergency Department on 03/16/2019 for the symptoms described in the history of present illness. She was evaluated in the context of the global COVID-19 pandemic, which necessitated consideration that the patient might be at risk for infection with the SARS-CoV-2 virus that causes COVID-19. Institutional protocols and algorithms that pertain to the evaluation of patients at risk for COVID-19 are in a state of rapid change based on information released by regulatory bodies including the CDC and federal and state organizations. These policies and algorithms were followed during the patient's care in the ED.    Fayrene Helper, PA-C 03/16/19 1058  Milagros Lollykstra, Richard S, MD  03/16/19 [phone]

## 2019-03-16 NOTE — Plan of Care (Signed)

## 2019-03-16 NOTE — ED Triage Notes (Signed)
Patient arrived with EMS from home reports mid abdominal pain with emesis onset yesterday , patient added chronic left foot/ankle pain for several years , [place] interpreter service utilized at triage .

## 2019-03-16 NOTE — Progress Notes (Deleted)
    S:     No chief complaint on file.  Patient arrives ***.  Presents for diabetes evaluation, education, and management Patient was referred and last seen by Primary Care Provider on ***.  Patient was referred on ***.  Patient was last seen by Primary Care Provider on ***.   Patient reports Diabetes was diagnosed in ***.   Family/Social History: ***  Insurance coverage/medication affordability: ***  Patient {Actions; denies-reports:120008} adherence with medications.  Current diabetes medications include: *** Current hypertension medications include: *** Current hyperlipidemia medications include: ***  Patient {Actions; denies-reports:120008} hypoglycemic events.  Patient reported dietary habits: Eats *** meals/day Breakfast:*** Lunch:*** Dinner:*** Snacks:*** Drinks:***  Patient-reported exercise habits: ***   Patient {Actions; denies-reports:120008} nocturia.  Patient {Actions; denies-reports:120008} neuropathy. Patient {Actions; denies-reports:120008} visual changes. Patient {Actions; denies-reports:120008} self foot exams.     O:    Lab Results  Component Value Date   HGBA1C 14.4 (A) 12/30/2018   There were no vitals filed for this visit.  Lipid Panel     Component Value Date/Time   CHOL 151 04/10/2016 1534   TRIG 156.0 (H) 04/10/2016 1534   HDL 83.30 04/10/2016 1534   CHOLHDL 2 04/10/2016 1534   VLDL 31.2 04/10/2016 1534   LDLCALC 36 04/10/2016 1534    Home fasting CBG: ***  2 hour post-prandial/random CBG: ***.   Clinical ASCVD: {YES/NO:21197} The 10-year ASCVD risk score Mikey Bussing DC Jr., et al., 2013) is: 35.5%   Values used to calculate the score:     Age: 73 years     Sex: Female     Is Non-Hispanic African American: No     Diabetic: Yes     Tobacco smoker: No     Systolic Blood Pressure: 272 mmHg     Is BP treated: Yes     HDL Cholesterol: 83.3 mg/dL     Total Cholesterol: 151 mg/dL    A/P: Diabetes longstanding*** currently ***.  Patient is not*** able to verbalize appropriate hypoglycemia management plan. Patient {Is/is not:9024} adherent with medication. Control is suboptimal due to ***. -{Meds adjust:18428} basal insulin *** (insulin ***). Patient will continue to titrate 1 unit every ***days if fasting CBGs > 100mg /dl until fasting CBGs reach goal or next visit.  -{Meds adjust:18428}  rapid insulin *** (insulin ***) to ***.  -{Meds adjust:18428} GLP-1 *** (generic name***) to ***.  -{Meds adjust:18428} SGLT2-I *** (generic name***) to ***. Counseled on sick day rules for ***. -Extensively discussed pathophysiology of DM, recommended lifestyle interventions, dietary effects on glycemic control -Counseled on s/sx of and management of hypoglycemia -Next A1C anticipated ***.   ASCVD risk - primary***secondary prevention in patient with DM. Last LDL {Is/is not:9024} controlled. ASCVD risk score {Is/is not:9024} >20%  - {Desc; low/moderate/high:110033} intensity statin indicated. Aspirin {Is/is not:9024} indicated.  -{Meds adjust:18428} aspirin *** mg  -{Meds adjust:18428} ***statin *** mg.   Hypertension longstanding*** currently ***.  BP goal = *** mmHg. Patient {Is/is not:9024} adherent with medication. Control is suboptimal due to ***. -***  Written patient instructions provided.  Total time in face to face counseling *** minutes.   Follow up Pharmacist/PCP*** Clinic Visit in ***.   Patient seen with ***

## 2019-03-17 DIAGNOSIS — E119 Type 2 diabetes mellitus without complications: Secondary | ICD-10-CM

## 2019-03-17 DIAGNOSIS — J181 Lobar pneumonia, unspecified organism: Secondary | ICD-10-CM

## 2019-03-17 DIAGNOSIS — D582 Other hemoglobinopathies: Secondary | ICD-10-CM

## 2019-03-17 DIAGNOSIS — E222 Syndrome of inappropriate secretion of antidiuretic hormone: Secondary | ICD-10-CM | POA: Diagnosis not present

## 2019-03-17 DIAGNOSIS — N390 Urinary tract infection, site not specified: Secondary | ICD-10-CM | POA: Diagnosis not present

## 2019-03-17 DIAGNOSIS — E871 Hypo-osmolality and hyponatremia: Secondary | ICD-10-CM | POA: Diagnosis not present

## 2019-03-17 DIAGNOSIS — Z79899 Other long term (current) drug therapy: Secondary | ICD-10-CM

## 2019-03-17 DIAGNOSIS — D696 Thrombocytopenia, unspecified: Secondary | ICD-10-CM

## 2019-03-17 DIAGNOSIS — K449 Diaphragmatic hernia without obstruction or gangrene: Secondary | ICD-10-CM

## 2019-03-17 DIAGNOSIS — R109 Unspecified abdominal pain: Secondary | ICD-10-CM | POA: Diagnosis not present

## 2019-03-17 DIAGNOSIS — Z794 Long term (current) use of insulin: Secondary | ICD-10-CM

## 2019-03-17 LAB — CBC
HCT: 23.2 % — ABNORMAL LOW (ref 36.0–46.0)
Hemoglobin: 7.6 g/dL — ABNORMAL LOW (ref 12.0–15.0)
MCH: 20.8 pg — ABNORMAL LOW (ref 26.0–34.0)
MCHC: 32.8 g/dL (ref 30.0–36.0)
MCV: 63.4 fL — ABNORMAL LOW (ref 80.0–100.0)
Platelets: 95 10*3/uL — ABNORMAL LOW (ref 150–400)
RBC: 3.66 MIL/uL — ABNORMAL LOW (ref 3.87–5.11)
RDW: 14.7 % (ref 11.5–15.5)
WBC: 5.4 10*3/uL (ref 4.0–10.5)
nRBC: 0 % (ref 0.0–0.2)

## 2019-03-17 LAB — BASIC METABOLIC PANEL
Anion gap: 7 (ref 5–15)
Anion gap: 8 (ref 5–15)
Anion gap: 8 (ref 5–15)
Anion gap: 9 (ref 5–15)
Anion gap: 7 (ref 5–15)
BUN: 17 mg/dL (ref 8–23)
BUN: 19 mg/dL (ref 8–23)
BUN: 16 mg/dL (ref 8–23)
BUN: 15 mg/dL (ref 8–23)
BUN: 16 mg/dL (ref 8–23)
CO2: 23 mmol/L (ref 22–32)
CO2: 21 mmol/L — ABNORMAL LOW (ref 22–32)
CO2: 22 mmol/L (ref 22–32)
CO2: 22 mmol/L (ref 22–32)
CO2: 23 mmol/L (ref 22–32)
Calcium: 7.7 mg/dL — ABNORMAL LOW (ref 8.9–10.3)
Calcium: 7.6 mg/dL — ABNORMAL LOW (ref 8.9–10.3)
Calcium: 7.8 mg/dL — ABNORMAL LOW (ref 8.9–10.3)
Calcium: 7.9 mg/dL — ABNORMAL LOW (ref 8.9–10.3)
Calcium: 8.1 mg/dL — ABNORMAL LOW (ref 8.9–10.3)
Chloride: 95 mmol/L — ABNORMAL LOW (ref 98–111)
Chloride: 97 mmol/L — ABNORMAL LOW (ref 98–111)
Chloride: 98 mmol/L (ref 98–111)
Chloride: 96 mmol/L — ABNORMAL LOW (ref 98–111)
Chloride: 101 mmol/L (ref 98–111)
Creatinine, Ser: 0.83 mg/dL (ref 0.44–1.00)
Creatinine, Ser: 0.89 mg/dL (ref 0.44–1.00)
Creatinine, Ser: 0.68 mg/dL (ref 0.44–1.00)
Creatinine, Ser: 0.7 mg/dL (ref 0.44–1.00)
Creatinine, Ser: 0.85 mg/dL (ref 0.44–1.00)
GFR calc Af Amer: >60 mL/min (ref >60)
GFR calc Af Amer: >60 mL/min (ref >60)
GFR calc Af Amer: >60 mL/min (ref >60)
GFR calc Af Amer: >60 mL/min (ref >60)
GFR calc Af Amer: >60 mL/min (ref >60)
GFR calc non Af Amer: >60 mL/min (ref >60)
GFR calc non Af Amer: >60 mL/min (ref >60)
GFR calc non Af Amer: >60 mL/min (ref >60)
GFR calc non Af Amer: >60 mL/min (ref >60)
GFR calc non Af Amer: >60 mL/min (ref >60)
Glucose, Bld: 98 mg/dL (ref 70–99)
Glucose, Bld: 139 mg/dL — ABNORMAL HIGH (ref 70–99)
Glucose, Bld: 124 mg/dL — ABNORMAL HIGH (ref 70–99)
Glucose, Bld: 104 mg/dL — ABNORMAL HIGH (ref 70–99)
Glucose, Bld: 96 mg/dL (ref 70–99)
Potassium: 4 mmol/L (ref 3.5–5.1)
Potassium: 3.5 mmol/L (ref 3.5–5.1)
Potassium: 3.6 mmol/L (ref 3.5–5.1)
Potassium: 3.6 mmol/L (ref 3.5–5.1)
Potassium: 3.4 mmol/L — ABNORMAL LOW (ref 3.5–5.1)
Sodium: 125 mmol/L — ABNORMAL LOW (ref 135–145)
Sodium: 126 mmol/L — ABNORMAL LOW (ref 135–145)
Sodium: 128 mmol/L — ABNORMAL LOW (ref 135–145)
Sodium: 127 mmol/L — ABNORMAL LOW (ref 135–145)
Sodium: 131 mmol/L — ABNORMAL LOW (ref 135–145)

## 2019-03-17 LAB — URINE CULTURE: Culture: <10000 — AB

## 2019-03-17 LAB — TSH: TSH: 0.556 u[IU]/mL (ref 0.350–4.500)

## 2019-03-17 LAB — CORTISOL-AM, BLOOD
Cortisol - AM: 3.7 ug/dL — ABNORMAL LOW (ref 6.7–22.6)
Cortisol - AM: 11.8 ug/dL (ref 6.7–22.6)

## 2019-03-17 LAB — ACTH STIMULATION, 3 TIME POINTS
Cortisol, 30 Min: 23.8 ug/dL
Cortisol, 60 Min: 26.1 ug/dL
Cortisol, Base: 11.2 ug/dL

## 2019-03-17 LAB — GLUCOSE, CAPILLARY
Glucose-Capillary: 72 mg/dL (ref 70–99)
Glucose-Capillary: 115 mg/dL — ABNORMAL HIGH (ref 70–99)
Glucose-Capillary: 123 mg/dL — ABNORMAL HIGH (ref 70–99)
Glucose-Capillary: 116 mg/dL — ABNORMAL HIGH (ref 70–99)

## 2019-03-17 LAB — PATHOLOGIST SMEAR REVIEW

## 2019-03-17 LAB — MAGNESIUM: Magnesium: 1.7 mg/dL (ref 1.7–2.4)

## 2019-03-17 LAB — FERRITIN: Ferritin: 240 ng/mL (ref 11–307)

## 2019-03-17 LAB — PHOSPHORUS: Phosphorus: 2.3 mg/dL — ABNORMAL LOW (ref 2.5–4.6)

## 2019-03-17 MED ORDER — COSYNTROPIN 0.25 MG IJ SOLR
0.2500 mg | Freq: Once | INTRAMUSCULAR | Status: AC
  Administered 2019-03-17: 0.25 mg via INTRAVENOUS
  Filled 2019-03-17: qty 0.25

## 2019-03-17 MED ORDER — PROMETHAZINE HCL 25 MG/ML IJ SOLN: 12.5000 mg | Freq: Four times a day (QID) | INTRAMUSCULAR | Status: DC | PRN

## 2019-03-17 MED ORDER — PROMETHAZINE HCL 25 MG PO TABS: 12.5000 mg | ORAL_TABLET | Freq: Four times a day (QID) | ORAL | Status: DC | PRN

## 2019-03-17 NOTE — Plan of Care (Signed)

## 2019-03-17 NOTE — Progress Notes (Signed)
NAMEEmersyn Williamson, MRN:  [id number], DOB:  23-Jun-1945, LOS: 1 ADMISSION DATE:  03/16/2019,  Primary: [name], MD  CHIEF COMPLAINT: Nausea and vomiting  Medical Service: Internal Medicine Teaching Service         Attending Physician: Dr. Velna Ochs, MD    First Contact: Dr. Darrick Meigs Pager: [phone]  Second Contact: Dr. Sharon Seller Pager: [phone]       After Hours (After 5p/  First Contact Pager: [phone]  weekends / holidays): Second Contact Pager: [phone]    Brief History  73 year old female presenting with nausea and vomiting.  Found to have right lower lobe pneumonia.  Initial plan was to discharge however patient was not tolerating p.o. subsequently admitted for parenteral treatment until tolerating po.  Subjective  Interpreter used.  Patient still endorses nausea.  Reviewed HPI. No cough. She has just had nausea and headache and exhausted.  She has had pain with urination for the past year.   Significant Hospital Events   10/22 admission  Objective   Blood pressure (!) 139/52, pulse 74, temperature 97.7 F (36.5 C), temperature source Oral, resp. rate 17, last menstrual period 05/11/2016, SpO2 99 %.     Intake/Output Summary (Last 24 hours) at 03/17/2019 0905 Last data filed at 03/17/2019 0324 Gross per 24 hour  Intake 1900 ml  Output -  Net 1900 ml   Examination: GENERAL: in no acute distress CARDIAC: heart RRR. No peripheral edema.  PULMONARY: acyanotic. Lung sounds clear to auscultation. ABDOMEN: soft. Nontender to palpation.  Nondistended. No organomegaly appreciated. NEURO: CN II-XII grossly intact. SKIN: no rash or lesions on limited exam   Significant Diagnostic Tests:  10/22 CT abdomen: Small sliding-type hiatal hernia 10/22 CT chest: Small amount of consolidation right lower lobe with bilateral patchiness/groundglass attenuation.  Trace right pleural effusion.  Dilation of severe pulmonary trunk suggesting pulmonary hypertension--3.7 cm in  greatest diameter  Micro Data:  10/22 urine culture>>  Antimicrobials:  Azithromycin 10/22>> 10/23 Rocephin 10/22>>  Labs    CBC Latest Ref Rng & Units 03/17/2019 03/16/2019 12/06/2018  WBC 4.0 - 10.5 K/uL 5.4 4.7 4.8  Hemoglobin 12.0 - 15.0 g/dL 7.6(L) 8.0(L) 9.9(L)  Hematocrit 36.0 - 46.0 % 23.2(L) 24.7(L) 31.0(L)  Platelets 150 - 400 K/uL 95(L) 87(L) 77(L)   BMP Latest Ref Rng & Units 03/17/2019 03/17/2019 03/16/2019  Glucose 70 - 99 mg/dL 139(H) 98 171(H)  BUN 8 - 23 mg/dL 19 17 15   Creatinine 0.44 - 1.00 mg/dL 0.89 0.83 0.66  BUN/Creat Ratio 12 - 28 - - -  Sodium 135 - 145 mmol/L 126(L) 125(L) 123(L)  Potassium 3.5 - 5.1 mmol/L 3.5 4.0 4.2  Chloride 98 - 111 mmol/L 97(L) 95(L) 92(L)  CO2 22 - 32 mmol/L 21(L) 23 20(L)  Calcium 8.9 - 10.3 mg/dL 7.6(L) 7.7(L) 8.0(L)   Procalc <0.1 UA: + protein, rare bacteria Lipase neg Urine sodium 142; urine osmol 505; serum osmol 267  Summary  73 year old female who presented to the ED on 03/16/2019 for evaluation of several days of epigastric pain, nausea, and vomiting who was found to have acute on chronic hyponatremia.  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  Sodium 125 on admission--baseline in low 130s.  Decreased to 122 overnight.  Etiology remains unclear. Considerations include the following -adrenal insufficiency. Primary vs secondary--Cortisol 3.7. Stim test not suggestive of  primary insufficiency. ACTH pending to evaluate for secondary (central) insufficiency.  -medications including diuretics, ACE/ARB. Not on any diuretics. On low dose lisinopril which is unlikely to be cause. No apparent other recent med changes. -TSH borderline low. -SIADH. Is not having significant UOP -dehydration -pseudohyponatremia. Most recent lipid panel from 2017 normal. Plan  Will d/c fluids and reassess sodium response Will consider ADH, renin/aldosterone levels, and lipids Strict I/O  Urinary tract infection.  UA on admission positive for few bacteria but negative nitrates leukocytes.  Patient noted to dysuria on admission. UC without signficant growth. Plan: Continue Rocephin.  Type 2 diabetes.  Continue sliding scale insulin plus Lantus Hemoglobin E hemoglobinopathy.  Labs exhibits chronic anemia. Chronic thrombocytopenia.  No active bleeding.  Platelets stable.  Blood smear obtained and pending.   Best practice:  CODE STATUS: Full code Diet: Advance diet as tolerated, diabetic Glucose control: Sliding scale insulin plus Lantus DVT for prophylaxis: Lovenox:  Dispo: Likely discharge when tolerating p.o.  Elige Radon, MD INTERNAL MEDICINE RESIDENT PGY-1 PAGER #: [phone] 03/17/19 9:05 AM

## 2019-03-17 NOTE — Progress Notes (Signed)
Patient feeling nauseous again, per translator. Zofran given.

## 2019-03-17 NOTE — Progress Notes (Signed)
[facility] (IM) updated with pt's Hgb 7.6 and Na+ 125

## 2019-03-17 NOTE — Progress Notes (Signed)
[facility] (IM) updated about Cortrsyn IV dose not being available for admin d/t pharm not having med on unit. Once med available, lab @ [phone]) will be informed to draw lab as order states. Will communicate to on-coming RN.

## 2019-03-17 NOTE — Progress Notes (Signed)
Pt sleepy upon admission, which made admission documentation not possible. Will share the need for admission documentation to on-coming RN.

## 2019-03-17 NOTE — Progress Notes (Signed)
  Date: 03/17/2019  Patient name: Olivia Williamson  Medical record number: [id number]  Date of birth: [birth year]        I have seen and evaluated this patient and I have discussed the plan of care with the house staff. Please see their note for complete details. I concur with their findings with the following additions/corrections:   Patient here with symptomatic hyponatremia with nausea, vomiting, and stomach pain.  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 Hypothyroidism has been ruled out with normal TSH and adrenal insufficiency ruled out with normal ACTH stim test. Unclear etiology of SIADH. Medication list has been reviewed. Plan to stop IVFs today and fluid restrict to 1500 mL / day. Monitor serial BMPs. If no improvement would increase fluid restriction to 1200 mL and consider adding salt tabs.   We have stopped her antibiotics. She does have a mild non productive cough with some bibasilar abnormalities on CT chest (CXR normal). I question aspiration pneumonitis with her nausea and retching. Procalcitonin is undetectable. She also endorsed some possible symptoms of UTI, but UA was not suggestive of infection and urine culture showed insignificant growth. She is afebrile with normal white count. Will monitor off antibiotics for now.   Velna Ochs, MD 03/17/2019, 2:26 PM

## 2019-03-17 NOTE — Progress Notes (Signed)
Used translator to help assess patient. Patient states she has no pain. Plan of care explained to patient, along with medications.

## 2019-03-18 DIAGNOSIS — R109 Unspecified abdominal pain: Secondary | ICD-10-CM | POA: Diagnosis not present

## 2019-03-18 DIAGNOSIS — E222 Syndrome of inappropriate secretion of antidiuretic hormone: Secondary | ICD-10-CM | POA: Diagnosis not present

## 2019-03-18 LAB — LIPID PANEL
Cholesterol: 124 mg/dL (ref 0–200)
HDL: 46 mg/dL (ref >40)
LDL Cholesterol: 65 mg/dL (ref 0–99)
Total CHOL/HDL Ratio: 2.7 RATIO
Triglycerides: 66 mg/dL (ref <150)
VLDL: 13 mg/dL (ref 0–40)

## 2019-03-18 LAB — BASIC METABOLIC PANEL
Anion gap: 7 (ref 5–15)
Anion gap: 8 (ref 5–15)
Anion gap: 7 (ref 5–15)
BUN: 15 mg/dL (ref 8–23)
BUN: 13 mg/dL (ref 8–23)
BUN: 12 mg/dL (ref 8–23)
CO2: 23 mmol/L (ref 22–32)
CO2: 22 mmol/L (ref 22–32)
CO2: 24 mmol/L (ref 22–32)
Calcium: 8.2 mg/dL — ABNORMAL LOW (ref 8.9–10.3)
Calcium: 8.4 mg/dL — ABNORMAL LOW (ref 8.9–10.3)
Calcium: 8.7 mg/dL — ABNORMAL LOW (ref 8.9–10.3)
Chloride: 103 mmol/L (ref 98–111)
Chloride: 102 mmol/L (ref 98–111)
Chloride: 101 mmol/L (ref 98–111)
Creatinine, Ser: 0.75 mg/dL (ref 0.44–1.00)
Creatinine, Ser: 0.78 mg/dL (ref 0.44–1.00)
Creatinine, Ser: 0.86 mg/dL (ref 0.44–1.00)
GFR calc Af Amer: >60 mL/min (ref >60)
GFR calc Af Amer: >60 mL/min (ref >60)
GFR calc Af Amer: >60 mL/min (ref >60)
GFR calc non Af Amer: >60 mL/min (ref >60)
GFR calc non Af Amer: >60 mL/min (ref >60)
GFR calc non Af Amer: >60 mL/min (ref >60)
Glucose, Bld: 67 mg/dL — ABNORMAL LOW (ref 70–99)
Glucose, Bld: 211 mg/dL — ABNORMAL HIGH (ref 70–99)
Glucose, Bld: 191 mg/dL — ABNORMAL HIGH (ref 70–99)
Potassium: 4.5 mmol/L (ref 3.5–5.1)
Potassium: 4.3 mmol/L (ref 3.5–5.1)
Potassium: 4.6 mmol/L (ref 3.5–5.1)
Sodium: 133 mmol/L — ABNORMAL LOW (ref 135–145)
Sodium: 132 mmol/L — ABNORMAL LOW (ref 135–145)
Sodium: 132 mmol/L — ABNORMAL LOW (ref 135–145)

## 2019-03-18 LAB — CBC
HCT: 25.2 % — ABNORMAL LOW (ref 36.0–46.0)
Hemoglobin: 8.4 g/dL — ABNORMAL LOW (ref 12.0–15.0)
MCH: 21.3 pg — ABNORMAL LOW (ref 26.0–34.0)
MCHC: 33.3 g/dL (ref 30.0–36.0)
MCV: 63.8 fL — ABNORMAL LOW (ref 80.0–100.0)
Platelets: 101 10*3/uL — ABNORMAL LOW (ref 150–400)
RBC: 3.95 MIL/uL (ref 3.87–5.11)
RDW: 14.9 % (ref 11.5–15.5)
WBC: 4.2 10*3/uL (ref 4.0–10.5)
nRBC: 0 % (ref 0.0–0.2)

## 2019-03-18 LAB — GLUCOSE, CAPILLARY
Glucose-Capillary: 129 mg/dL — ABNORMAL HIGH (ref 70–99)
Glucose-Capillary: 174 mg/dL — ABNORMAL HIGH (ref 70–99)

## 2019-03-18 LAB — ACTH: C206 ACTH: 5 pg/mL — ABNORMAL LOW (ref 7.2–63.3)

## 2019-03-18 LAB — HEMOGLOBIN A1C
Hgb A1c MFr Bld: 6.8 % — ABNORMAL HIGH (ref 4.8–5.6)
Mean Plasma Glucose: 148 mg/dL

## 2019-03-18 MED ORDER — POTASSIUM CHLORIDE 20 MEQ PO PACK
40.0000 meq | PACK | Freq: Once | ORAL | Status: AC
  Administered 2019-03-18: 40 meq via ORAL
  Filled 2019-03-18: qty 2

## 2019-03-18 NOTE — Progress Notes (Signed)
   Subjective:  She is feeling much better today. She denies abdominal pain, nausea or vomiting today. She ate her breakfast and felt well afterward. She does endorse recent cough but no SOB.   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 Nausea and vomiting resolved. Etiology unclear although she first begin to have hyponatremia after having TB in 2017. Discussed with patient that she will need to limit fluids in the future.   - cont. Fluid restriction  - advance diet to regular  - phenergan prn.  - am BMP  TIIDM - cont. ssi insulin    VTE: lovenox  IVF: none  Diet: regular  Code: full   Dispo: Anticipated discharge in 1-2 days.   Marty Heck, DO 03/18/2019, 6:52 AM Pager: [phone]

## 2019-03-18 NOTE — Progress Notes (Signed)
Aslam (IM) informed of potassium 3.4

## 2019-03-19 ENCOUNTER — Other Ambulatory Visit: Payer: Self-pay | Admitting: Internal Medicine

## 2019-03-19 DIAGNOSIS — Z8611 Personal history of tuberculosis: Secondary | ICD-10-CM | POA: Diagnosis not present

## 2019-03-19 DIAGNOSIS — E222 Syndrome of inappropriate secretion of antidiuretic hormone: Principal | ICD-10-CM

## 2019-03-19 DIAGNOSIS — R109 Unspecified abdominal pain: Secondary | ICD-10-CM | POA: Diagnosis not present

## 2019-03-19 DIAGNOSIS — R1110 Vomiting, unspecified: Secondary | ICD-10-CM

## 2019-03-19 DIAGNOSIS — E119 Type 2 diabetes mellitus without complications: Secondary | ICD-10-CM | POA: Diagnosis not present

## 2019-03-19 DIAGNOSIS — Z91013 Allergy to seafood: Secondary | ICD-10-CM

## 2019-03-19 DIAGNOSIS — Z91018 Allergy to other foods: Secondary | ICD-10-CM

## 2019-03-19 DIAGNOSIS — R05 Cough: Secondary | ICD-10-CM | POA: Diagnosis not present

## 2019-03-19 LAB — BASIC METABOLIC PANEL
Anion gap: 7 (ref 5–15)
BUN: 18 mg/dL (ref 8–23)
CO2: 27 mmol/L (ref 22–32)
Calcium: 8.7 mg/dL — ABNORMAL LOW (ref 8.9–10.3)
Chloride: 99 mmol/L (ref 98–111)
Creatinine, Ser: 0.88 mg/dL (ref 0.44–1.00)
GFR calc Af Amer: >60 mL/min (ref >60)
GFR calc non Af Amer: >60 mL/min (ref >60)
Glucose, Bld: 122 mg/dL — ABNORMAL HIGH (ref 70–99)
Potassium: 4.3 mmol/L (ref 3.5–5.1)
Sodium: 133 mmol/L — ABNORMAL LOW (ref 135–145)

## 2019-03-19 LAB — CBC
HCT: 23.5 % — ABNORMAL LOW (ref 36.0–46.0)
Hemoglobin: 7.9 g/dL — ABNORMAL LOW (ref 12.0–15.0)
MCH: 21.6 pg — ABNORMAL LOW (ref 26.0–34.0)
MCHC: 33.6 g/dL (ref 30.0–36.0)
MCV: 64.2 fL — ABNORMAL LOW (ref 80.0–100.0)
Platelets: 98 10*3/uL — ABNORMAL LOW (ref 150–400)
RBC: 3.66 MIL/uL — ABNORMAL LOW (ref 3.87–5.11)
RDW: 15 % (ref 11.5–15.5)
WBC: 4.7 10*3/uL (ref 4.0–10.5)
nRBC: 0.4 % — ABNORMAL HIGH (ref 0.0–0.2)

## 2019-03-19 LAB — GLUCOSE, CAPILLARY: Glucose-Capillary: 131 mg/dL — ABNORMAL HIGH (ref 70–99)

## 2019-03-19 MED ORDER — LISINOPRIL 5 MG PO TABS: 10.0000 mg | ORAL_TABLET | Freq: Every day | ORAL | 0 refills | Status: DC

## 2019-03-19 NOTE — Discharge Summary (Signed)
Name: Olivia Williamson MRN: [id number] DOB: Dec 18, 1945 73 y.o. PCP: Marcine MatarJohnson, Deborah B, MD  Date of Admission: 03/16/2019  5:21 AM Date of Discharge: 03/19/2019 Attending Physician: No att. providers found  Discharge Diagnosis: 1. Hyponatremia 2/2 SIADH  Discharge Medications: Allergies as of 03/19/2019      Reactions   Shrimp [shellfish Allergy]    Sneezing and SOB   Beef-derived Products Other (See Comments)   Generalized burning sensation   Fruit & Vegetable Daily [nutritional Supplements] Other (See Comments)   Orange causes lower extremity burning      Medication List    TAKE these medications   alendronate 70 MG tablet Commonly known as: Fosamax Take 1 tablet (70 mg total) by mouth once a week. Take with a full glass of water on an empty stomach.   BD Pen Needle Nano U/F 32G X 4 MM Misc Generic drug: Insulin Pen Needle USE TO INJECT INSULIN   calcium carbonate 600 MG tablet Commonly known as: OS-CAL Take 1 tablet (600 mg total) by mouth 2 (two) times daily with a meal.   gabapentin 300 MG capsule Commonly known as: NEURONTIN Take 2 capsules (600 mg total) by mouth at bedtime.   Lantus SoloStar 100 UNIT/ML Solostar Pen Generic drug: Insulin Glargine Inject 10 Units into the skin at bedtime.   lisinopril 5 MG tablet Commonly known as: ZESTRIL Take 2 tablets (10 mg total) by mouth daily. What changed: how much to take   NovoLOG FlexPen 100 UNIT/ML FlexPen Generic drug: insulin aspart 5 units with breakfast and dinner What changed:   how much to take  how to take this  when to take this   Vitamin D (Cholecalciferol) 10 MCG (400 UNIT) Caps Take 800 Units by mouth daily.       Disposition and follow-up:   Olivia Williamson [facility] St[facility].  At the hospital follow up visit please address:  1.  Hyponatremia 2/2 SIADH: acute on chronic hyponatremia with symptoms of nausea and vomiting. Etiology unclear,  symptoms date back TB diagnosis & treatment in 2017. Symptoms resolved with resolution of hyponatremia back to patient's baseline of around 133. She was advised to limit fluid intake to three 16oz containers per day. 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 Hgb near baseline 7.9 at discharge Chronic Cough: History of tuberculosis tx through health department in 2017. Some changes on CT chest compared to prior. Symptoms and clinical picture inconsistent with pneumonia. Assess symptoms at follow-up.  2.  Labs / imaging needed at time of follow-up: BMP, CBC  3.  Pending labs/ test needing follow-up: none  Follow-up Appointments:   Hospital Course by problem list: 1. Hyponatremia 2/2 SIADH 2. Chronic Cough Olivia Williamson is a 73yo Montagnard speaking female with history of TIIDM, hemoglobin E, tuberculosis treated in 2017, chronic intermittent mild hyponatremia who presented with nausea and vomiting. She additionally endorsed chronic cough that seemed to be slightly worsened recently and was started on antibiotics with concern for pneumonia. These were held as CT chest was similar to previous and chronic in appearance although there was some minor increased changes compared to prior, they did not appear consistent with acute pneumonia nor did clinical picture. She was found to have acute on chronic hyponatremia. Work-up for other etiologies were ruled out and symptoms appeared to be related to her hyponatremia, which was initially consistent with adrenal insufficiency or SIADH. In addition, sodium did not  respond to initial fluid resuscitation. Cortisol and ACTH stimulation test were within normal limits. She was placed on a fluid restricted diet of less than 1500cc per day with quick resolution of her symptoms and hyponatremia back to her baseline of 133. She and her sone were educated on the importance of fluid restriction to three 16oz containers of fluid per day. Etiology  unclear although she appears to have had symptomatic hyponatremia in 2017 when diagnosed with tuberculosis as well. This was treated completed through the health department. Although recurrence or reinfection is rare, it may be a possibility as she did endorse recent cough although denied this as well and some parts of history were limited due to language barrier.   Discharge Vitals:   BP (!) 150/73 (BP Location: Left Arm)    Pulse 72    Temp 98.4 F (36.9 C) (Oral)    Resp 18    Wt 39.6 kg    LMP 05/11/2016    SpO2 99%    BMI 17.63 kg/m   Pertinent Labs, Studies, and Procedures:   ACTH Stimulation:  Base: 11.2 Cortisol 30 min: 23.8 Cortisol 60 Min: 26.1  Procalcitonin <.10  Serum Osm: 267 Urine Na: 142 Urine Osms: 505   CT Abdomen IMPRESSION: New focal consolidation in the anterior aspect of the right lower lobe.  Chronic changes similar to that noted on prior exam. No acute abnormality is seen. Electronically Signed   By: Inez Catalina M.D.   On: 03/16/2019 10:16  CT Chest 10/22 IMPRESSION: 1. Small amount of airspace consolidation in the right lower lobe concerning for pneumonia. Additional ill-defined areas of ground-glass attenuation throughout the lower lobes of the lungs bilaterally, nonspecific, but potentially additional areas of infection/inflammation. 2. Trace right pleural effusion lying dependently. 3. Dilatation of the pulmonic trunk (3.7 cm in diameter), concerning for pulmonary arterial hypertension. 4. Aortic atherosclerosis.  Aortic Atherosclerosis (ICD10-I70.0). Electronically Signed   By: Vinnie Langton M.D.  Discharge Instructions: Discharge Instructions    Diet - low sodium heart healthy   Complete by: As directed    Discharge instructions   Complete by: As directed    You were hospitalized for low sodium. Thank you for allowing [place] to be part of your care.   We arranged for you to follow up at:  The internal medicine clinic will call  you. Please call the clinic if you do not receive a call tomorrow.  [facility] Address: [facility], [place], [place], [place] [address] [phone]  Please make sure to drink less than 48 oz of water per day. This means drinking less than three 16 oz containers.    Please note these changes made to your medications:   Please START taking:   Please increase your lisinopril to 10 mg per day - take 2 tablets per day.   Please call our clinic if you have any questions or concerns, we may be able to help and keep you from a long and expensive emergency room wait. Our clinic and after hours phone number is [phone], the best time to call is Monday through Friday 9 am to 4 pm but there is always someone available 24/7 if you have an emergency. If you need medication refills please notify your pharmacy one week in advance and they will send [place] a request.   Increase activity slowly   Complete by: As directed       Signed:  Versie Starks, DO 03/19/2019, 9:19 PM   Pager: [phone]

## 2019-03-19 NOTE — Progress Notes (Signed)
AVS reviewed with patient and son through interpreter, and patient given a copy. All lines removed, patient dressed, belongings packed, and patient taken via wheelchair to son's car for discharge.

## 2019-03-19 NOTE — Progress Notes (Signed)
   Subjective:  She is tolerating a regular diet. No recurrence of nausea or vomiting. She denies SOB. Discussed that she will need to restrict fluids with her and son and both endorsed understanding.   Objective:  Vital signs in last 24 hours: Vitals:   03/18/19 2120 03/18/19 2156 03/18/19 2338 03/19/19 0329  BP: (!) 184/75 (!) 170/75 (!) 153/75 (!) 153/62  Pulse: 76 82 97 72  Resp:   16 16  Temp:   98.1 F (36.7 C) 98.7 F (37.1 C)  TempSrc:   Oral Oral  SpO2:   96% 100%  Weight:       Constitution: NAD, sitting up in bed Cardio: RRR, no m/r/g Respiratory: ctab, no w/r/r Abdominal: soft, non-distended, NTTP, nml bs Neuro: a&o, normal affect  Assessment/Plan:  Principal Problem:   SIADH (syndrome of inappropriate ADH production) (HCC) Active Problems:   Diabetes mellitus with neurological manifestations (HCC)   History of tuberculosis   Community acquired pneumonia   Hyponatremia  73 year old female who presented to the ED on 03/16/2019 for evaluation of several days of epigastric pain, nausea, and vomitingwho was found to have acute hyponatremia.  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 Nausea and vomiting resolved. Etiology unclear although she first begin to have hyponatremia after having TB in 2017. Discussed with patient and son that she will need to limit fluids to three 16 oz containers in the future.   - cont. Fluid restriction  - cont. Regular diet - phenergan prn.   TIIDM - cont. ssi insulin   VTE: lovenox  IVF: none  Diet: regular  Code: full   Dispo: Anticipated discharge in 1-2 days.   Marty Heck, DO 03/18/2019, 6:52 AM Pager: [phone]

## 2019-03-22 LAB — GLUCOSE, CAPILLARY
Glucose-Capillary: 157 mg/dL — ABNORMAL HIGH (ref 70–99)
Glucose-Capillary: 215 mg/dL — ABNORMAL HIGH (ref 70–99)
Glucose-Capillary: 108 mg/dL — ABNORMAL HIGH (ref 70–99)

## 2019-03-24 ENCOUNTER — Other Ambulatory Visit: Payer: Self-pay | Admitting: Internal Medicine

## 2019-03-24 DIAGNOSIS — E1142 Type 2 diabetes mellitus with diabetic polyneuropathy: Secondary | ICD-10-CM

## 2019-03-24 DIAGNOSIS — Z794 Long term (current) use of insulin: Secondary | ICD-10-CM

## 2019-04-13 ENCOUNTER — Other Ambulatory Visit: Payer: Self-pay

## 2019-04-13 ENCOUNTER — Encounter: Payer: Self-pay | Admitting: Internal Medicine

## 2019-04-13 ENCOUNTER — Ambulatory Visit: Payer: Medicare Other | Attending: Internal Medicine | Admitting: Internal Medicine

## 2019-04-13 VITALS — BP 201/98 | HR 68 | Temp 98.1°F | Resp 16 | Wt 90.8 lb

## 2019-04-13 DIAGNOSIS — Z79899 Other long term (current) drug therapy: Secondary | ICD-10-CM | POA: Insufficient documentation

## 2019-04-13 DIAGNOSIS — R9389 Abnormal findings on diagnostic imaging of other specified body structures: Secondary | ICD-10-CM | POA: Diagnosis not present

## 2019-04-13 DIAGNOSIS — R05 Cough: Secondary | ICD-10-CM | POA: Insufficient documentation

## 2019-04-13 DIAGNOSIS — R053 Chronic cough: Secondary | ICD-10-CM

## 2019-04-13 DIAGNOSIS — D696 Thrombocytopenia, unspecified: Secondary | ICD-10-CM | POA: Diagnosis not present

## 2019-04-13 DIAGNOSIS — E871 Hypo-osmolality and hyponatremia: Secondary | ICD-10-CM | POA: Diagnosis not present

## 2019-04-13 DIAGNOSIS — E042 Nontoxic multinodular goiter: Secondary | ICD-10-CM | POA: Diagnosis not present

## 2019-04-13 DIAGNOSIS — M546 Pain in thoracic spine: Secondary | ICD-10-CM | POA: Insufficient documentation

## 2019-04-13 DIAGNOSIS — E1165 Type 2 diabetes mellitus with hyperglycemia: Secondary | ICD-10-CM | POA: Diagnosis not present

## 2019-04-13 DIAGNOSIS — Z8611 Personal history of tuberculosis: Secondary | ICD-10-CM | POA: Insufficient documentation

## 2019-04-13 DIAGNOSIS — Z794 Long term (current) use of insulin: Secondary | ICD-10-CM | POA: Diagnosis not present

## 2019-04-13 DIAGNOSIS — Z09 Encounter for follow-up examination after completed treatment for conditions other than malignant neoplasm: Secondary | ICD-10-CM

## 2019-04-13 DIAGNOSIS — IMO0002 Reserved for concepts with insufficient information to code with codable children: Secondary | ICD-10-CM

## 2019-04-13 DIAGNOSIS — I10 Essential (primary) hypertension: Secondary | ICD-10-CM | POA: Diagnosis not present

## 2019-04-13 DIAGNOSIS — D649 Anemia, unspecified: Secondary | ICD-10-CM | POA: Insufficient documentation

## 2019-04-13 DIAGNOSIS — M5416 Radiculopathy, lumbar region: Secondary | ICD-10-CM | POA: Insufficient documentation

## 2019-04-13 DIAGNOSIS — R809 Proteinuria, unspecified: Secondary | ICD-10-CM

## 2019-04-13 LAB — GLUCOSE, POCT (MANUAL RESULT ENTRY): POC Glucose: 191 mg/dl — AB (ref 70–99)

## 2019-04-13 MED ORDER — LOSARTAN POTASSIUM 25 MG PO TABS: 25.0000 mg | ORAL_TABLET | Freq: Every day | ORAL | 6 refills | Status: DC

## 2019-04-13 MED ORDER — AMLODIPINE BESYLATE 5 MG PO TABS: 5.0000 mg | ORAL_TABLET | Freq: Every day | ORAL | 6 refills | Status: DC

## 2019-04-13 MED ORDER — OMEPRAZOLE 20 MG PO CPDR: 20.0000 mg | DELAYED_RELEASE_CAPSULE | Freq: Every day | ORAL | 3 refills | Status: DC

## 2019-04-13 NOTE — Progress Notes (Signed)
Patient ID: Sibyl Mikula, female    DOB: 1946-01-28  MRN: [id number]  CC: Diabetes and Hypertension   Subjective: Vitalia Stough is a 73 y.o. female who presents for hosp f/u.  Son, [place] and interpreter, Mardee Postin, from [employer] are with her Her concerns today include:  TB (treated through HD, completed treatment 08/2016), DM type 2 with neuropathy, thyroid nodules,andanemia duetoalpha thalassemmia mutation, osteoporosis, lumbar radiculopathy  Patient hospitalized 10/22-20 09/2018 with symptoms of nausea and vomiting.  Found to have hyponatremia with sodium of 125.  She was screened for adrenal insufficiency and results negative.  Thought to have SIADH.  Patient advised on fluid restriction of no more than three 16 ounce containers of fluids per day.  Today patient tells me that she has been sticking to the fluid restriction.  Patient also complains of chronic cough.  She has past history of tuberculosis that was treated through the health department in 2017.  She was placed on antibiotics for concern of pneumonia.  CT of the chest revealed small amount of airspace consolidation in the right lower lobe concerning for pneumonia with ill-defined areas of groundglass attenuation throughout the lower lobes of the lungs bilaterally which could be due to infection/inflammation. Today she tells me that she has had this dry cough for over a year and that it is a little better..  She has not had any recent changes in her weight, no fever or night sweats.  No drainage of mucus at the back of the throat.  No nasal congestion.  Denies any heartburn symptoms.  Of note when I last saw her in September, she had macroalbumin of 712.  I recommended starting lisinopril.  She never got this message but lisinopril was started in the hospital due to elevated blood pressure.  She does not have medications with her today but states she has been taking the lisinopril.  While in the hospital, her anemia was a little worse but by the time  of discharge it was back to baseline of 8.4.  She has hemoglobin E.  She has had mild thrombocytopenia with anemia.  I had referred her to hematology/oncology in August of this year but looks like appointment was never made.  Patient Active Problem List   Diagnosis Date Noted  . Non-intractable vomiting   . SIADH (syndrome of inappropriate ADH production) (HCC) 03/18/2019  . Hyponatremia   . Community acquired pneumonia 03/16/2019  . Positive for macroalbuminuria 02/05/2019  . Influenza vaccine needed 02/02/2019  . 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 Lumbar radiculopathy 02/02/2017  . Chronic left-sided thoracic back pain 02/02/2017  . 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 Multinodular goiter 11/01/2016  . Thyroid nodule 04/13/2016  . Abnormal CT of the chest   . History of tuberculosis 01/09/2016  . Protein-calorie malnutrition, severe 12/16/2015  . Generalized weakness 12/15/2015  . GERD (gastroesophageal reflux disease)   . Diabetes mellitus with neurological manifestations [facility])      Current Outpatient Medications on File Prior to Visit  Medication Sig Dispense Refill  . alendronate (FOSAMAX) 70 MG tablet Take 1 tablet (70 mg total) by mouth once a week. Take with a full glass of water on an empty stomach. (Patient not taking: Reported on 12/06/2018) 12 tablet 6  . BD PEN NEEDLE NANO U/F 32G X 4 MM MISC USE TO INJECT INSULIN 100 each 2  . calcium carbonate (OS-CAL) 600 MG tablet Take 1 tablet (600 mg total) by  mouth 2 (two) times daily with a meal. 60 tablet 6  . gabapentin (NEURONTIN) 300 MG capsule Take 2 capsules (600 mg total) by mouth at bedtime. 60 capsule 6  . insulin aspart (NOVOLOG FLEXPEN) 100 UNIT/ML FlexPen 5 units with breakfast and dinner (Patient taking differently: Inject 5 Units into the skin 2 (two) times daily with a meal. 5 units with breakfast and dinner) 15 mL 11  . Insulin Glargine (LANTUS SOLOSTAR) 100  UNIT/ML Solostar Pen Inject 10 Units into the skin at bedtime. 5 pen PRN  . lisinopril (ZESTRIL) 5 MG tablet Take 2 tablets (10 mg total) by mouth daily. 60 tablet 0  . Vitamin D, Cholecalciferol, 10 MCG (400 UNIT) CAPS Take 800 Units by mouth daily. 120 capsule 6   No current facility-administered medications on file prior to visit.     Allergies  Allergen Reactions  . Shrimp [Shellfish Allergy]     Sneezing and SOB  . Beef-Derived Products Other (See Comments)    Generalized burning sensation  . Fruit & Vegetable Daily [Nutritional Supplements] Other (See Comments)    Orange causes lower extremity burning    Social History   Socioeconomic History  . Marital status: Widowed    Spouse name: Not on file  . Number of children: Not on file  . Years of education: Not on file  . Highest education level: Not on file  Occupational History  . Not on file  Social Needs  . Financial resource strain: Not on file  . Food insecurity    Worry: Not on file    Inability: Not on file  . Transportation needs    Medical: Not on file    Non-medical: Not on file  Tobacco Use  . Smoking status: Never Smoker  . Smokeless tobacco: Never Used  Substance and Sexual Activity  . Alcohol use: No  . Drug use: No  . Sexual activity: Not on file  Lifestyle  . Physical activity    Days per week: Not on file    Minutes per session: Not on file  . Stress: Not on file  Relationships  . Social Musician on phone: Not on file    Gets together: Not on file    Attends religious service: Not on file    Active member of club or organization: Not on file    Attends meetings of clubs or organizations: Not on file    Relationship status: Not on file  . Intimate partner violence    Fear of current or ex partner: Not on file    Emotionally abused: Not on file    Physically abused: Not on file    Forced sexual activity: Not on file  Other Topics Concern  . Not on file  Social History  Narrative  . Not on file    Family History  Problem Relation Age of Onset  . Heart disease Maternal Uncle 71    Past Surgical History:  Procedure Laterality Date  . ABDOMINAL HYSTERECTOMY    . CHOLECYSTECTOMY N/A 05/02/2014   Procedure: LAPAROSCOPIC CHOLECYSTECTOMY ;  Surgeon: Abigail Miyamoto, MD;  Location: [facility] OR;  Service: General;  Laterality: N/A;  laparoscopic cholecystectomy  . COLONOSCOPY    . NO PAST SURGERIES    . VIDEO BRONCHOSCOPY Bilateral 01/10/2016   Procedure: VIDEO BRONCHOSCOPY WITHOUT FLUORO;  Surgeon: Leslye Peer, MD;  Location[facility] ENDOSCOPY;  Service: Cardiopulmonary;  Laterality: Bilateral;    ROS: Review of Systems Negative except  as stated above  PHYSICAL EXAM: BP (!) 201/98 (BP Location: Left Arm, Patient Position: Sitting, Cuff Size: Small)   Pulse 68   Temp 98.1 F (36.7 C) (Oral)   Resp 16   Wt 90 lb 12.8 oz (41.2 kg)   LMP 05/11/2016   SpO2 100%   BMI 18.34 kg/m   Wt Readings from Last 3 Encounters:  04/13/19 90 lb 12.8 oz (41.2 kg)  03/19/19 87 lb 4.8 oz (39.6 kg)  02/02/19 88 lb (39.9 kg)   Physical Exam  General appearance -patient is a frail elderly female in NAD Mental status -she answers most questions appropriately but is a difficult historian.   Mouth -oral mucosa is moist.   Neck - supple, no significant adenopathy Chest -breath sounds are slightly decreased throughout Heart - normal rate, regular rhythm, normal S1, S2, no murmurs, rubs, clicks or gallops Extremities - peripheral pulses normal, no pedal edema, no clubbing or cyanosis   CMP Latest Ref Rng & Units 03/19/2019 03/18/2019 03/18/2019  Glucose 70 - 99 mg/dL 122(H) 191(H) 211(H)  BUN 8 - 23 mg/dL 18 12 13   Creatinine 0.44 - 1.00 mg/dL 0.88 0.86 0.78  Sodium 135 - 145 mmol/L 133(L) 132(L) 132(L)  Potassium 3.5 - 5.1 mmol/L 4.3 4.6 4.3  Chloride 98 - 111 mmol/L 99 101 102  CO2 22 - 32 mmol/L 27 24 22   Calcium 8.9 - 10.3 mg/dL 8.7(L) 8.7(L) 8.4(L)  Total Protein  6.5 - 8.1 g/dL - - -  Total Bilirubin 0.3 - 1.2 mg/dL - - -  Alkaline Phos 38 - 126 U/L - - -  AST 15 - 41 U/L - - -  ALT 0 - 44 U/L - - -   Lipid Panel     Component Value Date/Time   CHOL 124 03/18/2019 0358   TRIG 66 03/18/2019 0358   HDL 46 03/18/2019 0358   CHOLHDL 2.7 03/18/2019 0358   VLDL 13 03/18/2019 0358   LDLCALC 65 03/18/2019 0358    CBC    Component Value Date/Time   WBC 4.7 03/19/2019 0352   RBC 3.66 (L) 03/19/2019 0352   HGB 7.9 (L) 03/19/2019 0352   HGB 9.4 (L) 10/27/2016 1632   HCT 23.5 (L) 03/19/2019 0352   HCT 29.7 (L) 10/27/2016 1632   PLT 98 (L) 03/19/2019 0352   PLT 118 (L) 10/27/2016 1632   MCV 64.2 (L) 03/19/2019 0352   MCV 68 (L) 10/27/2016 1632   MCH 21.6 (L) 03/19/2019 0352   MCHC 33.6 03/19/2019 0352   RDW 15.0 03/19/2019 0352   RDW 16.9 (H) 10/27/2016 1632   LYMPHSABS 1.4 12/06/2018 1408   MONOABS 0.2 12/06/2018 1408   EOSABS 0.1 12/06/2018 1408   BASOSABS 0.0 12/06/2018 1408    ASSESSMENT AND PLAN: 1. Hospital discharge follow-up 2. Hyponatremia Thought most likely to be due to SIADH.  She will continue fluid restriction.  Recheck BMP today.  Refer to endocrinology - Ambulatory referral to Endocrinology - Basic metabolic panel  3. Chronic cough Discontinue lisinopril.  Will use losartan instead. We will also give a trial of a PPI. Case is rather complex.  Patient has had past history of TB and current abnormal CT scan.  We will get her in with Dr. Joya Gaskins to help with evaluation and best course of action - omeprazole (PRILOSEC) 20 MG capsule; Take 1 capsule (20 mg total) by mouth daily.  Dispense: 30 capsule; Refill: 3  4. Abnormal chest CT See #3 above  5.  Essential hypertension Change lisinopril to losartan.  Add low-dose amlodipine.  Follow-up with clinical pharmacist in 1 week for repeat blood pressure check.  If still elevated and no lower extremity edema from amlodipine, I recommend increasing the dose of amlodipine to 10 mg  - losartan (COZAAR) 25 MG tablet; Take 1 tablet (25 mg total) by mouth daily.  Dispense: 30 tablet; Refill: 6 - amLODipine (NORVASC) 5 MG tablet; Take 1 tablet (5 mg total) by mouth daily.  Dispense: 30 tablet; Refill: 6  6. Diabetes mellitus type 2, uncontrolled, with complications (HCC) This was not addressed today but her A1c 3 weeks ago was 6.8 which is much improved - Glucose (CBG)  7. Chronic anemia - Ambulatory referral to Hematology - CBC  8. Thrombocytopenia (HCC) - Ambulatory referral to Hematology - CBC      Patient was given the opportunity to ask questions.  Patient verbalized understanding of the plan and was able to repeat key elements of the plan.   Orders Placed This Encounter  Procedures  . Glucose (CBG)     Requested Prescriptions    No prescriptions requested or ordered in this encounter    No follow-ups on file.  Jonah Blueeborah Rumi Kolodziej, MD, FACP

## 2019-04-13 NOTE — Patient Instructions (Signed)
Please give patient an appointment with the clinical pharmacist in 1 week for repeat blood pressure check.  Please give patient an appointment with Dr. Joya Gaskins in 1-3 weeks for evaluation of cough.

## 2019-04-14 LAB — BASIC METABOLIC PANEL
BUN/Creatinine Ratio: 24 (ref 12–28)
BUN: 25 mg/dL (ref 8–27)
CO2: 25 mmol/L (ref 20–29)
Calcium: 9.6 mg/dL (ref 8.7–10.3)
Chloride: 96 mmol/L (ref 96–106)
Creatinine, Ser: 1.06 mg/dL — ABNORMAL HIGH (ref 0.57–1.00)
GFR calc Af Amer: 61 mL/min/{1.73_m2} (ref >59)
GFR calc non Af Amer: 53 mL/min/{1.73_m2} — ABNORMAL LOW (ref >59)
Glucose: 159 mg/dL — ABNORMAL HIGH (ref 65–99)
Potassium: 5.2 mmol/L (ref 3.5–5.2)
Sodium: 136 mmol/L (ref 134–144)

## 2019-04-14 LAB — CBC

## 2019-04-16 ENCOUNTER — Other Ambulatory Visit: Payer: Self-pay | Admitting: Internal Medicine

## 2019-04-16 DIAGNOSIS — D649 Anemia, unspecified: Secondary | ICD-10-CM

## 2019-04-16 NOTE — Progress Notes (Signed)
cbc

## 2019-04-17 ENCOUNTER — Telehealth: Payer: Self-pay | Admitting: Hematology and Oncology

## 2019-04-17 ENCOUNTER — Encounter: Payer: Self-pay | Admitting: Hematology and Oncology

## 2019-04-17 NOTE — Telephone Encounter (Signed)
A new hem appt has been scheduled for Olivia Williamson to see Dr. Lorenso Courier on 12/15 at 2pm. Letter mailed to the pt.

## 2019-04-19 ENCOUNTER — Other Ambulatory Visit: Payer: Self-pay

## 2019-04-19 ENCOUNTER — Encounter: Payer: Self-pay | Admitting: Pharmacist

## 2019-04-19 ENCOUNTER — Ambulatory Visit: Payer: Medicare Other | Attending: Internal Medicine | Admitting: Pharmacist

## 2019-04-19 VITALS — BP 165/81 | HR 81

## 2019-04-19 DIAGNOSIS — I10 Essential (primary) hypertension: Secondary | ICD-10-CM | POA: Insufficient documentation

## 2019-04-19 DIAGNOSIS — Z8249 Family history of ischemic heart disease and other diseases of the circulatory system: Secondary | ICD-10-CM | POA: Insufficient documentation

## 2019-04-19 DIAGNOSIS — Z79899 Other long term (current) drug therapy: Secondary | ICD-10-CM | POA: Insufficient documentation

## 2019-04-19 DIAGNOSIS — D649 Anemia, unspecified: Secondary | ICD-10-CM

## 2019-04-19 NOTE — Progress Notes (Signed)
   S:    PCP: Dr. Wynetta Emery  Patient arrives in good spirits. Presents to the clinic for hypertension evaluation, counseling, and management.  Patient was referred and last seen by Primary Care Provider on 04/13/19. BP was 201/98 at that visit. Dr. Wynetta Emery started low dose amlodipine along with low dose losartan.     Patient reports adherence with medications.  Denies chest pain, palpitations or dyspnea. Denies HA or blurred vision. No BLE edema.   Current BP Medications include:  Amlodipine 5 mg daily, losartan 25 mg daily  Dietary habits include: reports that she does not use salt; denies drinking caffeine  Exercise habits include: walks daily with the use of her cane  Family / Social history:  - FHx: heart disease - Tobacco: never smoker - Alcohol: denies use  O:  Vitals:   04/19/19 1437  BP: (!) 165/81  Pulse: 81   Home BP readings: reports 150s/80s  Last 3 Office BP readings: BP Readings from Last 3 Encounters:  04/19/19 (!) 165/81  04/13/19 (!) 201/98  03/19/19 (!) 150/73    BMET    Component Value Date/Time   NA 136 04/13/2019 1620   K 5.2 04/13/2019 1620   CL 96 04/13/2019 1620   CO2 25 04/13/2019 1620   GLUCOSE 159 (H) 04/13/2019 1620   GLUCOSE 122 (H) 03/19/2019 0352   BUN 25 04/13/2019 1620   CREATININE 1.06 (H) 04/13/2019 1620   CREATININE 0.66 06/19/2016 0838   CALCIUM 9.6 04/13/2019 1620   GFRNONAA 53 (L) 04/13/2019 1620   GFRNONAA >89 12/30/2015 1210   GFRAA 61 04/13/2019 1620   GFRAA >89 12/30/2015 1210    Renal function: CrCl cannot be calculated (Unknown ideal weight.).  Clinical ASCVD: No  The ASCVD Risk score Mikey Bussing DC Jr., et al., 2013) failed to calculate for the following reasons:   The valid total cholesterol range is 130 to 320 mg/dL   A/P: Hypertension longstanding currently uncontroled on current medications. SBP Goal = 130 mmHg. Patient is adherent to medications and has been taking now for ~1 week. She is tolerating them well.  Even though her BP is still above goal, it has improved since last week. Additionally, she sees a provider in ~2 weeks. I recommend to hold off on any medication changes until then. -Continued current regimen.  -F/u labs ordered - CBC per PCP -Counseled on lifestyle modifications for blood pressure control including reduced dietary sodium, increased exercise, adequate sleep  Results reviewed and written information provided.   Total time in face-to-face counseling 15 minutes.   F/U Clinic Visit in 2 weeks with Dr. Joya Gaskins.  Benard Halsted, PharmD, [place] [phone]

## 2019-04-19 NOTE — Patient Instructions (Signed)
Thank you for coming to see us today.   Blood pressure today is improving.  Continue taking blood pressure medications as prescribed.   Limiting salt and caffeine, as well as exercising as able for at least 30 minutes for 5 days out of the week, can also help you lower your blood pressure.  Take your blood pressure at home if you are able. Please write down these numbers and bring them to your visits.  If you have any questions about medications, please call me [phone].  Luke  

## 2019-04-20 LAB — CBC
Hematocrit: 29.5 % — ABNORMAL LOW (ref 34.0–46.6)
Hemoglobin: 9 g/dL — ABNORMAL LOW (ref 11.1–15.9)
MCH: 21.7 pg — ABNORMAL LOW (ref 26.6–33.0)
MCHC: 30.5 g/dL — ABNORMAL LOW (ref 31.5–35.7)
MCV: 71 fL — ABNORMAL LOW (ref 79–97)
Platelets: 88 10*3/uL — CL (ref 150–450)
RBC: 4.15 x10E6/uL (ref 3.77–5.28)
RDW: 16.6 % — ABNORMAL HIGH (ref 11.7–15.4)
WBC: 3 10*3/uL — ABNORMAL LOW (ref 3.4–10.8)

## 2019-04-21 ENCOUNTER — Telehealth: Payer: Self-pay | Admitting: Internal Medicine

## 2019-04-24 NOTE — Telephone Encounter (Signed)
Tried contacting pt son to go over Dr. Wynetta Emery message pt son didn't answer and was unable to lvm due to vm not being setup.

## 2019-04-26 NOTE — Telephone Encounter (Signed)
Patient has an appointment 05-09-19 @2pm   [facility] mailed a letter with appointment details

## 2019-05-02 ENCOUNTER — Encounter: Payer: Self-pay | Admitting: Critical Care Medicine

## 2019-05-02 ENCOUNTER — Other Ambulatory Visit: Payer: Self-pay

## 2019-05-02 ENCOUNTER — Ambulatory Visit: Payer: Medicare Other | Attending: Critical Care Medicine | Admitting: Critical Care Medicine

## 2019-05-02 ENCOUNTER — Other Ambulatory Visit: Payer: Self-pay | Admitting: Internal Medicine

## 2019-05-02 VITALS — BP 192/79 | HR 77 | Temp 98.0°F | Resp 16 | Wt 92.8 lb

## 2019-05-02 DIAGNOSIS — I10 Essential (primary) hypertension: Secondary | ICD-10-CM | POA: Insufficient documentation

## 2019-05-02 DIAGNOSIS — J189 Pneumonia, unspecified organism: Secondary | ICD-10-CM | POA: Diagnosis not present

## 2019-05-02 DIAGNOSIS — R0982 Postnasal drip: Secondary | ICD-10-CM | POA: Diagnosis not present

## 2019-05-02 DIAGNOSIS — M1990 Unspecified osteoarthritis, unspecified site: Secondary | ICD-10-CM | POA: Diagnosis not present

## 2019-05-02 DIAGNOSIS — J3089 Other allergic rhinitis: Secondary | ICD-10-CM | POA: Insufficient documentation

## 2019-05-02 DIAGNOSIS — R079 Chest pain, unspecified: Secondary | ICD-10-CM | POA: Insufficient documentation

## 2019-05-02 DIAGNOSIS — Z8611 Personal history of tuberculosis: Secondary | ICD-10-CM | POA: Diagnosis not present

## 2019-05-02 DIAGNOSIS — E119 Type 2 diabetes mellitus without complications: Secondary | ICD-10-CM | POA: Diagnosis not present

## 2019-05-02 DIAGNOSIS — R0981 Nasal congestion: Secondary | ICD-10-CM | POA: Insufficient documentation

## 2019-05-02 DIAGNOSIS — Z79899 Other long term (current) drug therapy: Secondary | ICD-10-CM | POA: Diagnosis not present

## 2019-05-02 DIAGNOSIS — K056 Periodontal disease, unspecified: Secondary | ICD-10-CM | POA: Insufficient documentation

## 2019-05-02 DIAGNOSIS — K219 Gastro-esophageal reflux disease without esophagitis: Secondary | ICD-10-CM | POA: Insufficient documentation

## 2019-05-02 DIAGNOSIS — D649 Anemia, unspecified: Secondary | ICD-10-CM | POA: Diagnosis not present

## 2019-05-02 DIAGNOSIS — Z794 Long term (current) use of insulin: Secondary | ICD-10-CM | POA: Insufficient documentation

## 2019-05-02 DIAGNOSIS — J90 Pleural effusion, not elsewhere classified: Secondary | ICD-10-CM | POA: Insufficient documentation

## 2019-05-02 DIAGNOSIS — R9389 Abnormal findings on diagnostic imaging of other specified body structures: Secondary | ICD-10-CM | POA: Diagnosis not present

## 2019-05-02 DIAGNOSIS — R05 Cough: Secondary | ICD-10-CM | POA: Diagnosis present

## 2019-05-02 DIAGNOSIS — J309 Allergic rhinitis, unspecified: Secondary | ICD-10-CM | POA: Insufficient documentation

## 2019-05-02 DIAGNOSIS — R053 Chronic cough: Secondary | ICD-10-CM

## 2019-05-02 DIAGNOSIS — I700 Atherosclerosis of aorta: Secondary | ICD-10-CM | POA: Diagnosis not present

## 2019-05-02 MED ORDER — BENZONATATE 100 MG PO CAPS: 200.0000 mg | ORAL_CAPSULE | Freq: Three times a day (TID) | ORAL | 0 refills | Status: DC | PRN

## 2019-05-02 MED ORDER — LOSARTAN POTASSIUM 100 MG PO TABS: 100.0000 mg | ORAL_TABLET | Freq: Every day | ORAL | 3 refills | Status: DC

## 2019-05-02 MED ORDER — PREDNISONE 10 MG PO TABS: ORAL_TABLET | ORAL | 0 refills | Status: DC

## 2019-05-02 MED ORDER — FLUTICASONE PROPIONATE 50 MCG/ACT NA SUSP: 2.0000 | Freq: Every day | NASAL | 6 refills | Status: DC

## 2019-05-02 NOTE — Patient Instructions (Addendum)
Increase losartan 100mg  daily    Start benzonatate one three times daily as needed for cough  Start flonase two puff each nostril daily  Start prednisone 10mg  three a day for three days then stop  A Chest xray will be obtained  Return Dr Joya Gaskins 3- 4  weeks

## 2019-05-02 NOTE — Assessment & Plan Note (Signed)
The patient only has a few remaining teeth in both upper and lowers however has significant periodontal disease in these areas.    Will give the patient f/u with dental care hygiene

## 2019-05-02 NOTE — Progress Notes (Signed)
Subjective:    Patient ID: Olivia Williamson, female    DOB: [birth year], 73 y.o.   MRN: [id number]  History of Present Illness: 73 y.o.M [place].  Here for evaluation of cough.note the grandson in law served as an interpreter for this visit and has excellent English   this patient was admitted in October with right lower lobe pneumonia and known prior history of tuberculosis.  During that admission it was not discerned the patient had active tuberculosis. The patient was treated with intravenous antibiotics and oral antibiotics which she has now finished.  The patient still has a chronic cough that is mostly dry at this time.  There is postnasal drainage and anterior nasal drainage.  She has some nasal congestion as well.  The patient is referred by primary care for evaluation of the chronic cough The patient is a non-smoker does have hypertension today comes in 192/79  Discharge summary from recent admission was reviewed note the patient has hemoglobin E and has chronic anemia from this  Cough This is a chronic problem. The current episode started 1 to 4 weeks ago. The problem has been unchanged. The cough is non-productive. Associated symptoms include chest pain, headaches, postnasal drip, rhinorrhea and shortness of breath. Pertinent negatives include no ear congestion, ear pain, fever, heartburn, hemoptysis, nasal congestion, sore throat or wheezing. Associated symptoms comments: Front and back. The symptoms are aggravated by cold air, exercise and lying down. She has tried nothing for the symptoms. Her past medical history is significant for pneumonia.   Past Medical History:  Diagnosis Date  . Arthritis   . Diabetes mellitus without complication (HCC)    stopped DM meds 2004  . GERD (gastroesophageal reflux disease)   . Hypertension    meds stopped 1 week ago , need to get renewed  . Tuberculosis    ACTIVE     Family History  Problem Relation Age of Onset  . Heart disease Maternal  Uncle [id number]     Social History   Socioeconomic History  . Marital status: Widowed    Spouse name: Not on file  . Number of children: Not on file  . Years of education: Not on file  . Highest education level: Not on file  Occupational History  . Not on file  Social Needs  . Financial resource strain: Not on file  . Food insecurity    Worry: Not on file    Inability: Not on file  . Transportation needs    Medical: Not on file    Non-medical: Not on file  Tobacco Use  . Smoking status: Never Smoker  . Smokeless tobacco: Never Used  Substance and Sexual Activity  . Alcohol use: No  . Drug use: No  . Sexual activity: Not on file  Lifestyle  . Physical activity    Days per week: Not on file    Minutes per session: Not on file  . Stress: Not on file  Relationships  . Social Musicianconnections    Talks on phone: Not on file    Gets together: Not on file    Attends religious service: Not on file    Active member of club or organization: Not on file    Attends meetings of clubs or organizations: Not on file    Relationship status: Not on file  . Intimate partner violence    Fear of current or ex partner: Not on file    Emotionally abused: Not on file    Physically  abused: Not on file    Forced sexual activity: Not on file  Other Topics Concern  . Not on file  Social History Narrative  . Not on file     Allergies  Allergen Reactions  . Shrimp [Shellfish Allergy]     Sneezing and SOB  . Beef-Derived Products Other (See Comments)    Generalized burning sensation  . Fruit & Vegetable Daily [Nutritional Supplements] Other (See Comments)    Orange causes lower extremity burning     Outpatient Medications Prior to Visit  Medication Sig Dispense Refill  . amLODipine (NORVASC) 5 MG tablet Take 1 tablet (5 mg total) by mouth daily. 30 tablet 6  . BD PEN NEEDLE NANO U/F 32G X 4 MM MISC USE TO INJECT INSULIN 100 each 2  . calcium carbonate (OS-CAL) 600 MG tablet Take 1 tablet (600  mg total) by mouth 2 (two) times daily with a meal. 60 tablet 6  . gabapentin (NEURONTIN) 300 MG capsule Take 2 capsules (600 mg total) by mouth at bedtime. 60 capsule 6  . insulin aspart (NOVOLOG FLEXPEN) 100 UNIT/ML FlexPen 5 units with breakfast and dinner (Patient taking differently: Inject 5 Units into the skin 2 (two) times daily with a meal. 5 units with breakfast and dinner) 15 mL 11  . Insulin Glargine (LANTUS SOLOSTAR) 100 UNIT/ML Solostar Pen Inject 10 Units into the skin at bedtime. 5 pen PRN  . omeprazole (PRILOSEC) 20 MG capsule Take 1 capsule (20 mg total) by mouth daily. 30 capsule 3  . Vitamin D, Cholecalciferol, 10 MCG (400 UNIT) CAPS Take 800 Units by mouth daily. 120 capsule 6  . losartan (COZAAR) 25 MG tablet Take 1 tablet (25 mg total) by mouth daily. 30 tablet 6   No facility-administered medications prior to visit.       Review of Systems  Constitutional: Negative for fever.  HENT: Positive for postnasal drip and rhinorrhea. Negative for ear pain and sore throat.   Respiratory: Positive for cough and shortness of breath. Negative for hemoptysis and wheezing.   Cardiovascular: Positive for chest pain.  Gastrointestinal: Negative for heartburn.  Neurological: Positive for headaches.       Objective:   Physical Exam  Vitals:   05/02/19 1421  BP: (!) 192/79  Pulse: 77  Resp: 16  Temp: 98 F (36.7 C)  TempSrc: Oral  SpO2: 99%  Weight: 92 lb 12.8 oz (42.1 kg)    Gen: Pleasant, thin, in no distress,  normal affect  ENT: Very poor dental hygiene with periodontal disease most of the teeth are gone however there are several left in both upper and lower jaw with significant periodontal disease oropharynx clear,2 +postnasal drip Significant nasal turbinate edema without purulence  Neck: No JVD, no TMG, no carotid bruits  Lungs: No use of accessory muscles, no dullness to percussion, clear without rales or rhonchi  Cardiovascular: RRR, heart sounds normal,  no murmur or gallops, no peripheral edema  Abdomen: soft and NT, no HSM,  BS normal  Musculoskeletal: No deformities, no cyanosis or clubbing  Neuro: alert, non focal  Skin: Warm, no lesions or rashes  CT scan of the chest reviewed from [facility] from October 22 IMPRESSION: 1. Small amount of airspace consolidation in the right lower lobe concerning for pneumonia. Additional ill-defined areas of ground-glass attenuation throughout the lower lobes of the lungs bilaterally, nonspecific, but potentially additional areas of infection/inflammation. 2. Trace right pleural effusion lying dependently. 3. Dilatation of the pulmonic trunk (  3.7 cm in diameter), concerning for pulmonary arterial hypertension. 4. Aortic atherosclerosis.      Assessment & Plan:  I personally reviewed all images and lab data in the [facility] system as well as any outside material available during this office visit and agree with the  radiology impressions.   Essential hypertension Hypertension with elevated systolic numbers  We will increase losartan 100 mg daily  Allergic rhinitis due to allergen Cyclic cough partially in the basis of postnasal drip syndrome with allergic rhinitis  We will begin Flonase 2 sprays each nostril daily  Community acquired pneumonia History of right lower lobe community-acquired pneumonia  Note in the right base question whether it was aspiration pneumonia.  No evidence of tuberculosis on chest CT is atypical for tuberculosis  Plan repeat chest x-ray  Periodontal disease The patient only has a few remaining teeth in both upper and lowers however has significant periodontal disease in these areas.    Will give the patient f/u with dental care hygiene  Chronic cough Chronic cough likely on the basis of upper airway postnasal drip syndrome and possibly residual lower airway inflammation  Will prescribe prednisone 30 mg daily x3 days then discontinue and use benzonatate  100 to 200 mg 3 times daily as needed for cough suppression  As stated above will also repeat chest x-ray and use Flonase in the nostrils   Olivia Williamson was seen today for cough.  Diagnoses and all orders for this visit:  Community acquired pneumonia of right lower lobe of lung -     DG Chest 2 View; Future  Chronic cough -     DG Chest 2 View; Future  Abnormal chest CT -     DG Chest 2 View; Future  History of tuberculosis  Non-seasonal allergic rhinitis due to other allergic trigger  Periodontal disease  Essential hypertension -     losartan (COZAAR) 100 MG tablet; Take 1 tablet (100 mg total) by mouth daily.  Other orders -     fluticasone (FLONASE) 50 MCG/ACT nasal spray; Place 2 sprays into both nostrils daily. -     predniSONE (DELTASONE) 10 MG tablet; Three daily for 3 days then stop -     benzonatate (TESSALON) 100 MG capsule; Take 2 capsules (200 mg total) by mouth 3 (three) times daily as needed for cough.   Return follow-up visit has been scheduled

## 2019-05-02 NOTE — Assessment & Plan Note (Signed)
History of right lower lobe community-acquired pneumonia  Note in the right base question whether it was aspiration pneumonia.  No evidence of tuberculosis on chest CT is atypical for tuberculosis  Plan repeat chest x-ray

## 2019-05-02 NOTE — Assessment & Plan Note (Signed)
Hypertension with elevated systolic numbers  We will increase losartan 100 mg daily

## 2019-05-02 NOTE — Assessment & Plan Note (Signed)
Chronic cough likely on the basis of upper airway postnasal drip syndrome and possibly residual lower airway inflammation  Will prescribe prednisone 30 mg daily x3 days then discontinue and use benzonatate 100 to 200 mg 3 times daily as needed for cough suppression  As stated above will also repeat chest x-ray and use Flonase in the nostrils

## 2019-05-02 NOTE — Assessment & Plan Note (Signed)
Cyclic cough partially in the basis of postnasal drip syndrome with allergic rhinitis  We will begin Flonase 2 sprays each nostril daily

## 2019-05-09 ENCOUNTER — Inpatient Hospital Stay: Payer: Medicare Other

## 2019-05-09 ENCOUNTER — Inpatient Hospital Stay: Payer: Medicare Other | Attending: Hematology | Admitting: Hematology and Oncology

## 2019-05-27 ENCOUNTER — Other Ambulatory Visit: Payer: Self-pay | Admitting: Internal Medicine

## 2019-05-30 ENCOUNTER — Ambulatory Visit: Payer: Medicare Other | Attending: Critical Care Medicine | Admitting: Critical Care Medicine

## 2019-05-30 ENCOUNTER — Encounter: Payer: Self-pay | Admitting: Critical Care Medicine

## 2019-05-30 VITALS — BP 196/83 | HR 72 | Resp 16 | Wt 93.8 lb

## 2019-05-30 DIAGNOSIS — R0982 Postnasal drip: Secondary | ICD-10-CM | POA: Diagnosis not present

## 2019-05-30 DIAGNOSIS — J90 Pleural effusion, not elsewhere classified: Secondary | ICD-10-CM | POA: Diagnosis not present

## 2019-05-30 DIAGNOSIS — Z794 Long term (current) use of insulin: Secondary | ICD-10-CM | POA: Diagnosis not present

## 2019-05-30 DIAGNOSIS — I700 Atherosclerosis of aorta: Secondary | ICD-10-CM | POA: Diagnosis not present

## 2019-05-30 DIAGNOSIS — R05 Cough: Secondary | ICD-10-CM | POA: Diagnosis present

## 2019-05-30 DIAGNOSIS — D649 Anemia, unspecified: Secondary | ICD-10-CM | POA: Insufficient documentation

## 2019-05-30 DIAGNOSIS — M546 Pain in thoracic spine: Secondary | ICD-10-CM | POA: Diagnosis not present

## 2019-05-30 DIAGNOSIS — E118 Type 2 diabetes mellitus with unspecified complications: Secondary | ICD-10-CM

## 2019-05-30 DIAGNOSIS — K056 Periodontal disease, unspecified: Secondary | ICD-10-CM | POA: Diagnosis not present

## 2019-05-30 DIAGNOSIS — M8008XA Age-related osteoporosis with current pathological fracture, vertebra(e), initial encounter for fracture: Secondary | ICD-10-CM | POA: Diagnosis not present

## 2019-05-30 DIAGNOSIS — E1142 Type 2 diabetes mellitus with diabetic polyneuropathy: Secondary | ICD-10-CM

## 2019-05-30 DIAGNOSIS — M5416 Radiculopathy, lumbar region: Secondary | ICD-10-CM | POA: Diagnosis not present

## 2019-05-30 DIAGNOSIS — R053 Chronic cough: Secondary | ICD-10-CM

## 2019-05-30 DIAGNOSIS — M1990 Unspecified osteoarthritis, unspecified site: Secondary | ICD-10-CM | POA: Diagnosis not present

## 2019-05-30 DIAGNOSIS — D582 Other hemoglobinopathies: Secondary | ICD-10-CM

## 2019-05-30 DIAGNOSIS — M8008XD Age-related osteoporosis with current pathological fracture, vertebra(e), subsequent encounter for fracture with routine healing: Secondary | ICD-10-CM

## 2019-05-30 DIAGNOSIS — I10 Essential (primary) hypertension: Secondary | ICD-10-CM | POA: Diagnosis not present

## 2019-05-30 DIAGNOSIS — J3089 Other allergic rhinitis: Secondary | ICD-10-CM | POA: Diagnosis not present

## 2019-05-30 DIAGNOSIS — G8929 Other chronic pain: Secondary | ICD-10-CM

## 2019-05-30 DIAGNOSIS — IMO0002 Reserved for concepts with insufficient information to code with codable children: Secondary | ICD-10-CM

## 2019-05-30 DIAGNOSIS — E1165 Type 2 diabetes mellitus with hyperglycemia: Secondary | ICD-10-CM

## 2019-05-30 DIAGNOSIS — Z8611 Personal history of tuberculosis: Secondary | ICD-10-CM | POA: Diagnosis not present

## 2019-05-30 DIAGNOSIS — Z79899 Other long term (current) drug therapy: Secondary | ICD-10-CM | POA: Diagnosis not present

## 2019-05-30 DIAGNOSIS — K219 Gastro-esophageal reflux disease without esophagitis: Secondary | ICD-10-CM | POA: Insufficient documentation

## 2019-05-30 DIAGNOSIS — J189 Pneumonia, unspecified organism: Secondary | ICD-10-CM

## 2019-05-30 DIAGNOSIS — R9389 Abnormal findings on diagnostic imaging of other specified body structures: Secondary | ICD-10-CM

## 2019-05-30 MED ORDER — AMLODIPINE BESYLATE 5 MG PO TABS: 5.0000 mg | ORAL_TABLET | Freq: Every day | ORAL | 6 refills | Status: DC

## 2019-05-30 MED ORDER — LANTUS SOLOSTAR 100 UNIT/ML ~~LOC~~ SOPN: 15.0000 [IU] | PEN_INJECTOR | Freq: Every day | SUBCUTANEOUS | 99 refills | Status: DC

## 2019-05-30 MED ORDER — NOVOLOG FLEXPEN 100 UNIT/ML ~~LOC~~ SOPN: PEN_INJECTOR | SUBCUTANEOUS | 11 refills | Status: DC

## 2019-05-30 NOTE — Assessment & Plan Note (Signed)
Community-acquired pneumonia clean now resolved we will get a follow-up chest x-ray at this visit

## 2019-05-30 NOTE — Assessment & Plan Note (Signed)
Plan with regards to diabetes we will increase Lantus to 15 units daily and increase NovoLog to 8 units twice daily with breakfast and dinner and follow-up with primary care  We will also obtain a complete blood count and comprehensive metabolic panel at this visit and lipid panel

## 2019-05-30 NOTE — Assessment & Plan Note (Signed)
Chronic low back pain and thoracic pain has been a chronic issue

## 2019-05-30 NOTE — Assessment & Plan Note (Signed)
Lumbar radiculopathy with prior history of vertebra collapse due to osteoporosis  Will refer back to orthopedics and obtain lumbar and spine x-rays  Patient continue calcium and vitamin D supplementation

## 2019-05-30 NOTE — Assessment & Plan Note (Signed)
History of hematoma in ED and chronic anemia we will repeat CBC at this visit

## 2019-05-30 NOTE — Assessment & Plan Note (Signed)
Cyclic cough on the basis of allergic rhinitis now improved with Flonase patient asked to continue the Flonase daily

## 2019-05-30 NOTE — Assessment & Plan Note (Signed)
History of hypertension with poor control currently at this visit but it appears the patient never had the amlodipine 5 mg daily filled and is only on the losartan 100 mg only.  Plan will be to resume amlodipine 5 mg daily and continue losartan 100 mg daily

## 2019-05-30 NOTE — Assessment & Plan Note (Signed)
No evidence of active tuberculosis based on recent CT of chest

## 2019-05-30 NOTE — Assessment & Plan Note (Signed)
Cyclic cough appears to have resolved at this time likely due to upper airway irritability

## 2019-05-30 NOTE — Patient Instructions (Signed)
Increase Lantus to 15 units at bedtime  Increase NovoLog to 8 units with 2 meals each day  Began amlodipine 5 mg daily and continue losartan daily, the amlodipine prescription was sent to the pharmacy  Stay on Flonase daily for your cough and nasal congestion  Return to see Dr. Laural Benes in 6 weeks  X-rays of the spine and chest will be made and a referral to orthopedics were made for the spine condition  Labs today to check liver kidney body chemistry function blood counts and cholesterol

## 2019-05-30 NOTE — Progress Notes (Signed)
Subjective:    Patient ID: Olivia Williamson, female    DOB: [birth year], 74 y.o.   MRN: [id number]  History of Present Illness: 74 y.o.F  [place].  Here for evaluation of cough.note the grandson in law served as an interpreter for this visit and has excellent English   this patient was admitted in October with right lower lobe pneumonia and known prior history of tuberculosis.  During that admission it was not discerned the patient had active tuberculosis. The patient was treated with intravenous antibiotics and oral antibiotics which she has now finished.  The patient still has a chronic cough that is mostly dry at this time.  There is postnasal drainage and anterior nasal drainage.  She has some nasal congestion as well.  The patient is referred by primary care for evaluation of the chronic cough The patient is a non-smoker does have hypertension today comes in 192/79  Discharge summary from recent admission was reviewed note the patient has hemoglobin E and has chronic anemia from this  05/30/2019 we do have a [place] interpreter with [place] today for this visit in person [name] The daughter is also with the patient who speaks minimal English This is a return visit for evaluation of chronic cyclic cough.  Patient also has associated hypertension, previous history of community-acquired pneumonia, periodontal disease, and poor dentition  At the last visit it was felt the periodontal disease was likely the source of the patient's recent community-acquired pneumonia.  She has not yet achieved the outpatient chest x-ray that we had ordered previously.  Note the patient does complain of low back pain and midthoracic pain as well.  She is had previous history of compression fractures of the thoracic spine but is yet to see orthopedics.  Today she complains of the mid and low back pain.  She does state though that her cough has dramatically improved.  She is having less postnasal drainage and less nasal  congestion on the Flonase.  She did have the chest x-ray ordered but is yet to obtain the chest x-ray we had requested  The patient is maintaining losartan at 100 mg daily which we had increased at the previous visit  Note on arrival today her blood pressure is 196/83.  She also states her blood sugar is a very fluctuant at home.  Blood sugar today is 273 and she has had numbers as high as 435 at home    Cough This is a chronic problem. The current episode started 1 to 4 weeks ago. The problem has been gradually improving. Episode frequency: night time only. The cough is non-productive. Pertinent negatives include no chest pain, ear congestion, ear pain, fever, headaches, heartburn, hemoptysis, nasal congestion, postnasal drip, rhinorrhea, sore throat, shortness of breath or wheezing. Associated symptoms comments: Front and back. The symptoms are aggravated by cold air, exercise and lying down. She has tried nothing for the symptoms. Her past medical history is significant for pneumonia.   Past Medical History:  Diagnosis Date  . Arthritis   . Diabetes mellitus without complication ([place])    stopped DM meds 2004  . GERD (gastroesophageal reflux disease)   . Hypertension    meds stopped 1 week ago , need to get renewed  . Tuberculosis    ACTIVE     Family History  Problem Relation Age of Onset  . Heart disease Maternal Uncle 70     Social History   Socioeconomic History  . Marital status: Widowed  Spouse name: Not on file  . Number of children: Not on file  . Years of education: Not on file  . Highest education level: Not on file  Occupational History  . Not on file  Tobacco Use  . Smoking status: Never Smoker  . Smokeless tobacco: Never Used  Substance and Sexual Activity  . Alcohol use: No  . Drug use: No  . Sexual activity: Not on file  Other Topics Concern  . Not on file  Social History Narrative  . Not on file   Social Determinants of Health   Financial  Resource Strain:   . Difficulty of Paying Living Expenses: Not on file  Food Insecurity:   . Worried About Programme researcher, broadcasting/film/video in the Last Year: Not on file  . Ran Out of Food in the Last Year: Not on file  Transportation Needs:   . Lack of Transportation (Medical): Not on file  . Lack of Transportation (Non-Medical): Not on file  Physical Activity:   . Days of Exercise per Week: Not on file  . Minutes of Exercise per Session: Not on file  Stress:   . Feeling of Stress : Not on file  Social Connections:   . Frequency of Communication with Friends and Family: Not on file  . Frequency of Social Gatherings with Friends and Family: Not on file  . Attends Religious Services: Not on file  . Active Member of Clubs or Organizations: Not on file  . Attends Banker Meetings: Not on file  . Marital Status: Not on file  Intimate Partner Violence:   . Fear of Current or Ex-Partner: Not on file  . Emotionally Abused: Not on file  . Physically Abused: Not on file  . Sexually Abused: Not on file     Allergies  Allergen Reactions  . Shrimp [Shellfish Allergy]     Sneezing and SOB  . Beef-Derived Products Other (See Comments)    Generalized burning sensation  . Fruit & Vegetable Daily [Nutritional Supplements] Other (See Comments)    Orange causes lower extremity burning     Outpatient Medications Prior to Visit  Medication Sig Dispense Refill  . BD PEN NEEDLE NANO U/F 32G X 4 MM MISC USE TO INJECT INSULIN 100 each 2  . calcium carbonate (OS-CAL) 600 MG tablet Take 1 tablet (600 mg total) by mouth 2 (two) times daily with a meal. 60 tablet 6  . fluticasone (FLONASE) 50 MCG/ACT nasal spray Place 2 sprays into both nostrils daily. 16 g 6  . gabapentin (NEURONTIN) 300 MG capsule Take 2 capsules (600 mg total) by mouth at bedtime. 60 capsule 6  . losartan (COZAAR) 100 MG tablet Take 1 tablet (100 mg total) by mouth daily. 30 tablet 3  . Vitamin D, Cholecalciferol, 10 MCG (400 UNIT)  CAPS Take 800 Units by mouth daily. 120 capsule 6  . insulin aspart (NOVOLOG FLEXPEN) 100 UNIT/ML FlexPen 5 units with breakfast and dinner (Patient taking differently: Inject 5 Units into the skin 2 (two) times daily with a meal. 5 units with breakfast and dinner) 15 mL 11  . Insulin Glargine (LANTUS SOLOSTAR) 100 UNIT/ML Solostar Pen Inject 10 Units into the skin at bedtime. 5 pen PRN  . amLODipine (NORVASC) 5 MG tablet Take 1 tablet (5 mg total) by mouth daily. (Patient not taking: Reported on 05/30/2019) 30 tablet 6  . benzonatate (TESSALON) 100 MG capsule Take 2 capsules (200 mg total) by mouth 3 (three) times daily as  needed for cough. (Patient not taking: Reported on 05/30/2019) 30 capsule 0  . omeprazole (PRILOSEC) 20 MG capsule Take 1 capsule (20 mg total) by mouth daily. (Patient not taking: Reported on 05/30/2019) 30 capsule 3  . predniSONE (DELTASONE) 10 MG tablet Three daily for 3 days then stop (Patient not taking: Reported on 05/30/2019) 9 tablet 0   No facility-administered medications prior to visit.      Review of Systems  Constitutional: Negative for fever.  HENT: Negative for ear pain, postnasal drip, rhinorrhea and sore throat.   Respiratory: Positive for cough. Negative for hemoptysis, shortness of breath and wheezing.   Cardiovascular: Negative for chest pain.  Gastrointestinal: Negative for heartburn.  Neurological: Negative for headaches.  Low back pain     Objective:   Physical Exam  Vitals:   05/31/19 0926  BP: (!) 196/83  Pulse: 72  Resp: 16  SpO2: 98%  Weight: 93 lb 12.8 oz (42.5 kg)    Gen: Pleasant, thin, in no distress,  normal affect  ENT: Very poor dental hygiene with periodontal disease most of the teeth are gone however there are several left in both upper and lower jaw with significant periodontal disease oropharynx clear, improved post nasal drip Improved  nasal turbinate edema without purulence  Neck: No JVD, no TMG, no carotid bruits  Lungs:  No use of accessory muscles, no dullness to percussion, clear without rales or rhonchi  Cardiovascular: RRR, heart sounds normal, no murmur or gallops, no peripheral edema  Abdomen: soft and NT, no HSM,  BS normal  Musculoskeletal: No deformities, no cyanosis or clubbing, lower back pain noted to palpation  Neuro: alert, non focal  Skin: Warm, no lesions or rashes  CT scan of the chest reviewed from [place] link from October 22 IMPRESSION: 1. Small amount of airspace consolidation in the right lower lobe concerning for pneumonia. Additional ill-defined areas of ground-glass attenuation throughout the lower lobes of the lungs bilaterally, nonspecific, but potentially additional areas of infection/inflammation. 2. Trace right pleural effusion lying dependently. 3. Dilatation of the pulmonic trunk (3.7 cm in diameter), concerning for pulmonary arterial hypertension. 4. Aortic atherosclerosis.      Assessment & Plan:  I personally reviewed all images and lab data in the [facility] system as well as any outside material available during this office visit and agree with the  radiology impressions.   Essential hypertension History of hypertension with poor control currently at this visit but it appears the patient never had the amlodipine 5 mg daily filled and is only on the losartan 100 mg only.  Plan will be to resume amlodipine 5 mg daily and continue losartan 100 mg daily  Allergic rhinitis due to allergen Cyclic cough on the basis of allergic rhinitis now improved with Flonase patient asked to continue the Flonase daily  Community acquired pneumonia Community-acquired pneumonia clean now resolved we will get a follow-up chest x-ray at this visit  Periodontal disease The daughter knows the patient will need a follow-up dental exam we gave her resources for dental  Lumbar radiculopathy Lumbar radiculopathy with prior history of vertebra collapse due to osteoporosis  Will refer  back to orthopedics and obtain lumbar and spine x-rays  Patient continue calcium and vitamin D supplementation  Chronic cough Cyclic cough appears to have resolved at this time likely due to upper airway irritability  Chronic left-sided thoracic back pain Chronic low back pain and thoracic pain has been a chronic issue  Hemoglobin E (hb-e) (HCC)  History of hematoma in ED and chronic anemia we will repeat CBC at this visit  History of tuberculosis No evidence of active tuberculosis based on recent CT of chest  Diabetes mellitus with neurological manifestations (HCC) Plan with regards to diabetes we will increase Lantus to 15 units daily and increase NovoLog to 8 units twice daily with breakfast and dinner and follow-up with primary care  We will also obtain a complete blood count and comprehensive metabolic panel at this visit and lipid panel   Cadee was seen today for follow-up.  Diagnoses and all orders for this visit:  Chronic cough  Essential hypertension -     amLODipine (NORVASC) 5 MG tablet; Take 1 tablet (5 mg total) by mouth daily. -     CBC with Differential/Platelet; Future -     CBC with Differential/Platelet  Diabetes mellitus type 2, uncontrolled, with complications (HCC) -     Comprehensive metabolic panel -     Lipid panel -     CBC with Differential/Platelet; Future -     insulin aspart (NOVOLOG FLEXPEN) 100 UNIT/ML FlexPen; 8 units with breakfast and dinner -     Insulin Glargine (LANTUS SOLOSTAR) 100 UNIT/ML Solostar Pen; Inject 15 Units into the skin at bedtime. -     CBC with Differential/Platelet  Lumbar radiculopathy -     DG Thoracic Spine 2 View; Future -     DG Lumbar Spine Complete; Future -     AMB referral to orthopedics  Community acquired pneumonia of right lower lobe of lung  Periodontal disease  Type 2 diabetes mellitus with diabetic polyneuropathy, with long-term current use of insulin (HCC)  Abnormal chest CT  Hemoglobin E (hb-e)  (HCC)  Non-seasonal allergic rhinitis due to other allergic trigger  Chronic left-sided thoracic back pain  History of tuberculosis  Pathological fracture of vertebra due to osteoporosis with routine healing, unspecified osteoporosis type, subsequent encounter  Note 15 minutes of this visit was spent doing a complete medication reconciliation with both the daughter patient and the interpreter as the patient was very confused as to which medication she was taking

## 2019-05-30 NOTE — Assessment & Plan Note (Signed)
The daughter knows the patient will need a follow-up dental exam we gave her resources for dental

## 2019-05-31 LAB — COMPREHENSIVE METABOLIC PANEL
ALT: 33 IU/L — ABNORMAL HIGH (ref 0–32)
AST: 28 IU/L (ref 0–40)
Albumin/Globulin Ratio: 1.6 (ref 1.2–2.2)
Albumin: 4 g/dL (ref 3.7–4.7)
Alkaline Phosphatase: 63 IU/L (ref 39–117)
BUN/Creatinine Ratio: 21 (ref 12–28)
BUN: 17 mg/dL (ref 8–27)
Bilirubin Total: 0.7 mg/dL (ref 0.0–1.2)
CO2: 29 mmol/L (ref 20–29)
Calcium: 9.9 mg/dL (ref 8.7–10.3)
Chloride: 95 mmol/L — ABNORMAL LOW (ref 96–106)
Creatinine, Ser: 0.82 mg/dL (ref 0.57–1.00)
GFR calc Af Amer: 83 mL/min/{1.73_m2} (ref >59)
GFR calc non Af Amer: 72 mL/min/{1.73_m2} (ref >59)
Globulin, Total: 2.5 g/dL (ref 1.5–4.5)
Glucose: 237 mg/dL — ABNORMAL HIGH (ref 65–99)
Potassium: 4.4 mmol/L (ref 3.5–5.2)
Sodium: 135 mmol/L (ref 134–144)
Total Protein: 6.5 g/dL (ref 6.0–8.5)

## 2019-05-31 LAB — CBC WITH DIFFERENTIAL/PLATELET
Basophils Absolute: 0 10*3/uL (ref 0.0–0.2)
Basos: 1 %
EOS (ABSOLUTE): 0.1 10*3/uL (ref 0.0–0.4)
Eos: 4 %
Hematocrit: 29.8 % — ABNORMAL LOW (ref 34.0–46.6)
Hemoglobin: 9.3 g/dL — ABNORMAL LOW (ref 11.1–15.9)
Immature Grans (Abs): 0 10*3/uL (ref 0.0–0.1)
Immature Granulocytes: 0 %
Lymphocytes Absolute: 0.9 10*3/uL (ref 0.7–3.1)
Lymphs: 28 %
MCH: 22 pg — ABNORMAL LOW (ref 26.6–33.0)
MCHC: 31.2 g/dL — ABNORMAL LOW (ref 31.5–35.7)
MCV: 70 fL — ABNORMAL LOW (ref 79–97)
Monocytes Absolute: 0.2 10*3/uL (ref 0.1–0.9)
Monocytes: 6 %
Neutrophils Absolute: 1.9 10*3/uL (ref 1.4–7.0)
Neutrophils: 61 %
Platelets: 74 10*3/uL — CL (ref 150–450)
RBC: 4.23 x10E6/uL (ref 3.77–5.28)
RDW: 16.3 % — ABNORMAL HIGH (ref 11.7–15.4)
WBC: 3.1 10*3/uL — ABNORMAL LOW (ref 3.4–10.8)

## 2019-05-31 LAB — LIPID PANEL
Chol/HDL Ratio: 3.1 ratio (ref 0.0–4.4)
Cholesterol, Total: 129 mg/dL (ref 100–199)
HDL: 42 mg/dL (ref >39)
LDL Chol Calc (NIH): 56 mg/dL (ref 0–99)
Triglycerides: 189 mg/dL — ABNORMAL HIGH (ref 0–149)
VLDL Cholesterol Cal: 31 mg/dL (ref 5–40)

## 2019-06-05 ENCOUNTER — Telehealth: Payer: Self-pay | Admitting: *Deleted

## 2019-06-05 NOTE — Telephone Encounter (Signed)
MA has been unsuccessful in reaching the patient at any listed contacts including emergency contacts. A letter of communication will now be mailed.

## 2019-06-05 NOTE — Telephone Encounter (Signed)
-----   Message from Storm Frisk, MD sent at 05/31/2019 12:25 PM EST ----- Let the patient know her blood counts are stable,  body chemistry except for blood sugar normal, liver and kidney normal  Cholesterol is at goal  no need for medication changes

## 2019-06-07 ENCOUNTER — Ambulatory Visit (INDEPENDENT_AMBULATORY_CARE_PROVIDER_SITE_OTHER): Payer: Medicare Other | Admitting: Family Medicine

## 2019-06-07 ENCOUNTER — Other Ambulatory Visit: Payer: Self-pay

## 2019-06-07 ENCOUNTER — Encounter: Payer: Self-pay | Admitting: Family Medicine

## 2019-06-07 ENCOUNTER — Ambulatory Visit: Payer: Self-pay

## 2019-06-07 DIAGNOSIS — M545 Low back pain, unspecified: Secondary | ICD-10-CM

## 2019-06-07 DIAGNOSIS — M48062 Spinal stenosis, lumbar region with neurogenic claudication: Secondary | ICD-10-CM | POA: Diagnosis not present

## 2019-06-07 NOTE — Progress Notes (Signed)
Office Visit Note   Patient: Olivia Williamson           Date of Birth: 10/07/1945           MRN: [id number] Visit Date: 06/07/2019 Requested by: Storm Frisk, MD [address] [place],  [place] [address] PCP: Marcine Matar, MD  Subjective: Chief Complaint  Patient presents with  . Lower Back - Pain    Pain in lower back with pain down both legs.     HPI: She is here with low back pain.  Her grandson is with her, and an interpreter was also present.  She has had low back pain for several years.  She recalls falling at 1 point and thinks that is the start of her pain.  Now she has pain constantly and it radiates down both of her legs.  Denies any bowel or bladder dysfunction, fevers or chills.  She has not done anything for her pain other than trying some medications which did not make much difference for her.  She was seen in 2018 in the [facility] system and had x-rays and MRI scan done.  She had T12 compression fracture with mild retropulsion.  She had moderate to severe spinal stenosis at L3-4 and L4-5 levels due to disc protrusion.               ROS:   All other systems were reviewed and are negative.  Objective: Vital Signs: LMP 05/11/2016   Physical Exam:  General:  Alert and oriented, in no acute distress. Pulm:  Breathing unlabored. Psy:  Normal mood, congruent affect.  Low back: She has tenderness in the lower lumbar spine in the midline and in the paraspinous muscles.  Pain in both sciatic notch areas.  Lower extremity strength is 5/5 bilaterally and she has hypoactive knee and ankle DTRs.  Imaging: X-rays lumbar spine: Chronic T12 compression fracture.  No new compression deformities seen.  She has osteopenic appearance of her bones and some mild degenerative disc disease.  No sign of neoplasm.    Assessment & Plan: 1.  Chronic low back pain most likely due to lumbar spinal stenosis seen on previous MRI scan -Discussed options with her and she does not think she can  do physical therapy due to transportation problems.  We will try an epidural injection.     Procedures: No procedures performed  No notes on file     PMFS History: Patient Active Problem List   Diagnosis Date Noted  . Allergic rhinitis due to allergen 05/02/2019  . Periodontal disease 05/02/2019  . Chronic cough 04/13/2019  . Non-intractable vomiting   . Community acquired pneumonia 03/16/2019  . Positive for macroalbuminuria 02/05/2019  . 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 Lumbar radiculopathy 02/02/2017  . Chronic left-sided thoracic back pain 02/02/2017  . 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 Multinodular goiter 11/01/2016  . Thyroid nodule 04/13/2016  . History of tuberculosis 01/09/2016  . Protein-calorie malnutrition, severe 12/16/2015  . Generalized weakness 12/15/2015  . Essential hypertension   . GERD (gastroesophageal reflux disease)   . Diabetes mellitus with neurological manifestations [facility])    Past Medical History:  Diagnosis Date  . Arthritis   . Diabetes mellitus without complication (HCC)    stopped DM meds 2004  . GERD (gastroesophageal reflux disease)   . Hypertension    meds stopped 1 week ago , need to get renewed  . Tuberculosis  ACTIVE    Family History  Problem Relation Age of Onset  . Heart disease Maternal Uncle 50    Past Surgical History:  Procedure Laterality Date  . ABDOMINAL HYSTERECTOMY    . CHOLECYSTECTOMY N/A 05/02/2014   Procedure: LAPAROSCOPIC CHOLECYSTECTOMY ;  Surgeon: Coralie Keens, MD;  Location: [facility];  Service: General;  Laterality: N/A;  laparoscopic cholecystectomy  . COLONOSCOPY    . NO PAST SURGERIES    . VIDEO BRONCHOSCOPY Bilateral 01/10/2016   Procedure: VIDEO BRONCHOSCOPY WITHOUT FLUORO;  Surgeon: Collene Gobble, MD;  Location: [facility];  Service: Cardiopulmonary;  Laterality: Bilateral;   Social History   Occupational History  . Not on file    Tobacco Use  . Smoking status: Never Smoker  . Smokeless tobacco: Never Used  Substance and Sexual Activity  . Alcohol use: No  . Drug use: No  . Sexual activity: Not on file

## 2019-06-13 ENCOUNTER — Ambulatory Visit: Payer: Medicare Other | Admitting: Internal Medicine

## 2019-06-15 ENCOUNTER — Ambulatory Visit: Payer: Medicare Other | Admitting: Internal Medicine

## 2019-06-25 ENCOUNTER — Other Ambulatory Visit: Payer: Self-pay | Admitting: Internal Medicine

## 2019-06-25 DIAGNOSIS — E1142 Type 2 diabetes mellitus with diabetic polyneuropathy: Secondary | ICD-10-CM

## 2019-06-26 ENCOUNTER — Other Ambulatory Visit: Payer: Self-pay | Admitting: Internal Medicine

## 2019-07-10 ENCOUNTER — Encounter: Payer: Self-pay | Admitting: Internal Medicine

## 2019-07-10 ENCOUNTER — Other Ambulatory Visit: Payer: Self-pay

## 2019-07-10 ENCOUNTER — Ambulatory Visit: Payer: Medicare Other | Attending: Internal Medicine | Admitting: Internal Medicine

## 2019-07-10 ENCOUNTER — Telehealth: Payer: Self-pay | Admitting: Hematology

## 2019-07-10 VITALS — BP 210/94 | HR 71 | Wt 97.2 lb

## 2019-07-10 DIAGNOSIS — E042 Nontoxic multinodular goiter: Secondary | ICD-10-CM | POA: Diagnosis not present

## 2019-07-10 DIAGNOSIS — K219 Gastro-esophageal reflux disease without esophagitis: Secondary | ICD-10-CM | POA: Insufficient documentation

## 2019-07-10 DIAGNOSIS — L299 Pruritus, unspecified: Secondary | ICD-10-CM | POA: Diagnosis not present

## 2019-07-10 DIAGNOSIS — E1142 Type 2 diabetes mellitus with diabetic polyneuropathy: Secondary | ICD-10-CM

## 2019-07-10 DIAGNOSIS — Z794 Long term (current) use of insulin: Secondary | ICD-10-CM

## 2019-07-10 DIAGNOSIS — I10 Essential (primary) hypertension: Secondary | ICD-10-CM

## 2019-07-10 DIAGNOSIS — M5416 Radiculopathy, lumbar region: Secondary | ICD-10-CM

## 2019-07-10 DIAGNOSIS — IMO0002 Reserved for concepts with insufficient information to code with codable children: Secondary | ICD-10-CM

## 2019-07-10 DIAGNOSIS — D61818 Other pancytopenia: Secondary | ICD-10-CM | POA: Diagnosis not present

## 2019-07-10 DIAGNOSIS — E1165 Type 2 diabetes mellitus with hyperglycemia: Secondary | ICD-10-CM

## 2019-07-10 DIAGNOSIS — Z79899 Other long term (current) drug therapy: Secondary | ICD-10-CM | POA: Insufficient documentation

## 2019-07-10 DIAGNOSIS — E118 Type 2 diabetes mellitus with unspecified complications: Secondary | ICD-10-CM

## 2019-07-10 LAB — POCT GLYCOSYLATED HEMOGLOBIN (HGB A1C): HbA1c, POC (controlled diabetic range): 7.7 % — AB (ref 0.0–7.0)

## 2019-07-10 LAB — GLUCOSE, POCT (MANUAL RESULT ENTRY): POC Glucose: 215 mg/dl — AB (ref 70–99)

## 2019-07-10 MED ORDER — CALCIUM 600 MG PO TABS: 600.0000 mg | ORAL_TABLET | Freq: Two times a day (BID) | ORAL | 6 refills | Status: DC

## 2019-07-10 MED ORDER — NOVOLOG FLEXPEN 100 UNIT/ML ~~LOC~~ SOPN: PEN_INJECTOR | SUBCUTANEOUS | 11 refills | Status: DC

## 2019-07-10 MED ORDER — LANTUS SOLOSTAR 100 UNIT/ML ~~LOC~~ SOPN: 13.0000 [IU] | PEN_INJECTOR | Freq: Every day | SUBCUTANEOUS | 99 refills | Status: DC

## 2019-07-10 MED ORDER — AMLODIPINE BESYLATE 5 MG PO TABS: 5.0000 mg | ORAL_TABLET | Freq: Every day | ORAL | 6 refills | Status: DC

## 2019-07-10 NOTE — Progress Notes (Signed)
Blood Sugar this morning was 256

## 2019-07-10 NOTE — Telephone Encounter (Signed)
Received a new hem referral from Dr. Laural Benes for pancytopenia. Arna Medici from [facility] cld to schedule Olivia Williamson to see Dr. Candise Che on 3/2 at 1pm. She's aware to have the pt arrive 15 minutes early.

## 2019-07-10 NOTE — Patient Instructions (Signed)
Please give patient an appointment with the clinical pharmacist in 2 weeks for repeat blood pressure check.  Increase Lantus insulin to 13 units daily. Increase NovoLog insulin to 4 units with meals.

## 2019-07-10 NOTE — Progress Notes (Signed)
Patient ID: Olivia Williamson, female    DOB: [birth year]  MRN: [id number]  CC: Diabetes   Subjective: Olivia Williamson is a 74 y.o. female who presents for chronic ds management. Interpreter, Wier, from [place] is with her.  Grandson also with her.  He is unable to provide much history as he does not live with the patient. 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 Delford Field since last visit with me for evaluation of the cough and abnormal CT of the chest. She was assessed to have a postnasal drip component.  Chest x-ray ordered but patient has not had this done. Cough resolved.  Low PLT/WBC: I had referred her to hematology on last visit with me.  According to the referral note, she no showed the appointment.  Patient is not able to give much history of whether she was aware of the appointment.  She relies on family members for transportation  Lumbar radiculopathy:  Saw Dr. Oleta Mouse last month.  He had recommended physical therapy but patient did not think she can do it due to transportation issues.  Plan was to try epidural steroid injection.  She has some bottles of vitamin D and calcium supplements with her today.  Reports that she is taking.  HTN: Blood pressure elevated today in the office.  She has Cozaar with her but does not have amlodipine.  She is not sure whether she is taking or not.  She tells me that she is taking the medicines that she have with her today  Limits salt in foods No CP/SOB/LE edema  DIABETES TYPE 2 Last A1C:   Results for orders placed or performed in visit on 07/10/19  POCT glucose (manual entry)  Result Value Ref Range   POC Glucose 215 (A) 70 - 99 mg/dl  POCT glycosylated hemoglobin (Hb A1C)  Result Value Ref Range   Hemoglobin A1C     HbA1c POC (<> result, manual  entry)     HbA1c, POC (prediabetic range)     HbA1c, POC (controlled diabetic range) 7.7 (A) 0.0 - 7.0 %    Med Adherence:  [x]  Yes takes supposed to be on Lantus 15 units and NovoLog 8 units with breakfast and dinner according to Dr. note from last month.  However patient tells me she is taking Lantus  10 units, Novolog 3 units with meals    []  No Medication side effects:  []  Yes    [x]  No Home Monitoring?  [x]  Yes  BID Home glucose results range: this a.m was 250.  Has glucometer with her. Most readings in 200s Diet Adherence: [x]  Yes    []  No Exercise: []  Yes    [x]  No Hypoglycemic episodes?: []  Yes    [x]  No Numbness of the feet? []  Yes    []  No   Patient Active Problem List   Diagnosis Date Noted  . Allergic rhinitis due to allergen 05/02/2019  . Periodontal disease 05/02/2019  . Chronic cough 04/13/2019  . Non-intractable vomiting   . Community acquired pneumonia 03/16/2019  . Positive for macroalbuminuria 02/05/2019  . 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 Lumbar radiculopathy 02/02/2017  . Chronic left-sided thoracic back pain 02/02/2017  . 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 Multinodular goiter 11/01/2016  . Thyroid nodule 04/13/2016  .  History of tuberculosis 01/09/2016  . Protein-calorie malnutrition, severe 12/16/2015  . Generalized weakness 12/15/2015  . Pancytopenia (HCC) 12/15/2015  . Essential hypertension   . GERD (gastroesophageal reflux disease)   . Diabetes mellitus with neurological manifestations [facility])      Current Outpatient Medications on File Prior to Visit  Medication Sig Dispense Refill  . fluticasone (FLONASE) 50 MCG/ACT nasal spray Place 2 sprays into both nostrils daily. 16 g 6  . gabapentin (NEURONTIN) 300 MG capsule Take 2 capsules (600 mg total) by mouth at bedtime. 60 capsule 6  . Insulin Pen Needle (B-D UF III MINI PEN NEEDLES) 31G X 5 MM MISC USE TO INJECT INSULIN 100 each 2    . losartan (COZAAR) 100 MG tablet Take 1 tablet (100 mg total) by mouth daily. 30 tablet 3  . Vitamin D, Cholecalciferol, 10 MCG (400 UNIT) CAPS Take 800 Units by mouth daily. 120 capsule 6   No current facility-administered medications on file prior to visit.    Allergies  Allergen Reactions  . Shrimp [Shellfish Allergy]     Sneezing and SOB  . Beef-Derived Products Other (See Comments)    Generalized burning sensation  . Fruit & Vegetable Daily [Nutritional Supplements] Other (See Comments)    Orange causes lower extremity burning    Social History   Socioeconomic History  . Marital status: Widowed    Spouse name: Not on file  . Number of children: Not on file  . Years of education: Not on file  . Highest education level: Not on file  Occupational History  . Not on file  Tobacco Use  . Smoking status: Never Smoker  . Smokeless tobacco: Never Used  Substance and Sexual Activity  . Alcohol use: No  . Drug use: No  . Sexual activity: Not on file  Other Topics Concern  . Not on file  Social History Narrative  . Not on file   Social Determinants of Health   Financial Resource Strain:   . Difficulty of Paying Living Expenses: Not on file  Food Insecurity:   . Worried About Programme researcher, broadcasting/film/video in the Last Year: Not on file  . Ran Out of Food in the Last Year: Not on file  Transportation Needs:   . Lack of Transportation (Medical): Not on file  . Lack of Transportation (Non-Medical): Not on file  Physical Activity:   . Days of Exercise per Week: Not on file  . Minutes of Exercise per Session: Not on file  Stress:   . Feeling of Stress : Not on file  Social Connections:   . Frequency of Communication with Friends and Family: Not on file  . Frequency of Social Gatherings with Friends and Family: Not on file  . Attends Religious Services: Not on file  . Active Member of Clubs or Organizations: Not on file  . Attends Banker Meetings: Not on file  .  Marital Status: Not on file  Intimate Partner Violence:   . Fear of Current or Ex-Partner: Not on file  . Emotionally Abused: Not on file  . Physically Abused: Not on file  . Sexually Abused: Not on file    Family History  Problem Relation Age of Onset  . Heart disease Maternal Uncle 19    Past Surgical History:  Procedure Laterality Date  . ABDOMINAL HYSTERECTOMY    . CHOLECYSTECTOMY N/A 05/02/2014   Procedure: LAPAROSCOPIC CHOLECYSTECTOMY ;  Surgeon: Abigail Miyamoto, MD;  Location: [facility] OR;  Service:  General;  Laterality: N/A;  laparoscopic cholecystectomy  . COLONOSCOPY    . NO PAST SURGERIES    . VIDEO BRONCHOSCOPY Bilateral 01/10/2016   Procedure: VIDEO BRONCHOSCOPY WITHOUT FLUORO;  Surgeon: Leslye Peer, MD;  Location: [facility] ENDOSCOPY;  Service: Cardiopulmonary;  Laterality: Bilateral;    ROS: Review of Systems Negative except as stated above  PHYSICAL EXAM: BP (!) 210/94   Pulse 71   Wt 97 lb 3.2 oz (44.1 kg)   LMP 05/11/2016   SpO2 100%   BMI 19.63 kg/m   Physical Exam  General appearance - alert, well appearing, and in no distress Mental status -patient answers questions appropriately  neck - supple, no significant adenopathy Chest - clear to auscultation, no wheezes, rales or rhonchi, symmetric air entry Heart - normal rate, regular rhythm, normal S1, S2, no murmurs, rubs, clicks or gallops Extremities -no lower extremity edema  CMP Latest Ref Rng & Units 05/30/2019 04/13/2019 03/19/2019  Glucose 65 - 99 mg/dL 379(K) 240(X) 735(H)  BUN 8 - 27 mg/dL 17 25 18   Creatinine 0.57 - 1.00 mg/dL 2.99) 2.42(A  Sodium 134 - 144 mmol/L 135 136 133(L)  Potassium 3.5 - 5.2 mmol/L 4.4 5.2 4.3  Chloride 96 - 106 mmol/L 95(L) 96 99  CO2 20 - 29 mmol/L 29 25 27   Calcium 8.7 - 10.3 mg/dL 9.9 9.6 8.34)  Total Protein 6.0 - 8.5 g/dL 6.5 - -  Total Bilirubin 0.0 - 1.2 mg/dL 0.7 - -  Alkaline Phos 39 - 117 IU/L 63 - -  AST 0 - 40 IU/L 28 - -  ALT 0 - 32 IU/L 33(H) - -    Lipid Panel     Component Value Date/Time   CHOL 129 05/30/2019 1431   TRIG 189 (H) 05/30/2019 1431   HDL 42 05/30/2019 1431   CHOLHDL 3.1 05/30/2019 1431   CHOLHDL 2.7 03/18/2019 0358   VLDL 13 03/18/2019 0358   LDLCALC 56 05/30/2019 1431    CBC    Component Value Date/Time   WBC 3.1 (L) 05/30/2019 1432   WBC 4.7 03/19/2019 0352   RBC 4.23 05/30/2019 1432   RBC 3.66 (L) 03/19/2019 0352   HGB 9.3 (L) 05/30/2019 1432   HCT 29.8 (L) 05/30/2019 1432   PLT 74 (LL) 05/30/2019 1432   MCV 70 (L) 05/30/2019 1432   MCH 22.0 (L) 05/30/2019 1432   MCH 21.6 (L) 03/19/2019 0352   MCHC 31.2 (L) 05/30/2019 1432   MCHC 33.6 03/19/2019 0352   RDW 16.3 (H) 05/30/2019 1432   LYMPHSABS 0.9 05/30/2019 1432   MONOABS 0.2 12/06/2018 1408   EOSABS 0.1 05/30/2019 1432   BASOSABS 0.0 05/30/2019 1432    ASSESSMENT AND PLAN: 1. Type 2 diabetes mellitus with diabetic polyneuropathy, with long-term current use of insulin (HCC) -A1c has increased compared to 3 months ago.  Blood sugars are not at goal.  Recommend increase Lantus insulin to 13 units daily and NovoLog to 4 units with meals.  Encouraged her to bring readings with her on next visit - POCT glucose (manual entry) - POCT glycosylated hemoglobin (Hb A1C) - Insulin Glargine (LANTUS SOLOSTAR) 100 UNIT/ML Solostar Pen; Inject 13 Units into the skin at bedtime.  Dispense: 5 pen; Refill: PRN - insulin aspart (NOVOLOG FLEXPEN) 100 UNIT/ML FlexPen; 4 units with breakfast and dinner  Dispense: 15 mL; Refill: 11  2. Essential hypertension Not at goal.  She is out of amlodipine.  I have requested that the grandson stop at her  pharmacy on their way home to pick up this medication.  Continue Cozaar - amLODipine (NORVASC) 5 MG tablet; Take 1 tablet (5 mg total) by mouth daily.  Dispense: 30 tablet; Refill: 6  3. Itching Recommend trying Curel lotion which can be purchased over-the-counter  4. Pancytopenia ([place]) We will submit referral to the  hematologist again.  I will have our referral coordinator try to call and get the appointment today so that she can be given the appointment in hand to give to her children so that they know she will need transportation that day - Ambulatory referral to Hematology  5. Lumbar radiculopathy   Patient was given the opportunity to ask questions.  Patient verbalized understanding of the plan and was able to repeat key elements of the plan.   Orders Placed This Encounter  Procedures  . Ambulatory referral to Hematology  . POCT glucose (manual entry)  . POCT glycosylated hemoglobin (Hb A1C)     Requested Prescriptions   Signed Prescriptions Disp Refills  . calcium carbonate (OS-CAL) 600 MG tablet 60 tablet 6    Sig: Take 1 tablet (600 mg total) by mouth 2 (two) times daily with a meal.  . amLODipine (NORVASC) 5 MG tablet 30 tablet 6    Sig: Take 1 tablet (5 mg total) by mouth daily.  . Insulin Glargine (LANTUS SOLOSTAR) 100 UNIT/ML Solostar Pen 5 pen PRN    Sig: Inject 13 Units into the skin at bedtime.  . insulin aspart (NOVOLOG FLEXPEN) 100 UNIT/ML FlexPen 15 mL 11    Sig: 4 units with breakfast and dinner    Return in about 6 weeks (around 08/21/2019).  Karle Plumber, MD, FACP

## 2019-07-25 ENCOUNTER — Inpatient Hospital Stay: Payer: Medicare Other

## 2019-07-25 ENCOUNTER — Inpatient Hospital Stay: Payer: Medicare Other | Attending: Hematology | Admitting: Hematology

## 2019-07-31 ENCOUNTER — Telehealth: Payer: Self-pay | Admitting: Hematology

## 2019-07-31 NOTE — Telephone Encounter (Signed)
Olivia Williamson no showed for her appt on 3/2 w/Dr. Candise Che. I informed the referring office of the pt's missed appt. I provided the office number for [name] to reschedule her appt.

## 2019-08-29 ENCOUNTER — Ambulatory Visit: Payer: Medicare Other | Admitting: Internal Medicine

## 2019-08-29 ENCOUNTER — Telehealth: Payer: Self-pay

## 2019-08-29 NOTE — Telephone Encounter (Signed)
Got in touch with pt son to confirm appointment pt son states that pt will not be able to come in so I have reschedule pt an appointment with pcp August 31, 2019. Pt son is aware

## 2019-08-29 NOTE — Telephone Encounter (Signed)
Tried contacting pt contacts since yesterday to reschedule pt appointment. Been unsuccessful to reach any of the pts contacts. Tried this morning to reach pt contacts as well and haven't been able to get in touch with anyone today as well. If pt comes to appointment today please reschedule for an in person appointment

## 2019-08-31 ENCOUNTER — Other Ambulatory Visit: Payer: Self-pay

## 2019-08-31 ENCOUNTER — Encounter: Payer: Self-pay | Admitting: Internal Medicine

## 2019-08-31 ENCOUNTER — Ambulatory Visit: Payer: Medicare Other | Attending: Internal Medicine | Admitting: Internal Medicine

## 2019-08-31 VITALS — BP 130/60 | HR 73 | Temp 98.2°F | Resp 16 | Wt 96.6 lb

## 2019-08-31 DIAGNOSIS — E1165 Type 2 diabetes mellitus with hyperglycemia: Secondary | ICD-10-CM

## 2019-08-31 DIAGNOSIS — E1142 Type 2 diabetes mellitus with diabetic polyneuropathy: Secondary | ICD-10-CM | POA: Insufficient documentation

## 2019-08-31 DIAGNOSIS — Z79899 Other long term (current) drug therapy: Secondary | ICD-10-CM | POA: Diagnosis not present

## 2019-08-31 DIAGNOSIS — Z1211 Encounter for screening for malignant neoplasm of colon: Secondary | ICD-10-CM

## 2019-08-31 DIAGNOSIS — K219 Gastro-esophageal reflux disease without esophagitis: Secondary | ICD-10-CM | POA: Insufficient documentation

## 2019-08-31 DIAGNOSIS — Z794 Long term (current) use of insulin: Secondary | ICD-10-CM | POA: Diagnosis not present

## 2019-08-31 DIAGNOSIS — M810 Age-related osteoporosis without current pathological fracture: Secondary | ICD-10-CM | POA: Insufficient documentation

## 2019-08-31 DIAGNOSIS — I10 Essential (primary) hypertension: Secondary | ICD-10-CM | POA: Diagnosis not present

## 2019-08-31 DIAGNOSIS — D61818 Other pancytopenia: Secondary | ICD-10-CM | POA: Diagnosis not present

## 2019-08-31 DIAGNOSIS — M5416 Radiculopathy, lumbar region: Secondary | ICD-10-CM | POA: Diagnosis not present

## 2019-08-31 LAB — GLUCOSE, POCT (MANUAL RESULT ENTRY): POC Glucose: 383 mg/dl — AB (ref 70–99)

## 2019-08-31 MED ORDER — LANTUS SOLOSTAR 100 UNIT/ML ~~LOC~~ SOPN: 16.0000 [IU] | PEN_INJECTOR | Freq: Every day | SUBCUTANEOUS | 99 refills | Status: DC

## 2019-08-31 MED ORDER — NOVOLOG FLEXPEN 100 UNIT/ML ~~LOC~~ SOPN: PEN_INJECTOR | SUBCUTANEOUS | 11 refills | Status: DC

## 2019-08-31 NOTE — Patient Instructions (Signed)
Increase Lantus insulin to 16 units daily. Increase NovoLog insulin to 6 units with breakfast and dinner.  Please bring all of your medications with you on your next visit.  I have referred you to to specialist.  One is a hematologist to evaluate your low blood cell counts.  The other specialist is a gastroenterologist for you to have colon cancer screening done in the form of a colonoscopy.

## 2019-08-31 NOTE — Progress Notes (Signed)
Pt took her insulin at 6am and she ate around 7am

## 2019-08-31 NOTE — Progress Notes (Signed)
Patient ID: Olivia Williamson, female    DOB: 10-06-1945  MRN: [id number]  CC: Diabetes, Dizziness, and Hypertension   Subjective: Olivia Williamson is a 74 y.o. female who presents for chronic ds management Her concerns today include:  Interpreter, Olivia Williamson, from [employer] and son, Olivia Williamson, are with her.  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  Last seen 07/10/2019  Pt does not have medications with her today.  Pancytopenia:  Referred to hematology again on last visit when grandson was with her. We gave appt in hand prior to her leaving the office.  It was schedule for 07/25/2019 but she non-showed appt again per Dr. Clyda Greener office.  Son tells me that family members try to rotate taking her to appt.  He found out about appt late  HTN: Norvasc refilled on last visit.  Suppose to be on Cozaar also.  Pt not sure of names. Reportedly took all meds this a.m  DM: reports taking 13 units of one insulin (Lantus) and 4 units of Novolog twice a day - "morning and afternoon." BS this a.m was 587. Yesterday morning over 200 Forgot to bring log book with her.  "I do write it down but everything is at home."  Gives range 100-300s.   She reports that her eating habits are about the same.  She states she does not eat sweet foods or junk foods.  Patient Active Problem List   Diagnosis Date Noted  . Allergic rhinitis due to allergen 05/02/2019  . Periodontal disease 05/02/2019  . Chronic cough 04/13/2019  . Non-intractable vomiting   . Community acquired pneumonia 03/16/2019  . Positive for macroalbuminuria 02/05/2019  . 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 Lumbar radiculopathy 02/02/2017  . Chronic left-sided thoracic back pain 02/02/2017  . Hemoglobin E (hb-e) (HCC)  11/01/2016  . Multinodular goiter 11/01/2016  . Thyroid nodule 04/13/2016  . History of tuberculosis 01/09/2016  . Protein-calorie malnutrition, severe 12/16/2015  . Generalized weakness 12/15/2015  . Pancytopenia (HCC) 12/15/2015  . Essential hypertension   . GERD (gastroesophageal reflux disease)   . Diabetes mellitus with neurological manifestations [facility])      Current Outpatient Medications on File Prior to Visit  Medication Sig Dispense Refill  . amLODipine (NORVASC) 5 MG tablet Take 1 tablet (5 mg total) by mouth daily. 30 tablet 6  . calcium carbonate (OS-CAL) 600 MG tablet Take 1 tablet (600 mg total) by mouth 2 (two) times daily with a meal. 60 tablet 6  . fluticasone (FLONASE) 50 MCG/ACT nasal spray Place 2 sprays into both nostrils daily. 16 g 6  . gabapentin (NEURONTIN) 300 MG capsule Take 2 capsules (600 mg total) by mouth at bedtime. 60 capsule 6  . Insulin Pen Needle (B-D UF III MINI PEN NEEDLES) 31G X 5 MM MISC USE TO INJECT INSULIN 100 each 2  . losartan (COZAAR) 100 MG tablet Take 1 tablet (100 mg total) by mouth daily. 30 tablet 3  . Vitamin D, Cholecalciferol, 10 MCG (400 UNIT) CAPS Take 800 Units by mouth daily. 120 capsule 6   No current facility-administered medications on file prior to visit.    Allergies  Allergen Reactions  . Shrimp [Shellfish Allergy]     Sneezing and SOB  . Beef-Derived Products Other (See Comments)    Generalized burning sensation  .  Fruit & Vegetable Daily [Nutritional Supplements] Other (See Comments)    Orange causes lower extremity burning    Social History   Socioeconomic History  . Marital status: Widowed    Spouse name: Not on file  . Number of children: Not on file  . Years of education: Not on file  . Highest education level: Not on file  Occupational History  . Not on file  Tobacco Use  . Smoking status: Never Smoker  . Smokeless tobacco: Never Used  Substance and Sexual Activity  . Alcohol use: No  . Drug use:  No  . Sexual activity: Not on file  Other Topics Concern  . Not on file  Social History Narrative  . Not on file   Social Determinants of Health   Financial Resource Strain:   . Difficulty of Paying Living Expenses:   Food Insecurity:   . Worried About Charity fundraiser in the Last Year:   . Arboriculturist in the Last Year:   Transportation Needs:   . Film/video editor (Medical):   Marland Kitchen Lack of Transportation (Non-Medical):   Physical Activity:   . Days of Exercise per Week:   . Minutes of Exercise per Session:   Stress:   . Feeling of Stress :   Social Connections:   . Frequency of Communication with Friends and Family:   . Frequency of Social Gatherings with Friends and Family:   . Attends Religious Services:   . Active Member of Clubs or Organizations:   . Attends Archivist Meetings:   Marland Kitchen Marital Status:   Intimate Partner Violence:   . Fear of Current or Ex-Partner:   . Emotionally Abused:   Marland Kitchen Physically Abused:   . Sexually Abused:     Family History  Problem Relation Age of Onset  . Heart disease Maternal Uncle 54    Past Surgical History:  Procedure Laterality Date  . ABDOMINAL HYSTERECTOMY    . CHOLECYSTECTOMY N/A 05/02/2014   Procedure: LAPAROSCOPIC CHOLECYSTECTOMY ;  Surgeon: Coralie Keens, MD;  Location: [place];  Service: General;  Laterality: N/A;  laparoscopic cholecystectomy  . COLONOSCOPY    . NO PAST SURGERIES    . VIDEO BRONCHOSCOPY Bilateral 01/10/2016   Procedure: VIDEO BRONCHOSCOPY WITHOUT FLUORO;  Surgeon: Collene Gobble, MD;  Location: [place];  Service: Cardiopulmonary;  Laterality: Bilateral;    ROS: Review of Systems Negative except as stated above  PHYSICAL EXAM: BP 130/60   Pulse 73   Temp 98.2 F (36.8 C)   Resp 16   Wt 96 lb 9.6 oz (43.8 kg)   LMP 05/11/2016   SpO2 100%   BMI 19.51 kg/m   Wt Readings from Last 3 Encounters:  08/31/19 96 lb 9.6 oz (43.8 kg)  07/10/19 97 lb 3.2 oz (44.1 kg)  05/31/19  93 lb 12.8 oz (42.5 kg)    Physical Exam  General appearance - alert, well appearing, and in no distress Mental status - normal mood, behavior, speech, dress, motor activity, and thought processes Mouth - mucous membranes moist, pharynx normal without lesions Neck - supple, no significant adenopathy Chest - clear to auscultation, no wheezes, rales or rhonchi, symmetric air entry Heart - normal rate, regular rhythm, normal S1, S2, no murmurs, rubs, clicks or gallops Abdomen - soft, nontender, nondistended, no masses or organomegaly Extremities - peripheral pulses normal, no pedal edema, no clubbing or cyanosis   CMP Latest Ref Rng & Units 05/30/2019 04/13/2019 03/19/2019  Glucose 65 - 99 mg/dL 735(H) 299(M) 426(S)  BUN 8 - 27 mg/dL 17 25 18   Creatinine 0.57 - 1.00 mg/dL 3.41) 9.62(I  Sodium 134 - 144 mmol/L 135 136 133(L)  Potassium 3.5 - 5.2 mmol/L 4.4 5.2 4.3  Chloride 96 - 106 mmol/L 95(L) 96 99  CO2 20 - 29 mmol/L 29 25 27   Calcium 8.7 - 10.3 mg/dL 9.9 9.6 2.97)  Total Protein 6.0 - 8.5 g/dL 6.5 - -  Total Bilirubin 0.0 - 1.2 mg/dL 0.7 - -  Alkaline Phos 39 - 117 IU/L 63 - -  AST 0 - 40 IU/L 28 - -  ALT 0 - 32 IU/L 33(H) - -   Lipid Panel     Component Value Date/Time   CHOL 129 05/30/2019 1431   TRIG 189 (H) 05/30/2019 1431   HDL 42 05/30/2019 1431   CHOLHDL 3.1 05/30/2019 1431   CHOLHDL 2.7 03/18/2019 0358   VLDL 13 03/18/2019 0358   LDLCALC 56 05/30/2019 1431    CBC    Component Value Date/Time   WBC 3.1 (L) 05/30/2019 1432   WBC 4.7 03/19/2019 0352   RBC 4.23 05/30/2019 1432   RBC 3.66 (L) 03/19/2019 0352   HGB 9.3 (L) 05/30/2019 1432   HCT 29.8 (L) 05/30/2019 1432   PLT 74 (LL) 05/30/2019 1432   MCV 70 (L) 05/30/2019 1432   MCH 22.0 (L) 05/30/2019 1432   MCH 21.6 (L) 03/19/2019 0352   MCHC 31.2 (L) 05/30/2019 1432   MCHC 33.6 03/19/2019 0352   RDW 16.3 (H) 05/30/2019 1432   LYMPHSABS 0.9 05/30/2019 1432   MONOABS 0.2 12/06/2018 1408   EOSABS 0.1  05/30/2019 1432   BASOSABS 0.0 05/30/2019 1432    ASSESSMENT AND PLAN: 1. Type 2 diabetes mellitus with diabetic polyneuropathy, with long-term current use of insulin (HCC) Reported blood sugars not at goal.  Increase Lantus to 16 units and NovoLog to 6 units with breakfast and dinner.  Advised to bring log of blood sugar readings with her on her next visit with me in 6 weeks.  Healthy eating habits encouraged. - insulin glargine (LANTUS SOLOSTAR) 100 UNIT/ML Solostar Pen; Inject 16 Units into the skin at bedtime.  Dispense: 5 pen; Refill: PRN - insulin aspart (NOVOLOG FLEXPEN) 100 UNIT/ML FlexPen; 6 units with breakfast and dinner  Dispense: 15 mL; Refill: 11 - CBC - Comprehensive metabolic panel  2. Essential hypertension At goal.  Continue Norvasc and Cozaar.  Advised to bring all medicines with her on next visit.  3. Pancytopenia (HCC) Discussed my concern about her blood findings and the need for 07/28/2019 to get an opinion from a specialist.  Her son expressed understanding.  He states they will try to make arrangement for transportation once appointment is set again - Ambulatory referral to Hematology  4. Screening for colon cancer Discussed colon cancer screening.  Patient willing to be screened.  Referral sent. - Ambulatory referral to Gastroenterology     Patient was given the opportunity to ask questions.  Patient verbalized understanding of the plan and was able to repeat key elements of the plan.   Orders Placed This Encounter  Procedures  . CBC  . Comprehensive metabolic panel  . Ambulatory referral to Gastroenterology  . Ambulatory referral to Hematology  . POCT glucose (manual entry)     Requested Prescriptions   Signed Prescriptions Disp Refills  . insulin glargine (LANTUS SOLOSTAR) 100 UNIT/ML Solostar Pen 5 pen PRN    Sig: Inject  16 Units into the skin at bedtime.  . insulin aspart (NOVOLOG FLEXPEN) 100 UNIT/ML FlexPen 15 mL 11    Sig: 6 units with breakfast  and dinner    Return in about 6 weeks (around 10/12/2019).  Jonah Blue, MD, FACP

## 2019-09-01 LAB — COMPREHENSIVE METABOLIC PANEL
ALT: 33 IU/L — ABNORMAL HIGH (ref 0–32)
AST: 22 IU/L (ref 0–40)
Albumin/Globulin Ratio: 1.4 (ref 1.2–2.2)
Albumin: 4.1 g/dL (ref 3.7–4.7)
Alkaline Phosphatase: 89 IU/L (ref 39–117)
BUN/Creatinine Ratio: 32 — ABNORMAL HIGH (ref 12–28)
BUN: 34 mg/dL — ABNORMAL HIGH (ref 8–27)
Bilirubin Total: 0.5 mg/dL (ref 0.0–1.2)
CO2: 23 mmol/L (ref 20–29)
Calcium: 9.7 mg/dL (ref 8.7–10.3)
Chloride: 97 mmol/L (ref 96–106)
Creatinine, Ser: 1.06 mg/dL — ABNORMAL HIGH (ref 0.57–1.00)
GFR calc Af Amer: 61 mL/min/{1.73_m2} (ref >59)
GFR calc non Af Amer: 53 mL/min/{1.73_m2} — ABNORMAL LOW (ref >59)
Globulin, Total: 2.9 g/dL (ref 1.5–4.5)
Glucose: 354 mg/dL — ABNORMAL HIGH (ref 65–99)
Potassium: 4.9 mmol/L (ref 3.5–5.2)
Sodium: 134 mmol/L (ref 134–144)
Total Protein: 7 g/dL (ref 6.0–8.5)

## 2019-09-01 LAB — CBC
Hematocrit: 27.5 % — ABNORMAL LOW (ref 34.0–46.6)
Hemoglobin: 8.2 g/dL — ABNORMAL LOW (ref 11.1–15.9)
MCH: 21.6 pg — ABNORMAL LOW (ref 26.6–33.0)
MCHC: 29.8 g/dL — ABNORMAL LOW (ref 31.5–35.7)
MCV: 73 fL — ABNORMAL LOW (ref 79–97)
Platelets: 89 10*3/uL — CL (ref 150–450)
RBC: 3.79 x10E6/uL (ref 3.77–5.28)
RDW: 16.6 % — ABNORMAL HIGH (ref 11.7–15.4)
WBC: 5 10*3/uL (ref 3.4–10.8)

## 2019-09-01 NOTE — Addendum Note (Signed)
Addended by: Jonah Blue B on: 09/01/2019 09:00 AM   Modules accepted: Orders

## 2019-09-03 ENCOUNTER — Other Ambulatory Visit: Payer: Self-pay | Admitting: Internal Medicine

## 2019-09-03 DIAGNOSIS — D509 Iron deficiency anemia, unspecified: Secondary | ICD-10-CM

## 2019-09-03 NOTE — Progress Notes (Signed)
Let pt and her son or daughter know that she has a worsening anemia and other abnormal blood cells.  It is very important that she gets in to see the specialist about this.  They will call them with an appointment.  Please return to the laboratory to have additional blood tests done to check iron levels.  Kidney function is not 100%.  She has a mild but stable elevation in one of her liver enzymes.

## 2019-09-04 ENCOUNTER — Telehealth: Payer: Self-pay

## 2019-09-04 NOTE — Telephone Encounter (Signed)
Contacted pt contacts to go over lab results none of her contacts would answer lvm. Will send letter out

## 2019-09-06 ENCOUNTER — Telehealth: Payer: Self-pay

## 2019-09-06 ENCOUNTER — Other Ambulatory Visit: Payer: Self-pay | Admitting: Internal Medicine

## 2019-09-06 DIAGNOSIS — IMO0002 Reserved for concepts with insufficient information to code with codable children: Secondary | ICD-10-CM

## 2019-09-06 DIAGNOSIS — E1165 Type 2 diabetes mellitus with hyperglycemia: Secondary | ICD-10-CM

## 2019-09-06 LAB — IRON,TIBC AND FERRITIN PANEL
Ferritin: 414 ng/mL — ABNORMAL HIGH (ref 15–150)
Iron Saturation: 28 % (ref 15–55)
Iron: 77 ug/dL (ref 27–139)
Total Iron Binding Capacity: 275 ug/dL (ref 250–450)
UIBC: 198 ug/dL (ref 118–369)

## 2019-09-06 LAB — SPECIMEN STATUS REPORT

## 2019-09-06 NOTE — Telephone Encounter (Signed)
Will send letter out regarding results

## 2019-09-06 NOTE — Progress Notes (Signed)
Let patient and her daughter know that the type of anemia that she has is not due to iron deficiency.  It is very important that she keep the appointment with the blood specialist/hematologist once they have called with an appointment.

## 2019-09-15 ENCOUNTER — Ambulatory Visit: Payer: Medicare Other | Attending: Internal Medicine

## 2019-09-15 ENCOUNTER — Other Ambulatory Visit: Payer: Self-pay

## 2019-09-15 DIAGNOSIS — D509 Iron deficiency anemia, unspecified: Secondary | ICD-10-CM

## 2019-09-16 LAB — IRON,TIBC AND FERRITIN PANEL
Ferritin: 328 ng/mL — ABNORMAL HIGH (ref 15–150)
Iron Saturation: 21 % (ref 15–55)
Iron: 55 ug/dL (ref 27–139)
Total Iron Binding Capacity: 266 ug/dL (ref 250–450)
UIBC: 211 ug/dL (ref 118–369)

## 2019-10-12 ENCOUNTER — Encounter: Payer: Self-pay | Admitting: Internal Medicine

## 2019-10-17 ENCOUNTER — Other Ambulatory Visit: Payer: Self-pay

## 2019-10-17 ENCOUNTER — Ambulatory Visit: Payer: Medicare Other | Attending: Internal Medicine | Admitting: Internal Medicine

## 2019-10-17 ENCOUNTER — Encounter: Payer: Self-pay | Admitting: Internal Medicine

## 2019-10-17 VITALS — BP 165/84 | HR 73 | Temp 97.2°F | Resp 16 | Wt 101.0 lb

## 2019-10-17 DIAGNOSIS — Z794 Long term (current) use of insulin: Secondary | ICD-10-CM

## 2019-10-17 DIAGNOSIS — D61818 Other pancytopenia: Secondary | ICD-10-CM | POA: Diagnosis not present

## 2019-10-17 DIAGNOSIS — Z8611 Personal history of tuberculosis: Secondary | ICD-10-CM | POA: Insufficient documentation

## 2019-10-17 DIAGNOSIS — I10 Essential (primary) hypertension: Secondary | ICD-10-CM | POA: Insufficient documentation

## 2019-10-17 DIAGNOSIS — E119 Type 2 diabetes mellitus without complications: Secondary | ICD-10-CM | POA: Diagnosis present

## 2019-10-17 DIAGNOSIS — Z91013 Allergy to seafood: Secondary | ICD-10-CM | POA: Diagnosis not present

## 2019-10-17 DIAGNOSIS — Z79899 Other long term (current) drug therapy: Secondary | ICD-10-CM | POA: Diagnosis not present

## 2019-10-17 DIAGNOSIS — E1142 Type 2 diabetes mellitus with diabetic polyneuropathy: Secondary | ICD-10-CM | POA: Diagnosis not present

## 2019-10-17 LAB — POCT GLYCOSYLATED HEMOGLOBIN (HGB A1C): HbA1c, POC (controlled diabetic range): 7.6 % — AB (ref 0.0–7.0)

## 2019-10-17 LAB — GLUCOSE, POCT (MANUAL RESULT ENTRY): POC Glucose: 267 mg/dl — AB (ref 70–99)

## 2019-10-17 NOTE — Progress Notes (Signed)
Patient ID: Olivia Williamson, female    DOB: 1945/06/14  MRN: [id number]  CC: Diabetes and Hypertension   Subjective: Olivia Williamson is a 74 y.o. female who presents for chronic ds management.  Daughter, Zeiger and Leodis Liverpool from [employer] are with her. 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  Last seen 07/10/2019  Patient was last seen last month.  She brings her medications with her.  DM: She checks blood sugars twice a day.  She does not have a log with her.  She tells me that her blood sugars continue to show a wide fluctuation sometimes in the 300s.  Reports compliance with taking her Lantus Units daily and NovoLog 6 units with breakfast and dinner.   Tingling in legs at nights and some cramp in calf since last yr.  HTN:  BP elevated today.  She has taken amlodipine.  She tells me that she has been out of the Cozaar for about 2 weeks.  The bottle that she has with her hands Cozaar 25 mg not 100 mg.  Referred to gastroenterology for colon cancer screening on last visit.  They have tried calling her several times to schedule but have not been able to reach her.  The left messages.  The preferred phone number in chart is not recognized by the daughter or the patient.  I have told them when they go through checkout to have the phone numbers updated in the system with the daughter's phone number. I also resubmitted a referral for her to see the hematologist for the pancytopenia.  No appointment as yet.  HM: does not want COVID vaccine Patient Active Problem List   Diagnosis Date Noted  . Allergic rhinitis due to allergen 05/02/2019  . Periodontal disease 05/02/2019  . Chronic cough 04/13/2019  . Non-intractable vomiting   . Community acquired pneumonia 03/16/2019  .  Positive for macroalbuminuria 02/05/2019  . 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 Lumbar radiculopathy 02/02/2017  . Chronic left-sided thoracic back pain 02/02/2017  . 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 Multinodular goiter 11/01/2016  . Thyroid nodule 04/13/2016  . History of tuberculosis 01/09/2016  . Generalized weakness 12/15/2015  . Pancytopenia ([place]) 12/15/2015  . Essential hypertension   . GERD (gastroesophageal reflux disease)   . Diabetes mellitus with neurological manifestations [facility])      Current Outpatient Medications on File Prior to Visit  Medication Sig Dispense Refill  . amLODipine (NORVASC) 5 MG tablet Take 1 tablet (5 mg total) by mouth daily. 30 tablet 6  . calcium carbonate (OS-CAL) 600 MG tablet Take 1 tablet (600 mg total) by mouth 2 (two) times daily with a meal. 60 tablet 6  . fluticasone (FLONASE) 50 MCG/ACT nasal spray Place 2 sprays into both nostrils daily. 16 g 6  . gabapentin (NEURONTIN) 300 MG capsule TAKE 2 CAPSULES (600 MG TOTAL) BY MOUTH AT BEDTIME. 60 capsule 6  . insulin aspart (NOVOLOG FLEXPEN) 100 UNIT/ML FlexPen 6 units with breakfast and dinner 15 mL 11  . insulin glargine (LANTUS SOLOSTAR) 100 UNIT/ML Solostar Pen Inject 16 Units into the skin at bedtime. 5 pen PRN  . Insulin Pen Needle (B-D UF III MINI PEN NEEDLES) 31G X 5 MM MISC USE TO INJECT INSULIN 100 each 2  . losartan (COZAAR) 100  MG tablet Take 1 tablet (100 mg total) by mouth daily. 30 tablet 3  . Vitamin D, Cholecalciferol, 10 MCG (400 UNIT) CAPS Take 800 Units by mouth daily. 120 capsule 6   No current facility-administered medications on file prior to visit.    Allergies  Allergen Reactions  . Shrimp [Shellfish Allergy]     Sneezing and SOB  . Beef-Derived Products Other (See Comments)    Generalized burning sensation  . Fruit & Vegetable Daily [Nutritional Supplements] Other (See Comments)    Orange causes lower  extremity burning    Social History   Socioeconomic History  . Marital status: Widowed    Spouse name: Not on file  . Number of children: Not on file  . Years of education: Not on file  . Highest education level: Not on file  Occupational History  . Not on file  Tobacco Use  . Smoking status: Never Smoker  . Smokeless tobacco: Never Used  Substance and Sexual Activity  . Alcohol use: No  . Drug use: No  . Sexual activity: Not on file  Other Topics Concern  . Not on file  Social History Narrative  . Not on file   Social Determinants of Health   Financial Resource Strain:   . Difficulty of Paying Living Expenses:   Food Insecurity:   . Worried About Programme researcher, broadcasting/film/video in the Last Year:   . Barista in the Last Year:   Transportation Needs:   . Freight forwarder (Medical):   Marland Kitchen Lack of Transportation (Non-Medical):   Physical Activity:   . Days of Exercise per Week:   . Minutes of Exercise per Session:   Stress:   . Feeling of Stress :   Social Connections:   . Frequency of Communication with Friends and Family:   . Frequency of Social Gatherings with Friends and Family:   . Attends Religious Services:   . Active Member of Clubs or Organizations:   . Attends Banker Meetings:   Marland Kitchen Marital Status:   Intimate Partner Violence:   . Fear of Current or Ex-Partner:   . Emotionally Abused:   Marland Kitchen Physically Abused:   . Sexually Abused:     Family History  Problem Relation Age of Onset  . Heart disease Maternal Uncle 82    Past Surgical History:  Procedure Laterality Date  . ABDOMINAL HYSTERECTOMY    . CHOLECYSTECTOMY N/A 05/02/2014   Procedure: LAPAROSCOPIC CHOLECYSTECTOMY ;  Surgeon: Abigail Miyamoto, MD;  [facility];  Service: General;  Laterality: N/A;  laparoscopic cholecystectomy  . COLONOSCOPY    . NO PAST SURGERIES    . VIDEO BRONCHOSCOPY Bilateral 01/10/2016   Procedure: VIDEO BRONCHOSCOPY WITHOUT FLUORO;  Surgeon: Leslye Peer, MD;  Location: [facility] ENDOSCOPY;  Service: Cardiopulmonary;  Laterality: Bilateral;    ROS: Review of Systems Negative except as stated above  PHYSICAL EXAM: BP (!) 165/84   Pulse 73   Temp (!) 97.2 F (36.2 C)   Resp 16   Wt 101 lb (45.8 kg)   LMP 05/11/2016   SpO2 98%   BMI 20.40 kg/m   Wt Readings from Last 3 Encounters:  10/17/19 101 lb (45.8 kg)  08/31/19 96 lb 9.6 oz (43.8 kg)  07/10/19 97 lb 3.2 oz (44.1 kg)    Physical Exam  General appearance - alert, well appearing, and in no distress Mental status - normal mood, behavior, speech, dress, motor activity, and thought processes  Chest - clear to auscultation, no wheezes, rales or rhonchi, symmetric air entry Heart - normal rate, regular rhythm, normal S1, S2, no murmurs, rubs, clicks or gallops Extremities - peripheral pulses normal, no pedal edema, no clubbing or cyanosis   CMP Latest Ref Rng & Units 08/31/2019 05/30/2019 04/13/2019  Glucose 65 - 99 mg/dL 622(Q) 333(L) 456(Y)  BUN 8 - 27 mg/dL 56(L) 17 25  Creatinine 0.57 - 1.00 mg/dL 8.93(T) 3.42 8.76(O)  Sodium 134 - 144 mmol/L 134 135 136  Potassium 3.5 - 5.2 mmol/L 4.9 4.4 5.2  Chloride 96 - 106 mmol/L 97 95(L) 96  CO2 20 - 29 mmol/L 23 29 25   Calcium 8.7 - 10.3 mg/dL 9.7 9.9 9.6  Total Protein 6.0 - 8.5 g/dL 7.0 6.5 -  Total Bilirubin 0.0 - 1.2 mg/dL 0.5 0.7 -  Alkaline Phos 39 - 117 IU/L 89 63 -  AST 0 - 40 IU/L 22 28 -  ALT 0 - 32 IU/L 33(H) 33(H) -   Lipid Panel     Component Value Date/Time   CHOL 129 05/30/2019 1431   TRIG 189 (H) 05/30/2019 1431   HDL 42 05/30/2019 1431   CHOLHDL 3.1 05/30/2019 1431   CHOLHDL 2.7 03/18/2019 0358   VLDL 13 03/18/2019 0358   LDLCALC 56 05/30/2019 1431    CBC    Component Value Date/Time   WBC 5.0 08/31/2019 1143   WBC 4.7 03/19/2019 0352   RBC 3.79 08/31/2019 1143   RBC 3.66 (L) 03/19/2019 0352   HGB 8.2 (L) 08/31/2019 1143   HCT 27.5 (L) 08/31/2019 1143   PLT 89 (LL) 08/31/2019 1143   MCV 73 (L)  08/31/2019 1143   MCH 21.6 (L) 08/31/2019 1143   MCH 21.6 (L) 03/19/2019 0352   MCHC 29.8 (L) 08/31/2019 1143   MCHC 33.6 03/19/2019 0352   RDW 16.6 (H) 08/31/2019 1143   LYMPHSABS 0.9 05/30/2019 1432   MONOABS 0.2 12/06/2018 1408   EOSABS 0.1 05/30/2019 1432   BASOSABS 0.0 05/30/2019 1432   Results for orders placed or performed in visit on 10/17/19  POCT glucose (manual entry)  Result Value Ref Range   POC Glucose 267 (A) 70 - 99 mg/dl  POCT glycosylated hemoglobin (Hb A1C)  Result Value Ref Range   Hemoglobin A1C     HbA1c POC (<> result, manual entry)     HbA1c, POC (prediabetic range)     HbA1c, POC (controlled diabetic range) 7.6 (A) 0.0 - 7.0 %    ASSESSMENT AND PLAN:  1. Type 2 diabetes mellitus with diabetic polyneuropathy, with long-term current use of insulin (HCC) A1c today is 7.6 which is not at goal.  I have not made any changes in her insulin as she did not bring a log with her.  Advised patient and daughter to make sure that she writes down her blood sugar readings and also bring her device with her when she comes on future visits.  Dietary counseling given - POCT glucose (manual entry) - POCT glycosylated hemoglobin (Hb A1C)  2. Essential hypertension Not at goal.  She plans to refill Cozaar today  3. Pancytopenia (HCC) Patient ~update the correct phone number in the system when she goes through checkout so that they can be reached appropriately to schedule appointments.    Patient was given the opportunity to ask questions.  Patient verbalized understanding of the plan and was able to repeat key elements of the plan.   Orders Placed This Encounter  Procedures  .  POCT glucose (manual entry)  . POCT glycosylated hemoglobin (Hb A1C)     Requested Prescriptions    No prescriptions requested or ordered in this encounter    No follow-ups on file.  Jonah Blue, MD, FACP

## 2019-11-01 ENCOUNTER — Other Ambulatory Visit: Payer: Self-pay | Admitting: Internal Medicine

## 2019-11-01 DIAGNOSIS — E1142 Type 2 diabetes mellitus with diabetic polyneuropathy: Secondary | ICD-10-CM

## 2020-01-02 ENCOUNTER — Other Ambulatory Visit: Payer: Self-pay | Admitting: Internal Medicine

## 2020-01-02 NOTE — Telephone Encounter (Signed)
Requested Prescriptions  Pending Prescriptions Disp Refills  . calcium carbonate (OS-CAL) 600 MG tablet [Pharmacy Med Name: [facility] CALCIUM 600 MG TABLET] 180 tablet 1    Sig: Take 1 tablet (600 mg total) by mouth 2 (two) times daily with a meal.     There is no refill protocol information for this order

## 2020-02-13 ENCOUNTER — Encounter: Payer: Self-pay | Admitting: Internal Medicine

## 2020-02-13 ENCOUNTER — Other Ambulatory Visit: Payer: Self-pay

## 2020-02-13 ENCOUNTER — Ambulatory Visit: Payer: Medicare Other | Attending: Internal Medicine | Admitting: Internal Medicine

## 2020-02-13 VITALS — BP 197/98 | HR 76 | Temp 98.0°F | Resp 16 | Wt 103.2 lb

## 2020-02-13 DIAGNOSIS — M5416 Radiculopathy, lumbar region: Secondary | ICD-10-CM | POA: Insufficient documentation

## 2020-02-13 DIAGNOSIS — Z9049 Acquired absence of other specified parts of digestive tract: Secondary | ICD-10-CM | POA: Diagnosis not present

## 2020-02-13 DIAGNOSIS — E042 Nontoxic multinodular goiter: Secondary | ICD-10-CM | POA: Diagnosis not present

## 2020-02-13 DIAGNOSIS — Z79899 Other long term (current) drug therapy: Secondary | ICD-10-CM | POA: Insufficient documentation

## 2020-02-13 DIAGNOSIS — Z794 Long term (current) use of insulin: Secondary | ICD-10-CM | POA: Diagnosis not present

## 2020-02-13 DIAGNOSIS — D638 Anemia in other chronic diseases classified elsewhere: Secondary | ICD-10-CM | POA: Diagnosis not present

## 2020-02-13 DIAGNOSIS — M546 Pain in thoracic spine: Secondary | ICD-10-CM | POA: Diagnosis not present

## 2020-02-13 DIAGNOSIS — Z23 Encounter for immunization: Secondary | ICD-10-CM

## 2020-02-13 DIAGNOSIS — Z1231 Encounter for screening mammogram for malignant neoplasm of breast: Secondary | ICD-10-CM | POA: Diagnosis not present

## 2020-02-13 DIAGNOSIS — E1142 Type 2 diabetes mellitus with diabetic polyneuropathy: Secondary | ICD-10-CM | POA: Diagnosis present

## 2020-02-13 DIAGNOSIS — D560 Alpha thalassemia: Secondary | ICD-10-CM | POA: Diagnosis not present

## 2020-02-13 DIAGNOSIS — I10 Essential (primary) hypertension: Secondary | ICD-10-CM | POA: Diagnosis not present

## 2020-02-13 DIAGNOSIS — D61818 Other pancytopenia: Secondary | ICD-10-CM | POA: Insufficient documentation

## 2020-02-13 DIAGNOSIS — Z2821 Immunization not carried out because of patient refusal: Secondary | ICD-10-CM | POA: Diagnosis not present

## 2020-02-13 DIAGNOSIS — E222 Syndrome of inappropriate secretion of antidiuretic hormone: Secondary | ICD-10-CM | POA: Insufficient documentation

## 2020-02-13 LAB — POCT GLYCOSYLATED HEMOGLOBIN (HGB A1C): HbA1c, POC (controlled diabetic range): 8.5 % — AB (ref 0.0–7.0)

## 2020-02-13 LAB — GLUCOSE, POCT (MANUAL RESULT ENTRY): POC Glucose: 337 mg/dl — AB (ref 70–99)

## 2020-02-13 MED ORDER — AMLODIPINE BESYLATE 5 MG PO TABS: 5.0000 mg | ORAL_TABLET | Freq: Every day | ORAL | 6 refills | Status: DC

## 2020-02-13 MED ORDER — LANTUS SOLOSTAR 100 UNIT/ML ~~LOC~~ SOPN: 20.0000 [IU] | PEN_INJECTOR | Freq: Every day | SUBCUTANEOUS | 6 refills | Status: DC

## 2020-02-13 MED ORDER — LOSARTAN POTASSIUM 100 MG PO TABS: 100.0000 mg | ORAL_TABLET | Freq: Every day | ORAL | 6 refills | Status: DC

## 2020-02-13 NOTE — Progress Notes (Signed)
Patient ID: Olivia Williamson, female    DOB: Aug 17, 1945  MRN: [id number]  CC: Diabetes and Hypertension   Subjective: Olivia BuddMoihh Nodal is a 74 y.o. female who presents for chronic ds management. Daughter, Annya and interpreter [place]ung from [employer] are with her. 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   DIABETES TYPE 2 Last A1C:   Results for orders placed or performed in visit on 02/13/20  POCT glucose (manual entry)  Result Value Ref Range   POC Glucose 337 (A) 70 - 99 mg/dl  POCT glycosylated hemoglobin (Hb A1C)  Result Value Ref Range   Hemoglobin A1C     HbA1c POC (<> result, manual entry)     HbA1c, POC (prediabetic range)     HbA1c, POC (controlled diabetic range) 8.5 (A) 0.0 - 7.0 %    Med Adherence:  [x]  Yes  -Lantus 16 units at nights and Novolog 6 units with BF and dinner Medication side effects:  []  Yes    [x]  No Home Monitoring?  [x]  Yes  BID. Has meter with her today.  BS have been in 300-400 range  Home glucose results range: 30 day average is 391 Diet Adherence: she tries to eat healthy Exercise: []  Yes    [x]  No Hypoglycemic episodes?: []  Yes    [x]  No Numbness of the feet? []  Yes    [x]  No but pain in feet that radiates to hips. Taking Gabapentin Retinopathy hx? []  Yes    []  No Last eye exam: over due for eye exam.  Agreeable to referral.  I tried to verify with daughter today that phone #s in EMR are correct.  I advise them to update this info on last visit.  I showed her the primary phone number in the EMR for the pt.  Daughter does not recognize this phone number Comments:   HTN:  BP elevated today.  She has an empty bottle of Norvasc with her. Said she has been out of it for 2 mths.   Does not have Losartan with her.  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 30 tablet 6   B-D UF III MINI PEN NEEDLES 31G X 5 MM MISC USE TO INJECT INSULIN 100 each 2   calcium carbonate (OS-CAL) 600 MG tablet Take 1 tablet (600 mg total) by mouth 2 (two) times daily with a meal. 180 tablet 1   fluticasone (FLONASE) 50 MCG/ACT nasal spray Place 2 sprays into both nostrils daily. 16  g 6   gabapentin (NEURONTIN) 300 MG capsule TAKE 2 CAPSULES (600 MG TOTAL) BY MOUTH AT BEDTIME. 60 capsule 6   insulin aspart (NOVOLOG FLEXPEN) 100 UNIT/ML FlexPen 6 units with breakfast and dinner 15 mL 11   insulin glargine (LANTUS SOLOSTAR) 100 UNIT/ML Solostar Pen Inject 16 Units into the skin at bedtime. 5 pen PRN   losartan (COZAAR) 100 MG tablet Take 1 tablet (100 mg total) by mouth daily. 30 tablet 3   Vitamin D, Cholecalciferol, 10 MCG (400 UNIT) CAPS Take 800 Units by mouth daily. 120 capsule 6   No current facility-administered medications on file prior to visit.    Allergies  Allergen Reactions   Shrimp [Shellfish Allergy]     Sneezing and  SOB   Beef-Derived Products Other (See Comments)    Generalized burning sensation   Fruit & Vegetable Daily [Nutritional Supplements] Other (See Comments)    Orange causes lower extremity burning    Social History   Socioeconomic History   Marital status: Widowed    Spouse name: Not on file   Number of children: Not on file   Years of education: Not on file   Highest education level: Not on file  Occupational History   Not on file  Tobacco Use   Smoking status: Never Smoker   Smokeless tobacco: Never Used  Substance and Sexual Activity   Alcohol use: No   Drug use: No   Sexual activity: Not on file  Other Topics Concern   Not on file  Social History Narrative   Not on file   Social Determinants of Health   Financial Resource Strain:    Difficulty of Paying Living Expenses: Not on file  Food Insecurity:    Worried About Running Out of Food in the Last Year: Not on file   Ran Out of Food in the Last Year: Not on file  Transportation Needs:    Lack of Transportation (Medical): Not on file   Lack of Transportation (Non-Medical): Not on file  Physical Activity:    Days of Exercise per Week: Not on file   Minutes of Exercise per Session: Not on file  Stress:    Feeling of Stress : Not on file  Social Connections:    Frequency of Communication with Friends and Family: Not on file   Frequency of Social Gatherings with Friends and Family: Not on file   Attends Religious Services: Not on file   Active Member of Clubs or Organizations: Not on file   Attends Banker Meetings: Not on file   Marital Status: Not on file  Intimate Partner Violence:    Fear of Current or Ex-Partner: Not on file   Emotionally Abused: Not on file   Physically Abused: Not on file   Sexually Abused: Not on file    Family History  Problem Relation Age of Onset   Heart disease Maternal Uncle 100    Past Surgical History:  Procedure Laterality Date     ABDOMINAL HYSTERECTOMY     CHOLECYSTECTOMY N/A 05/02/2014   Procedure: LAPAROSCOPIC CHOLECYSTECTOMY ;  Surgeon: Abigail Miyamoto, MD;  Location: [facility] OR;  Service: General;  Laterality: N/A;  laparoscopic cholecystectomy   COLONOSCOPY     NO PAST SURGERIES     VIDEO BRONCHOSCOPY Bilateral 01/10/2016   Procedure: VIDEO BRONCHOSCOPY WITHOUT FLUORO;  Surgeon: Leslye Peer, MD;  Location: [facility] ENDOSCOPY;  Service: Cardiopulmonary;  Laterality: Bilateral;    ROS: Review of Systems Negative except as  stated above  PHYSICAL EXAM: BP (!) 197/98    Pulse 76    Temp 98 F (36.7 C)    Resp 16    Wt 103 lb 3.2 oz (46.8 kg)    LMP 05/11/2016    SpO2 100%    BMI 20.84 kg/m   Wt Readings from Last 3 Encounters:  02/13/20 103 lb 3.2 oz (46.8 kg)  10/17/19 101 lb (45.8 kg)  08/31/19 96 lb 9.6 oz (43.8 kg)    Physical Exam  General appearance - elderly frail appearing female in NAD Mental status - pt is a difficult historian.  Daughter helps with giving history Mouth - mucous membranes moist, pharynx normal without lesions Neck - supple, no significant adenopathy Chest - few fine crackles LT base.  Other lung fields are clear. Heart - normal rate, regular rhythm, normal S1, S2, no murmurs, rubs, clicks or gallops Extremities - peripheral pulses normal, no pedal edema, no clubbing or cyanosis   CMP Latest Ref Rng & Units 08/31/2019 05/30/2019 04/13/2019  Glucose 65 - 99 mg/dL 073(X) 106(Y) 694(W)  BUN 8 - 27 mg/dL 54(O) 17 25  Creatinine 0.57 - 1.00 mg/dL 2.70(J) 5.00 9.38(H)  Sodium 134 - 144 mmol/L 134 135 136  Potassium 3.5 - 5.2 mmol/L 4.9 4.4 5.2  Chloride 96 - 106 mmol/L 97 95(L) 96  CO2 20 - 29 mmol/L 23 29 25   Calcium 8.7 - 10.3 mg/dL 9.7 9.9 9.6  Total Protein 6.0 - 8.5 g/dL 7.0 6.5 -  Total Bilirubin 0.0 - 1.2 mg/dL 0.5 0.7 -  Alkaline Phos 39 - 117 IU/L 89 63 -  AST 0 - 40 IU/L 22 28 -  ALT 0 - 32 IU/L 33(H) 33(H) -   Lipid Panel     Component Value Date/Time   CHOL 129  05/30/2019 1431   TRIG 189 (H) 05/30/2019 1431   HDL 42 05/30/2019 1431   CHOLHDL 3.1 05/30/2019 1431   CHOLHDL 2.7 03/18/2019 0358   VLDL 13 03/18/2019 0358   LDLCALC 56 05/30/2019 1431    CBC    Component Value Date/Time   WBC 5.0 08/31/2019 1143   WBC 4.7 03/19/2019 0352   RBC 3.79 08/31/2019 1143   RBC 3.66 (L) 03/19/2019 0352   HGB 8.2 (L) 08/31/2019 1143   HCT 27.5 (L) 08/31/2019 1143   PLT 89 (LL) 08/31/2019 1143   MCV 73 (L) 08/31/2019 1143   MCH 21.6 (L) 08/31/2019 1143   MCH 21.6 (L) 03/19/2019 0352   MCHC 29.8 (L) 08/31/2019 1143   MCHC 33.6 03/19/2019 0352   RDW 16.6 (H) 08/31/2019 1143   LYMPHSABS 0.9 05/30/2019 1432   MONOABS 0.2 12/06/2018 1408   EOSABS 0.1 05/30/2019 1432   BASOSABS 0.0 05/30/2019 1432    ASSESSMENT AND PLAN: 1. Type 2 diabetes mellitus with diabetic polyneuropathy, with long-term current use of insulin (HCC) A1c and blood sugars not at goal.  Encourage healthy eating habits.  Increase Lantus to 20 units daily.  Continue to check blood sugars daily.  Follow-up with clinical pharmacist in 1 month with blood sugar readings. - POCT glucose (manual entry) - POCT glycosylated hemoglobin (Hb A1C) - Ambulatory referral to Ophthalmology - insulin glargine (LANTUS SOLOSTAR) 100 UNIT/ML Solostar Pen; Inject 20 Units into the skin at bedtime.  Dispense: 15 mL; Refill: 6  2. Essential hypertension Not at goal.  She has been out of her medications.  I have printed the prescriptions for amlodipine and losartan and gave them to  her daughter so that she can take them to the pharmacy today to be filled. - amLODipine (NORVASC) 5 MG tablet; Take 1 tablet (5 mg total) by mouth daily.  Dispense: 30 tablet; Refill: 6 - losartan (COZAAR) 100 MG tablet; Take 1 tablet (100 mg total) by mouth daily.  Dispense: 30 tablet; Refill: 6  3. COVID-19 virus vaccination declined Strongly advised patient to get the COVID-19 vaccine to help protect herself, her family in the  community.  Patient declines  4. Encounter for screening mammogram for malignant neoplasm of breast - MM Digital Screening; Future  5. Need for immunization against influenza - Flu Vaccine QUAD 36+ mos IM    Patient was given the opportunity to ask questions.  Patient verbalized understanding of the plan and was able to repeat key elements of the plan.   Orders Placed This Encounter  Procedures   POCT glucose (manual entry)   POCT glycosylated hemoglobin (Hb A1C)     Requested Prescriptions    No prescriptions requested or ordered in this encounter    No follow-ups on file.  Jonah Blue, MD, FACP

## 2020-02-18 ENCOUNTER — Other Ambulatory Visit: Payer: Self-pay | Admitting: Internal Medicine

## 2020-02-18 NOTE — Telephone Encounter (Signed)
Requested medications are due for refill today?  Yes  Requested medications are on active medication list?  Yes  Last Refill: 12/30/2018  # 120 with 6 refills   Future visit scheduled?   Yes  Notes to Clinic:  Medication failed Rx refill protocol due to abnormal phosphate level and no Vitamin D level in the past 360 days.  Last Vitamin D level was on 12/30/2015.

## 2020-03-02 ENCOUNTER — Other Ambulatory Visit: Payer: Self-pay | Admitting: Internal Medicine

## 2020-03-02 DIAGNOSIS — Z794 Long term (current) use of insulin: Secondary | ICD-10-CM

## 2020-03-02 NOTE — Telephone Encounter (Signed)
Requested Prescriptions  Pending Prescriptions Disp Refills  . B-D UF III MINI PEN NEEDLES 31G X 5 MM MISC [Pharmacy Med Name: BD UF MINI PEN NEEDLE [id number]] 100 each 2    Sig: USE TO INJECT INSULIN     Endocrinology: Diabetes - Testing Supplies Passed - 03/02/2020  5:39 PM      Passed - Valid encounter within last 12 months    Recent Outpatient Visits          2 weeks ago Type 2 diabetes mellitus with diabetic polyneuropathy, with long-term current use of insulin (HCC)   [place] [facility] [place], Gavin Pound B, MD   4 months ago Type 2 diabetes mellitus with diabetic polyneuropathy, with long-term current use of insulin (HCC)   [place] [facility] [place], Gavin Pound B, MD   6 months ago Type 2 diabetes mellitus with diabetic polyneuropathy, with long-term current use of insulin (HCC)   [place] [facility] [place], Gavin Pound B, MD   7 months ago Type 2 diabetes mellitus with diabetic polyneuropathy, with long-term current use of insulin [facility])    [facility] Marcine Matar, MD   9 months ago Chronic cough   [facility] Storm Frisk, MD      Future Appointments            In 3 months [place] Binnie Rail, MD [facility]

## 2020-03-14 ENCOUNTER — Other Ambulatory Visit: Payer: Self-pay

## 2020-03-14 ENCOUNTER — Ambulatory Visit: Payer: Medicare Other | Attending: Internal Medicine | Admitting: Pharmacist

## 2020-03-14 ENCOUNTER — Encounter: Payer: Self-pay | Admitting: Pharmacist

## 2020-03-14 VITALS — BP 125/75 | HR 73

## 2020-03-14 DIAGNOSIS — E1142 Type 2 diabetes mellitus with diabetic polyneuropathy: Secondary | ICD-10-CM

## 2020-03-14 DIAGNOSIS — Z794 Long term (current) use of insulin: Secondary | ICD-10-CM | POA: Diagnosis not present

## 2020-03-14 LAB — GLUCOSE, POCT (MANUAL RESULT ENTRY): POC Glucose: 240 mg/dl — AB (ref 70–99)

## 2020-03-14 MED ORDER — NOVOLOG FLEXPEN 100 UNIT/ML ~~LOC~~ SOPN: PEN_INJECTOR | SUBCUTANEOUS | 11 refills | Status: DC

## 2020-03-14 NOTE — Patient Instructions (Signed)
Vietnamese translation:   C?m ?n b?n ? ??n g?p ti ngy hm nay. Vui lng lm nh? sau:  B?t ??u dng 20 ??n v? Lantus m?t l?n m?i ngy.  B?t ??u dng 8 ??n v? Humalog tr??c b?a sng v b?a t?i.  Ti?p t?c ki?m tra l??ng ???ng trong mu t?i nh. Theo di v?i ti sau 2 tu?n.     English translation:   Thank you for coming to see me today. Please do the following:  1. Start taking 20 units of Lantus once daily.  2. Start taking 8 units of Humalog before breakfast and dinner.  3. Continue checking blood sugars at home. 4. Follow-up with me in 2 weeks.

## 2020-03-14 NOTE — Progress Notes (Signed)
    S:    PCP: Dr. Laural Benes   No chief complaint on file.  Patient arrives in good spirits. Presents for diabetes evaluation, education, and management Patient was referred and last seen by Primary Care Provider on 02/13/2020. At that visit, pt's Lantus was increased to 20 units daily. Of note, her BP was elevated but she presented with an empty bottle of amlodipine and no losartan. Today, she has her bottle of amlodipine but still no losartan.   Patient reports Diabetes was diagnosed "many years ago".   Family/Social History:  - Fhx: heart disease - Tobacco: never smoker  - Alcohol: denies use   Insurance coverage/medication affordability: Medicare/Medicaid   Medication adherence reported with her insulin but is taking Lantus differently than prescribed.   Current diabetes medications include: Humalog 6 units BID, Lantus 20 units daily (continues to take 16 units daily) Current hypertension medications include: amlodipine 5 mg daily, losartan 100 mg daily  Current hyperlipidemia medications include:  None   Patient denies hypoglycemic events.  Patient reported dietary habits:  - she tries to eat healthy but is unable to describe a low carb diet - she denies eating sweets or drinking sweetened beverages   Patient-reported exercise habits:  - limited   Patient denies nocturia (nighttime urination).  Patient reports neuropathy (nerve pain). Patient denies visual changes. Patient reports self foot exams.     O:  POCT: 240  Lab Results  Component Value Date   HGBA1C 8.5 (A) 02/13/2020   Vitals:   03/14/20 0907  BP: 125/75  Pulse: 73    Lipid Panel     Component Value Date/Time   CHOL 129 05/30/2019 1431   TRIG 189 (H) 05/30/2019 1431   HDL 42 05/30/2019 1431   CHOLHDL 3.1 05/30/2019 1431   CHOLHDL 2.7 03/18/2019 0358   VLDL 13 03/18/2019 0358   LDLCALC 56 05/30/2019 1431    Home fasting blood sugars: 82 - 387  2 hour post-meal/random blood sugars: 268 -  589.   Clinical Atherosclerotic Cardiovascular Disease (ASCVD): No  The ASCVD Risk score Denman George DC Jr., et al., 2013) failed to calculate for the following reasons:   The valid total cholesterol range is 130 to 320 mg/dL    A/P: Diabetes longstanding currently uncontrolled. Patient is able to verbalize appropriate hypoglycemia management plan. Medication adherence is appropriate but patient has not increased her Lantus as instructed at her PCP visit. I've advised her to make this adjustment and increase her mealtime coverage.  -Inc Lantus to 20 units as prescribed.  -Inc Novolog to 8 units BID BEFORE meals. -Extensively discussed pathophysiology of diabetes, recommended lifestyle interventions, dietary effects on blood sugar control -Counseled on s/sx of and management of hypoglycemia -Next A1C anticipated 04/2020.   Hypertension longstanding currently at goal.  Blood pressure goal = <130/80 mmHg. Medication adherence is questionable. She endorses compliance with amlodipine but does not have her losartan with her. Her son is with her today and does not think she has this at home. Encouraged her to continue amlodipine only. Will send Dr. Laural Benes notification of this.  -Continue amlodipine 10 mg daily.  -Will take losartan off her list.   Written patient instructions provided.  Total time in face to face counseling 30 minutes.   Follow up Pharmacist Clinic Visit in 2 weeks.    Butch Penny, PharmD, CPP Clinical Pharmacist [facility] & [facility] [phone]

## 2020-03-28 ENCOUNTER — Other Ambulatory Visit: Payer: Self-pay

## 2020-03-28 ENCOUNTER — Encounter: Payer: Self-pay | Admitting: Pharmacist

## 2020-03-28 ENCOUNTER — Ambulatory Visit: Payer: Medicare Other | Attending: Internal Medicine | Admitting: Pharmacist

## 2020-03-28 DIAGNOSIS — E1142 Type 2 diabetes mellitus with diabetic polyneuropathy: Secondary | ICD-10-CM | POA: Diagnosis not present

## 2020-03-28 DIAGNOSIS — Z794 Long term (current) use of insulin: Secondary | ICD-10-CM | POA: Diagnosis not present

## 2020-03-28 MED ORDER — LANTUS SOLOSTAR 100 UNIT/ML ~~LOC~~ SOPN: 24.0000 [IU] | PEN_INJECTOR | Freq: Every day | SUBCUTANEOUS | 6 refills | Status: DC

## 2020-03-28 NOTE — Progress Notes (Signed)
    S:    PCP: Dr. Laural Benes   No chief complaint on file.  Patient arrives in good spirits. Presents for diabetes evaluation, education, and management Patient was referred and last seen by Primary Care Provider on 02/13/2020. Pharmacy saw her on 03/14/2020 and had her increase her insulin.   Family/Social History:  - Fhx: heart disease - Tobacco: never smoker  - Alcohol: denies use   Insurance coverage/medication affordability: Medicare/Medicaid   Medication adherence reported.  Current diabetes medications include: Humalog 8 units BID, Lantus 20 units daily  Current hypertension medications include: amlodipine 5 mg daily Current hyperlipidemia medications include:  None   Patient denies hypoglycemic events.  Patient reported dietary habits:  - she tries to eat healthy but is unable to describe a low carb diet - she denies eating sweets or drinking sweetened beverages   Patient-reported exercise habits:  - limited   Patient denies nocturia (nighttime urination).  Patient reports neuropathy (nerve pain). Patient denies visual changes. Patient reports self foot exams.     O:  Lab Results  Component Value Date   HGBA1C 8.5 (A) 02/13/2020   There were no vitals filed for this visit.  Lipid Panel     Component Value Date/Time   CHOL 129 05/30/2019 1431   TRIG 189 (H) 05/30/2019 1431   HDL 42 05/30/2019 1431   CHOLHDL 3.1 05/30/2019 1431   CHOLHDL 2.7 03/18/2019 0358   VLDL 13 03/18/2019 0358   LDLCALC 56 05/30/2019 1431    Home fasting blood sugars: 95 - 262  2 hour post-meal/random blood sugars: 214 - 388   Clinical Atherosclerotic Cardiovascular Disease (ASCVD): No  The ASCVD Risk score Denman George DC Jr., et al., 2013) failed to calculate for the following reasons:   The valid total cholesterol range is 130 to 320 mg/dL    A/P: Diabetes longstanding currently uncontrolled, but home glycemic control is improving. Patient is able to verbalize appropriate  hypoglycemia management plan. Medication adherence is appropriate. -Inc Lantus to 24 units as prescribed.  -Continue Novolog to 8 units BID BEFORE meals. -Extensively discussed pathophysiology of diabetes, recommended lifestyle interventions, dietary effects on blood sugar control -Counseled on s/sx of and management of hypoglycemia -Next A1C anticipated 04/2020.   Written patient instructions provided.  Total time in face to face counseling 30 minutes.   Follow up Pharmacist Clinic Visit in 2 weeks.    Butch Penny, PharmD, CPP Clinical Pharmacist [facility] & [facility] [phone]

## 2020-04-25 ENCOUNTER — Other Ambulatory Visit: Payer: Self-pay

## 2020-04-25 ENCOUNTER — Ambulatory Visit: Payer: Medicare Other | Attending: Internal Medicine | Admitting: Pharmacist

## 2020-04-25 ENCOUNTER — Encounter: Payer: Self-pay | Admitting: Pharmacist

## 2020-04-25 DIAGNOSIS — E1142 Type 2 diabetes mellitus with diabetic polyneuropathy: Secondary | ICD-10-CM | POA: Diagnosis not present

## 2020-04-25 DIAGNOSIS — Z794 Long term (current) use of insulin: Secondary | ICD-10-CM

## 2020-04-25 NOTE — Progress Notes (Signed)
    S:    PCP: Dr. Laural Benes   No chief complaint on file.  Patient arrives in good spirits. Presents for diabetes evaluation, education, and management. Patient was referred and last seen by Primary Care Provider on 02/13/2020. Pharmacy saw her on 03/28/20 and had her increase her insulin.   Family/Social History:  - Fhx: heart disease - Tobacco: never smoker  - Alcohol: denies use   Insurance coverage/medication affordability: Medicare/Medicaid   Medication adherence reported.  Current diabetes medications include: Humalog 8 units BID, Lantus 24 units daily  Current hypertension medications include: amlodipine 5 mg daily Current hyperlipidemia medications include:  None   Patient denies hypoglycemic events.  Patient reported dietary habits:  - she tries to eat healthy but is unable to describe a low carb diet - she denies eating sweets or drinking sweetened beverages   Patient-reported exercise habits:  - limited   Patient denies nocturia (nighttime urination).  Patient reports neuropathy (nerve pain). Patient denies visual changes. Patient reports self foot exams.     O:  Lab Results  Component Value Date   HGBA1C 8.5 (A) 02/13/2020   There were no vitals filed for this visit.  Lipid Panel     Component Value Date/Time   CHOL 129 05/30/2019 1431   TRIG 189 (H) 05/30/2019 1431   HDL 42 05/30/2019 1431   CHOLHDL 3.1 05/30/2019 1431   CHOLHDL 2.7 03/18/2019 0358   VLDL 13 03/18/2019 0358   LDLCALC 56 05/30/2019 1431    Home fasting blood sugars: 82 - 188. 1 outlier of 298 2 hour post-meal/random blood sugars: 176 - 294   Clinical Atherosclerotic Cardiovascular Disease (ASCVD): No  The ASCVD Risk score Denman George DC Jr., et al., 2013) failed to calculate for the following reasons:   The valid total cholesterol range is 130 to 320 mg/dL    A/P: Diabetes longstanding currently uncontrolled.  Patient is able to verbalize appropriate hypoglycemia management plan.  Medication adherence is appropriate. -Inc Lantus to 28 units.  -Continue Novolog to 8 units BID BEFORE meals. -Extensively discussed pathophysiology of diabetes, recommended lifestyle interventions, dietary effects on blood sugar control -Counseled on s/sx of and management of hypoglycemia -A1c due at the end of this month. Pt sees Dr. Laural Benes in Jan. Anticipate then.   Written patient instructions provided.  Total time in face to face counseling 30 minutes.   Follow up Pharmacist Clinic Visit in 2 weeks.    Butch Penny, PharmD, CPP Clinical Pharmacist [facility] & [facility] [phone]

## 2020-05-14 ENCOUNTER — Encounter (HOSPITAL_COMMUNITY): Payer: Self-pay

## 2020-05-14 ENCOUNTER — Emergency Department (HOSPITAL_COMMUNITY): Payer: Medicare Other

## 2020-05-14 ENCOUNTER — Emergency Department (HOSPITAL_COMMUNITY)
Admission: EM | Admit: 2020-05-14 | Discharge: 2020-05-14 | Disposition: A | Discharge status: Home / Self Care | Payer: Medicare Other | Attending: Emergency Medicine | Admitting: Emergency Medicine

## 2020-05-14 ENCOUNTER — Other Ambulatory Visit: Payer: Self-pay

## 2020-05-14 DIAGNOSIS — T25122A Burn of first degree of left foot, initial encounter: Secondary | ICD-10-CM | POA: Insufficient documentation

## 2020-05-14 DIAGNOSIS — Z794 Long term (current) use of insulin: Secondary | ICD-10-CM | POA: Diagnosis not present

## 2020-05-14 DIAGNOSIS — E119 Type 2 diabetes mellitus without complications: Secondary | ICD-10-CM | POA: Diagnosis not present

## 2020-05-14 DIAGNOSIS — T300 Burn of unspecified body region, unspecified degree: Secondary | ICD-10-CM

## 2020-05-14 DIAGNOSIS — I10 Essential (primary) hypertension: Secondary | ICD-10-CM | POA: Insufficient documentation

## 2020-05-14 DIAGNOSIS — Y92009 Unspecified place in unspecified non-institutional (private) residence as the place of occurrence of the external cause: Secondary | ICD-10-CM | POA: Insufficient documentation

## 2020-05-14 DIAGNOSIS — T25022A Burn of unspecified degree of left foot, initial encounter: Secondary | ICD-10-CM | POA: Diagnosis present

## 2020-05-14 DIAGNOSIS — Z79899 Other long term (current) drug therapy: Secondary | ICD-10-CM | POA: Insufficient documentation

## 2020-05-14 DIAGNOSIS — X16XXXA Contact with hot heating appliances, radiators and pipes, initial encounter: Secondary | ICD-10-CM | POA: Insufficient documentation

## 2020-05-14 LAB — COMPREHENSIVE METABOLIC PANEL
ALT: 29 U/L (ref 0–44)
AST: 23 U/L (ref 15–41)
Albumin: 3.8 g/dL (ref 3.5–5.0)
Alkaline Phosphatase: 59 U/L (ref 38–126)
Anion gap: 11 (ref 5–15)
BUN: 23 mg/dL (ref 8–23)
CO2: 28 mmol/L (ref 22–32)
Calcium: 10.4 mg/dL — ABNORMAL HIGH (ref 8.9–10.3)
Chloride: 95 mmol/L — ABNORMAL LOW (ref 98–111)
Creatinine, Ser: 0.99 mg/dL (ref 0.44–1.00)
GFR, Estimated: >60 mL/min (ref >60)
Glucose, Bld: 175 mg/dL — ABNORMAL HIGH (ref 70–99)
Potassium: 4.2 mmol/L (ref 3.5–5.1)
Sodium: 134 mmol/L — ABNORMAL LOW (ref 135–145)
Total Bilirubin: 0.7 mg/dL (ref 0.3–1.2)
Total Protein: 7.1 g/dL (ref 6.5–8.1)

## 2020-05-14 LAB — LACTIC ACID, PLASMA: Lactic Acid, Venous: 1.5 mmol/L (ref 0.5–1.9)

## 2020-05-14 LAB — CBC WITH DIFFERENTIAL/PLATELET
Abs Immature Granulocytes: 0.03 10*3/uL (ref 0.00–0.07)
Basophils Absolute: 0 10*3/uL (ref 0.0–0.1)
Basophils Relative: 0 %
Eosinophils Absolute: 0.1 10*3/uL (ref 0.0–0.5)
Eosinophils Relative: 2 %
HCT: 28.9 % — ABNORMAL LOW (ref 36.0–46.0)
Hemoglobin: 9.6 g/dL — ABNORMAL LOW (ref 12.0–15.0)
Immature Granulocytes: 1 %
Lymphocytes Relative: 18 %
Lymphs Abs: 1.2 10*3/uL (ref 0.7–4.0)
MCH: 21.8 pg — ABNORMAL LOW (ref 26.0–34.0)
MCHC: 33.2 g/dL (ref 30.0–36.0)
MCV: 65.5 fL — ABNORMAL LOW (ref 80.0–100.0)
Monocytes Absolute: 0.4 10*3/uL (ref 0.1–1.0)
Monocytes Relative: 6 %
Neutro Abs: 4.7 10*3/uL (ref 1.7–7.7)
Neutrophils Relative %: 73 %
Platelets: 108 10*3/uL — ABNORMAL LOW (ref 150–400)
RBC: 4.41 MIL/uL (ref 3.87–5.11)
RDW: 15.1 % (ref 11.5–15.5)
WBC: 6.5 10*3/uL (ref 4.0–10.5)
nRBC: 0 % (ref 0.0–0.2)

## 2020-05-14 MED ORDER — DOXYCYCLINE HYCLATE 100 MG PO CAPS: 100.0000 mg | ORAL_CAPSULE | Freq: Two times a day (BID) | ORAL | 0 refills | Status: AC

## 2020-05-14 NOTE — ED Notes (Signed)
Patient verbalizes understanding of discharge instructions. Opportunity for questioning and answers were provided. Armband removed by staff, pt discharged from ED via wheelchair.  
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
## 2020-05-14 NOTE — Discharge Instructions (Addendum)
I have put in and order for ambulatory referral to wound care.  They will reach out to you to schedule appointment for evaluation and intervention.  I have also prescribed you doxycycline, please take as directed.  I would like you to follow-up with your primary care provider regarding today's encounter and for ongoing evaluation.  You may benefit from outpatient MRI of your left foot if your symptoms fail to improve with conservative therapy to assess for possible osteomyelitis.  However, your work-up today and exam was reassuring.  Please continue to apply topical antibiotic ointment to the affected toe.  Please read the attachment on burn care.  Return to the ED or seek immediate medical attention should you experience any new or worsening symptoms.  Ti ? ??t v yu c?u chuy?n tuy?n c?u th??ng ??n ch?m [place] v?t th??ng. H? s? lin h? v?i b?n ?? ln l?ch h?n ?nh gi v can thi?p.  Ti c?ng ? k ??n cho b?n doxycycline, vui lng u?ng theo ch? d?n.  Ti mu?n b?n lin h? v?i nh cung c?p d?ch v? ch?m [place] chnh c?a b?n v? cu?c g?p g? hm nay v ?? ?nh gi lin t?c. B?n c th? ???c h??ng l?i t? vi?c ch?p MRI bn chn tri cho b?nh nhn ngo?i tr n?u cc tri?u ch?ng c?a b?n khng c?i thi?n v?i li?u php b?o t?n ?? ?nh gi kh? n?ng vim t?y x??ng. Tuy nhin, hm nay cng vi?c v k? thi c?a b?n ? khi?n b?n yn tm.  Hy ti?p t?c bi thu?c m? khng sinh t?i ch? cho ngn chn b? ?nh h??ng. Vui lng ??c ph?n ?nh km v? ch?m [place] v?t b?ng.  [facility] l?i ED ho?c tm ki?m s? ch?m  y t? ngay l?p t?c n?u b?n g?p b?t k? tri?u ch?ng m?i ho?c tr?m tr?ng h?n.

## 2020-05-28 ENCOUNTER — Other Ambulatory Visit: Payer: Self-pay | Admitting: Internal Medicine

## 2020-06-14 ENCOUNTER — Encounter: Payer: Self-pay | Admitting: Internal Medicine

## 2020-06-14 ENCOUNTER — Other Ambulatory Visit: Payer: Self-pay

## 2020-06-14 ENCOUNTER — Ambulatory Visit: Payer: Medicare Other | Attending: Internal Medicine | Admitting: Internal Medicine

## 2020-06-14 VITALS — BP 134/79 | HR 89 | Temp 98.3°F | Resp 16 | Wt 104.2 lb

## 2020-06-14 DIAGNOSIS — Z8611 Personal history of tuberculosis: Secondary | ICD-10-CM | POA: Diagnosis not present

## 2020-06-14 DIAGNOSIS — D61818 Other pancytopenia: Secondary | ICD-10-CM | POA: Diagnosis not present

## 2020-06-14 DIAGNOSIS — E222 Syndrome of inappropriate secretion of antidiuretic hormone: Secondary | ICD-10-CM | POA: Insufficient documentation

## 2020-06-14 DIAGNOSIS — E042 Nontoxic multinodular goiter: Secondary | ICD-10-CM | POA: Insufficient documentation

## 2020-06-14 DIAGNOSIS — Z79899 Other long term (current) drug therapy: Secondary | ICD-10-CM | POA: Diagnosis not present

## 2020-06-14 DIAGNOSIS — W292XXD Contact with other powered household machinery, subsequent encounter: Secondary | ICD-10-CM | POA: Diagnosis not present

## 2020-06-14 DIAGNOSIS — T25132S Burn of first degree of left toe(s) (nail), sequela: Secondary | ICD-10-CM

## 2020-06-14 DIAGNOSIS — T25032D Burn of unspecified degree of left toe(s) (nail), subsequent encounter: Secondary | ICD-10-CM | POA: Diagnosis not present

## 2020-06-14 DIAGNOSIS — M810 Age-related osteoporosis without current pathological fracture: Secondary | ICD-10-CM | POA: Insufficient documentation

## 2020-06-14 DIAGNOSIS — I10 Essential (primary) hypertension: Secondary | ICD-10-CM | POA: Diagnosis present

## 2020-06-14 DIAGNOSIS — D565 Hemoglobin E-beta thalassemia: Secondary | ICD-10-CM | POA: Insufficient documentation

## 2020-06-14 DIAGNOSIS — Z9189 Other specified personal risk factors, not elsewhere classified: Secondary | ICD-10-CM

## 2020-06-14 DIAGNOSIS — E1142 Type 2 diabetes mellitus with diabetic polyneuropathy: Secondary | ICD-10-CM

## 2020-06-14 DIAGNOSIS — Z794 Long term (current) use of insulin: Secondary | ICD-10-CM | POA: Insufficient documentation

## 2020-06-14 DIAGNOSIS — M5416 Radiculopathy, lumbar region: Secondary | ICD-10-CM | POA: Diagnosis not present

## 2020-06-14 DIAGNOSIS — D582 Other hemoglobinopathies: Secondary | ICD-10-CM

## 2020-06-14 DIAGNOSIS — E11649 Type 2 diabetes mellitus with hypoglycemia without coma: Secondary | ICD-10-CM | POA: Diagnosis not present

## 2020-06-14 LAB — POCT GLYCOSYLATED HEMOGLOBIN (HGB A1C): HbA1c, POC (controlled diabetic range): 5.5 % (ref 0.0–7.0)

## 2020-06-14 LAB — GLUCOSE, POCT (MANUAL RESULT ENTRY): POC Glucose: 259 mg/dl — AB (ref 70–99)

## 2020-06-14 MED ORDER — BD PEN NEEDLE MINI U/F 31G X 5 MM MISC: 2 refills | Status: DC

## 2020-06-14 NOTE — Patient Instructions (Signed)
Decrease Lantus insulin to 24 units daily.  Goal is to get blood sugars between 90-130.  Let me know if you have any blood sugars below 80 occurring more than once a week.
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
## 2020-06-27 ENCOUNTER — Ambulatory Visit (INDEPENDENT_AMBULATORY_CARE_PROVIDER_SITE_OTHER): Payer: Medicare Other | Admitting: Podiatry

## 2020-06-27 ENCOUNTER — Ambulatory Visit (INDEPENDENT_AMBULATORY_CARE_PROVIDER_SITE_OTHER): Payer: Medicare Other

## 2020-06-27 ENCOUNTER — Other Ambulatory Visit: Payer: Self-pay

## 2020-06-27 ENCOUNTER — Encounter: Payer: Self-pay | Admitting: Podiatry

## 2020-06-27 DIAGNOSIS — T300 Burn of unspecified body region, unspecified degree: Secondary | ICD-10-CM

## 2020-06-27 DIAGNOSIS — L97521 Non-pressure chronic ulcer of other part of left foot limited to breakdown of skin: Secondary | ICD-10-CM

## 2020-06-27 MED ORDER — SILVER SULFADIAZINE 1 % EX CREA: 1.0000 "application " | TOPICAL_CREAM | Freq: Every day | CUTANEOUS | 0 refills | Status: DC

## 2020-06-27 MED ORDER — DOXYCYCLINE HYCLATE 100 MG PO TABS: 100.0000 mg | ORAL_TABLET | Freq: Two times a day (BID) | ORAL | 0 refills | Status: DC
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
## 2020-07-18 ENCOUNTER — Other Ambulatory Visit: Payer: Self-pay

## 2020-07-18 ENCOUNTER — Ambulatory Visit (INDEPENDENT_AMBULATORY_CARE_PROVIDER_SITE_OTHER): Payer: Medicare Other | Admitting: Podiatry

## 2020-07-18 DIAGNOSIS — L97521 Non-pressure chronic ulcer of other part of left foot limited to breakdown of skin: Secondary | ICD-10-CM

## 2020-07-18 DIAGNOSIS — R609 Edema, unspecified: Secondary | ICD-10-CM

## 2020-07-22 NOTE — Progress Notes (Signed)
Subjective: 75 year old female presents the office with interpreter for follow-up evaluation of a wound on her left second toe.  She states the area is getting better.  Still gets some occasional swelling but has improved.  Denies any drainage.  No redness or red streaks and no open she reports currently. Denies any systemic complaints such as fevers, chills, nausea, vomiting. No acute changes since last appointment, and no other complaints at this time.   Objective: AAO x3, NAD DP/PT pulses palpable bilaterally, CRT less than 3 seconds There is still some mild residual edema to left second toe.  Appears the wound is healed there is no warmth or redness.  No ascending cellulitis but there is no fluctuation crepitation.  Overall it is improving. Hammertoes present.  No pain with calf compression, swelling, warmth, erythema  Assessment: Healed wound left Second toe with improved swelling  Plan: -All treatment options discussed with the patient including all alternatives, risks, complications.  -Overall she is making improvement.  Dispensed offloading pads.  Monitor for any reoccurrence of the wound.  The swelling is improving and discussed with likely take some time for it to completely resolve.  Discussed daily foot inspection. -Patient encouraged to call the office with any questions, concerns, change in symptoms.   Vivi Barrack DPM

## 2020-08-08 ENCOUNTER — Other Ambulatory Visit: Payer: Self-pay

## 2020-08-08 ENCOUNTER — Ambulatory Visit (INDEPENDENT_AMBULATORY_CARE_PROVIDER_SITE_OTHER): Payer: Medicare Other | Admitting: Podiatry

## 2020-08-08 DIAGNOSIS — R609 Edema, unspecified: Secondary | ICD-10-CM | POA: Diagnosis not present

## 2020-08-08 DIAGNOSIS — M2042 Other hammer toe(s) (acquired), left foot: Secondary | ICD-10-CM | POA: Diagnosis not present

## 2020-08-10 NOTE — Progress Notes (Signed)
Subjective: 75 year old female presents the office with interpreter for follow-up evaluation of a wound on her left second toe.  She cannot see any swelling or redness or any open sores or drainage.  She has no pain.  No injuries.  She is wearing regular shoes.  No other concerns.   Objective: AAO x3, NAD DP/PT pulses palpable bilaterally, CRT less than 3 seconds There is still some trace residual edema to left second toe.  Appears the wound is healed there is no warmth or redness.  There is no pain.  There is no erythema there is no warmth there is no open lesions.  Hammertoe contracture present. No pain with calf compression, swelling, warmth, erythema  Assessment: Healed wound left second toe with improved swelling  Plan: -All treatment options discussed with the patient including all alternatives, risks, complications.  -There is no open lesion on the toe has no signs of infection.  Appears to be resolved.  Dispensed offloading pads help further offload the toe and we also discussed shoe modifications.  Discussed daily foot inspection that there is any reoccurrence letter know.  Vivi Barrack DPM

## 2020-10-01 ENCOUNTER — Other Ambulatory Visit: Payer: Self-pay | Admitting: Internal Medicine

## 2020-10-01 DIAGNOSIS — E1142 Type 2 diabetes mellitus with diabetic polyneuropathy: Secondary | ICD-10-CM

## 2020-10-11 ENCOUNTER — Ambulatory Visit: Payer: Medicare Other | Attending: Internal Medicine | Admitting: Internal Medicine

## 2020-10-11 ENCOUNTER — Other Ambulatory Visit: Payer: Self-pay

## 2020-10-11 ENCOUNTER — Encounter: Payer: Self-pay | Admitting: Internal Medicine

## 2020-10-11 VITALS — BP 174/84 | HR 76 | Resp 16 | Wt 107.0 lb

## 2020-10-11 DIAGNOSIS — Z8249 Family history of ischemic heart disease and other diseases of the circulatory system: Secondary | ICD-10-CM | POA: Insufficient documentation

## 2020-10-11 DIAGNOSIS — Z79899 Other long term (current) drug therapy: Secondary | ICD-10-CM | POA: Insufficient documentation

## 2020-10-11 DIAGNOSIS — E1142 Type 2 diabetes mellitus with diabetic polyneuropathy: Secondary | ICD-10-CM

## 2020-10-11 DIAGNOSIS — I10 Essential (primary) hypertension: Secondary | ICD-10-CM | POA: Diagnosis present

## 2020-10-11 DIAGNOSIS — Z7901 Long term (current) use of anticoagulants: Secondary | ICD-10-CM | POA: Diagnosis not present

## 2020-10-11 DIAGNOSIS — Z794 Long term (current) use of insulin: Secondary | ICD-10-CM | POA: Diagnosis not present

## 2020-10-11 LAB — GLUCOSE, POCT (MANUAL RESULT ENTRY): POC Glucose: 231 mg/dl — AB (ref 70–99)

## 2020-10-11 MED ORDER — AMLODIPINE BESYLATE 10 MG PO TABS
10.0000 mg | ORAL_TABLET | Freq: Every day | ORAL | 1 refills | Status: DC
Start: 2020-10-11 — End: 2021-04-14

## 2020-10-11 NOTE — Progress Notes (Signed)
Patient ID: Olivia Williamson, female    DOB: 1945-12-16  MRN: [id number]  CC: Diabetes and Hypertension   Subjective: Olivia Williamson is a 75 y.o. female who presents for chronic ds management.  Daih from [facility] interprets.  pt's daughter is with her and gives a lot of the history.   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  Does not have log book with her A.m 80-200, before dinner 180 highest.  No lows. Compliant with Lantus 24 units in evenings and Novolog 8 units with breakfast and dinner Doing good with eating habits.  Daughter reports she is eating and sleeping well.  She gets outside to do some gardening during the week.   HTN:  Did not bring meds with her but daughter states she is taking all of her meds and took them already for the morning.  She is supposed to be on amlodipine 5 mg daily and Cozaar 100 mg daily.  No chest pains or shortness of breath.  No swelling in the legs.  Patient Active Problem List   Diagnosis Date Noted  . COVID-19 virus vaccination declined 02/13/2020  . Allergic rhinitis due to allergen 05/02/2019  . Periodontal disease 05/02/2019  . Chronic cough 04/13/2019  . Non-intractable vomiting   . Community acquired pneumonia 03/16/2019  . Positive for macroalbuminuria 02/05/2019  . 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 Lumbar radiculopathy 02/02/2017  . Chronic left-sided thoracic back pain 02/02/2017  . 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 Multinodular goiter 11/01/2016  . Thyroid nodule  04/13/2016  . History of tuberculosis 01/09/2016  . Generalized weakness 12/15/2015  . Pancytopenia (HCC) 12/15/2015  . Essential hypertension   . GERD (gastroesophageal reflux disease)   . Diabetes mellitus with neurological manifestations [facility])      Current Outpatient Medications on File Prior to Visit  Medication Sig Dispense Refill  . B-D UF III MINI PEN NEEDLES 31G X 5 MM MISC USE AS DIRECTED 100 each 2  . Cholecalciferol (VITAMIN D3) 10 MCG (400 UNIT) tablet TAKE 2 TABLETS BY MOUTH EVERY DAY 100 tablet 8  . [facility] CALCIUM 600 & VITAMIN D3 600-800 MG-UNIT TABS TAKE 1 TABLET (600 MG TOTAL) BY MOUTH 2 (TWO) TIMES DAILY WITH A MEAL. 180 tablet 1  . fluticasone (FLONASE) 50 MCG/ACT nasal spray Place 2 sprays into both nostrils daily. 16 g 6  . gabapentin (NEURONTIN) 300 MG capsule TAKE 2 CAPSULES (600 MG TOTAL) BY MOUTH AT BEDTIME. 60 capsule 6  . insulin aspart (NOVOLOG FLEXPEN) 100 UNIT/ML FlexPen 8 units with breakfast and dinner 15 mL 11  . insulin glargine (LANTUS SOLOSTAR) 100 UNIT/ML Solostar Pen Inject 24 Units into the skin at bedtime. 15 mL 6  . losartan (COZAAR) 100 MG tablet Take 1 tablet (100 mg total) by mouth daily. 30 tablet 6  . silver sulfADIAZINE (SILVADENE) 1 % cream Apply 1 application topically daily. 50 g 0   No current facility-administered medications on file prior to visit.    Allergies  Allergen Reactions  . [employer]  Allergy]     Sneezing and SOB  . Beef-Derived Products Other (See Comments)    Generalized burning sensation  . Fruit & Vegetable Daily [Nutritional Supplements] Other (See Comments)    Orange causes lower extremity burning  . Metformin And Related Diarrhea    Social History   Socioeconomic History  . Marital status: Widowed    Spouse name: Not on file  . Number of children: Not on file  . Years of education: Not on file  . Highest education level: Not on file  Occupational History  . Not on file  Tobacco Use  . Smoking  status: Never Smoker  . Smokeless tobacco: Never Used  Substance and Sexual Activity  . Alcohol use: No  . Drug use: No  . Sexual activity: Not on file  Other Topics Concern  . Not on file  Social History Narrative  . Not on file   Social Determinants of Health   Financial Resource Strain: Not on file  Food Insecurity: Not on file  Transportation Needs: Not on file  Physical Activity: Not on file  Stress: Not on file  Social Connections: Not on file  Intimate Partner Violence: Not on file    Family History  Problem Relation Age of Onset  . Heart disease Maternal Uncle 63    Past Surgical History:  Procedure Laterality Date  . ABDOMINAL HYSTERECTOMY    . CHOLECYSTECTOMY N/A 05/02/2014   Procedure: LAPAROSCOPIC CHOLECYSTECTOMY ;  Surgeon: Abigail Miyamoto, MD;  Location: [facility] OR;  Service: General;  Laterality: N/A;  laparoscopic cholecystectomy  . COLONOSCOPY    . NO PAST SURGERIES    . VIDEO BRONCHOSCOPY Bilateral 01/10/2016   Procedure: VIDEO BRONCHOSCOPY WITHOUT FLUORO;  Surgeon: Leslye Peer, MD;  Location[facility] ENDOSCOPY;  Service: Cardiopulmonary;  Laterality: Bilateral;    ROS: Review of Systems Negative except as stated above  PHYSICAL EXAM: BP (!) 174/84   Pulse 76   Resp 16   Wt 107 lb (48.5 kg)   LMP 05/11/2016   SpO2 98%   BMI 21.61 kg/m   Wt Readings from Last 3 Encounters:  10/11/20 107 lb (48.5 kg)  06/14/20 104 lb 3.2 oz (47.3 kg)  02/13/20 103 lb 3.2 oz (46.8 kg)  BP 200/81  Physical Exam  General appearance -frail elderly female in NAD Mental status -patient answers some questions appropriately.   Neck -neck is supple.   Chest - clear to auscultation, no wheezes, rales or rhonchi, symmetric air entry Heart - normal rate, regular rhythm, normal S1, S2, no murmurs, rubs, clicks or gallops Extremities - peripheral pulses normal, no pedal edema, no clubbing or cyanosis Diabetic Foot Exam - Simple   Simple Foot Form Visual Inspection No  deformities, no ulcerations, no other skin breakdown bilaterally: Yes Sensation Testing Intact to touch and monofilament testing bilaterally: Yes Pulse Check Posterior Tibialis and Dorsalis pulse intact bilaterally: Yes Comments      CMP Latest Ref Rng & Units 05/14/2020 08/31/2019 05/30/2019  Glucose 70 - 99 mg/dL 017(P) 102(H) 852(D)  BUN 8 - 23 mg/dL 23 78(E) 17  Creatinine 0.44 - 1.00 mg/dL 4.23 5.36(R) 4.43  Sodium 135 - 145 mmol/L 134(L) 134 135  Potassium 3.5 - 5.1 mmol/L 4.2 4.9 4.4  Chloride 98 - 111 mmol/L 95(L) 97 95(L)  CO2 22 - 32 mmol/L 28 23 29   Calcium 8.9 - 10.3 mg/dL 10.4(H) 9.7 9.9  Total Protein 6.5 - 8.1 g/dL 7.1 7.0 6.5  Total Bilirubin 0.3 -  1.2 mg/dL 0.7 0.5 0.7  Alkaline Phos 38 - 126 U/L 59 89 63  AST 15 - 41 U/L 23 22 28   ALT 0 - 44 U/L 29 33(H) 33(H)   Lipid Panel     Component Value Date/Time   CHOL 129 05/30/2019 1431   TRIG 189 (H) 05/30/2019 1431   HDL 42 05/30/2019 1431   CHOLHDL 3.1 05/30/2019 1431   CHOLHDL 2.7 03/18/2019 0358   VLDL 13 03/18/2019 0358   LDLCALC 56 05/30/2019 1431    CBC    Component Value Date/Time   WBC 6.5 05/14/2020 1428   RBC 4.41 05/14/2020 1428   HGB 9.6 (L) 05/14/2020 1428   HGB 8.2 (L) 08/31/2019 1143   HCT 28.9 (L) 05/14/2020 1428   HCT 27.5 (L) 08/31/2019 1143   PLT 108 (L) 05/14/2020 1428   PLT 89 (LL) 08/31/2019 1143   MCV 65.5 (L) 05/14/2020 1428   MCV 73 (L) 08/31/2019 1143   MCH 21.8 (L) 05/14/2020 1428   MCHC 33.2 05/14/2020 1428   RDW 15.1 05/14/2020 1428   RDW 16.6 (H) 08/31/2019 1143   LYMPHSABS 1.2 05/14/2020 1428   LYMPHSABS 0.9 05/30/2019 1432   MONOABS 0.4 05/14/2020 1428   EOSABS 0.1 05/14/2020 1428   EOSABS 0.1 05/30/2019 1432   BASOSABS 0.0 05/14/2020 1428   BASOSABS 0.0 05/30/2019 1432    ASSESSMENT AND PLAN: 1. Type 2 diabetes mellitus with peripheral neuropathy (HCC) A1c is up to two-point since last visit but still acceptable. Asked that she brings a log with blood sugar  readings with her on next visit. Continue current dose of Lantus and NovoLog insulins. - POCT glucose (manual entry) - Lipid panel; Future  2. Essential hypertension Not at goal.  I recommend increasing the amlodipine to 10 mg daily.  Updated prescription sent to her pharmacy.  Continue Cozaar 100 mg daily.  Follow-up with clinical pharmacist in 2 weeks for repeat blood pressure check. - amLODipine (NORVASC) 10 MG tablet; Take 1 tablet (10 mg total) by mouth daily.  Dispense: 90 tablet; Refill: 1 - Comprehensive metabolic panel; Future - CBC; Future    Patient was given the opportunity to ask questions.  Patient verbalized understanding of the plan and was able to repeat key elements of the plan.   Orders Placed This Encounter  Procedures  . Comprehensive metabolic panel  . CBC  . Lipid panel  . POCT glucose (manual entry)     Requested Prescriptions   Signed Prescriptions Disp Refills  . amLODipine (NORVASC) 10 MG tablet 90 tablet 1    Sig: Take 1 tablet (10 mg total) by mouth daily.    Return in about 4 months (around 02/11/2021) for Give appt with [facility] in 2 wks for BP check.  [facility], MD, FACP

## 2020-10-15 LAB — POCT GLYCOSYLATED HEMOGLOBIN (HGB A1C): HbA1c, POC (controlled diabetic range): 7.1 % — AB (ref 0.0–7.0)

## 2020-10-29 ENCOUNTER — Other Ambulatory Visit: Payer: Self-pay

## 2020-10-29 ENCOUNTER — Ambulatory Visit: Payer: Medicare Other | Attending: Internal Medicine | Admitting: Pharmacist

## 2020-10-29 DIAGNOSIS — E1142 Type 2 diabetes mellitus with diabetic polyneuropathy: Secondary | ICD-10-CM

## 2020-10-29 DIAGNOSIS — I10 Essential (primary) hypertension: Secondary | ICD-10-CM

## 2020-10-29 MED ORDER — LOSARTAN POTASSIUM 25 MG PO TABS: 25.0000 mg | ORAL_TABLET | Freq: Every day | ORAL | 1 refills | Status: DC

## 2020-10-29 NOTE — Progress Notes (Signed)
   S:    Patient arrives well and in good spirits. Accompanied by her daughter and a Furniture conservator/restorer. Presents to the clinic for hypertension evaluation, counseling, and management. Patient seen by Dr. Laural Benes on 10/11/20. BP elevated at 174/84 with P of 76. Amlodipine was increased to 10 mg daily, continued losartan 100 mg.   Today, patient presents with medication bottles. Reports great adherence to amlodipine 10 mg. However, does not have losartan 100 mg with her and does not recall taking this medication. BP relatively controlled today. Denies dizziness. Occasional headache.   Current BP Medications include:  amlodipine 10 mg once daily, losartan 100 mg once daily (not taking)   Family / Social history: non-smoker  O:  Vitals:   10/29/20 1651  BP: 134/76  Pulse: 72   Last 3 Office BP readings: BP Readings from Last 3 Encounters:  10/29/20 134/76  10/11/20 (!) 174/84  06/14/20 134/79    BMET    Component Value Date/Time   NA 134 (L) 05/14/2020 1428   NA 134 08/31/2019 1143   K 4.2 05/14/2020 1428   CL 95 (L) 05/14/2020 1428   CO2 28 05/14/2020 1428   GLUCOSE 175 (H) 05/14/2020 1428   BUN 23 05/14/2020 1428   BUN 34 (H) 08/31/2019 1143   CREATININE 0.99 05/14/2020 1428   CREATININE 0.66 06/19/2016 0838   CALCIUM 10.4 (H) 05/14/2020 1428   GFRNONAA >60 05/14/2020 1428   GFRNONAA >89 12/30/2015 1210   GFRAA 61 08/31/2019 1143   GFRAA >89 12/30/2015 1210    Renal function: CrCl cannot be calculated (Patient's most recent lab result is older than the maximum 21 days allowed.).  Clinical ASCVD: No  The ASCVD Risk score Denman George DC Jr., et al., 2013) failed to calculate for the following reasons:   The valid total cholesterol range is 130 to 320 mg/dL    A/P: Hypertension longstanding, close to being at goal on current medications. BP Goal = < 130/80 mmHg. Medication adherence reported. Patient tolerating amlodipine 10 mg well. Losartan was not present with pill  bottles today. Patient and daughter were not able to recall being on losartan or recognize losartan pill. Although BP relatively controlled today on monotherapy, going to restart losartan at low dose of 25 mg for further BP lowering and kidney protection given T2DM.  -Restarted losartan at 25 mg once daily. -Continued amlodipine 10 mg daily.   -F/u labs ordered - CMP, CBC, lipid panel  -Counseled on lifestyle modifications for blood pressure control including reduced dietary sodium, increased exercise, adequate sleep.  Results reviewed and written information provided.   Total time in face-to-face counseling 15 minutes.   F/U Clinic Visit with pharmacy in 1 month.  Patient seen with Vash Quezada.  Theodis Sato, PharmD PGY-1 [facility] Pharmacy Resident   10/29/2020 4:51 PM

## 2020-10-30 ENCOUNTER — Telehealth: Payer: Self-pay | Admitting: Internal Medicine

## 2020-10-30 LAB — COMPREHENSIVE METABOLIC PANEL
ALT: 28 IU/L (ref 0–32)
AST: 21 IU/L (ref 0–40)
Albumin/Globulin Ratio: 1.3 (ref 1.2–2.2)
Albumin: 4.2 g/dL (ref 3.7–4.7)
Alkaline Phosphatase: 75 IU/L (ref 44–121)
BUN/Creatinine Ratio: 27 (ref 12–28)
BUN: 29 mg/dL — ABNORMAL HIGH (ref 8–27)
Bilirubin Total: 0.5 mg/dL (ref 0.0–1.2)
CO2: 24 mmol/L (ref 20–29)
Calcium: 9.9 mg/dL (ref 8.7–10.3)
Chloride: 100 mmol/L (ref 96–106)
Creatinine, Ser: 1.06 mg/dL — ABNORMAL HIGH (ref 0.57–1.00)
Globulin, Total: 3.2 g/dL (ref 1.5–4.5)
Glucose: 131 mg/dL — ABNORMAL HIGH (ref 65–99)
Potassium: 4.7 mmol/L (ref 3.5–5.2)
Sodium: 136 mmol/L (ref 134–144)
Total Protein: 7.4 g/dL (ref 6.0–8.5)
eGFR: 56 mL/min/{1.73_m2} — ABNORMAL LOW (ref >59)

## 2020-10-30 LAB — LIPID PANEL
Chol/HDL Ratio: 3.1 ratio (ref 0.0–4.4)
Cholesterol, Total: 133 mg/dL (ref 100–199)
HDL: 43 mg/dL (ref >39)
LDL Chol Calc (NIH): 63 mg/dL (ref 0–99)
Triglycerides: 160 mg/dL — ABNORMAL HIGH (ref 0–149)
VLDL Cholesterol Cal: 27 mg/dL (ref 5–40)

## 2020-10-30 LAB — CBC
Hematocrit: 25.9 % — ABNORMAL LOW (ref 34.0–46.6)
Hemoglobin: 8 g/dL — ABNORMAL LOW (ref 11.1–15.9)
MCH: 21.4 pg — ABNORMAL LOW (ref 26.6–33.0)
MCHC: 30.9 g/dL — ABNORMAL LOW (ref 31.5–35.7)
MCV: 69 fL — ABNORMAL LOW (ref 79–97)
Platelets: 100 10*3/uL — CL (ref 150–450)
RBC: 3.73 x10E6/uL — ABNORMAL LOW (ref 3.77–5.28)
RDW: 16 % — ABNORMAL HIGH (ref 11.7–15.4)
WBC: 4.7 10*3/uL (ref 3.4–10.8)

## 2020-10-30 NOTE — Telephone Encounter (Signed)
Phone call placed to patient today to discuss recent lab results.  Patient has several phone numbers listed on the chart.  The first mobile number that is listed as preferred was called.  Nobody answered.  Did not leave a message.  The second phone number listed as home phone number again nobody answered.  Third mobile phone number listed has a voicemail box that is not set up yet.  I did call [employer] spoke with Cira Rue [phone] who informed me that they do not have a [place] interpreter available at this time but I can set up an appointment for one of them to call me back.  He placed me on a brief hold and then came back and said that they do not have any [place] interpreter's on schedule to set up an appointment.  I will send patient a lab letter.  She has worsening anemia and chronic thrombocytopenia.  I have tried referring her to the hematologist several times in the past unsuccessfully.  I will send a lab letter informing her of the abnormal lab results and that I would like to refer her to a hematologist and request that a member of her family called back to confirm receipt of the letter and that they are okay with the referral.  Request a reliable # where they can be reached to [email or web address]

## 2020-11-24 ENCOUNTER — Other Ambulatory Visit: Payer: Self-pay | Admitting: Internal Medicine

## 2020-11-24 NOTE — Telephone Encounter (Signed)
last RF 10/29/20 #30 1 RF

## 2020-11-27 ENCOUNTER — Other Ambulatory Visit: Payer: Self-pay | Admitting: Internal Medicine

## 2020-11-28 ENCOUNTER — Telehealth: Payer: Self-pay | Admitting: Pharmacist

## 2020-11-28 ENCOUNTER — Other Ambulatory Visit: Payer: Self-pay

## 2020-11-28 ENCOUNTER — Ambulatory Visit: Payer: Medicare Other | Attending: Internal Medicine | Admitting: Pharmacist

## 2020-11-28 ENCOUNTER — Encounter: Payer: Self-pay | Admitting: Pharmacist

## 2020-11-28 VITALS — BP 116/64

## 2020-11-28 DIAGNOSIS — E1142 Type 2 diabetes mellitus with diabetic polyneuropathy: Secondary | ICD-10-CM

## 2020-11-28 DIAGNOSIS — I10 Essential (primary) hypertension: Secondary | ICD-10-CM | POA: Diagnosis not present

## 2020-11-28 MED ORDER — BD PEN NEEDLE MINI U/F 31G X 5 MM MISC: 2 refills | Status: DC

## 2020-11-28 MED ORDER — LOSARTAN POTASSIUM 25 MG PO TABS: 12.5000 mg | ORAL_TABLET | Freq: Every day | ORAL | 1 refills | Status: DC

## 2020-11-28 NOTE — Progress Notes (Signed)
   S:    PCP: Dr. Laural Benes  Patient arrives well and in good spirits. Accompanied by her daughter and a Furniture conservator/restorer. Presents to the clinic for hypertension evaluation, counseling, and management. Patient seen by Dr. Laural Benes on 10/11/20.   Today, patient presents with medication bottles. Reports great adherence to amlodipine 10 mg and losartan 25 mg daily. However, she endorses dizziness, occasional headache.   Current BP Medications include:  amlodipine 10 mg once daily, losartan 25 mg daily   Family / Social history: non-smoker  O:  Vitals:   11/28/20 1655  BP: 116/64    Last 3 Office BP readings: BP Readings from Last 3 Encounters:  11/28/20 116/64  10/29/20 134/76  10/11/20 (!) 174/84    BMET    Component Value Date/Time   NA 136 10/29/2020 1621   K 4.7 10/29/2020 1621   CL 100 10/29/2020 1621   CO2 24 10/29/2020 1621   GLUCOSE 131 (H) 10/29/2020 1621   GLUCOSE 175 (H) 05/14/2020 1428   BUN 29 (H) 10/29/2020 1621   CREATININE 1.06 (H) 10/29/2020 1621   CREATININE 0.66 06/19/2016 0838   CALCIUM 9.9 10/29/2020 1621   GFRNONAA >60 05/14/2020 1428   GFRNONAA >89 12/30/2015 1210   GFRAA 61 08/31/2019 1143   GFRAA >89 12/30/2015 1210    Renal function: CrCl cannot be calculated (Patient's most recent lab result is older than the maximum 21 days allowed.).  Clinical ASCVD: No  The 10-year ASCVD risk score Denman George DC Jr., et al., 2013) is: 22.2%   Values used to calculate the score:     Age: 75 years     Sex: Female     Is Non-Hispanic African American: No     Diabetic: Yes     Tobacco smoker: No     Systolic Blood Pressure: 116 mmHg     Is BP treated: Yes     HDL Cholesterol: 43 mg/dL     Total Cholesterol: 133 mg/dL    A/P: Hypertension longstanding currently at goal. DBP borderline low. Medication adherence reported. She does endorse dizziness since addition of losartan 25mg  daily to her regimen. I have instructed her to split this in half and  take 1/2 of a tablet (12.5 mg) daily. BP Goal = < 130/80 mmHg.  -Reduce dose of losartan to 1/2 of a tablet (12.5 mg) daily.   -Continued amlodipine 10 mg daily.  -Counseled on lifestyle modifications for blood pressure control including reduced dietary sodium, increased exercise, adequate sleep.  Results reviewed and written information provided.   Total time in face-to-face counseling 15 minutes.   F/U Clinic Visit with PCP in September.    October, PharmD, Butch Penny, CPP Clinical Pharmacist [facility] [phone]

## 2020-11-28 NOTE — Telephone Encounter (Signed)
Saw pt today for BP check. Her daughter is with her and gives [phone] as the best number to contact. I know you had tried and was unsuccessful at reaching her to set up an appt with a Hematologist.

## 2021-01-19 ENCOUNTER — Other Ambulatory Visit: Payer: Self-pay | Admitting: Critical Care Medicine

## 2021-01-19 NOTE — Telephone Encounter (Signed)
Requested Prescriptions  Pending Prescriptions Disp Refills  . fluticasone (FLONASE) 50 MCG/ACT nasal spray [Pharmacy Med Name: FLUTICASONE PROP 50 MCG SPRAY] 16 mL 2    Sig: SPRAY 2 SPRAYS INTO EACH NOSTRIL EVERY DAY     Ear, Nose, and Throat: Nasal Preparations - Corticosteroids Passed - 01/19/2021 10:15 AM      Passed - Valid encounter within last 12 months    Recent Outpatient Visits          1 month ago Essential hypertension   [facility] [place], Jeannett Senior L, RPH-CPP   2 months ago Type 2 diabetes mellitus with peripheral neuropathy [facility])   [place] [facility] [place], Jeannett Senior L, RPH-CPP   3 months ago Type 2 diabetes mellitus with peripheral neuropathy [facility])   [place] [facility] Jonah Blue B, MD   7 months ago Type 2 diabetes mellitus with peripheral neuropathy [facility])   [place] [facility] Jonah Blue B, MD   8 months ago Type 2 diabetes mellitus with diabetic polyneuropathy, with long-term current use of insulin [facility])   [place] [facility] Lois Huxley, Cornelius Moras, RPH-CPP      Future Appointments            In 3 weeks Laural Benes Binnie Rail, MD [facility]

## 2021-02-11 ENCOUNTER — Other Ambulatory Visit: Payer: Self-pay

## 2021-02-11 ENCOUNTER — Ambulatory Visit: Payer: Medicare Other | Attending: Internal Medicine | Admitting: Internal Medicine

## 2021-02-11 VITALS — BP 186/100 | HR 75 | Resp 16 | Wt 90.8 lb

## 2021-02-11 DIAGNOSIS — Z794 Long term (current) use of insulin: Secondary | ICD-10-CM | POA: Insufficient documentation

## 2021-02-11 DIAGNOSIS — Z79899 Other long term (current) drug therapy: Secondary | ICD-10-CM | POA: Insufficient documentation

## 2021-02-11 DIAGNOSIS — E1142 Type 2 diabetes mellitus with diabetic polyneuropathy: Secondary | ICD-10-CM | POA: Diagnosis not present

## 2021-02-11 DIAGNOSIS — Z9049 Acquired absence of other specified parts of digestive tract: Secondary | ICD-10-CM | POA: Insufficient documentation

## 2021-02-11 DIAGNOSIS — E1165 Type 2 diabetes mellitus with hyperglycemia: Secondary | ICD-10-CM | POA: Insufficient documentation

## 2021-02-11 DIAGNOSIS — I10 Essential (primary) hypertension: Secondary | ICD-10-CM | POA: Diagnosis not present

## 2021-02-11 DIAGNOSIS — Z8249 Family history of ischemic heart disease and other diseases of the circulatory system: Secondary | ICD-10-CM | POA: Insufficient documentation

## 2021-02-11 DIAGNOSIS — Z713 Dietary counseling and surveillance: Secondary | ICD-10-CM | POA: Insufficient documentation

## 2021-02-11 LAB — POCT URINALYSIS DIP (CLINITEK)
Bilirubin, UA: NEGATIVE
Glucose, UA: >=1000 mg/dL — AB
Ketones, POC UA: NEGATIVE mg/dL
Leukocytes, UA: NEGATIVE
Nitrite, UA: NEGATIVE
POC PROTEIN,UA: NEGATIVE
Spec Grav, UA: 1.01 (ref 1.010–1.025)
Urobilinogen, UA: 0.2 E.U./dL
pH, UA: 5.5 (ref 5.0–8.0)

## 2021-02-11 LAB — POCT GLYCOSYLATED HEMOGLOBIN (HGB A1C): HbA1c POC (<> result, manual entry): >15 % (ref 4.0–5.6)

## 2021-02-11 LAB — GLUCOSE, POCT (MANUAL RESULT ENTRY)

## 2021-02-11 MED ORDER — LOSARTAN POTASSIUM 25 MG PO TABS: 12.5000 mg | ORAL_TABLET | Freq: Every day | ORAL | 1 refills | Status: DC

## 2021-02-11 NOTE — Patient Instructions (Addendum)
Increase Lantus insulin (grey pen) to 32 units daily.  Increase Novolog insulin (blue pen) to 12 units with breakfast and dinner.  Recheck your your blood sugar when you get home today.  If your machine still reads as HIGH, please go to the emergency room.

## 2021-02-11 NOTE — Progress Notes (Signed)
Patient ID: Olivia Williamson, female    DOB: Sep 13, 1945  MRN: [id number]  CC: Diabetes and Hypertension   Subjective: Olivia Williamson is a 75 y.o. female who presents for chronic ds management.  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  She has 2 bottles of Norvasc, 1 bottle of vitamin D and 1 bottle of calcium 600/20 MCG, Lantus and NovoLog pens with her  DM: Results for orders placed or performed in visit on 02/11/21  POCT glucose (manual entry)  Result Value Ref Range   POC Glucose    POCT glycosylated hemoglobin (Hb A1C)  Result Value Ref Range   Hemoglobin A1C     HbA1c POC (<> result, manual entry) >15.0 4.0 - 5.6 %   HbA1c, POC (prediabetic range)     HbA1c, POC (controlled diabetic range)    POCT URINALYSIS DIP (CLINITEK)  Result Value Ref Range   Color, UA yellow yellow   Clarity, UA clear clear   Glucose, UA >=1,000 (A) negative mg/dL   Bilirubin, UA negative negative   Ketones, POC UA negative negative mg/dL   Spec Grav, UA 4.268 3.419 - 1.025   Blood, UA trace-intact (A) negative   pH, UA 5.5 5.0 - 8.0   POC PROTEIN,UA negative negative, trace   Urobilinogen, UA 0.2 0.2 or 1.0 E.U./dL   Nitrite, UA Negative Negative   Leukocytes, UA Negative Negative  POCT glucose (manual entry)  Result Value Ref Range   POC Glucose     Patient's blood sugar registering as high on our meter indicating that the level is probably around 600.  Her A1c is greater than 15.  She endorses frequent thirst and urination.  No blurred vision.  She tells me that she eats a lot of fruits during the day including oranges.  She tells me that she is taking  Lantus 28 units (supposed to be 24 units) and NovoLog 8 units with breakfast and dinner.  Her daughter-in-law gives her her shots.  She endorses consistency with getting her insulin.  She checks blood sugars twice a day but does not recall her readings on did not bring a log or her glucometer with her.  HTN: Blood pressure also elevated today.  She is supposed to be on Norvasc and Cozaar.  However she does not have a bottle of Cozaar with her and does not recall the medication.  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 90 tablet 1   Cholecalciferol (VITAMIN D3) 10 MCG (400 UNIT) tablet TAKE 2 TABLETS BY MOUTH EVERY DAY 100 tablet 8   CVS CALCIUM 600 & VITAMIN D3 600-800 MG-UNIT TABS TAKE 1 TABLET (600 MG TOTAL) BY MOUTH 2 (TWO) TIMES DAILY WITH A MEAL. 180 tablet 1   fluticasone (FLONASE) 50 MCG/ACT nasal spray SPRAY 2 SPRAYS INTO EACH NOSTRIL EVERY DAY 16 mL 2   gabapentin (NEURONTIN) 300 MG capsule TAKE 2 CAPSULES (600 MG TOTAL) BY MOUTH AT  BEDTIME. 60 capsule 6   insulin aspart (NOVOLOG FLEXPEN) 100 UNIT/ML FlexPen 8 units with breakfast and dinner 15 mL 11   insulin glargine (LANTUS SOLOSTAR) 100 UNIT/ML Solostar Pen Inject 24 Units into the skin at bedtime. 15 mL 6   Insulin Pen Needle (B-D UF III MINI PEN NEEDLES) 31G X 5 MM MISC Use to inject Lantus once a day and Novolog twice daily. (Total of 3 injections daily) 100 each 2   silver sulfADIAZINE (SILVADENE) 1 % cream Apply 1 application topically daily. 50 g 0   No current facility-administered medications on file prior to visit.    Allergies  Allergen Reactions   Shrimp [Shellfish Allergy]     Sneezing and SOB   Beef-Derived Products Other (See Comments)    Generalized burning sensation   Fruit & Vegetable Daily [Nutritional Supplements] Other (See Comments)    Orange causes lower extremity burning   Metformin And Related Diarrhea    Social History   Socioeconomic History   Marital status: Widowed    Spouse name: Not on file   Number of children: Not on file   Years of education: Not on file   Highest education level: Not on file  Occupational History   Not on file  Tobacco Use   Smoking status: Never   Smokeless tobacco: Never  Substance and Sexual Activity   Alcohol use: No   Drug use: No   Sexual activity: Not on file  Other Topics Concern   Not on file  Social History Narrative   Not on file   Social Determinants of Health   Financial Resource Strain: Not on file  Food Insecurity: Not on file  Transportation Needs: Not on file  Physical Activity: Not on file  Stress: Not on file  Social Connections: Not on file  Intimate Partner Violence: Not on file    Family History  Problem Relation Age of Onset   Heart disease Maternal Uncle 91    Past Surgical History:  Procedure Laterality Date   ABDOMINAL HYSTERECTOMY     CHOLECYSTECTOMY N/A 05/02/2014   Procedure: LAPAROSCOPIC CHOLECYSTECTOMY ;  Surgeon: Abigail Miyamoto, MD;  Location: [facility]  OR;  Service: General;  Laterality: N/A;  laparoscopic cholecystectomy   COLONOSCOPY     NO PAST SURGERIES     VIDEO BRONCHOSCOPY Bilateral 01/10/2016   Procedure: VIDEO BRONCHOSCOPY WITHOUT FLUORO;  Surgeon: Leslye Peer, MD;  Location: [facility] ENDOSCOPY;  Service: Cardiopulmonary;  Laterality: Bilateral;    ROS: Review of Systems Negative except as stated above  PHYSICAL EXAM:   Physical Exam  General appearance -elderly patient who appears frail in NAD.  Clothing appears clean. Mental status -patient is a poor historian.  She is forgetful. Mouth - mucous membranes moist, pharynx normal without lesions Chest - clear to auscultation, no  wheezes, rales or rhonchi, symmetric air entry Heart - normal rate, regular rhythm, normal S1, S2, no murmurs, rubs, clicks or gallops Extremities - peripheral pulses normal, no pedal edema, no clubbing or cyanosis   CMP Latest Ref Rng & Units 10/29/2020 05/14/2020 08/31/2019  Glucose 65 - 99 mg/dL 924(Q) 683(M) 196(Q)  BUN 8 - 27 mg/dL 22(L) 23 79(G)  Creatinine 0.57 - 1.00 mg/dL 9.21(J) 9.41 7.40(C)  Sodium 134 - 144 mmol/L 136 134(L) 134  Potassium 3.5 - 5.2 mmol/L 4.7 4.2 4.9  Chloride 96 - 106 mmol/L 100 95(L) 97  CO2 20 - 29 mmol/L 24 28 23   Calcium 8.7 - 10.3 mg/dL 9.9 10.4(H) 9.7  Total Protein 6.0 - 8.5 g/dL 7.4 7.1 7.0  Total Bilirubin 0.0 - 1.2 mg/dL 0.5 0.7 0.5  Alkaline Phos 44 - 121 IU/L 75 59 89  AST 0 - 40 IU/L 21 23 22   ALT 0 - 32 IU/L 28 29 33(H)   Lipid Panel     Component Value Date/Time   CHOL 133 10/29/2020 1621   TRIG 160 (H) 10/29/2020 1621   HDL 43 10/29/2020 1621   CHOLHDL 3.1 10/29/2020 1621   CHOLHDL 2.7 03/18/2019 0358   VLDL 13 03/18/2019 0358   LDLCALC 63 10/29/2020 1621    CBC    Component Value Date/Time   WBC 4.7 10/29/2020 1621   WBC 6.5 05/14/2020 1428   RBC 3.73 (L) 10/29/2020 1621   RBC 4.41 05/14/2020 1428   HGB 8.0 (L) 10/29/2020 1621   HCT 25.9 (L) 10/29/2020 1621   PLT 100 (LL) 10/29/2020  1621   MCV 69 (L) 10/29/2020 1621   MCH 21.4 (L) 10/29/2020 1621   MCH 21.8 (L) 05/14/2020 1428   MCHC 30.9 (L) 10/29/2020 1621   MCHC 33.2 05/14/2020 1428   RDW 16.0 (H) 10/29/2020 1621   LYMPHSABS 1.2 05/14/2020 1428   LYMPHSABS 0.9 05/30/2019 1432   MONOABS 0.4 05/14/2020 1428   EOSABS 0.1 05/14/2020 1428   EOSABS 0.1 05/30/2019 1432   BASOSABS 0.0 05/14/2020 1428   BASOSABS 0.0 05/30/2019 1432    ASSESSMENT AND PLAN:  1. Type 2 diabetes mellitus with hyperglycemia, with long-term current use of insulin (HCC) Patient's diabetes is significantly uncontrolled.  Dietary counseling given.  Advised eating fruits that have less sugar like apples berries and strawberries.  Went over portion size for bananas and grapes. Patient given 12 units of NovoLog and several bottles of water.  Blood sugar rechecked in about 25 minutes and was still reading high.  At that point, I gave patient and son 2 options: Proceed to the emergency room to receive intravenous insulin and fluids (this is what I preferred ) versus we could give an additional 8 units of NovoLog and have her recheck her blood sugar when she returns home.  If reading on her machine is high, she should be seen in the emergency room.  They preferred that we give the additional 8 units of NovoLog.  This required Advanced Beneficiary Notification that this may not be covered by Medicare and cost would be $9.  Patient and son was agreeable to this and signed the form. I recommend increasing Lantus insulin to 32 units daily. Increase NovoLog to 12 units with breakfast and dinner. Follow-up with clinical pharmacist in 1 week for recheck.  Advised patient to continue to check blood sugars twice a day, write them down and bring her log with her when she comes to see the clinical pharmacist in  1 week. - POCT glucose (manual entry) - POCT glycosylated hemoglobin (Hb A1C) - POCT URINALYSIS DIP (CLINITEK) - Basic Metabolic Panel; Future - POCT  glucose (manual entry) - Basic Metabolic Panel  2. Essential hypertension Not at goal.  Continue amlodipine.  Refill on Cozaar sent to her pharmacy. - Basic Metabolic Panel; Future - losartan (COZAAR) 25 MG tablet; Take 0.5 tablets (12.5 mg total) by mouth daily.  Dispense: 45 tablet; Refill: 1 - Basic Metabolic Panel   Patient was given the opportunity to ask questions.  Patient verbalized understanding of the plan and was able to repeat key elements of the plan.   Orders Placed This Encounter  Procedures   Basic Metabolic Panel   POCT glucose (manual entry)   POCT glycosylated hemoglobin (Hb A1C)   POCT URINALYSIS DIP (CLINITEK)   POCT glucose (manual entry)      Requested Prescriptions   Signed Prescriptions Disp Refills   losartan (COZAAR) 25 MG tablet 45 tablet 1    Sig: Take 0.5 tablets (12.5 mg total) by mouth daily.    Return in about 1 month (around 03/13/2021) for Give appt with [facility] in 1 week for recheck diabetes. Jonah Blue, MD, FACP

## 2021-02-13 ENCOUNTER — Telehealth: Payer: Self-pay | Admitting: Internal Medicine

## 2021-02-13 LAB — BASIC METABOLIC PANEL
BUN/Creatinine Ratio: 17 (ref 12–28)
BUN: 17 mg/dL (ref 8–27)
CO2: 25 mmol/L (ref 20–29)
Calcium: 10.5 mg/dL — ABNORMAL HIGH (ref 8.7–10.3)
Chloride: 93 mmol/L — ABNORMAL LOW (ref 96–106)
Creatinine, Ser: 0.98 mg/dL (ref 0.57–1.00)
Glucose: 61 mg/dL — ABNORMAL LOW (ref 65–99)
Potassium: 3.8 mmol/L (ref 3.5–5.2)
Sodium: 133 mmol/L — ABNORMAL LOW (ref 134–144)
eGFR: 61 mL/min/{1.73_m2} (ref >59)

## 2021-02-13 MED ORDER — INSULIN ASPART 100 UNIT/ML IJ SOLN: 12.0000 [IU] | Freq: Once | INTRAMUSCULAR | Status: DC

## 2021-02-13 NOTE — Telephone Encounter (Signed)
Phone call placed to patient this morning to discuss lab results.  Blood sugar was noted to be in the 60s on chemistry done yesterday.  There was no answer on the preferred cell phone number.  I left a message informing of who I am and requesting a call back.  I also tried calling the home phone number.  There was no answer.  I got the voicemail but did not leave a message.

## 2021-02-17 ENCOUNTER — Telehealth: Payer: Self-pay

## 2021-02-17 NOTE — Telephone Encounter (Signed)
Contacted pt to go over lab results pt didn't answer lvm   Sent a CRM and forward labs to NT to give pt labs when they call back   

## 2021-02-19 ENCOUNTER — Ambulatory Visit: Payer: Medicare Other | Admitting: Pharmacist

## 2021-03-07 ENCOUNTER — Other Ambulatory Visit: Payer: Self-pay | Admitting: Internal Medicine

## 2021-03-07 DIAGNOSIS — E1142 Type 2 diabetes mellitus with diabetic polyneuropathy: Secondary | ICD-10-CM

## 2021-03-08 NOTE — Telephone Encounter (Signed)
Requested Prescriptions  Pending Prescriptions Disp Refills  . B-D UF III MINI PEN NEEDLES 31G X 5 MM MISC [Pharmacy Med Name: BD UF MINI PEN NEEDLE [id number]] 100 each 2    Sig: USE TO INJECT LANTUS ONCE A DAY AND NOVOLOG TWICE DAILY. (TOTAL OF 3 INJECTIONS DAILY)     Endocrinology: Diabetes - Testing Supplies Passed - 03/07/2021  1:42 PM      Passed - Valid encounter within last 12 months    Recent Outpatient Visits          3 weeks ago Type 2 diabetes mellitus with hyperglycemia, with long-term current use of insulin (HCC)   [facility] Marcine Matar, MD   3 months ago Essential hypertension   [facility] [place], Jeannett Senior L, RPH-CPP   4 months ago Type 2 diabetes mellitus with peripheral neuropathy [facility])   [facility] [place], Cornelius Moras, RPH-CPP   4 months ago Type 2 diabetes mellitus with peripheral neuropathy [facility])   [place] [facility] Jonah Blue B, MD   8 months ago Type 2 diabetes mellitus with peripheral neuropathy [facility])    [facility] Marcine Matar, MD      Future Appointments            In 5 days Marcine Matar, MD [facility]

## 2021-03-13 ENCOUNTER — Other Ambulatory Visit: Payer: Self-pay

## 2021-03-13 ENCOUNTER — Ambulatory Visit: Payer: Medicare Other | Attending: Internal Medicine | Admitting: Internal Medicine

## 2021-03-13 VITALS — BP 157/72 | HR 71 | Resp 16 | Wt 103.8 lb

## 2021-03-13 DIAGNOSIS — R600 Localized edema: Secondary | ICD-10-CM | POA: Insufficient documentation

## 2021-03-13 DIAGNOSIS — I10 Essential (primary) hypertension: Secondary | ICD-10-CM | POA: Insufficient documentation

## 2021-03-13 DIAGNOSIS — Z8611 Personal history of tuberculosis: Secondary | ICD-10-CM | POA: Insufficient documentation

## 2021-03-13 DIAGNOSIS — Z23 Encounter for immunization: Secondary | ICD-10-CM | POA: Diagnosis not present

## 2021-03-13 DIAGNOSIS — Z79899 Other long term (current) drug therapy: Secondary | ICD-10-CM | POA: Diagnosis not present

## 2021-03-13 DIAGNOSIS — Z1231 Encounter for screening mammogram for malignant neoplasm of breast: Secondary | ICD-10-CM

## 2021-03-13 DIAGNOSIS — E1165 Type 2 diabetes mellitus with hyperglycemia: Secondary | ICD-10-CM | POA: Insufficient documentation

## 2021-03-13 DIAGNOSIS — Z8249 Family history of ischemic heart disease and other diseases of the circulatory system: Secondary | ICD-10-CM | POA: Insufficient documentation

## 2021-03-13 DIAGNOSIS — Z794 Long term (current) use of insulin: Secondary | ICD-10-CM | POA: Insufficient documentation

## 2021-03-13 LAB — GLUCOSE, POCT (MANUAL RESULT ENTRY): POC Glucose: 126 mg/dl — AB (ref 70–99)

## 2021-03-13 MED ORDER — HYDROCHLOROTHIAZIDE 12.5 MG PO TABS: 12.5000 mg | ORAL_TABLET | Freq: Every day | ORAL | 3 refills | Status: DC

## 2021-03-13 NOTE — Progress Notes (Signed)
Patient ID: Olivia Williamson, female    DOB: 20-Feb-1946  MRN: [id number]  CC: Diabetes and Hypertension   Subjective: Olivia Williamson is a 75 y.o. female who presents for f/u DM, HTN. Y Hin Vicki Mallet from [employer] interprets. 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  I refilled Cozaar.   Taking both since then.  Has a BP device but daughter has not checked BP any this wk Positive LE edema  since last mth.  No SOB at rest or ambulation.  No CP  DM:  checking BS TID but forgot to bring log.  She does not recall her range but states that the blood sugars are better. 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 90 tablet 1   B-D UF III MINI PEN NEEDLES 31G X 5 MM MISC USE TO INJECT LANTUS ONCE A DAY AND NOVOLOG TWICE DAILY. (TOTAL OF 3 INJECTIONS DAILY) 100 each 2   Cholecalciferol (VITAMIN D3) 10 MCG (400 UNIT) tablet TAKE 2 TABLETS BY MOUTH EVERY DAY 100 tablet 8   CVS CALCIUM 600 & VITAMIN D3 600-800 MG-UNIT TABS TAKE 1 TABLET (600 MG TOTAL) BY MOUTH 2 (TWO) TIMES DAILY WITH A MEAL. 180 tablet 1   fluticasone (FLONASE) 50 MCG/ACT nasal spray SPRAY 2 SPRAYS INTO EACH NOSTRIL EVERY DAY 16 mL 2   gabapentin (NEURONTIN) 300 MG capsule TAKE 2 CAPSULES (600 MG TOTAL) BY MOUTH AT BEDTIME. 60 capsule 6   insulin aspart (NOVOLOG FLEXPEN) 100 UNIT/ML FlexPen 8 units with breakfast and dinner 15 mL 11   insulin glargine (LANTUS SOLOSTAR) 100 UNIT/ML Solostar Pen Inject 24 Units into the skin at bedtime. 15 mL 6   losartan (COZAAR) 25 MG tablet Take 0.5 tablets (12.5 mg total) by mouth daily. 45 tablet 1   silver sulfADIAZINE (SILVADENE) 1 % cream Apply 1 application topically daily. 50 g 0   Current Facility-Administered Medications on File Prior to Visit  Medication Dose Route Frequency Provider Last Rate Last Admin   insulin aspart (novoLOG) injection  12 Units  12 Units Subcutaneous Once Marcine Matar, MD        Allergies  Allergen Reactions   Shrimp [Shellfish Allergy]     Sneezing and SOB   Beef-Derived Products Other (See Comments)    Generalized burning sensation   Fruit & Vegetable Daily [Nutritional Supplements] Other (See Comments)    Orange causes lower extremity burning   Metformin And Related Diarrhea    Social History   Socioeconomic History   Marital status: Widowed    Spouse name: Not on file   Number of children: Not on file   Years of education: Not on file   Highest education  level: Not on file  Occupational History   Not on file  Tobacco Use   Smoking status: Never   Smokeless tobacco: Never  Substance and Sexual Activity   Alcohol use: No   Drug use: No   Sexual activity: Not on file  Other Topics Concern   Not on file  Social History Narrative   Not on file   Social Determinants of Health   Financial Resource Strain: Not on file  Food Insecurity: Not on file  Transportation Needs: Not on file  Physical Activity: Not on file  Stress: Not on file  Social Connections: Not on file  Intimate Partner Violence: Not on file    Family History  Problem Relation Age of Onset   Heart disease Maternal Uncle 91    Past Surgical History:  Procedure Laterality Date   ABDOMINAL HYSTERECTOMY     CHOLECYSTECTOMY N/A 05/02/2014   Procedure: LAPAROSCOPIC CHOLECYSTECTOMY ;  Surgeon: Abigail Miyamoto, MD;  Location: [facility] OR;  Service: General;  Laterality: N/A;  laparoscopic cholecystectomy   COLONOSCOPY     NO PAST SURGERIES     VIDEO BRONCHOSCOPY Bilateral 01/10/2016   Procedure: VIDEO BRONCHOSCOPY WITHOUT FLUORO;  Surgeon: Leslye Peer, MD;  Location: [facility] ENDOSCOPY;  Service: Cardiopulmonary;  Laterality: Bilateral;    ROS: Review of Systems Negative except as stated above  PHYSICAL EXAM: BP (!) 157/72   Pulse 71   Resp 16   Wt 103 lb 12.8 oz (47.1 kg)   LMP 05/11/2016   SpO2 99%   BMI 20.97 kg/m   Physical Exam  General appearance - alert, elderly frail female  and in no distress Mental status - normal mood, behavior, speech, dress, motor activity, and thought processes Chest - fine crackles at the bases Heart - normal rate, regular rhythm, normal S1, S2, no murmurs, rubs, clicks or gallops Extremities - trace to 1+ BL LE and pedal edema  Results for orders placed or performed in visit on 03/13/21  POCT glucose (manual entry)  Result Value Ref Range   POC Glucose 126 (A) 70 - 99 mg/dl    CMP Latest Ref Rng & Units 02/12/2021 10/29/2020  05/14/2020  Glucose 65 - 99 mg/dL 11(S) 315(X) 458(P)  BUN 8 - 27 mg/dL 17 92(T) 23  Creatinine 0.57 - 1.00 mg/dL 2.44 6.28(M) 3.81  Sodium 134 - 144 mmol/L 133(L) 136 134(L)  Potassium 3.5 - 5.2 mmol/L 3.8 4.7 4.2  Chloride 96 - 106 mmol/L 93(L) 100 95(L)  CO2 20 - 29 mmol/L 25 24 28   Calcium 8.7 - 10.3 mg/dL 10.5(H) 9.9 10.4(H)  Total Protein 6.0 - 8.5 g/dL - 7.4 7.1  Total Bilirubin 0.0 - 1.2 mg/dL - 0.5 0.7  Alkaline Phos 44 - 121 IU/L - 75 59  AST 0 - 40 IU/L - 21 23  ALT 0 - 32 IU/L - 28 29   Lipid Panel     Component Value Date/Time   CHOL 133 10/29/2020 1621   TRIG 160 (H) 10/29/2020 1621   HDL 43 10/29/2020 1621   CHOLHDL 3.1 10/29/2020 1621   CHOLHDL 2.7 03/18/2019 0358   VLDL 13 03/18/2019 0358   LDLCALC 63 10/29/2020 1621    CBC    Component Value Date/Time   WBC 4.7 10/29/2020 1621   WBC 6.5 05/14/2020 1428   RBC 3.73 (L) 10/29/2020 1621   RBC 4.41 05/14/2020 1428   HGB 8.0 (L) 10/29/2020 1621   HCT 25.9 (L) 10/29/2020 1621   PLT 100 (LL) 10/29/2020 1621   MCV 69 (L) 10/29/2020 1621   MCH 21.4 (L) 10/29/2020 1621   MCH 21.8 (L) 05/14/2020 1428   MCHC 30.9 (L) 10/29/2020 1621   MCHC 33.2 05/14/2020 1428   RDW 16.0 (H) 10/29/2020 1621   LYMPHSABS 1.2 05/14/2020 1428   LYMPHSABS 0.9 05/30/2019 1432   MONOABS 0.4 05/14/2020 1428   EOSABS 0.1 05/14/2020 1428   EOSABS 0.1 05/30/2019 1432   BASOSABS 0.0 05/14/2020 1428   BASOSABS 0.0 05/30/2019 1432    ASSESSMENT AND PLAN: 1. Type 2 diabetes mellitus with hyperglycemia, with long-term current use of insulin (HCC) Remind patient to bring blood sugar readings with her on next visit so that we can adjust the dose of her insulin. Continue current dose of Lantus and NovoLog. - POCT glucose (manual entry) - Ambulatory referral to Ophthalmology  2. Essential hypertension Not at goal.  I wonder whether the lower extremity edema is due to Norvasc versus new CHF.  Stop Norvasc.  Continue Cozaar.  Start  hydrochlorothiazide.  Check BNP - hydrochlorothiazide (HYDRODIURIL) 12.5 MG tablet; Take 1 tablet (12.5 mg total) by mouth daily. Stop Amlodipine  Dispense: 90 tablet; Refill: 3  3. Edema of both legs See plan above. - Brain natriuretic peptide - Basic Metabolic Panel  4. Encounter for screening mammogram for malignant neoplasm of breast - MM Digital Screening; Future  6. Need for immunization against influenza - Flu Vaccine QUAD 28mo+IM (Fluarix, Fluzone & Alfiuria Quad PF)   On next visit and need to address the anemia.  Patient was given the opportunity to ask questions.  Patient verbalized understanding of the plan and was able to repeat key elements of the plan.   Orders Placed This Encounter  Procedures   MM Digital Screening   Flu Vaccine QUAD 36mo+IM (Fluarix, Fluzone & Alfiuria Quad PF)   Brain natriuretic peptide   Basic Metabolic Panel   Ambulatory referral to Ophthalmology   POCT glucose (manual entry)     Requested Prescriptions   Signed Prescriptions Disp Refills   hydrochlorothiazide (HYDRODIURIL) 12.5 MG tablet 90 tablet 3    Sig: Take 1 tablet (12.5 mg total) by mouth daily. Stop Amlodipine    Return in about 1 month (around 04/13/2021).  Jonah Blue, MD, FACP

## 2021-03-14 LAB — BASIC METABOLIC PANEL
BUN/Creatinine Ratio: 30 — ABNORMAL HIGH (ref 12–28)
BUN: 26 mg/dL (ref 8–27)
CO2: 24 mmol/L (ref 20–29)
Calcium: 9.7 mg/dL (ref 8.7–10.3)
Chloride: 103 mmol/L (ref 96–106)
Creatinine, Ser: 0.86 mg/dL (ref 0.57–1.00)
Glucose: 95 mg/dL (ref 70–99)
Potassium: 4.1 mmol/L (ref 3.5–5.2)
Sodium: 138 mmol/L (ref 134–144)
eGFR: 72 mL/min/{1.73_m2} (ref >59)

## 2021-03-14 LAB — BRAIN NATRIURETIC PEPTIDE: BNP: 179.3 pg/mL — ABNORMAL HIGH (ref 0.0–100.0)

## 2021-03-15 NOTE — Progress Notes (Signed)
Kidney function is good.  Please inquire whether the swelling in her legs has resolved with [place] having discontinue the amlodipine.

## 2021-03-19 ENCOUNTER — Telehealth: Payer: Self-pay

## 2021-03-19 NOTE — Telephone Encounter (Signed)
Contacted pt to go over lab results pt didn't answer lvm   Sent a CRM and forward labs to NT to give pt labs when they call back   

## 2021-04-06 ENCOUNTER — Other Ambulatory Visit: Payer: Self-pay | Admitting: Internal Medicine

## 2021-04-06 DIAGNOSIS — I10 Essential (primary) hypertension: Secondary | ICD-10-CM

## 2021-04-06 NOTE — Telephone Encounter (Signed)
Requested medication (s) are due for refill today: Yes  Requested medication (s) are on the active medication list: Yes  Last refill:  10/11/20  Future visit scheduled: Yes  Notes to clinic:  Unable to refill per protocol, last OV noted to stop Norvasc, will send to provider for approval      Requested Prescriptions  Pending Prescriptions Disp Refills   amLODipine (NORVASC) 10 MG tablet [Pharmacy Med Name: AMLODIPINE BESYLATE 10 MG TAB] 90 tablet 1    Sig: TAKE 1 TABLET BY MOUTH EVERY DAY     Cardiovascular:  Calcium Channel Blockers Failed - 04/06/2021 12:48 AM      Failed - Last BP in normal range    BP Readings from Last 1 Encounters:  03/13/21 (!) 157/72          Passed - Valid encounter within last 6 months    Recent Outpatient Visits           3 weeks ago Type 2 diabetes mellitus with hyperglycemia, with long-term current use of insulin (HCC)   [facility] [place], Gavin Pound B, MD   1 month ago Type 2 diabetes mellitus with hyperglycemia, with long-term current use of insulin (HCC)   [place] [facility] Marcine Matar, MD   4 months ago Essential hypertension   [facility] [place], Jeannett Senior L, RPH-CPP   5 months ago Type 2 diabetes mellitus with peripheral neuropathy [facility])   [place] [facility] [place], Cornelius Moras, RPH-CPP   5 months ago Type 2 diabetes mellitus with peripheral neuropathy [facility])    [facility] Marcine Matar, MD       Future Appointments             In 1 week Marcine Matar, MD [facility]

## 2021-04-14 ENCOUNTER — Ambulatory Visit: Payer: Medicare Other | Attending: Internal Medicine | Admitting: Internal Medicine

## 2021-04-14 ENCOUNTER — Encounter: Payer: Self-pay | Admitting: Internal Medicine

## 2021-04-14 ENCOUNTER — Other Ambulatory Visit: Payer: Self-pay

## 2021-04-14 VITALS — BP 168/79 | HR 78 | Resp 16 | Wt 103.0 lb

## 2021-04-14 DIAGNOSIS — Z794 Long term (current) use of insulin: Secondary | ICD-10-CM

## 2021-04-14 DIAGNOSIS — D696 Thrombocytopenia, unspecified: Secondary | ICD-10-CM

## 2021-04-14 DIAGNOSIS — E559 Vitamin D deficiency, unspecified: Secondary | ICD-10-CM

## 2021-04-14 DIAGNOSIS — D582 Other hemoglobinopathies: Secondary | ICD-10-CM

## 2021-04-14 DIAGNOSIS — G8929 Other chronic pain: Secondary | ICD-10-CM

## 2021-04-14 DIAGNOSIS — E1165 Type 2 diabetes mellitus with hyperglycemia: Secondary | ICD-10-CM

## 2021-04-14 DIAGNOSIS — I10 Essential (primary) hypertension: Secondary | ICD-10-CM | POA: Diagnosis not present

## 2021-04-14 DIAGNOSIS — D509 Iron deficiency anemia, unspecified: Secondary | ICD-10-CM

## 2021-04-14 DIAGNOSIS — M546 Pain in thoracic spine: Secondary | ICD-10-CM

## 2021-04-14 LAB — GLUCOSE, POCT (MANUAL RESULT ENTRY): POC Glucose: 190 mg/dl — AB (ref 70–99)

## 2021-04-14 MED ORDER — LANTUS SOLOSTAR 100 UNIT/ML ~~LOC~~ SOPN: 34.0000 [IU] | PEN_INJECTOR | Freq: Every day | SUBCUTANEOUS | 6 refills | Status: DC

## 2021-04-14 MED ORDER — LOSARTAN POTASSIUM 25 MG PO TABS: 25.0000 mg | ORAL_TABLET | Freq: Every day | ORAL | 3 refills | Status: DC

## 2021-04-14 NOTE — Progress Notes (Addendum)
Patient ID: Olivia Williamson, female    DOB: Aug 09, 1945  MRN: [id number]  CC: Diabetes and Hypertension   Subjective: Olivia Williamson is a 75 y.o. female who presents for  1 mth f/u DM and HTN.  Olivia Williamson from [facility] interprets and daughter is with her. 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  Changed to HCTZ .  Lower extremity edema has resolved.  Continued on Cozaar.  Pt has bottles with her. Took meds already today.    DM:  reports taking  Lantus 32 daily and Novolog 12 units BID She has some blood sugar readings with her recorded on a piece of paper but is disorganized.  She is checking blood sugars before breakfast and before dinner.  Looks like before breakfast range was 83-192 with highest reading being 229.  Occasional reading in the 50s to 60s.  Before dinner range is 98-198.  There are some readings in the 200s.  Patient tells me she can feel when her blood sugar is low and she eats something no drinks juice.  Back pain - mid to lowe thoracic spine for yrs >3-4 yrs.  Not able to tell if any worse. Pain intermittent.  Last about 10 minutes and tends to flareup if she does a lot of bending over.  Pain goes away with laying down for short period  Chronic anemia; patient has chronic microcytic anemia with known hemoglobin E.  Hemoglobin range has been 8-9.6.  She also has low platelet count but platelet aggregation noted on CBC samples.  She denies any fatigue or dizziness.  Reports dizziness only when blood sugar is low.  No blood in the stools.  She has not had any weight loss.  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 (TOTAL OF 3 INJECTIONS DAILY) 100 each 2   Cholecalciferol (VITAMIN D3) 10 MCG (400 UNIT) tablet TAKE 2 TABLETS BY MOUTH EVERY DAY 100 tablet 8   [employer] CALCIUM 600 & VITAMIN D3 600-800 MG-UNIT TABS TAKE 1 TABLET (600 MG TOTAL) BY MOUTH 2 (TWO) TIMES DAILY WITH A MEAL. 180 tablet 1   fluticasone (FLONASE) 50 MCG/ACT nasal spray SPRAY 2 SPRAYS INTO EACH NOSTRIL EVERY DAY 16 mL 2   gabapentin (NEURONTIN) 300 MG capsule TAKE 2  CAPSULES (600 MG TOTAL) BY MOUTH AT BEDTIME. 60 capsule 6   hydrochlorothiazide (HYDRODIURIL) 12.5 MG tablet Take 1 tablet (12.5 mg total) by mouth daily. Stop Amlodipine 90 tablet 3   insulin aspart (NOVOLOG FLEXPEN) 100 UNIT/ML FlexPen 8 units with breakfast and dinner 15 mL 11   silver sulfADIAZINE (SILVADENE) 1 % cream Apply 1 application topically daily. 50 g 0   No current facility-administered medications on file prior to visit.    Allergies  Allergen Reactions   Shrimp [Shellfish Allergy]     Sneezing and SOB   Beef-Derived Products Other (See Comments)    Generalized burning sensation    Fruit & Vegetable Daily [Nutritional Supplements] Other (See Comments)    Orange causes lower extremity burning   Metformin And Related Diarrhea   Norvasc [Amlodipine]     Le EDEMA    Social History   Socioeconomic History   Marital status: Widowed    Spouse name: Not on file   Number of children: Not on file   Years of education: Not on file   Highest education level: Not on file  Occupational History   Not on file  Tobacco Use   Smoking status: Never   Smokeless tobacco: Never  Substance and Sexual Activity   Alcohol use: No   Drug use: No   Sexual activity: Not on file  Other Topics Concern   Not on file  Social History Narrative   Not on file   Social Determinants of Health   Financial Resource Strain: Not on file  Food Insecurity: Not on file  Transportation Needs: Not on file  Physical Activity: Not on file  Stress: Not on file  Social Connections: Not on file  Intimate Partner Violence: Not on file    Family History  Problem Relation Age of Onset   Heart disease Maternal Uncle 58    Past Surgical History:  Procedure Laterality Date   ABDOMINAL HYSTERECTOMY     CHOLECYSTECTOMY N/A 05/02/2014   Procedure: LAPAROSCOPIC CHOLECYSTECTOMY ;  Surgeon: Abigail Miyamoto, MD;  Location: [facility] OR;  Service: General;  Laterality: N/A;  laparoscopic cholecystectomy   COLONOSCOPY     NO PAST SURGERIES     VIDEO BRONCHOSCOPY Bilateral 01/10/2016   Procedure: VIDEO BRONCHOSCOPY WITHOUT FLUORO;  Surgeon: Leslye Peer, MD;  Location: [facility] ENDOSCOPY;  Service: Cardiopulmonary;  Laterality: Bilateral;    ROS: Review of Systems Negative except as stated above  PHYSICAL EXAM: BP (!) 168/79   Pulse 78   Resp 16   Wt 103 lb (46.7 kg)   LMP 05/11/2016   SpO2 100%   BMI 20.80 kg/m   Wt Readings from Last 3 Encounters:  04/14/21 103 lb (46.7 kg)  03/13/21 103 lb 12.8 oz (47.1 kg)  02/11/21 90 lb 12.8 oz (41.2 kg)    Physical Exam  General appearance -frail elderly  female in NAD  Mental status -she is somewhat of a poor historian  Chest - clear to auscultation, no wheezes, rales or rhonchi, symmetric air entry Heart - normal rate, regular rhythm, normal S1, S2, no murmurs, rubs, clicks or gallops Extremities -no lower extremity edema. MSK: She has a curvature of the thoracic spine.  No tenderness on palpation of the mid to lower thoracic spine.  CMP Latest Ref Rng & Units 03/13/2021 02/12/2021 10/29/2020  Glucose 70 - 99 mg/dL 95 76(P) 950(D)  BUN 8 - 27 mg/dL 26 17 32(I)  Creatinine 0.57 - 1.00 mg/dL 7.12 4.58 0.99(I)  Sodium  134 - 144 mmol/L 138 133(L) 136  Potassium 3.5 - 5.2 mmol/L 4.1 3.8 4.7  Chloride 96 - 106 mmol/L 103 93(L) 100  CO2 20 - 29 mmol/L 24 25 24   Calcium 8.7 - 10.3 mg/dL 9.7 10.5(H) 9.9  Total Protein 6.0 - 8.5 g/dL - - 7.4  Total Bilirubin 0.0 - 1.2 mg/dL - - 0.5  Alkaline Phos 44 - 121 IU/L - - 75  AST 0 - 40 IU/L - - 21  ALT 0 - 32 IU/L - - 28   Lipid Panel     Component Value Date/Time   CHOL 133 10/29/2020 1621   TRIG 160 (H) 10/29/2020 1621   HDL 43 10/29/2020 1621   CHOLHDL 3.1 10/29/2020 1621   CHOLHDL 2.7 03/18/2019 0358   VLDL 13 03/18/2019 0358   LDLCALC 63 10/29/2020 1621    CBC    Component Value Date/Time   WBC 4.7 10/29/2020 1621   WBC 6.5 05/14/2020 1428   RBC 3.73 (L) 10/29/2020 1621   RBC 4.41 05/14/2020 1428   HGB 8.0 (L) 10/29/2020 1621   HCT 25.9 (L) 10/29/2020 1621   PLT 100 (LL) 10/29/2020 1621   MCV 69 (L) 10/29/2020 1621   MCH 21.4 (L) 10/29/2020 1621   MCH 21.8 (L) 05/14/2020 1428   MCHC 30.9 (L) 10/29/2020 1621   MCHC 33.2 05/14/2020 1428   RDW 16.0 (H) 10/29/2020 1621   LYMPHSABS 1.2 05/14/2020 1428   LYMPHSABS 0.9 05/30/2019 1432   MONOABS 0.4 05/14/2020 1428   EOSABS 0.1 05/14/2020 1428   EOSABS 0.1 05/30/2019 1432   BASOSABS 0.0 05/14/2020 1428   BASOSABS 0.0 05/30/2019 1432    ASSESSMENT AND PLAN: 1. Type 2 diabetes mellitus with hyperglycemia, with long-term current  use of insulin (HCC) I recommend increasing the Lantus insulin just a little bit from 32 units daily to 34 units daily.  Continue current dose of NovoLog.  Encouraged her to continue to keep a log of blood sugar readings.  Went over hypoglycemic symptoms and how to treat. - POCT glucose (manual entry) - insulin glargine (LANTUS SOLOSTAR) 100 UNIT/ML Solostar Pen; Inject 34 Units into the skin at bedtime.  Dispense: 15 mL; Refill: 6  2. Essential hypertension Not at goal.  Increase Cozaar to the full 25 mg tablet once a day.  Continue HCTZ 12.5 mg daily.  Follow-up with clinical pharmacist in 3 weeks for repeat blood pressure check. - losartan (COZAAR) 25 MG tablet; Take 1 tablet (25 mg total) by mouth daily.  Dispense: 90 tablet; Refill: 3 - Basic metabolic panel  3. Hemoglobin E (hb-e) (HCC) - Ambulatory referral to Hematology / Oncology - CBC  4. Thrombocytopenia (HCC) - Ambulatory referral to Hematology / Oncology  5. Chronic midline thoracic back pain Take Tylenol as needed.  6. Vitamin D deficiency - VITAMIN D 25 Hydroxy (Vit-D Deficiency, Fractures)  Pt's daughter An Bastyr [phone]   Patient was given the opportunity to ask questions.  Patient verbalized understanding of the plan and was able to repeat key elements of the plan.   Addendum 04/15/2021: H&H now at 7.4/25.  In June of this year it was 8/25.  I will add iron studies and refer patient to gastroenterology for colon cancer screening.  Orders Placed This Encounter  Procedures   CBC   Basic metabolic panel   VITAMIN D 25 Hydroxy (Vit-D Deficiency, Fractures)   Ambulatory referral to Hematology / Oncology   POCT glucose (manual entry)     Requested  Prescriptions   Signed Prescriptions Disp Refills   losartan (COZAAR) 25 MG tablet 90 tablet 3    Sig: Take 1 tablet (25 mg total) by mouth daily.   insulin glargine (LANTUS SOLOSTAR) 100 UNIT/ML Solostar Pen 15 mL 6    Sig: Inject 34 Units into the skin at  bedtime.    Return in about 3 months (around 07/15/2021) for Appt with [facility] in 3 wks for BP check.  Jonah Blue, MD, FACP

## 2021-04-15 ENCOUNTER — Other Ambulatory Visit: Payer: Self-pay | Admitting: Internal Medicine

## 2021-04-15 NOTE — Telephone Encounter (Signed)
Requested medications are on the active medication list there are two vitamin D rx on current med list  Last refill 02/19/21  Last visit 04/14/21  Future visit scheduled 07/15/21  Notes to clinic two vitamin D's on current med list, lab from 12/30/2015, new labs ordered this week but not drawn yet. Requested Prescriptions  Pending Prescriptions Disp Refills   Cholecalciferol (VITAMIN D3) 10 MCG (400 UNIT) tablet [Pharmacy Med Name: VITAMIN D3 400 UNIT TABLET] 100 tablet 8    Sig: TAKE 2 TABLETS BY MOUTH EVERY DAY     Endocrinology:  Vitamins - Vitamin D Supplementation Failed - 04/15/2021  1:32 AM      Failed - 50,000 IU strengths are not delegated      Failed - Phosphate in normal range and within 360 days    Phosphorus  Date Value Ref Range Status  03/17/2019 2.3 (L) 2.5 - 4.6 mg/dL Final    Comment:    Performed at [facility], [address], [place], [place] [address]          Failed - Vitamin D in normal range and within 360 days    Vit D, 25-Hydroxy  Date Value Ref Range Status  12/30/2015 23 (L) 30 - 100 ng/mL Final    Comment:    Vitamin D Status           25-OH Vitamin D        Deficiency                <20 ng/mL        Insufficiency         20 - 29 ng/mL        Optimal             > or = 30 ng/mL   For 25-OH Vitamin D testing on patients on D2-supplementation and patients for whom quantitation of D2 and D3 fractions is required, the QuestAssureD 25-OH VIT D, (D2,D3), LC/MS/MS is recommended: order code [id number] (patients > 2 yrs).           Passed - Ca in normal range and within 360 days    Calcium  Date Value Ref Range Status  03/13/2021 9.7 8.7 - 10.3 mg/dL Final          Passed - Valid encounter within last 12 months    Recent Outpatient Visits           Yesterday Type 2 diabetes mellitus with hyperglycemia, with long-term current use of insulin (HCC)   [facility] [place], Gavin Pound B, MD   1 month ago Type 2  diabetes mellitus with hyperglycemia, with long-term current use of insulin (HCC)   [facility] Jonah Blue B, MD   2 months ago Type 2 diabetes mellitus with hyperglycemia, with long-term current use of insulin [facility])   [place] Marcine Matar, MD   4 months ago Essential hypertension   [facility] [place], Jeannett Senior L, RPH-CPP   5 months ago Type 2 diabetes mellitus with peripheral neuropathy [facility])   [facility] Lois Huxley, Cornelius Moras, RPH-CPP       Future Appointments             In 2 weeks Lois Huxley, Cornelius Moras, RPH-CPP [facility]   In 3 months Laural Benes, Binnie Rail, MD [facility]

## 2021-04-15 NOTE — Addendum Note (Signed)
Addended by: Jonah Blue B on: 04/15/2021 12:58 PM   Modules accepted: Orders

## 2021-04-16 LAB — BASIC METABOLIC PANEL
BUN/Creatinine Ratio: 38 — ABNORMAL HIGH (ref 12–28)
BUN: 43 mg/dL — ABNORMAL HIGH (ref 8–27)
CO2: 24 mmol/L (ref 20–29)
Calcium: 9 mg/dL (ref 8.7–10.3)
Chloride: 100 mmol/L (ref 96–106)
Creatinine, Ser: 1.13 mg/dL — ABNORMAL HIGH (ref 0.57–1.00)
Glucose: 140 mg/dL — ABNORMAL HIGH (ref 70–99)
Potassium: 5.2 mmol/L (ref 3.5–5.2)
Sodium: 134 mmol/L (ref 134–144)
eGFR: 52 mL/min/{1.73_m2} — ABNORMAL LOW (ref >59)

## 2021-04-16 LAB — CBC
Hematocrit: 25 % — ABNORMAL LOW (ref 34.0–46.6)
Hemoglobin: 7.4 g/dL — ABNORMAL LOW (ref 11.1–15.9)
MCH: 20.1 pg — ABNORMAL LOW (ref 26.6–33.0)
MCHC: 29.6 g/dL — ABNORMAL LOW (ref 31.5–35.7)
MCV: 68 fL — ABNORMAL LOW (ref 79–97)
Platelets: 89 10*3/uL — CL (ref 150–450)
RBC: 3.69 x10E6/uL — ABNORMAL LOW (ref 3.77–5.28)
RDW: 16.7 % — ABNORMAL HIGH (ref 11.7–15.4)
WBC: 4.6 10*3/uL (ref 3.4–10.8)

## 2021-04-16 LAB — VITAMIN D 25 HYDROXY (VIT D DEFICIENCY, FRACTURES): Vit D, 25-Hydroxy: 33.6 ng/mL (ref 30.0–100.0)

## 2021-04-17 LAB — IRON,TIBC AND FERRITIN PANEL
Ferritin: 470 ng/mL — ABNORMAL HIGH (ref 15–150)
Iron Saturation: 26 % (ref 15–55)
Iron: 78 ug/dL (ref 27–139)
Total Iron Binding Capacity: 300 ug/dL (ref 250–450)
UIBC: 222 ug/dL (ref 118–369)

## 2021-04-17 LAB — SPECIMEN STATUS REPORT

## 2021-04-19 ENCOUNTER — Other Ambulatory Visit: Payer: Self-pay | Admitting: Internal Medicine

## 2021-04-20 NOTE — Telephone Encounter (Signed)
Requested Prescriptions  Pending Prescriptions Disp Refills  . fluticasone (FLONASE) 50 MCG/ACT nasal spray [Pharmacy Med Name: FLUTICASONE PROP 50 MCG SPRAY] 48 mL 2    Sig: SPRAY 2 SPRAYS INTO EACH NOSTRIL EVERY DAY     Ear, Nose, and Throat: Nasal Preparations - Corticosteroids Passed - 04/19/2021 12:47 AM      Passed - Valid encounter within last 12 months    Recent Outpatient Visits          6 days ago Type 2 diabetes mellitus with hyperglycemia, with long-term current use of insulin (HCC)   [place] [facility] [place], Gavin Pound B, MD   1 month ago Type 2 diabetes mellitus with hyperglycemia, with long-term current use of insulin (HCC)   [place] [facility] [place], Gavin Pound B, MD   2 months ago Type 2 diabetes mellitus with hyperglycemia, with long-term current use of insulin [facility])   [place] [facility] Marcine Matar, MD   4 months ago Essential hypertension   [facility] [place], Jeannett Senior L, RPH-CPP   5 months ago Type 2 diabetes mellitus with peripheral neuropathy [facility])   [facility] Lois Huxley, Cornelius Moras, RPH-CPP      Future Appointments            In 2 weeks Lois Huxley, Cornelius Moras, RPH-CPP [facility]   In 2 months Laural Benes, Binnie Rail, MD [facility]

## 2021-04-23 ENCOUNTER — Telehealth: Payer: Self-pay | Admitting: Internal Medicine

## 2021-04-23 ENCOUNTER — Telehealth: Payer: Self-pay | Admitting: Hematology

## 2021-04-23 NOTE — Telephone Encounter (Signed)
-----   Message from Dionne Bucy sent at 04/23/2021 11:20 AM EST ----- Regarding: Appointments Patient schedule   with Oncology  12/15 at 11:20 with Dr. Candise Che.   Lvm with appointment  details  I will call back again   Working on  Eye Appt  ----- Message ----- From: Marcine Matar, MD Sent: 04/14/2021   5:06 PM EST To: Dionne Bucy  I have referred this patient for eye exam and to the hematologist.  She is non-English-speaking.  Her daughter states that she was called for the eye appointment but they were asking a lot of questions and patient's daughter Lenox Ponds is a little limited.  She is requesting that we schedule the appointments for them for the eye exam and the hematologist and that you call her with the appointment date and times.  Her name is An Welke and phone number is [phone].

## 2021-04-23 NOTE — Telephone Encounter (Signed)
Scheduled appt per 11/21 referral. Vela Prose at Dr. Henriette Combs office told me she would communicate appt to pt's daughter.

## 2021-04-30 ENCOUNTER — Encounter: Payer: Self-pay | Admitting: Internal Medicine

## 2021-04-30 NOTE — Progress Notes (Signed)
We have been able to reach patient to go over her last lab results which showed worsening anemia and platelet count.  She is scheduled to see the hematologist on the 15th of this month.  However they have not been able to reach her either to inform her of this appointment.  She has an upcoming appointment with the clinical pharmacist on 05/05/2021.  I have sent a message to him to let us know when she comes to that appointment so that we can inform of the lab results and of the upcoming appointment with the hematologist.

## 2021-05-05 ENCOUNTER — Ambulatory Visit: Payer: Medicare Other | Admitting: Pharmacist

## 2021-05-06 ENCOUNTER — Telehealth: Payer: Self-pay | Admitting: Hematology

## 2021-05-06 NOTE — Telephone Encounter (Signed)
Pt's daughter called in to r/s her mother's new hem appt due to her mother being sick. She requested to r/s after the New Year. I r/s her appt to January, her daughter is aware of new appt date and time.

## 2021-05-08 ENCOUNTER — Encounter: Payer: Medicare Other | Admitting: Hematology

## 2021-05-25 ENCOUNTER — Other Ambulatory Visit: Payer: Self-pay | Admitting: Internal Medicine

## 2021-05-27 ENCOUNTER — Inpatient Hospital Stay (HOSPITAL_BASED_OUTPATIENT_CLINIC_OR_DEPARTMENT_OTHER): Payer: Medicare Other | Admitting: Hematology

## 2021-05-27 ENCOUNTER — Other Ambulatory Visit: Payer: Self-pay

## 2021-05-27 ENCOUNTER — Inpatient Hospital Stay: Payer: Medicare Other

## 2021-05-27 VITALS — BP 128/52 | HR 69 | Temp 98.1°F | Resp 18 | Ht 59.0 in | Wt 102.7 lb

## 2021-05-27 DIAGNOSIS — D649 Anemia, unspecified: Secondary | ICD-10-CM

## 2021-05-27 DIAGNOSIS — I10 Essential (primary) hypertension: Secondary | ICD-10-CM

## 2021-05-27 DIAGNOSIS — R0602 Shortness of breath: Secondary | ICD-10-CM | POA: Diagnosis not present

## 2021-05-27 DIAGNOSIS — D565 Hemoglobin E-beta thalassemia: Secondary | ICD-10-CM

## 2021-05-27 DIAGNOSIS — D61818 Other pancytopenia: Secondary | ICD-10-CM | POA: Diagnosis not present

## 2021-05-27 DIAGNOSIS — Z9049 Acquired absence of other specified parts of digestive tract: Secondary | ICD-10-CM | POA: Insufficient documentation

## 2021-05-27 DIAGNOSIS — E871 Hypo-osmolality and hyponatremia: Secondary | ICD-10-CM | POA: Diagnosis present

## 2021-05-27 DIAGNOSIS — R5382 Chronic fatigue, unspecified: Secondary | ICD-10-CM | POA: Diagnosis not present

## 2021-05-27 DIAGNOSIS — N1831 Chronic kidney disease, stage 3a: Secondary | ICD-10-CM | POA: Diagnosis not present

## 2021-05-27 DIAGNOSIS — M1990 Unspecified osteoarthritis, unspecified site: Secondary | ICD-10-CM | POA: Diagnosis not present

## 2021-05-27 DIAGNOSIS — Z90710 Acquired absence of both cervix and uterus: Secondary | ICD-10-CM | POA: Diagnosis not present

## 2021-05-27 DIAGNOSIS — Z794 Long term (current) use of insulin: Secondary | ICD-10-CM

## 2021-05-27 DIAGNOSIS — D696 Thrombocytopenia, unspecified: Secondary | ICD-10-CM

## 2021-05-27 DIAGNOSIS — R945 Abnormal results of liver function studies: Secondary | ICD-10-CM | POA: Diagnosis not present

## 2021-05-27 DIAGNOSIS — Z888 Allergy status to other drugs, medicaments and biological substances status: Secondary | ICD-10-CM | POA: Insufficient documentation

## 2021-05-27 DIAGNOSIS — Z8249 Family history of ischemic heart disease and other diseases of the circulatory system: Secondary | ICD-10-CM

## 2021-05-27 DIAGNOSIS — E11649 Type 2 diabetes mellitus with hypoglycemia without coma: Secondary | ICD-10-CM | POA: Diagnosis not present

## 2021-05-27 DIAGNOSIS — R1013 Epigastric pain: Secondary | ICD-10-CM | POA: Diagnosis not present

## 2021-05-27 DIAGNOSIS — D582 Other hemoglobinopathies: Secondary | ICD-10-CM

## 2021-05-27 DIAGNOSIS — N179 Acute kidney failure, unspecified: Secondary | ICD-10-CM | POA: Diagnosis not present

## 2021-05-27 DIAGNOSIS — K219 Gastro-esophageal reflux disease without esophagitis: Secondary | ICD-10-CM | POA: Diagnosis not present

## 2021-05-27 DIAGNOSIS — Z79899 Other long term (current) drug therapy: Secondary | ICD-10-CM

## 2021-05-27 DIAGNOSIS — E1122 Type 2 diabetes mellitus with diabetic chronic kidney disease: Secondary | ICD-10-CM | POA: Diagnosis not present

## 2021-05-27 DIAGNOSIS — U071 COVID-19: Secondary | ICD-10-CM | POA: Diagnosis not present

## 2021-05-27 DIAGNOSIS — E119 Type 2 diabetes mellitus without complications: Secondary | ICD-10-CM

## 2021-05-27 DIAGNOSIS — R112 Nausea with vomiting, unspecified: Secondary | ICD-10-CM | POA: Diagnosis not present

## 2021-05-27 DIAGNOSIS — D563 Thalassemia minor: Secondary | ICD-10-CM

## 2021-05-27 DIAGNOSIS — I1310 Hypertensive heart and chronic kidney disease without heart failure, with stage 1 through stage 4 chronic kidney disease, or unspecified chronic kidney disease: Secondary | ICD-10-CM | POA: Diagnosis not present

## 2021-05-27 DIAGNOSIS — E1149 Type 2 diabetes mellitus with other diabetic neurological complication: Secondary | ICD-10-CM | POA: Diagnosis not present

## 2021-05-27 DIAGNOSIS — E860 Dehydration: Secondary | ICD-10-CM | POA: Diagnosis not present

## 2021-05-27 DIAGNOSIS — R4182 Altered mental status, unspecified: Secondary | ICD-10-CM | POA: Diagnosis not present

## 2021-05-27 LAB — CMP (CANCER CENTER ONLY)
ALT: 67 U/L — ABNORMAL HIGH (ref 0–44)
AST: 56 U/L — ABNORMAL HIGH (ref 15–41)
Albumin: 3.6 g/dL (ref 3.5–5.0)
Alkaline Phosphatase: 67 U/L (ref 38–126)
Anion gap: 8 (ref 5–15)
BUN: 46 mg/dL — ABNORMAL HIGH (ref 8–23)
CO2: 24 mmol/L (ref 22–32)
Calcium: 8.3 mg/dL — ABNORMAL LOW (ref 8.9–10.3)
Chloride: 94 mmol/L — ABNORMAL LOW (ref 98–111)
Creatinine: 1.21 mg/dL — ABNORMAL HIGH (ref 0.44–1.00)
GFR, Estimated: 47 mL/min — ABNORMAL LOW (ref >60)
Glucose, Bld: 140 mg/dL — ABNORMAL HIGH (ref 70–99)
Potassium: 4 mmol/L (ref 3.5–5.1)
Sodium: 126 mmol/L — ABNORMAL LOW (ref 135–145)
Total Bilirubin: 0.5 mg/dL (ref 0.3–1.2)
Total Protein: 7.7 g/dL (ref 6.5–8.1)

## 2021-05-27 LAB — CBC WITH DIFFERENTIAL/PLATELET
Abs Immature Granulocytes: 0 10*3/uL (ref 0.00–0.07)
Basophils Absolute: 0 10*3/uL (ref 0.0–0.1)
Basophils Relative: 0 %
Eosinophils Absolute: 0 10*3/uL (ref 0.0–0.5)
Eosinophils Relative: 1 %
HCT: 22.7 % — ABNORMAL LOW (ref 36.0–46.0)
Hemoglobin: 7.3 g/dL — ABNORMAL LOW (ref 12.0–15.0)
Immature Granulocytes: 0 %
Lymphocytes Relative: 35 %
Lymphs Abs: 0.7 10*3/uL (ref 0.7–4.0)
MCH: 20.3 pg — ABNORMAL LOW (ref 26.0–34.0)
MCHC: 32.2 g/dL (ref 30.0–36.0)
MCV: 63.1 fL — ABNORMAL LOW (ref 80.0–100.0)
Monocytes Absolute: 0.3 10*3/uL (ref 0.1–1.0)
Monocytes Relative: 14 %
Neutro Abs: 1.1 10*3/uL — ABNORMAL LOW (ref 1.7–7.7)
Neutrophils Relative %: 50 %
Platelets: 82 10*3/uL — ABNORMAL LOW (ref 150–400)
RBC: 3.6 MIL/uL — ABNORMAL LOW (ref 3.87–5.11)
RDW: 16 % — ABNORMAL HIGH (ref 11.5–15.5)
WBC: 2.1 10*3/uL — ABNORMAL LOW (ref 4.0–10.5)
nRBC: 0 % (ref 0.0–0.2)

## 2021-05-27 LAB — FERRITIN: Ferritin: 552 ng/mL — ABNORMAL HIGH (ref 11–307)

## 2021-05-27 LAB — DIRECT ANTIGLOBULIN TEST (NOT AT ARMC)
DAT, IgG: NEGATIVE
DAT, complement: NEGATIVE

## 2021-05-27 LAB — SAMPLE TO BLOOD BANK

## 2021-05-27 LAB — IRON AND IRON BINDING CAPACITY (CC-WL,HP ONLY)
Iron: 29 ug/dL (ref 28–170)
Saturation Ratios: 10 % — ABNORMAL LOW (ref 10.4–31.8)
TIBC: 297 ug/dL (ref 250–450)
UIBC: 268 ug/dL (ref 148–442)

## 2021-05-27 LAB — LACTATE DEHYDROGENASE: LDH: 152 U/L (ref 98–192)

## 2021-05-27 LAB — VITAMIN B12: Vitamin B-12: 884 pg/mL (ref 180–914)

## 2021-05-27 LAB — IMMATURE PLATELET FRACTION: Immature Platelet Fraction: 8.6 % (ref 1.2–8.6)

## 2021-05-27 NOTE — Progress Notes (Addendum)
Marland Kitchen   HEMATOLOGY/ONCOLOGY CONSULTATION NOTE  Date of Service: 05/27/2021  Patient Care Team: [name], MD as PCP - General (Internal Medicine)  CHIEF COMPLAINTS/PURPOSE OF CONSULTATION:  Hgb E disease Anemia  HISTORY OF PRESENTING ILLNESS:   Olivia Williamson is a wonderful 76 y.o. female who has been referred to [place] by Dr .Wynetta Emery, Dalbert Batman, MD  for evaluation and management of Anemia.  Patient has a history of hypertension, dyslipidemia, diabetes, arthritis, GERD, previous history of tuberculosis (treated in 2017- [facility], Pulm - Dr Lamonte Sakai) who has been referred to [place] for further evaluation and management of chronic anemia and thrombocytopenia.  She is here with a family member and Stage [employer] interpreter for this clinic visit.  Review of labs all the way back to January 2018 revealed the patient has had chronic microcytic anemia with a hemoglobin ranging between 7-9.5.  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  With a platelet count varying between 74-108k in the last few years.  She last had a CBC with her primary care physician on 04/14/2021 with a hemoglobin of 7.4, WBC count of 4.6k and a platelet count of 89k (with note made of platelet aggregation)  Patient currently notes no overt bleeding. Her hemoglobin levels today on labs are about the same at 7.3.  She notes no overt lightheadedness, dizziness, chest pain or shortness of breath.  Feels like she is pretty close to her baseline. Notes no new medications. Is not on any regular multivitamins or B vitamins. Not on iron replacement.  Patient does not note history of frequent PRBC transfusions.  She is not sure but notes that she might have had blood transfusions " a few times".  No new bone pains.  No acute weight loss.  No new respiratory  symptoms.   MEDICAL HISTORY:  Past Medical History:  Diagnosis Date   Arthritis    Diabetes mellitus without complication ([place])    stopped DM meds 2004   GERD (gastroesophageal reflux disease)    Hypertension    meds stopped 1 week ago , need to get renewed   Tuberculosis    ACTIVE    SURGICAL HISTORY: Past Surgical History:  Procedure Laterality Date   ABDOMINAL HYSTERECTOMY     CHOLECYSTECTOMY N/A 05/02/2014   Procedure: LAPAROSCOPIC CHOLECYSTECTOMY ;  Surgeon: Coralie Keens, MD;  Location: [facility];  Service: General;  Laterality: N/A;  laparoscopic cholecystectomy   COLONOSCOPY     NO PAST SURGERIES     VIDEO BRONCHOSCOPY Bilateral 01/10/2016   Procedure: VIDEO BRONCHOSCOPY WITHOUT FLUORO;  Surgeon: Collene Gobble, MD;  Location: [facility];  Service: Cardiopulmonary;  Laterality: Bilateral;    SOCIAL HISTORY: Social History   Socioeconomic History   Marital status: Widowed    Spouse name: Not on file   Number of children: Not on file   Years of education: Not on file   Highest education level: Not on file  Occupational History   Not on file  Tobacco Use   Smoking status: Never   Smokeless tobacco: Never  Substance and Sexual Activity   Alcohol use: No   Drug use: No   Sexual activity: Not on file  Other Topics Concern   Not on file  Social History Narrative   Not on file   Social Determinants of Health   Financial Resource  Strain: Not on file  Food Insecurity: Not on file  Transportation Needs: Not on file  Physical Activity: Not on file  Stress: Not on file  Social Connections: Not on file  Intimate Partner Violence: Not on file    FAMILY HISTORY: Family History  Problem Relation Age of Onset   Heart disease Maternal Uncle 25    ALLERGIES:  is allergic to shrimp [shellfish allergy], beef-derived products, fruit & vegetable daily [nutritional supplements], metformin and related, and norvasc [amlodipine].  MEDICATIONS:  Current Outpatient  Medications  Medication Sig Dispense Refill   B-D UF III MINI PEN NEEDLES 31G X 5 MM MISC USE TO INJECT LANTUS ONCE A DAY AND NOVOLOG TWICE DAILY. (TOTAL OF 3 INJECTIONS DAILY) 100 each 2   Cholecalciferol (VITAMIN D3) 10 MCG (400 UNIT) tablet TAKE 2 TABLETS BY MOUTH EVERY DAY 100 tablet 8   [employer] CALCIUM 600 & VITAMIN D3 600-20 MG-MCG TABS TAKE 1 TABLET (600 MG TOTAL) BY MOUTH 2 (TWO) TIMES DAILY WITH A MEAL. 180 tablet 1   fluticasone (FLONASE) 50 MCG/ACT nasal spray SPRAY 2 SPRAYS INTO EACH NOSTRIL EVERY DAY 48 mL 2   gabapentin (NEURONTIN) 300 MG capsule TAKE 2 CAPSULES (600 MG TOTAL) BY MOUTH AT BEDTIME. 60 capsule 6   hydrochlorothiazide (HYDRODIURIL) 12.5 MG tablet Take 1 tablet (12.5 mg total) by mouth daily. Stop Amlodipine 90 tablet 3   insulin aspart (NOVOLOG FLEXPEN) 100 UNIT/ML FlexPen 8 units with breakfast and dinner 15 mL 11   insulin glargine (LANTUS SOLOSTAR) 100 UNIT/ML Solostar Pen Inject 34 Units into the skin at bedtime. 15 mL 6   losartan (COZAAR) 25 MG tablet Take 1 tablet (25 mg total) by mouth daily. 90 tablet 3   silver sulfADIAZINE (SILVADENE) 1 % cream Apply 1 application topically daily. 50 g 0   No current facility-administered medications for this visit.    REVIEW OF SYSTEMS:    10 Point review of Systems was done is negative except as noted above. Somewhat limited due to language barrier despite using interpreter.  PHYSICAL EXAMINATION: ECOG PERFORMANCE STATUS: 2  . Vitals:   05/27/21 1120  BP: (!) 128/52  Pulse: 69  Resp: 18  Temp: 98.1 F (36.7 C)  SpO2: 100%   Filed Weights   05/27/21 1120  Weight: 102 lb 11.2 oz (46.6 kg)   .Body mass index is 20.74 kg/m.  GENERAL:alert, in no acute distress and comfortable SKIN: no acute rashes, no significant lesions EYES: Conjunctival pallor noted OROPHARYNX: MMM, no exudates, no oropharyngeal erythema or ulceration NECK: supple, no JVD LYMPH:  no palpable lymphadenopathy in the cervical, axillary or  inguinal regions LUNGS: clear to auscultation b/l with normal respiratory effort HEART: regular rate & rhythm ABDOMEN:  normoactive bowel sounds , non tender, not distended. Extremity: no pedal edema PSYCH: alert & oriented x 3 with fluent speech NEURO: no focal motor/sensory deficits  LABORATORY DATA:  I have reviewed the data as listed  . CBC Latest Ref Rng & Units 04/14/2021 10/29/2020 05/14/2020  WBC 3.4 - 10.8 x10E3/uL 4.6 4.7 6.5  Hemoglobin 11.1 - 15.9 g/dL 7.4(L) 8.0(L) 9.6(L)  Hematocrit 34.0 - 46.6 % 25.0(L) 25.9(L) 28.9(L)  Platelets 150 - 450 x10E3/uL 89(LL) 100(LL) 108(L)   CBC    Component Value Date/Time   WBC 4.6 04/14/2021 1544   WBC 6.5 05/14/2020 1428   RBC 3.69 (L) 04/14/2021 1544   RBC 4.41 05/14/2020 1428   HGB 7.4 (L) 04/14/2021 1544   HCT 25.0 (L)  04/14/2021 1544   PLT 89 (LL) 04/14/2021 1544   MCV 68 (L) 04/14/2021 1544   MCH 20.1 (L) 04/14/2021 1544   MCH 21.8 (L) 05/14/2020 1428   MCHC 29.6 (L) 04/14/2021 1544   MCHC 33.2 05/14/2020 1428   RDW 16.7 (H) 04/14/2021 1544   LYMPHSABS 1.2 05/14/2020 1428   LYMPHSABS 0.9 05/30/2019 1432   MONOABS 0.4 05/14/2020 1428   EOSABS 0.1 05/14/2020 1428   EOSABS 0.1 05/30/2019 1432   BASOSABS 0.0 05/14/2020 1428   BASOSABS 0.0 05/30/2019 1432    . CMP Latest Ref Rng & Units 04/14/2021 03/13/2021 02/12/2021  Glucose 70 - 99 mg/dL 140(H) 95 61(L)  BUN 8 - 27 mg/dL 43(H) 26 17  Creatinine 0.57 - 1.00 mg/dL 1.13(H) 0.86 0.98  Sodium 134 - 144 mmol/L 134 138 133(L)  Potassium 3.5 - 5.2 mmol/L 5.2 4.1 3.8  Chloride 96 - 106 mmol/L 100 103 93(L)  CO2 20 - 29 mmol/L 24 24 25   Calcium 8.7 - 10.3 mg/dL 9.0 9.7 10.5(H)  Total Protein 6.0 - 8.5 g/dL - - -  Total Bilirubin 0.0 - 1.2 mg/dL - - -  Alkaline Phos 44 - 121 IU/L - - -  AST 0 - 40 IU/L - - -  ALT 0 - 32 IU/L - - -   Component     Latest Ref Rng & Units 05/27/2021  Iron     28 - 170 ug/dL 29  TIBC     250 - 450 ug/dL 297  Saturation Ratios     10.4  - 31.8 % 10 (L)  UIBC     148 - 442 ug/dL 268  Folate, Hemolysate     Not Estab. ng/mL 379.0  HCT     34.0 - 46.6 % 23.4 (L)  Folate, RBC     >498 ng/mL 1,620  DAT, complement      NEG  DAT, IgG      NEG . . Neita Goodnight Platelet Fraction     1.2 - 8.6 % 8.6  Transferrin Receptor     12.2 - 27.3 nmol/L 31.0 (H)  Ferritin     11 - 307 ng/mL 552 (H)  Erythropoietin     2.6 - 18.5 mIU/mL 6.1  Vitamin B12     180 - 914 pg/mL 884  Haptoglobin     42 - 346 mg/dL 72  LDH     98 - 192 U/L 152   Hemoglobinopathy evaluation Order: [id number] Status: Final result    Visible to patient: No (not released)    Next appt: 06/16/2021 at 12:15 PM in Oncology ([facility])    1 Result Note   1 Follow-up Encounter Component Ref Range & Units 4 yr ago   Hemoglobin F Quantitation 0.0 - 2.0 % 5.5 High    Hgb A 96.4 - 98.8 % 0.0 Low    HGB S 0.0 % 0.0   HGB C 0.0 % 0.0   Hemoglobin A2 Quantitation 1.8 - 3.2 % 4.8 High    HGB VARIANT 0.0 % Comment:   Comment: HGB CONSTANT SPRING 0.3%, HGB E 89.4%  HGB INTERPRETATION  Comment   Comment: Hemoglobin pattern and concentrations are consistent with a homozygous  E patient. Suggest clinical and hematologic correlation.           Homozygous E Interpretation Ranges           Hgb F   0.0 - 15.0%  Hgb E        >80.0%           Hgb A2       > 5.0%  Hemoglobin pattern and concentrations are consistent with Hgb  Constant Spring.      Component     Latest Ref Rng & Units 05/27/2021  Iron     28 - 170 ug/dL 29  TIBC     250 - 450 ug/dL 297  Saturation Ratios     10.4 - 31.8 % 10 (L)  UIBC     148 - 442 ug/dL 268  Folate, Hemolysate     Not Estab. ng/mL 379.0  HCT     34.0 - 46.6 % 23.4 (L)  Folate, RBC     >498 ng/mL 1,620  DAT, complement      NEG  DAT, IgG      NEG . . Neita Goodnight Platelet Fraction     1.2 - 8.6 % 8.6  Transferrin Receptor     12.2 - 27.3 nmol/L 31.0 (H)  Ferritin     11 - 307 ng/mL 552 (H)   Erythropoietin     2.6 - 18.5 mIU/mL 6.1  Vitamin B12     180 - 914 pg/mL 884  Haptoglobin     42 - 346 mg/dL 72  LDH     98 - 192 U/L 152    RADIOGRAPHIC STUDIES: I have personally reviewed the radiological images as listed and agreed with the findings in the report. No results found.  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 Her baseline hemoglobin fluctuates between about 7.5-9.5. PLAN -Patient's hemoglobin today is 7.3 which is not much changed over the last 2 months.  In November her hemoglobin was 7.4. -She is not having much symptoms of symptomatic anemia to warrant emergent transfusion at this time. -We discussed that her known hemoglobinopathy is primarily driving her chronic microcytic hypochromic anemia and her baseline hemoglobin is likely in the 7.5-9.5 range. -We discussed several factors could make her anemia somewhat worse including intercurrent infections slowing down the bone marrow, any etiology of chronic inflammation, chronic kidney disease, vitamin deficiencies especially [id number] and folic acid, iron deficiency, medication suppressing the bone marrow etc.. -We discussed that her other family members should also be evaluated for hemoglobinopathies since they would have inherited at least trait from her. -Patient denies any overt febrile illnesses recently.  She has had a history of previously treated TB in 2017.  Uncertain if this might have also hit her bone marrow(from an infection standpoint as well as anti-TB medications) -We did extensive work-up today to evaluate additional factors causing anemia. -LDH and haptoglobin levels are normal suggesting against overt uncontrolled intravascular hemolysis at this time. -Current B12 and RBC folate levels are within normal limits but she was recommended to take B complex and folic acid 1 mg  daily over-the-counter to support accelerated hematopoiesis. -Normal LDH suggest against ineffective erythropoiesis from B12 deficiency or any significant element of myelofibrosis. -Erythropoietin levels are somewhat lower than expected suggesting decreased renal responsiveness or something positive chronic inflammation. -Her ferritin levels are also elevated but iron saturation is only 10% and transferrin receptor levels are elevated suggesting functional iron deficiency.  2) chronic thrombocytopenia could be related to her hemoglobinopathy due to hypersplenism triggered by chronic analysis. Cannot rule out mild ITP. PLan -Would  recommend ultrasound of the abdomen to evaluate for liver cirrhosis and splenomegaly. -Avoid medications that would cause thrombocytopenia. -No indication for treatment at this time -B complex might address any subtle B vitamin deficiencies that might arise.  Follow-up Labs today Return to clinic with Dr. [name] in 7 to 10 days to discuss lab results and with appointment for 1 unit of PRBC transfusion if needed  All of the patients questions were answered with apparent satisfaction. The patient knows to call the clinic with any problems, questions or concerns.  I spent 45 minutes counseling the patient face to face. The total time spent in the appointment was 60 minutes and more than 50% was on counseling and direct patient cares.     Sullivan Lone MD MS AAHIVMS [facility] Hematology/Oncology Physician [facility]  (Office):       [phone] (Work cell):  [phone] (Fax):           [phone]  05/27/2021 11:22 AM  Addendum CMP showed hyponatremia. Patient was admitted to the hospital on 05/28/2021 with headaches and abdominal pain noted to have COVID-19 infection and dehydration causing hyponatremia.  Her appointment has been moved to 06/15/2021

## 2021-05-27 NOTE — Progress Notes (Signed)
° °  S:    PCP: Dr. Wynetta Emery  Patient presents to the clinic for hypertension evaluation, counseling, and management. Patient seen by Dr. Wynetta Emery on 04/14/21 and BP was 168/79. Losartan was increased. At hemo/onc appt yesterday, BP was at goal and BMET showed Scr stable from previous and electrolytes wnl.   Today, patient presents accompanied by her son and an in person [facility] interpreter. She ambulates with use of a cane. She reports that for the past three days she has felt nauseous, throwing up, stomach upset, and headache. She has become more tired since yesterday. Reports abdominal pain is radiating to her back and endorses SOB. She is constantly moaning throughout the visit. Her interpreter has worked with her for a quite a while and reports this is not her baseline.   She is taking her BP medications as prescribed and has taken them today.   Current BP Medications include:  losartan 25 mg daily, HCTZ 12.5 mg daily  Previous BP Medications tried: amlodipine (LEE)   Family / Social history: non-smoker  O:  Vitals:   05/28/21 1534  BP: 121/74  Pulse: 66    Last 3 Office BP readings: BP Readings from Last 3 Encounters:  05/28/21 121/74  05/27/21 (!) 128/52  04/14/21 (!) 168/79    BMET    Component Value Date/Time   NA 126 (L) 05/27/2021 1200   NA 134 04/14/2021 1544   K 4.0 05/27/2021 1200   CL 94 (L) 05/27/2021 1200   CO2 24 05/27/2021 1200   GLUCOSE 140 (H) 05/27/2021 1200   BUN 46 (H) 05/27/2021 1200   BUN 43 (H) 04/14/2021 1544   CREATININE 1.21 (H) 05/27/2021 1200   CREATININE 0.66 06/19/2016 0838   CALCIUM 8.3 (L) 05/27/2021 1200   GFRNONAA 47 (L) 05/27/2021 1200   GFRNONAA >89 12/30/2015 1210   GFRAA 61 08/31/2019 1143   GFRAA >89 12/30/2015 1210    Renal function: Estimated Creatinine Clearance: 28.2 mL/min (A) (by C-G formula based on SCr of 1.21 mg/dL (H)).  Clinical ASCVD: No  The 10-year ASCVD risk score (Arnett DK, et al., 2019) is:  26.5%   Values used to calculate the score:     Age: 76 years     Sex: Female     Is Non-Hispanic African American: No     Diabetic: Yes     Tobacco smoker: No     Systolic Blood Pressure: 123XX123 mmHg     Is BP treated: Yes     HDL Cholesterol: 43 mg/dL     Total Cholesterol: 133 mg/dL   A/P: Hypertension longstanding currently at goal on current medications. Medication adherence reported. BP Goal = < 130/80 mmHg.  -Continue current medications -Counseled on lifestyle modifications for blood pressure control including reduced dietary sodium, increased exercise, adequate sleep.  Due to severity of abdominal pain, patient endorsing SOB, and apparent distress, discussed patient with Geryl Rankins NP who recommended patient present to the ED to be evaluated.   Total time in face-to-face counseling 15 minutes. F/U Clinic Visit with PCP in February.    Rebbeca Paul, PharmD PGY2 Ambulatory Care Pharmacy Resident 05/28/2021 3:42 PM

## 2021-05-28 ENCOUNTER — Encounter: Payer: Self-pay | Admitting: Pharmacist

## 2021-05-28 ENCOUNTER — Inpatient Hospital Stay (HOSPITAL_COMMUNITY)
Admission: EM | Admit: 2021-05-28 | Discharge: 2021-06-02 | DRG: 640 | Disposition: A | Discharge status: Home / Self Care | Payer: Medicare Other | Attending: Internal Medicine | Admitting: Internal Medicine

## 2021-05-28 ENCOUNTER — Ambulatory Visit: Payer: Medicare Other | Attending: Internal Medicine | Admitting: Pharmacist

## 2021-05-28 ENCOUNTER — Other Ambulatory Visit: Payer: Self-pay

## 2021-05-28 ENCOUNTER — Emergency Department (HOSPITAL_COMMUNITY): Payer: Medicare Other

## 2021-05-28 ENCOUNTER — Encounter (HOSPITAL_COMMUNITY): Payer: Self-pay

## 2021-05-28 VITALS — BP 121/74 | HR 66

## 2021-05-28 DIAGNOSIS — I10 Essential (primary) hypertension: Secondary | ICD-10-CM

## 2021-05-28 DIAGNOSIS — U071 COVID-19: Secondary | ICD-10-CM | POA: Diagnosis present

## 2021-05-28 DIAGNOSIS — K219 Gastro-esophageal reflux disease without esophagitis: Secondary | ICD-10-CM | POA: Diagnosis present

## 2021-05-28 DIAGNOSIS — Z8249 Family history of ischemic heart disease and other diseases of the circulatory system: Secondary | ICD-10-CM

## 2021-05-28 DIAGNOSIS — E860 Dehydration: Principal | ICD-10-CM | POA: Diagnosis present

## 2021-05-28 DIAGNOSIS — R4182 Altered mental status, unspecified: Secondary | ICD-10-CM | POA: Diagnosis present

## 2021-05-28 DIAGNOSIS — D61818 Other pancytopenia: Secondary | ICD-10-CM | POA: Diagnosis present

## 2021-05-28 DIAGNOSIS — I1310 Hypertensive heart and chronic kidney disease without heart failure, with stage 1 through stage 4 chronic kidney disease, or unspecified chronic kidney disease: Secondary | ICD-10-CM | POA: Diagnosis present

## 2021-05-28 DIAGNOSIS — R112 Nausea with vomiting, unspecified: Secondary | ICD-10-CM | POA: Diagnosis present

## 2021-05-28 DIAGNOSIS — N1831 Chronic kidney disease, stage 3a: Secondary | ICD-10-CM | POA: Diagnosis present

## 2021-05-28 DIAGNOSIS — Z90710 Acquired absence of both cervix and uterus: Secondary | ICD-10-CM

## 2021-05-28 DIAGNOSIS — E1165 Type 2 diabetes mellitus with hyperglycemia: Secondary | ICD-10-CM

## 2021-05-28 DIAGNOSIS — N179 Acute kidney failure, unspecified: Secondary | ICD-10-CM | POA: Diagnosis not present

## 2021-05-28 DIAGNOSIS — M6281 Muscle weakness (generalized): Secondary | ICD-10-CM | POA: Diagnosis present

## 2021-05-28 DIAGNOSIS — Z9049 Acquired absence of other specified parts of digestive tract: Secondary | ICD-10-CM

## 2021-05-28 DIAGNOSIS — R5382 Chronic fatigue, unspecified: Secondary | ICD-10-CM | POA: Diagnosis present

## 2021-05-28 DIAGNOSIS — E1149 Type 2 diabetes mellitus with other diabetic neurological complication: Secondary | ICD-10-CM | POA: Diagnosis present

## 2021-05-28 DIAGNOSIS — R945 Abnormal results of liver function studies: Secondary | ICD-10-CM | POA: Diagnosis present

## 2021-05-28 DIAGNOSIS — R0602 Shortness of breath: Secondary | ICD-10-CM | POA: Diagnosis present

## 2021-05-28 DIAGNOSIS — E11649 Type 2 diabetes mellitus with hypoglycemia without coma: Secondary | ICD-10-CM | POA: Diagnosis not present

## 2021-05-28 DIAGNOSIS — E1122 Type 2 diabetes mellitus with diabetic chronic kidney disease: Secondary | ICD-10-CM | POA: Diagnosis present

## 2021-05-28 DIAGNOSIS — Z79899 Other long term (current) drug therapy: Secondary | ICD-10-CM

## 2021-05-28 DIAGNOSIS — M1990 Unspecified osteoarthritis, unspecified site: Secondary | ICD-10-CM | POA: Diagnosis present

## 2021-05-28 DIAGNOSIS — Z888 Allergy status to other drugs, medicaments and biological substances status: Secondary | ICD-10-CM

## 2021-05-28 DIAGNOSIS — Z794 Long term (current) use of insulin: Secondary | ICD-10-CM

## 2021-05-28 DIAGNOSIS — R1013 Epigastric pain: Secondary | ICD-10-CM | POA: Diagnosis present

## 2021-05-28 DIAGNOSIS — E871 Hypo-osmolality and hyponatremia: Secondary | ICD-10-CM | POA: Diagnosis present

## 2021-05-28 LAB — COMPREHENSIVE METABOLIC PANEL
ALT: 71 U/L — ABNORMAL HIGH (ref 0–44)
AST: 62 U/L — ABNORMAL HIGH (ref 15–41)
Albumin: 3.1 g/dL — ABNORMAL LOW (ref 3.5–5.0)
Alkaline Phosphatase: 63 U/L (ref 38–126)
Anion gap: 8 (ref 5–15)
BUN: 32 mg/dL — ABNORMAL HIGH (ref 8–23)
CO2: 23 mmol/L (ref 22–32)
Calcium: 8.6 mg/dL — ABNORMAL LOW (ref 8.9–10.3)
Chloride: 91 mmol/L — ABNORMAL LOW (ref 98–111)
Creatinine, Ser: 1.01 mg/dL — ABNORMAL HIGH (ref 0.44–1.00)
GFR, Estimated: 59 mL/min — ABNORMAL LOW (ref >60)
Glucose, Bld: 192 mg/dL — ABNORMAL HIGH (ref 70–99)
Potassium: 4.2 mmol/L (ref 3.5–5.1)
Sodium: 122 mmol/L — ABNORMAL LOW (ref 135–145)
Total Bilirubin: 0.8 mg/dL (ref 0.3–1.2)
Total Protein: 6.9 g/dL (ref 6.5–8.1)

## 2021-05-28 LAB — ERYTHROPOIETIN: Erythropoietin: 6.1 m[IU]/mL (ref 2.6–18.5)

## 2021-05-28 LAB — TROPONIN I (HIGH SENSITIVITY): Troponin I (High Sensitivity): 4 ng/L (ref <18)

## 2021-05-28 LAB — SOLUBLE TRANSFERRIN RECEPTOR: Transferrin Receptor: 31 nmol/L — ABNORMAL HIGH (ref 12.2–27.3)

## 2021-05-28 LAB — CBC WITH DIFFERENTIAL/PLATELET
Abs Immature Granulocytes: 0 10*3/uL (ref 0.00–0.07)
Basophils Absolute: 0 10*3/uL (ref 0.0–0.1)
Basophils Relative: 0 %
Eosinophils Absolute: 0 10*3/uL (ref 0.0–0.5)
Eosinophils Relative: 1 %
HCT: 23.1 % — ABNORMAL LOW (ref 36.0–46.0)
Hemoglobin: 7.3 g/dL — ABNORMAL LOW (ref 12.0–15.0)
Immature Granulocytes: 0 %
Lymphocytes Relative: 38 %
Lymphs Abs: 1 10*3/uL (ref 0.7–4.0)
MCH: 19.9 pg — ABNORMAL LOW (ref 26.0–34.0)
MCHC: 31.6 g/dL (ref 30.0–36.0)
MCV: 63.1 fL — ABNORMAL LOW (ref 80.0–100.0)
Monocytes Absolute: 0.3 10*3/uL (ref 0.1–1.0)
Monocytes Relative: 10 %
Neutro Abs: 1.3 10*3/uL — ABNORMAL LOW (ref 1.7–7.7)
Neutrophils Relative %: 51 %
Platelets: 79 10*3/uL — ABNORMAL LOW (ref 150–400)
RBC: 3.66 MIL/uL — ABNORMAL LOW (ref 3.87–5.11)
RDW: 16 % — ABNORMAL HIGH (ref 11.5–15.5)
WBC: 2.5 10*3/uL — ABNORMAL LOW (ref 4.0–10.5)
nRBC: 0 % (ref 0.0–0.2)

## 2021-05-28 LAB — HAPTOGLOBIN: Haptoglobin: 72 mg/dL (ref 42–346)

## 2021-05-28 LAB — FOLATE RBC
Folate, Hemolysate: 379 ng/mL
Folate, RBC: 1620 ng/mL (ref >498)
Hematocrit: 23.4 % — ABNORMAL LOW (ref 34.0–46.6)

## 2021-05-28 NOTE — ED Provider Triage Note (Signed)
Emergency Medicine Provider Triage Evaluation Note  Olivia Williamson , a 76 y.o. female  was evaluated in triage.  Pt complains of  headache  Pt's family reports pt has been moaning   Review of Systems  Positive: Heart racing Negative: fever  Physical Exam  Ht 4\' 11"  (1.499 m)    Wt 46.3 kg    LMP 05/11/2016    BMI 20.60 kg/m  Gen:   Awake, no distress   Resp:  Normal effort  MSK:   Moves extremities without difficulty  Other:    Medical Decision Making  Medically screening exam initiated at 4:11 PM.  Appropriate orders placed.  Adelayde Minney was informed that the remainder of the evaluation will be completed by another provider, this initial triage assessment does not replace that evaluation, and the importance of remaining in the ED until their evaluation is complete.     Yehuda Budd, Elson Areas 05/28/21 1612

## 2021-05-28 NOTE — ED Triage Notes (Signed)
Family reports patient was last seen well 3 days ago.  Patient is moaning in triage and complaiing of headache difficulty walking n/v

## 2021-05-29 ENCOUNTER — Inpatient Hospital Stay (HOSPITAL_COMMUNITY): Payer: Medicare Other

## 2021-05-29 ENCOUNTER — Encounter (HOSPITAL_COMMUNITY): Payer: Self-pay | Admitting: Internal Medicine

## 2021-05-29 ENCOUNTER — Emergency Department (HOSPITAL_COMMUNITY): Payer: Medicare Other

## 2021-05-29 DIAGNOSIS — Z888 Allergy status to other drugs, medicaments and biological substances status: Secondary | ICD-10-CM | POA: Diagnosis not present

## 2021-05-29 DIAGNOSIS — E871 Hypo-osmolality and hyponatremia: Secondary | ICD-10-CM | POA: Diagnosis present

## 2021-05-29 DIAGNOSIS — R4182 Altered mental status, unspecified: Secondary | ICD-10-CM | POA: Diagnosis not present

## 2021-05-29 DIAGNOSIS — Z9049 Acquired absence of other specified parts of digestive tract: Secondary | ICD-10-CM | POA: Diagnosis not present

## 2021-05-29 DIAGNOSIS — Z8249 Family history of ischemic heart disease and other diseases of the circulatory system: Secondary | ICD-10-CM | POA: Diagnosis not present

## 2021-05-29 DIAGNOSIS — N179 Acute kidney failure, unspecified: Secondary | ICD-10-CM | POA: Diagnosis not present

## 2021-05-29 DIAGNOSIS — N1831 Chronic kidney disease, stage 3a: Secondary | ICD-10-CM | POA: Diagnosis not present

## 2021-05-29 DIAGNOSIS — R112 Nausea with vomiting, unspecified: Secondary | ICD-10-CM | POA: Diagnosis not present

## 2021-05-29 DIAGNOSIS — K219 Gastro-esophageal reflux disease without esophagitis: Secondary | ICD-10-CM | POA: Diagnosis not present

## 2021-05-29 DIAGNOSIS — R0602 Shortness of breath: Secondary | ICD-10-CM | POA: Diagnosis not present

## 2021-05-29 DIAGNOSIS — R945 Abnormal results of liver function studies: Secondary | ICD-10-CM | POA: Diagnosis not present

## 2021-05-29 DIAGNOSIS — M1990 Unspecified osteoarthritis, unspecified site: Secondary | ICD-10-CM | POA: Diagnosis not present

## 2021-05-29 DIAGNOSIS — D61818 Other pancytopenia: Secondary | ICD-10-CM | POA: Diagnosis not present

## 2021-05-29 DIAGNOSIS — Z90710 Acquired absence of both cervix and uterus: Secondary | ICD-10-CM | POA: Diagnosis not present

## 2021-05-29 DIAGNOSIS — I1310 Hypertensive heart and chronic kidney disease without heart failure, with stage 1 through stage 4 chronic kidney disease, or unspecified chronic kidney disease: Secondary | ICD-10-CM | POA: Diagnosis not present

## 2021-05-29 DIAGNOSIS — Z79899 Other long term (current) drug therapy: Secondary | ICD-10-CM | POA: Diagnosis not present

## 2021-05-29 DIAGNOSIS — R1013 Epigastric pain: Secondary | ICD-10-CM | POA: Diagnosis not present

## 2021-05-29 DIAGNOSIS — U071 COVID-19: Secondary | ICD-10-CM | POA: Diagnosis not present

## 2021-05-29 DIAGNOSIS — E1149 Type 2 diabetes mellitus with other diabetic neurological complication: Secondary | ICD-10-CM | POA: Diagnosis not present

## 2021-05-29 DIAGNOSIS — E860 Dehydration: Secondary | ICD-10-CM | POA: Diagnosis not present

## 2021-05-29 DIAGNOSIS — E11649 Type 2 diabetes mellitus with hypoglycemia without coma: Secondary | ICD-10-CM | POA: Diagnosis not present

## 2021-05-29 DIAGNOSIS — E1122 Type 2 diabetes mellitus with diabetic chronic kidney disease: Secondary | ICD-10-CM | POA: Diagnosis not present

## 2021-05-29 DIAGNOSIS — Z794 Long term (current) use of insulin: Secondary | ICD-10-CM | POA: Diagnosis not present

## 2021-05-29 DIAGNOSIS — R5382 Chronic fatigue, unspecified: Secondary | ICD-10-CM | POA: Diagnosis not present

## 2021-05-29 LAB — URINALYSIS, ROUTINE W REFLEX MICROSCOPIC
Bacteria, UA: NONE SEEN
Bilirubin Urine: NEGATIVE
Glucose, UA: NEGATIVE mg/dL
Ketones, ur: NEGATIVE mg/dL
Leukocytes,Ua: NEGATIVE
Nitrite: NEGATIVE
Protein, ur: NEGATIVE mg/dL
Specific Gravity, Urine: 1.018 (ref 1.005–1.030)
pH: 5 (ref 5.0–8.0)

## 2021-05-29 LAB — CBG MONITORING, ED
Glucose-Capillary: 174 mg/dL — ABNORMAL HIGH (ref 70–99)
Glucose-Capillary: 197 mg/dL — ABNORMAL HIGH (ref 70–99)
Glucose-Capillary: 293 mg/dL — ABNORMAL HIGH (ref 70–99)
Glucose-Capillary: 123 mg/dL — ABNORMAL HIGH (ref 70–99)

## 2021-05-29 LAB — PROCALCITONIN: Procalcitonin: <0.1 ng/mL

## 2021-05-29 LAB — RESP PANEL BY RT-PCR (FLU A&B, COVID) ARPGX2
Influenza A by PCR: NEGATIVE
Influenza B by PCR: NEGATIVE
SARS Coronavirus 2 by RT PCR: POSITIVE — AB

## 2021-05-29 LAB — C-REACTIVE PROTEIN: CRP: 0.5 mg/dL (ref <1.0)

## 2021-05-29 LAB — FIBRINOGEN: Fibrinogen: 281 mg/dL (ref 210–475)

## 2021-05-29 LAB — LACTATE DEHYDROGENASE: LDH: 163 U/L (ref 98–192)

## 2021-05-29 LAB — D-DIMER, QUANTITATIVE: D-Dimer, Quant: 0.53 ug/mL-FEU — ABNORMAL HIGH (ref 0.00–0.50)

## 2021-05-29 LAB — FERRITIN: Ferritin: 384 ng/mL — ABNORMAL HIGH (ref 11–307)

## 2021-05-29 LAB — LIPASE, BLOOD: Lipase: 35 U/L (ref 11–51)

## 2021-05-29 MED ORDER — SODIUM CHLORIDE 0.9 % IV BOLUS
1000.0000 mL | Freq: Once | INTRAVENOUS | Status: AC
  Administered 2021-05-29: 1000 mL via INTRAVENOUS

## 2021-05-29 MED ORDER — BISACODYL 5 MG PO TBEC: 5.0000 mg | DELAYED_RELEASE_TABLET | Freq: Every day | ORAL | Status: DC | PRN

## 2021-05-29 MED ORDER — SODIUM CHLORIDE 0.9 % IV SOLN
6.2500 mg | Freq: Once | INTRAVENOUS | Status: AC
  Administered 2021-05-29: 6.25 mg via INTRAVENOUS
  Filled 2021-05-29: qty 0.25

## 2021-05-29 MED ORDER — IOHEXOL 300 MG/ML  SOLN
75.0000 mL | Freq: Once | INTRAMUSCULAR | Status: AC | PRN
  Administered 2021-05-29: 75 mL via INTRAVENOUS

## 2021-05-29 MED ORDER — FLEET ENEMA 7-19 GM/118ML RE ENEM
1.0000 | ENEMA | Freq: Once | RECTAL | Status: DC | PRN
  Filled 2021-05-29: qty 1

## 2021-05-29 MED ORDER — MORPHINE SULFATE (PF) 2 MG/ML IV SOLN
2.0000 mg | Freq: Once | INTRAVENOUS | Status: AC
  Administered 2021-05-29: 2 mg via INTRAVENOUS
  Filled 2021-05-29: qty 1

## 2021-05-29 MED ORDER — METHYLPREDNISOLONE SODIUM SUCC 40 MG IJ SOLR
0.5000 mg/kg | Freq: Two times a day (BID) | INTRAMUSCULAR | Status: DC
  Administered 2021-05-29 (×2): 23.2 mg via INTRAVENOUS
  Filled 2021-05-29 (×2): qty 1

## 2021-05-29 MED ORDER — GUAIFENESIN-DM 100-10 MG/5ML PO SYRP
10.0000 mL | ORAL_SOLUTION | ORAL | Status: DC | PRN
  Administered 2021-05-31: 10 mL via ORAL
  Filled 2021-05-29: qty 10

## 2021-05-29 MED ORDER — ALBUTEROL SULFATE HFA 108 (90 BASE) MCG/ACT IN AERS
2.0000 | INHALATION_SPRAY | RESPIRATORY_TRACT | Status: DC | PRN
  Filled 2021-05-29: qty 6.7

## 2021-05-29 MED ORDER — SODIUM CHLORIDE 0.9% FLUSH
3.0000 mL | Freq: Two times a day (BID) | INTRAVENOUS | Status: DC
  Administered 2021-05-29: 3 mL via INTRAVENOUS

## 2021-05-29 MED ORDER — SODIUM CHLORIDE 0.9% FLUSH: 3.0000 mL | INTRAVENOUS | Status: DC | PRN

## 2021-05-29 MED ORDER — ACETAMINOPHEN 325 MG PO TABS
650.0000 mg | ORAL_TABLET | Freq: Four times a day (QID) | ORAL | Status: DC | PRN
  Administered 2021-06-01: 650 mg via ORAL
  Filled 2021-05-29: qty 2

## 2021-05-29 MED ORDER — SODIUM CHLORIDE 0.9 % IV SOLN: 250.0000 mL | INTRAVENOUS | Status: DC | PRN

## 2021-05-29 MED ORDER — OXYCODONE HCL 5 MG PO TABS: 5.0000 mg | ORAL_TABLET | ORAL | Status: DC | PRN

## 2021-05-29 MED ORDER — INSULIN ASPART 100 UNIT/ML IJ SOLN: 0.0000 [IU] | Freq: Every day | INTRAMUSCULAR | Status: DC

## 2021-05-29 MED ORDER — POLYETHYLENE GLYCOL 3350 17 G PO PACK: 17.0000 g | PACK | Freq: Every day | ORAL | Status: DC | PRN

## 2021-05-29 MED ORDER — NIRMATRELVIR/RITONAVIR (PAXLOVID) TABLET (RENAL DOSING)
2.0000 | ORAL_TABLET | Freq: Two times a day (BID) | ORAL | Status: DC
  Administered 2021-05-29 – 2021-06-02 (×9): 2 via ORAL
  Filled 2021-05-29: qty 20

## 2021-05-29 MED ORDER — ASCORBIC ACID 500 MG PO TABS
500.0000 mg | ORAL_TABLET | Freq: Every day | ORAL | Status: DC
  Administered 2021-05-30 – 2021-06-02 (×4): 500 mg via ORAL
  Filled 2021-05-29 (×4): qty 1

## 2021-05-29 MED ORDER — NIRMATRELVIR/RITONAVIR (PAXLOVID)TABLET: 3.0000 | ORAL_TABLET | Freq: Two times a day (BID) | ORAL | Status: DC

## 2021-05-29 MED ORDER — ZINC SULFATE 220 (50 ZN) MG PO CAPS
220.0000 mg | ORAL_CAPSULE | Freq: Every day | ORAL | Status: DC
  Administered 2021-05-30 – 2021-06-02 (×4): 220 mg via ORAL
  Filled 2021-05-29 (×4): qty 1

## 2021-05-29 MED ORDER — ONDANSETRON HCL 4 MG/2ML IJ SOLN
4.0000 mg | Freq: Four times a day (QID) | INTRAMUSCULAR | Status: DC | PRN
  Administered 2021-06-01: 4 mg via INTRAVENOUS
  Filled 2021-05-29: qty 2

## 2021-05-29 MED ORDER — ONDANSETRON HCL 4 MG/2ML IJ SOLN
4.0000 mg | Freq: Once | INTRAMUSCULAR | Status: AC
Start: 2021-05-29 — End: 2021-05-29
  Administered 2021-05-29: 4 mg via INTRAVENOUS
  Filled 2021-05-29: qty 2

## 2021-05-29 MED ORDER — ONDANSETRON HCL 4 MG PO TABS: 4.0000 mg | ORAL_TABLET | Freq: Four times a day (QID) | ORAL | Status: DC | PRN

## 2021-05-29 MED ORDER — LOSARTAN POTASSIUM 25 MG PO TABS
25.0000 mg | ORAL_TABLET | Freq: Every day | ORAL | Status: DC
  Administered 2021-05-29 – 2021-05-31 (×3): 25 mg via ORAL
  Filled 2021-05-29 (×3): qty 1

## 2021-05-29 MED ORDER — LACTATED RINGERS IV SOLN: INTRAVENOUS | Status: DC

## 2021-05-29 MED ORDER — HYDROCOD POLST-CPM POLST ER 10-8 MG/5ML PO SUER: 5.0000 mL | Freq: Two times a day (BID) | ORAL | Status: DC | PRN

## 2021-05-29 MED ORDER — MORPHINE SULFATE (PF) 2 MG/ML IV SOLN
2.0000 mg | Freq: Once | INTRAVENOUS | Status: DC
  Filled 2021-05-29: qty 1

## 2021-05-29 MED ORDER — ENOXAPARIN SODIUM 40 MG/0.4ML IJ SOSY
40.0000 mg | PREFILLED_SYRINGE | INTRAMUSCULAR | Status: DC
  Administered 2021-05-29: 40 mg via SUBCUTANEOUS
  Filled 2021-05-29: qty 0.4

## 2021-05-29 MED ORDER — INSULIN GLARGINE-YFGN 100 UNIT/ML ~~LOC~~ SOLN
34.0000 [IU] | Freq: Every day | SUBCUTANEOUS | Status: DC
  Administered 2021-05-29 – 2021-05-31 (×3): 34 [IU] via SUBCUTANEOUS
  Filled 2021-05-29 (×4): qty 0.34

## 2021-05-29 MED ORDER — INSULIN ASPART 100 UNIT/ML IJ SOLN
0.0000 [IU] | Freq: Three times a day (TID) | INTRAMUSCULAR | Status: DC
  Administered 2021-05-29: 2 [IU] via SUBCUTANEOUS
  Administered 2021-05-29: 5 [IU] via SUBCUTANEOUS
  Administered 2021-05-30: 7 [IU] via SUBCUTANEOUS
  Administered 2021-05-31 (×2): 3 [IU] via SUBCUTANEOUS
  Administered 2021-06-01: 5 [IU] via SUBCUTANEOUS

## 2021-05-29 MED ORDER — DOCUSATE SODIUM 100 MG PO CAPS
100.0000 mg | ORAL_CAPSULE | Freq: Two times a day (BID) | ORAL | Status: DC
  Filled 2021-05-29: qty 1

## 2021-05-29 MED ORDER — PREDNISONE 20 MG PO TABS: 50.0000 mg | ORAL_TABLET | Freq: Every day | ORAL | Status: DC

## 2021-05-29 NOTE — Assessment & Plan Note (Signed)
-  Etiology appears to be hypovolemic hyponatremia; patient with COVID plus n/v and decreased PO intake. -She appears to be stable at this time -Will gently rehydrate and recheck BMP in AM since Na++ is >120

## 2021-05-29 NOTE — ED Provider Notes (Addendum)
[place] EMERGENCY DEPARTMENT Provider Note   CSN: [id number] Arrival date & time: 05/28/21  1547     History  Chief Complaint  Patient presents with   Altered Mental Status    Olivia Williamson is a 76 y.o. female with history of hypertension, diabetes mellitus, chronic anemia, hemoglobin E, tuberculosis, GERD.  Presents to the emergency department with a chief complaint of headache and abdominal pain.  States that she has had the symptoms over the last 2 to 3 days.  Patient reports that abdominal pain is located to the epigastric area.  Pain has been constant and progressively worsening.  Patient states that pain radiates through to her back.  Patient unable to specifically give her pain, however states that it is bothering her a lot.  Patient endorses associated nausea.  Patient reports that pain is located throughout her head.  Pain has been constant over the last 2 to 3 days.  Patient endorses associated dizziness.  Denies any numbness, weakness, visual disturbance.  Denies any recent falls or injuries.  Patient denies any fever, chills, vomiting, constipation, diarrhea, melena, blood in stool, dysuria, hematuria.   Altered Mental Status Presenting symptoms: no confusion   Associated symptoms: abdominal pain, headaches and nausea   Associated symptoms: no fever, no light-headedness, no rash, no seizures, no vomiting and no weakness       Home Medications Prior to Admission medications   Medication Sig Start Date End Date Taking? Authorizing Provider  B-D UF III MINI PEN NEEDLES 31G X 5 MM MISC USE TO INJECT LANTUS ONCE A DAY AND NOVOLOG TWICE DAILY. (TOTAL OF 3 INJECTIONS DAILY) 03/08/21   [name], MD  Cholecalciferol (VITAMIN D3) 10 MCG (400 UNIT) tablet TAKE 2 TABLETS BY MOUTH EVERY DAY 04/16/21   [name], MD  [employer] CALCIUM 600 & VITAMIN D3 600-20 MG-MCG TABS TAKE 1 TABLET (600 MG TOTAL) BY MOUTH 2 (TWO) TIMES DAILY WITH A MEAL. 05/26/21    [name], MD  fluticasone [facility]) 50 MCG/ACT nasal spray SPRAY 2 SPRAYS INTO EACH NOSTRIL EVERY DAY 04/20/21   [name], MD  gabapentin (NEURONTIN) 300 MG capsule TAKE 2 CAPSULES (600 MG TOTAL) BY MOUTH AT BEDTIME. 09/06/19   [name], MD  hydrochlorothiazide (HYDRODIURIL) 12.5 MG tablet Take 1 tablet (12.5 mg total) by mouth daily. Stop Amlodipine 03/13/21   [name], MD  insulin aspart (NOVOLOG FLEXPEN) 100 UNIT/ML FlexPen 8 units with breakfast and dinner 03/14/20   [name], MD  insulin glargine (LANTUS SOLOSTAR) 100 UNIT/ML Solostar Pen Inject 34 Units into the skin at bedtime. 04/14/21   [name], MD  losartan (COZAAR) 25 MG tablet Take 1 tablet (25 mg total) by mouth daily. 04/14/21   [name], MD  silver sulfADIAZINE (SILVADENE) 1 % cream Apply 1 application topically daily. 06/27/20   Trula Slade, DPM      Allergies    Shrimp [shellfish allergy], Beef-derived products, Fruit & vegetable daily [nutritional supplements], Metformin and related, and Norvasc [amlodipine]    Review of Systems   Review of Systems  Constitutional:  Negative for chills and fever.  Eyes:  Negative for visual disturbance.  Respiratory:  Negative for shortness of breath.   Cardiovascular:  Negative for chest pain.  Gastrointestinal:  Positive for abdominal pain and nausea. Negative for abdominal distention, anal bleeding, blood in stool, constipation, diarrhea, rectal pain and vomiting.  Genitourinary:  Negative for difficulty urinating, dysuria  and hematuria.  Musculoskeletal:  Negative for back pain and neck pain.  Skin:  Negative for color change and rash.  Neurological:  Positive for dizziness and headaches. Negative for seizures, syncope, facial asymmetry, weakness, light-headedness and numbness.  Psychiatric/Behavioral:  Negative for confusion.    Physical Exam Updated Vital Signs BP (!) 108/48 (BP Location: Left Arm)    Pulse  61    Temp 98.6 F (37 C) (Oral)    Resp 20    Ht 4\' 11"  (1.499 m)    Wt 46.3 kg    LMP 05/11/2016    SpO2 100%    BMI 20.60 kg/m  Physical Exam Vitals and nursing note reviewed.  Constitutional:      General: She is not in acute distress.    Appearance: She is not ill-appearing, toxic-appearing or diaphoretic.  HENT:     Head: Normocephalic. No raccoon eyes, Battle's sign, abrasion, contusion, right periorbital erythema, left periorbital erythema or laceration.  Eyes:     General: No scleral icterus.       Right eye: No discharge.        Left eye: No discharge.     Extraocular Movements: Extraocular movements intact.     Conjunctiva/sclera: Conjunctivae normal.     Pupils: Pupils are equal, round, and reactive to light.  Cardiovascular:     Rate and Rhythm: Normal rate.  Pulmonary:     Effort: Pulmonary effort is normal. No tachypnea or bradypnea.     Breath sounds: Normal breath sounds. No stridor. No wheezing, rhonchi or rales.  Abdominal:     General: Abdomen is flat. Bowel sounds are normal. There is no distension. There are no signs of injury.     Palpations: Abdomen is soft. There is no mass or pulsatile mass.     Tenderness: There is abdominal tenderness in the epigastric area and periumbilical area. There is no right CVA tenderness, left CVA tenderness, guarding or rebound.  Musculoskeletal:     Cervical back: Normal range of motion and neck supple. No edema, erythema, signs of trauma, rigidity, torticollis or crepitus. No pain with movement, spinous process tenderness or muscular tenderness. Normal range of motion.  Skin:    General: Skin is warm and dry.  Neurological:     General: No focal deficit present.     Mental Status: She is alert.     GCS: GCS eye subscore is 4. GCS verbal subscore is 5. GCS motor subscore is 6.     Cranial Nerves: No cranial nerve deficit or facial asymmetry.     Motor: No weakness, tremor, seizure activity or pronator drift.     Coordination:  Finger-Nose-Finger Test normal.     Comments: CN II-XII intact; performed in supine position, +5 strength to bilateral upper extremities, +5 strength to dorsiflexion and plantarflexion, patient able to lift both legs against gravity and hold each there without difficulty, sensation to light touch grossly intact to bilateral upper and lower extremities  Psychiatric:        Behavior: Behavior is cooperative.    ED Results / Procedures / Treatments   Labs (all labs ordered are listed, but only abnormal results are displayed) Labs Reviewed  CBC WITH DIFFERENTIAL/PLATELET - Abnormal; Notable for the following components:      Result Value   WBC 2.5 (*)    RBC 3.66 (*)    Hemoglobin 7.3 (*)    HCT 23.1 (*)    MCV 63.1 (*)    MCH 19.9 (*)  RDW 16.0 (*)    Platelets 79 (*)    Neutro Abs 1.3 (*)    All other components within normal limits  COMPREHENSIVE METABOLIC PANEL - Abnormal; Notable for the following components:   Sodium 122 (*)    Chloride 91 (*)    Glucose, Bld 192 (*)    BUN 32 (*)    Creatinine, Ser 1.01 (*)    Calcium 8.6 (*)    Albumin 3.1 (*)    AST 62 (*)    ALT 71 (*)    GFR, Estimated 59 (*)    All other components within normal limits  CBG MONITORING, ED - Abnormal; Notable for the following components:   Glucose-Capillary 174 (*)    All other components within normal limits  RESP PANEL BY RT-PCR (FLU A&B, COVID) ARPGX2  LIPASE, BLOOD  URINALYSIS, ROUTINE W REFLEX MICROSCOPIC  TROPONIN I (HIGH SENSITIVITY)  TROPONIN I (HIGH SENSITIVITY)    EKG EKG Interpretation  Date/Time:  Wednesday May 28 2021 16:09:05 EST Ventricular Rate:  66 PR Interval:  154 QRS Duration: 74 QT Interval:  438 QTC Calculation: 459 R Axis:   34 Text Interpretation: Normal sinus rhythm Normal ECG When compared with ECG of 16-Mar-2019 10:31, PREVIOUS ECG IS PRESENT Confirmed by Thayer Jew [phone]) on 05/29/2021 3:03:25 AM  Radiology CT Head Wo Contrast  Result Date:  05/28/2021 CLINICAL DATA:  Headache.  Neuro deficit. EXAM: CT HEAD WITHOUT CONTRAST TECHNIQUE: Contiguous axial images were obtained from the base of the skull through the vertex without intravenous contrast. COMPARISON:  CT head 05/11/2016 FINDINGS: Brain: No evidence of acute infarction, hemorrhage, hydrocephalus, extra-axial collection or mass lesion/mass effect. Vascular: Negative for hyperdense vessel Skull: Negative Sinuses/Orbits: Moderate mucosal edema throughout the paranasal sinuses. Bilateral cataract extraction Other: None IMPRESSION: Negative CT head Moderate sinus mucosal edema. Electronically Signed   By: Franchot Gallo M.D.   On: 05/28/2021 17:04   CT ABDOMEN PELVIS W CONTRAST  Result Date: 05/29/2021 CLINICAL DATA:  Epigastric pain. EXAM: CT ABDOMEN AND PELVIS WITH CONTRAST TECHNIQUE: Multidetector CT imaging of the abdomen and pelvis was performed using the standard protocol following bolus administration of intravenous contrast. CONTRAST:  65mL OMNIPAQUE IOHEXOL 300 MG/ML  SOLN COMPARISON:  03/16/2019. FINDINGS: Lower chest: The heart is enlarged and there is a trace pericardial effusion. There is atelectasis at the left lung base. Patchy airspace disease with associated scarring noted at the right lung base. Hepatobiliary: A few subcentimeter hypodensities are present in the liver which are unchanged from the prior exam. No biliary ductal dilatation. The gallbladder is surgically absent. Pancreas: Unremarkable. No pancreatic ductal dilatation or surrounding inflammatory changes. Spleen: Normal in size without focal abnormality. Adrenals/Urinary Tract: Adrenal glands are unremarkable. Kidneys are normal, without renal calculi, focal lesion, or hydronephrosis. Bladder is unremarkable. Stomach/Bowel: There is a small hiatal hernia. No bowel obstruction, free air or pneumatosis. A normal appendix is seen in the right lower quadrant. No focal bowel wall thickening. Vascular/Lymphatic: Aortic  atherosclerosis. No enlarged abdominal or pelvic lymph nodes. Reproductive: The uterus is in-situ. A small cystic structure is seen in the right adnexa measuring 1.7 cm. There is a small cystic structure in the left adnexa measuring 1.5 cm. Other: No free fluid. Musculoskeletal: Degenerative changes are present in the thoracolumbar spine. There are compression deformities with vertebral plana at T12 and L1, unchanged from 2020. IMPRESSION: 1. No acute intra-abdominal process. 2. Small sliding hiatal hernia. 3. Mild consolidation adjacent to scarring in the right lower lobe,  possible atelectasis or infiltrate. 4. Cardiomegaly with a trace pericardial effusion. 5. Cystic structures in the adnexal regions bilaterally measuring up to 1.7 cm. Ultrasound is suggested for further characterization on follow-up. Electronically Signed   By: Brett Fairy M.D.   On: 05/29/2021 04:48    Procedures .Critical Care Performed by: Loni Beckwith, PA-C Authorized by: Loni Beckwith, PA-C   Critical care provider statement:    Critical care time (minutes):  30   Critical care was necessary to treat or prevent imminent or life-threatening deterioration of the following conditions:  Metabolic crisis   Critical care was time spent personally by me on the following activities:  Development of treatment plan with patient or surrogate, discussions with consultants, evaluation of patient's response to treatment, examination of patient, ordering and review of laboratory studies, ordering and review of radiographic studies, ordering and performing treatments and interventions, pulse oximetry, re-evaluation of patient's condition, review of old charts and obtaining history from patient or surrogate   Care discussed with: admitting provider      Medications Ordered in ED Medications  sodium chloride 0.9 % bolus 1,000 mL (1,000 mLs Intravenous New Bag/Given 05/29/21 0425)  morphine 2 MG/ML injection 2 mg (2 mg  Intravenous Given 05/29/21 0426)  iohexol (OMNIPAQUE) 300 MG/ML solution 75 mL (75 mLs Intravenous Contrast Given 05/29/21 0422)    ED Course/ Medical Decision Making/ A&P Clinical Course as of 05/29/21 0508  Thu May 29, 2021  0507 Spoke to hospitalist Dr.Gardner about hospitalist for patient's hyponatremia. [PB]    Clinical Course User Index [PB] Loni Beckwith, PA-C                           Medical Decision Making Amount and/or Complexity of Data Reviewed Labs: ordered. Radiology: ordered.  Risk Prescription drug management. Decision regarding hospitalization.   Alert 76 year old female in no acute distress, nontoxic-appearing.  Appears uncomfortable.  Presents to the emergency department with a chief complaint of headache and abdominal pain.Patient speaks montagnard rhade, patient was offered translator however request to have son at bedside act as Optometrist.  Complains of headache and abdominal pain over the last 2 to 3 days.  CBC, CMP, troponin, noncontrasted CT were ordered while patient was in triage.  Lab work and imaging was independently reviewed by myself.  CBC shows anemia with hemoglobin at 7.3 and hematocrit of 23.1.  Per chart review patient has chronic anemia Baseline appears 8-9.  Patient denies any melena, blood in stool or any other sources of bleeding.  Thrombocytopenia also noted, patient appears slightly below her baseline.  Leukopenia which has not been previously seen on patient's lab results.  CMP shows sodium of 122 2 days prior sodium was 126.  Previously patient sodium within normal limits.  Patient will liter fluid bolus and plan to admit for hyponatremia. CMP also shows slightly elevated AST and ALT.  This is new.  Dr. With patient's abdominal pain will order CT abdomen pelvis to evaluate for possible hepatobiliary disease.  CT abdomen pelvis shows no acute intra-abdominal process.  Cystic structures noted to adnexal regions bilaterally, ultrasound  will be required for further characterization.  Mild consolidation adjacent to scarring in right lower lobe, possible atelectasis or infiltrate.  Patient denies any cough or shortness of breath.  Lungs clear to auscultation on exam.  We will hold any antibiotics for pneumonia at this time.  Spoke to hospitalist Dr. Alcario Drought about admission.  Patient was  discussed with and evaluated by Dr. Dina Rich.         Final Clinical Impression(s) / ED Diagnoses Final diagnoses:  Hyponatremia    Rx / DC Orders ED Discharge Orders     None         Loni Beckwith, PA-C 05/29/21 0509    Loni Beckwith, PA-C 06/17/21 1557    Dina Rich Barbette Hair, MD 06/19/21 [phone]

## 2021-05-29 NOTE — Assessment & Plan Note (Signed)
-  Stage 3a CKD appears to be stable -Continue Cozaar

## 2021-05-29 NOTE — ED Notes (Signed)
Patient ambulated to BR and back to bed with son at side without difficulty.

## 2021-05-29 NOTE — Assessment & Plan Note (Signed)
-  Last A1c was >15, indicating very poor control -Continue Lantus -Cover with sensitive-scale SSI

## 2021-05-29 NOTE — Assessment & Plan Note (Addendum)
-  Appears to be stable -Followed by heme/onc, saw Dr. [name] on 1/3 but office note is not complete -No intervention for now -Will add Dr. [name] to the treatment team for now

## 2021-05-29 NOTE — Assessment & Plan Note (Signed)
-  Stable -Recheck in AM

## 2021-05-29 NOTE — ED Notes (Signed)
Pt ambulated to bathroom using personal cane and family assisting pt.

## 2021-05-29 NOTE — Assessment & Plan Note (Signed)
-  Patient with presenting with headache, abdominal pain, and n/v -She does not have an O2 requirement  -COVID POSITIVE -The patient has comorbidities which may increase the risk for ARDS/MODS including: age, HTN, DM, CKD -Pertinent labs concerning for COVID include leukopenia; increased BUN/Creatinine; increased LFTs.  Remaining COVID labs are pending. -CXR pending  -Will not treat with broad-spectrum antibiotics if procalcitonin <0.5 -Will admit for further evaluation, close monitoring, and treatment -Monitor on telemetry x at least 24 hours -At this time, will attempt to avoid use of aerosolized medications and use HFAs instead -Will check daily labs including BMP with Mag, Phos; LFTs; CBC with differential; CRP; ferritin; fibrinogen; D-dimer -Will order steroids and Paxlovid -If the patient shows clinical deterioration, consider transfer to ICU with PCCM consultation -PT/OT consults -Encourage mobilization/ambulation as much as possible -Patient was seen wearing full PPE including: gown, gloves, N95, and face shield; donning and doffing was in compliance with current standards.

## 2021-05-29 NOTE — H&P (Signed)
History and Physical    Patient: Olivia Williamson Bevis ZOX:[id number] DOB: 1946-03-26 DOA: 05/28/2021 DOS: the patient was seen and examined on 05/29/2021 PCP: Marcine MatarJohnson, Deborah B, MD  Patient coming from: Home - lives with daughter and son-in-law; NOK: Jobe GibbonSon, Bunik Ayun, [phone]   Chief Complaint: headache  HPI: Olivia Williamson Corey is a 76 y.o. female with medical history significant of DM; HTN; and tuberculosis presenting with headache and abdominal pain.  The patient's son reports that she has periods of "sickness" that have happened for the last 4-5 years.  She goes to bed for several days at a time.  Over the last 1-2 days she has complained of headache, n/v and chills without fever.   He thought it was related to her usual sickness.  She came to the ER yesterday for abdominal pain, headache, and nausea; she developed n/v and cough with post-tussive emesis while in the ER overnight.  Her abdominal pain has resolved.    ER Course:  Carryover, per Dr. Julian ReilGardner:  76 yo F with hyponatremia.  Sodium is 122.  Was 126 x2 days ago.  WNL over past few months.  Not clear cause.  Not much vomiting nor diarrhea according to EDP.    Review of Systems: Unable to review all systems due to lack of cooperation from patient. Past Medical History:  Diagnosis Date   Arthritis    Diabetes mellitus without complication (HCC)    stopped DM meds 2004   GERD (gastroesophageal reflux disease)    Hypertension    meds stopped 1 week ago , need to get renewed   Tuberculosis    ACTIVE   Past Surgical History:  Procedure Laterality Date   ABDOMINAL HYSTERECTOMY     CHOLECYSTECTOMY N/A 05/02/2014   Procedure: LAPAROSCOPIC CHOLECYSTECTOMY ;  Surgeon: Abigail Miyamotoouglas Blackman, MD;  Location: [facility] OR;  Service: General;  Laterality: N/A;  laparoscopic cholecystectomy   COLONOSCOPY     VIDEO BRONCHOSCOPY Bilateral 01/10/2016   Procedure: VIDEO BRONCHOSCOPY WITHOUT FLUORO;  Surgeon: Leslye Peerobert S Byrum, MD;  [facility];  Service:  Cardiopulmonary;  Laterality: Bilateral;   Social History:  reports that she has never smoked. She has never used smokeless tobacco. She reports that she does not drink alcohol and does not use drugs.  Allergies  Allergen Reactions   Shrimp [Shellfish Allergy]     Sneezing and SOB   Beef-Derived Products Other (See Comments)    Generalized burning sensation   Fruit & Vegetable Daily [Nutritional Supplements] Other (See Comments)    Orange causes lower extremity burning   Metformin And Related Diarrhea   Norvasc [Amlodipine]     Le EDEMA    Family History  Problem Relation Age of Onset   Heart disease Maternal Uncle [id number]    Prior to Admission medications   Medication Sig Start Date End Date Taking? Authorizing Provider  B-D UF III MINI PEN NEEDLES 31G X 5 MM MISC USE TO INJECT LANTUS ONCE A DAY AND NOVOLOG TWICE DAILY. (TOTAL OF 3 INJECTIONS DAILY) 03/08/21  Yes Marcine MatarJohnson, Deborah B, MD  Cholecalciferol (VITAMIN D3) 10 MCG (400 UNIT) tablet TAKE 2 TABLETS BY MOUTH EVERY DAY Patient taking differently: Take 800 Units by mouth daily. 04/16/21  Yes Marcine MatarJohnson, Deborah B, MD  [employer] CALCIUM 600 & VITAMIN D3 600-20 MG-MCG TABS TAKE 1 TABLET (600 MG TOTAL) BY MOUTH 2 (TWO) TIMES DAILY WITH A MEAL. Patient taking differently: Take 1 tablet by mouth in the morning and at bedtime. 05/26/21  Yes Jonah BlueJohnson, Deborah  B, MD  hydrochlorothiazide (HYDRODIURIL) 12.5 MG tablet Take 1 tablet (12.5 mg total) by mouth daily. Stop Amlodipine 03/13/21  Yes [name], MD  insulin aspart (NOVOLOG FLEXPEN) 100 UNIT/ML FlexPen 8 units with breakfast and dinner Patient taking differently: 8 Units 2 (two) times daily with a meal. 03/14/20  Yes [name], MD  insulin glargine (LANTUS SOLOSTAR) 100 UNIT/ML Solostar Pen Inject 34 Units into the skin at bedtime. 04/14/21  Yes [name], MD  losartan (COZAAR) 25 MG tablet Take 1 tablet (25 mg total) by mouth daily. 04/14/21  Yes [name], MD   fluticasone [facility]) 50 MCG/ACT nasal spray SPRAY 2 SPRAYS INTO EACH NOSTRIL EVERY DAY Patient not taking: Reported on 05/29/2021 04/20/21   [name], MD  gabapentin (NEURONTIN) 300 MG capsule TAKE 2 CAPSULES (600 MG TOTAL) BY MOUTH AT BEDTIME. Patient not taking: Reported on 05/29/2021 09/06/19   [name], MD  silver sulfADIAZINE (SILVADENE) 1 % cream Apply 1 application topically daily. Patient not taking: Reported on 05/29/2021 06/27/20   Trula Slade, [place]    Physical Exam: Vitals:   05/29/21 0816 05/29/21 1029 05/29/21 1302 05/29/21 1526  BP: (!) 164/70 (!) 154/72 (!) 144/70 135/68  Pulse: 79 81 80 78  Resp: (!) 22 18 18 18   Temp: 98.6 F (37 C) 98.6 F (37 C) 98.5 F (36.9 C)   TempSrc: Oral Oral Oral   SpO2: 100% 98% 98% 97%  Weight:      Height:       General:  Appears calm and comfortable and is in NAD, on RA Eyes:   EOMI, normal lids, iris ENT:  grossly normal hearing, lips & tongue, mmm Neck:  no LAD, masses or thyromegaly Cardiovascular:  RRR, no m/r/g. No LE edema.  Respiratory:   CTA bilaterally with no wheezes/rales/rhonchi.  Normal to mildly increased respiratory effort.  Periodic coarse cough. Abdomen:  soft, NT, ND Skin:  no rash or induration seen on limited exam Musculoskeletal:  grossly normal tone BUE/BLE, good ROM, no bony abnormality Psychiatric:  grossly normal mood and affect, speech fluent but not in [place] Neurologic:  CN 2-12 grossly intact, moves all extremities in coordinated fashion   Radiological Exams on Admission: Independently reviewed - see discussion in A/P where applicable  DG Chest 2 View  Result Date: 05/29/2021 CLINICAL DATA:  COVID. Hx of DM, GERD, HTN, TB. Pt is a nonsmoker. EXAM: CHEST - 2 VIEW COMPARISON:  Chest x-ray 03/16/2019, CT chest 03/16/2019 FINDINGS: The heart and mediastinal contours are unchanged. Aortic calcification. Low lung volumes. Hazy airspace opacity within the left lower lung zone likely due  to artifact from overlying soft tissues. No pulmonary edema. No pleural effusion. No pneumothorax. No acute osseous abnormality. IMPRESSION: Low lung volumes.  No active cardiopulmonary disease. Electronically Signed   By: Iven Finn M.D.   On: 05/29/2021 15:43   CT Head Wo Contrast  Result Date: 05/28/2021 CLINICAL DATA:  Headache.  Neuro deficit. EXAM: CT HEAD WITHOUT CONTRAST TECHNIQUE: Contiguous axial images were obtained from the base of the skull through the vertex without intravenous contrast. COMPARISON:  CT head 05/11/2016 FINDINGS: Brain: No evidence of acute infarction, hemorrhage, hydrocephalus, extra-axial collection or mass lesion/mass effect. Vascular: Negative for hyperdense vessel Skull: Negative Sinuses/Orbits: Moderate mucosal edema throughout the paranasal sinuses. Bilateral cataract extraction Other: None IMPRESSION: Negative CT head Moderate sinus mucosal edema. Electronically Signed   By: Franchot Gallo M.D.   On: 05/28/2021 17:04  CT ABDOMEN PELVIS W CONTRAST  Result Date: 05/29/2021 CLINICAL DATA:  Epigastric pain. EXAM: CT ABDOMEN AND PELVIS WITH CONTRAST TECHNIQUE: Multidetector CT imaging of the abdomen and pelvis was performed using the standard protocol following bolus administration of intravenous contrast. CONTRAST:  55mL OMNIPAQUE IOHEXOL 300 MG/ML  SOLN COMPARISON:  03/16/2019. FINDINGS: Lower chest: The heart is enlarged and there is a trace pericardial effusion. There is atelectasis at the left lung base. Patchy airspace disease with associated scarring noted at the right lung base. Hepatobiliary: A few subcentimeter hypodensities are present in the liver which are unchanged from the prior exam. No biliary ductal dilatation. The gallbladder is surgically absent. Pancreas: Unremarkable. No pancreatic ductal dilatation or surrounding inflammatory changes. Spleen: Normal in size without focal abnormality. Adrenals/Urinary Tract: Adrenal glands are unremarkable. Kidneys  are normal, without renal calculi, focal lesion, or hydronephrosis. Bladder is unremarkable. Stomach/Bowel: There is a small hiatal hernia. No bowel obstruction, free air or pneumatosis. A normal appendix is seen in the right lower quadrant. No focal bowel wall thickening. Vascular/Lymphatic: Aortic atherosclerosis. No enlarged abdominal or pelvic lymph nodes. Reproductive: The uterus is in-situ. A small cystic structure is seen in the right adnexa measuring 1.7 cm. There is a small cystic structure in the left adnexa measuring 1.5 cm. Other: No free fluid. Musculoskeletal: Degenerative changes are present in the thoracolumbar spine. There are compression deformities with vertebral plana at T12 and L1, unchanged from 2020. IMPRESSION: 1. No acute intra-abdominal process. 2. Small sliding hiatal hernia. 3. Mild consolidation adjacent to scarring in the right lower lobe, possible atelectasis or infiltrate. 4. Cardiomegaly with a trace pericardial effusion. 5. Cystic structures in the adnexal regions bilaterally measuring up to 1.7 cm. Ultrasound is suggested for further characterization on follow-up. Electronically Signed   By: Brett Fairy M.D.   On: 05/29/2021 04:48    EKG: Independently reviewed.  NSR with rate 66; no evidence of acute ischemia   Labs on Admission: I have personally reviewed the available labs and imaging studies at the time of the admission.  Pertinent labs:    Na++ 122; 126 on 1/3, 134 on 11/21 Glucose 192 BUN 32/Creatinine 1.01/GFR 59 - stable Albumin 3.1 AST 62/ALT 71 HS troponin 4 WBC 2.5 Hgb 7.3 - stable Platelets 79 - stable UA unremarkable  COVID POSITIVE   Assessment/Plan * COVID-19- (present on admission) -Patient with presenting with headache, abdominal pain, and n/v -She does not have an O2 requirement  -COVID POSITIVE -The patient has comorbidities which may increase the risk for ARDS/MODS including: age, HTN, DM, CKD -Pertinent labs concerning for COVID  include leukopenia; increased BUN/Creatinine; increased LFTs.  Remaining COVID labs are pending. -CXR pending  -Will not treat with broad-spectrum antibiotics if procalcitonin <0.5 -Will admit for further evaluation, close monitoring, and treatment -Monitor on telemetry x at least 24 hours -At this time, will attempt to avoid use of aerosolized medications and use HFAs instead -Will check daily labs including BMP with Mag, Phos; LFTs; CBC with differential; CRP; ferritin; fibrinogen; D-dimer -Will order steroids and Paxlovid -If the patient shows clinical deterioration, consider transfer to ICU with PCCM consultation -PT/OT consults -Encourage mobilization/ambulation as much as possible -Patient was seen wearing full PPE including: gown, gloves, N95, and face shield; donning and doffing was in compliance with current standards.  Stage 3a chronic kidney disease (CKD) ([place])- (present on admission) -Stable -Recheck in AM  Hyponatremia- (present on admission) -Etiology appears to be hypovolemic hyponatremia; patient with COVID plus n/v and  decreased PO intake. -She appears to be stable at this time -Will gently rehydrate and recheck BMP in AM since Na++ is >120  Pancytopenia ([place])- (present on admission) -Appears to be stable -Followed by heme/onc, saw Dr. [name] on 1/3 but office note is not complete -No intervention for now -Will add Dr. 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 Inpatient status is judged to be reasonable and necessary in order to provide the required intensity of service to ensure the patient's safety. The patient's presenting symptoms, physical exam findings, and initial radiographic and laboratory data in the context of their chronic comorbidities is felt to place them at high risk for further clinical deterioration. Furthermore, it is not anticipated that the patient will be medically stable for discharge from the hospital within 2 midnights of admission.   * I certify that at the point of admission it is my clinical judgment that the patient will require inpatient hospital care spanning beyond 2 midnights from the point of admission due to high intensity of service, high risk for further deterioration and high frequency of surveillance required.*  Author: Karmen Bongo, MD 05/29/2021 4:44 PM  For on call review [email or web address].

## 2021-05-30 ENCOUNTER — Telehealth: Payer: Self-pay | Admitting: Hematology

## 2021-05-30 DIAGNOSIS — U071 COVID-19: Secondary | ICD-10-CM | POA: Diagnosis not present

## 2021-05-30 DIAGNOSIS — E860 Dehydration: Secondary | ICD-10-CM | POA: Diagnosis not present

## 2021-05-30 DIAGNOSIS — E871 Hypo-osmolality and hyponatremia: Secondary | ICD-10-CM | POA: Diagnosis not present

## 2021-05-30 LAB — TSH: TSH: 0.46 u[IU]/mL (ref 0.350–4.500)

## 2021-05-30 LAB — GLUCOSE, RANDOM: Glucose, Bld: 437 mg/dL — ABNORMAL HIGH (ref 70–99)

## 2021-05-30 LAB — COMPREHENSIVE METABOLIC PANEL
ALT: 65 U/L — ABNORMAL HIGH (ref 0–44)
AST: 44 U/L — ABNORMAL HIGH (ref 15–41)
Albumin: 3.2 g/dL — ABNORMAL LOW (ref 3.5–5.0)
Alkaline Phosphatase: 68 U/L (ref 38–126)
Anion gap: 8 (ref 5–15)
BUN: 31 mg/dL — ABNORMAL HIGH (ref 8–23)
CO2: 25 mmol/L (ref 22–32)
Calcium: 9 mg/dL (ref 8.9–10.3)
Chloride: 100 mmol/L (ref 98–111)
Creatinine, Ser: 1.19 mg/dL — ABNORMAL HIGH (ref 0.44–1.00)
GFR, Estimated: 48 mL/min — ABNORMAL LOW (ref >60)
Glucose, Bld: 213 mg/dL — ABNORMAL HIGH (ref 70–99)
Potassium: 4.5 mmol/L (ref 3.5–5.1)
Sodium: 133 mmol/L — ABNORMAL LOW (ref 135–145)
Total Bilirubin: 0.6 mg/dL (ref 0.3–1.2)
Total Protein: 7.1 g/dL (ref 6.5–8.1)

## 2021-05-30 LAB — IRON AND TIBC
Iron: 74 ug/dL (ref 28–170)
Saturation Ratios: 25 % (ref 10.4–31.8)
TIBC: 298 ug/dL (ref 250–450)
UIBC: 224 ug/dL

## 2021-05-30 LAB — RETICULOCYTES
Immature Retic Fract: 19.6 % — ABNORMAL HIGH (ref 2.3–15.9)
RBC.: 3.69 MIL/uL — ABNORMAL LOW (ref 3.87–5.11)
Retic Count, Absolute: 35.1 10*3/uL (ref 19.0–186.0)
Retic Ct Pct: 1 % (ref 0.4–3.1)

## 2021-05-30 LAB — URIC ACID: Uric Acid, Serum: 3.4 mg/dL (ref 2.5–7.1)

## 2021-05-30 LAB — CBC WITH DIFFERENTIAL/PLATELET
Abs Immature Granulocytes: 0.04 10*3/uL (ref 0.00–0.07)
Basophils Absolute: 0 10*3/uL (ref 0.0–0.1)
Basophils Relative: 0 %
Eosinophils Absolute: 0 10*3/uL (ref 0.0–0.5)
Eosinophils Relative: 0 %
HCT: 22.4 % — ABNORMAL LOW (ref 36.0–46.0)
Hemoglobin: 7.4 g/dL — ABNORMAL LOW (ref 12.0–15.0)
Immature Granulocytes: 2 %
Lymphocytes Relative: 13 %
Lymphs Abs: 0.3 10*3/uL — ABNORMAL LOW (ref 0.7–4.0)
MCH: 20.7 pg — ABNORMAL LOW (ref 26.0–34.0)
MCHC: 33 g/dL (ref 30.0–36.0)
MCV: 62.7 fL — ABNORMAL LOW (ref 80.0–100.0)
Monocytes Absolute: 0 10*3/uL — ABNORMAL LOW (ref 0.1–1.0)
Monocytes Relative: 1 %
Neutro Abs: 2 10*3/uL (ref 1.7–7.7)
Neutrophils Relative %: 84 %
Platelets: UNDETERMINED 10*3/uL (ref 150–400)
RBC: 3.57 MIL/uL — ABNORMAL LOW (ref 3.87–5.11)
RDW: 15.8 % — ABNORMAL HIGH (ref 11.5–15.5)
WBC: 2.4 10*3/uL — ABNORMAL LOW (ref 4.0–10.5)
nRBC: 0 % (ref 0.0–0.2)

## 2021-05-30 LAB — D-DIMER, QUANTITATIVE: D-Dimer, Quant: 0.5 ug/mL-FEU (ref 0.00–0.50)

## 2021-05-30 LAB — GLUCOSE, CAPILLARY
Glucose-Capillary: 432 mg/dL — ABNORMAL HIGH (ref 70–99)
Glucose-Capillary: 404 mg/dL — ABNORMAL HIGH (ref 70–99)

## 2021-05-30 LAB — FOLATE: Folate: 20.1 ng/mL (ref >5.9)

## 2021-05-30 LAB — OSMOLALITY, URINE: Osmolality, Ur: 429 mOsm/kg (ref 300–900)

## 2021-05-30 LAB — FERRITIN: Ferritin: 375 ng/mL — ABNORMAL HIGH (ref 11–307)

## 2021-05-30 LAB — PHOSPHORUS: Phosphorus: 2.8 mg/dL (ref 2.5–4.6)

## 2021-05-30 LAB — CBG MONITORING, ED: Glucose-Capillary: 328 mg/dL — ABNORMAL HIGH (ref 70–99)

## 2021-05-30 LAB — C-REACTIVE PROTEIN: CRP: 0.6 mg/dL (ref <1.0)

## 2021-05-30 LAB — TECHNOLOGIST SMEAR REVIEW: Plt Morphology: DECREASED

## 2021-05-30 LAB — VITAMIN B12: Vitamin B-12: 1216 pg/mL — ABNORMAL HIGH (ref 180–914)

## 2021-05-30 LAB — OSMOLALITY: Osmolality: 298 mOsm/kg — ABNORMAL HIGH (ref 275–295)

## 2021-05-30 LAB — MAGNESIUM: Magnesium: 1.9 mg/dL (ref 1.7–2.4)

## 2021-05-30 LAB — SODIUM, URINE, RANDOM: Sodium, Ur: 92 mmol/L

## 2021-05-30 LAB — CREATININE, URINE, RANDOM: Creatinine, Urine: 37.71 mg/dL

## 2021-05-30 MED ORDER — INSULIN ASPART 100 UNIT/ML IJ SOLN: 12.0000 [IU] | Freq: Once | INTRAMUSCULAR | Status: DC

## 2021-05-30 MED ORDER — FERROUS SULFATE 325 (65 FE) MG PO TABS: 325.0000 mg | ORAL_TABLET | Freq: Two times a day (BID) | ORAL | Status: DC

## 2021-05-30 MED ORDER — SODIUM CHLORIDE 0.9 % IV SOLN
250.0000 mg | Freq: Once | INTRAVENOUS | Status: DC
  Filled 2021-05-30: qty 20

## 2021-05-30 MED ORDER — FERROUS SULFATE 325 (65 FE) MG PO TABS
325.0000 mg | ORAL_TABLET | Freq: Two times a day (BID) | ORAL | Status: DC
  Administered 2021-05-30 – 2021-06-02 (×7): 325 mg via ORAL
  Filled 2021-05-30 (×7): qty 1

## 2021-05-30 MED ORDER — CARVEDILOL 3.125 MG PO TABS
3.1250 mg | ORAL_TABLET | Freq: Two times a day (BID) | ORAL | Status: DC
  Administered 2021-05-30 – 2021-05-31 (×3): 3.125 mg via ORAL
  Filled 2021-05-30 (×3): qty 1

## 2021-05-30 MED ORDER — FOLIC ACID 1 MG PO TABS
1.0000 mg | ORAL_TABLET | Freq: Every day | ORAL | Status: DC
  Administered 2021-05-30 – 2021-06-02 (×4): 1 mg via ORAL
  Filled 2021-05-30 (×4): qty 1

## 2021-05-30 MED ORDER — ENOXAPARIN SODIUM 30 MG/0.3ML IJ SOSY
30.0000 mg | PREFILLED_SYRINGE | INTRAMUSCULAR | Status: DC
  Administered 2021-05-30 – 2021-06-02 (×4): 30 mg via SUBCUTANEOUS
  Filled 2021-05-30 (×4): qty 0.3

## 2021-05-30 MED ORDER — INSULIN ASPART 100 UNIT/ML IJ SOLN
15.0000 [IU] | Freq: Once | INTRAMUSCULAR | Status: AC
  Administered 2021-05-30: 15 [IU] via SUBCUTANEOUS

## 2021-05-30 MED ORDER — CARVEDILOL 3.125 MG PO TABS: 3.1250 mg | ORAL_TABLET | Freq: Two times a day (BID) | ORAL | Status: DC

## 2021-05-30 MED ORDER — LABETALOL HCL 5 MG/ML IV SOLN
10.0000 mg | Freq: Once | INTRAVENOUS | Status: AC
  Administered 2021-05-30: 10 mg via INTRAVENOUS
  Filled 2021-05-30: qty 4

## 2021-05-30 NOTE — ED Notes (Signed)
Resting with eyes closed. No signs of distress. Son at bedside.

## 2021-05-30 NOTE — Progress Notes (Signed)
PROGRESS NOTE                                                                                                                                                                                                             Patient Demographics:    Olivia Williamson, is a 76 y.o. female, DOB - [birth year], RI:[id number]  Outpatient Primary MD for the patient is [name], MD    LOS - 1  Admit date - 05/28/2021    Chief Complaint  Patient presents with   Altered Mental Status       Brief Narrative (HPI from H&P)  : Olivia Williamson is a 76 y.o. female with medical history significant of DM; HTN; and tuberculosis presenting with headache and abdominal pain.  The patient's son reports that she has periods of "sickness" that have happened for the last 4-5 years.  She goes to bed for several days at a time.  Over the last 1-2 days she has complained of headache, n/v and chills without fever.   He thought it was related to her usual sickness.  She came to the ER yesterday for abdominal pain, headache, and nausea; she developed n/v and cough with post-tussive emesis while in the ER, in the ER her work-up was suggestive of COVID-19 infection likely incidental, abdominal pain with nausea vomiting, severe hyponatremia due to dehydration and she was admitted.   Subjective:    Olivia Williamson today has, No headache, No chest pain, No abdominal pain - No Nausea, No new weakness tingling or numbness, no Cough - SOB.     Assessment  & Plan :     Dehydration with hyponatremia-resolved after IV fluids in the ER.  Stable now.  2.  Abdominal pain with nausea vomiting transient in the ER.  CT scan abdomen pelvis and abdominal exam are unremarkable, she is now symptom-free continue to monitor with supportive care and advance diet.  3.  Chronic microcytic anemia.  Check anemia panel, placed on oral iron and folic acid, IV iron infusion if she qualifies, outpatient  age-appropriate work-up per PCP.  4.  Pancytopenia.  Will get peripheral smear, anemia panel as above, could be accounting for her chronic fatigue, outpatient follow-up with hematology postdischarge.  Note this is somewhat chronic going back in her labs several mths ago.  6.  Incidental COVID-19  infection.  Supportive care.  She is already been started on Paxlovid.  7.  DM type II.  Home regimen and monitor.  Lab Results  Component Value Date   HGBA1C >15.0 02/11/2021    CBG (last 3)  Recent Labs    05/29/21 1321 05/29/21 1652 05/29/21 2130  GLUCAP 197* 293* 123*            Condition - Extremely Guarded  Family Communication  : Son Bedside 2023  Code Status :  Full  Consults  :  None  PUD Prophylaxis :    Procedures  :     CT Head  - Non acute  CT -  1. No acute intra-abdominal process. 2. Small sliding hiatal hernia. 3. Mild consolidation adjacent to scarring in the right lower lobe, possible atelectasis or infiltrate. 4. Cardiomegaly with a trace pericardial effusion. 5. Cystic structures in the adnexal regions bilaterally measuring up to 1.7 cm. Ultrasound is suggested for further characterization on follow-up.      Disposition Plan  :    Status is: Inpatient  Remains inpatient appropriate because: Hyponatremia   DVT Prophylaxis  :    enoxaparin (LOVENOX) injection 40 mg Start: 05/29/21 1000  Lab Results  Component Value Date   PLT PLATELET CLUMPS NOTED ON SMEAR, UNABLE TO ESTIMATE 05/30/2021    Diet :  Diet Order             Diet Carb Modified Fluid consistency: Thin; Room service appropriate? Yes  Diet effective now                    Inpatient Medications  Scheduled Meds:  vitamin C  500 mg Oral Daily   carvedilol  3.125 mg Oral BID WC   enoxaparin (LOVENOX) injection  40 mg Subcutaneous Q24H   ferrous sulfate  325 mg Oral BID WC   folic acid  1 mg Oral Daily   insulin aspart  0-5 Units Subcutaneous QHS   insulin aspart  0-9  Units Subcutaneous TID WC   insulin glargine-yfgn  34 Units Subcutaneous QHS   losartan  25 mg Oral Daily   nirmatrelvir/ritonavir EUA (renal dosing)  2 tablet Oral BID   zinc sulfate  220 mg Oral Daily   Continuous Infusions:  lactated ringers 75 mL/hr at 05/30/21 0636   PRN Meds:.acetaminophen, albuterol, bisacodyl, chlorpheniramine-HYDROcodone, guaiFENesin-dextromethorphan, [DISCONTINUED] ondansetron **OR** ondansetron (ZOFRAN) IV, polyethylene glycol, sodium phosphate  Antibiotics  :    Anti-infectives (From admission, onward)    Start     Dose/Rate Route Frequency Ordered Stop   05/29/21 1300  nirmatrelvir/ritonavir EUA (renal dosing) (PAXLOVID) 2 tablet        2 tablet Oral 2 times daily 05/29/21 1249 06/03/21 0959   05/29/21 1000  nirmatrelvir/ritonavir EUA (PAXLOVID) 3 tablet  Status:  Discontinued        3 tablet Oral 2 times daily 05/29/21 0949 05/29/21 1248        Time Spent in minutes  [place] M.D on 05/30/2021 at 9:30 AM  To page go to [email or web address]   [facility] -  Office  [phone]  See all Orders from today for further details    Objective:   Vitals:   05/29/21 1930 05/29/21 2317 05/30/21 0314 05/30/21 0550  BP: (!) 126/59 (!) 169/86 (!) 167/79 (!) 176/72  Pulse: 88 82 84 86  Resp: 18 18 16 18   Temp: 98.5 F (36.9 C)   98.3 F (36.8 C)  TempSrc: Oral   Oral  SpO2: 99% 99% 97% 96%  Weight:      Height:        Wt Readings from Last 3 Encounters:  05/28/21 46.3 kg  05/27/21 46.6 kg  04/14/21 46.7 kg     Intake/Output Summary (Last 24 hours) at 05/30/2021 0930 Last data filed at 05/30/2021 [id number] Gross per 24 hour  Intake 1516.39 ml  Output --  Net 1516.39 ml     Physical Exam  Awake Alert, No new F.N deficits, Normal affect [place].AT,PERRAL Supple Neck, No JVD,   Symmetrical Chest wall movement, Good air movement bilaterally, CTAB RRR,No Gallops,Rubs or new Murmurs,  +ve B.Sounds, Abd Soft, No tenderness,   No  Cyanosis, Clubbing or edema       Data Review:    CBC Recent Labs  Lab 05/27/21 1200 05/27/21 1202 05/28/21 1611 05/30/21 0618  WBC 2.1*  --  2.5* 2.4*  HGB 7.3*  --  7.3* 7.4*  HCT 22.7* 23.4* 23.1* 22.4*  PLT 82*  --  79* PLATELET CLUMPS NOTED ON SMEAR, UNABLE TO ESTIMATE  MCV 63.1*  --  63.1* 62.7*  MCH 20.3*  --  19.9* 20.7*  MCHC 32.2  --  31.6 33.0  RDW 16.0*  --  16.0* 15.8*  LYMPHSABS 0.7  --  1.0 0.3*  MONOABS 0.3  --  0.3 0.0*  EOSABS 0.0  --  0.0 0.0  BASOSABS 0.0  --  0.0 0.0    Electrolytes Recent Labs  Lab 05/27/21 1200 05/28/21 1611 05/29/21 1345 05/30/21 0315  NA 126* 122*  --  133*  K 4.0 4.2  --  4.5  CL 94* 91*  --  100  CO2 24 23  --  25  GLUCOSE 140* 192*  --  213*  BUN 46* 32*  --  31*  CREATININE 1.21* 1.01*  --  1.19*  CALCIUM 8.3* 8.6*  --  9.0  AST 56* 62*  --  44*  ALT 67* 71*  --  65*  ALKPHOS 67 63  --  68  BILITOT 0.5 0.8  --  0.6  ALBUMIN 3.6 3.1*  --  3.2*  MG  --   --   --  1.9  CRP  --   --  0.5 0.6  DDIMER  --   --  0.53* 0.50  PROCALCITON  --   --  <0.10  --     ------------------------------------------------------------------------------------------------------------------ No results for input(s): CHOL, HDL, LDLCALC, TRIG, CHOLHDL, LDLDIRECT in the last 72 hours.  Lab Results  Component Value Date   HGBA1C >15.0 02/11/2021    No results for input(s): TSH, T4TOTAL, T3FREE, THYROIDAB in the last 72 hours.  Invalid input(s): FREET3 ------------------------------------------------------------------------------------------------------------------ ID Labs Recent Labs  Lab 05/27/21 1200 05/28/21 1611 05/29/21 1345 05/30/21 0315 05/30/21 0618  WBC 2.1* 2.5*  --   --  2.4*  PLT 82* 79*  --   --  PLATELET CLUMPS NOTED ON SMEAR, UNABLE TO ESTIMATE  CRP  --   --  0.5 0.6  --   DDIMER  --   --  0.53* 0.50  --   PROCALCITON  --   --  <0.10  --   --   CREATININE 1.21* 1.01*  --  1.19*  --    Cardiac Enzymes No  results for input(s): CKMB, TROPONINI, MYOGLOBIN in the last 168 hours.  Invalid input(s): CK   Radiology Reports DG Chest 2 View  Result Date: 05/29/2021 CLINICAL  DATA:  COVID. Hx of DM, GERD, HTN, TB. Pt is a nonsmoker. EXAM: CHEST - 2 VIEW COMPARISON:  Chest x-ray 03/16/2019, CT chest 03/16/2019 FINDINGS: The heart and mediastinal contours are unchanged. Aortic calcification. Low lung volumes. Hazy airspace opacity within the left lower lung zone likely due to artifact from overlying soft tissues. No pulmonary edema. No pleural effusion. No pneumothorax. No acute osseous abnormality. IMPRESSION: Low lung volumes.  No active cardiopulmonary disease. Electronically Signed   By: Iven Finn M.D.   On: 05/29/2021 15:43   CT Head Wo Contrast  Result Date: 05/28/2021 CLINICAL DATA:  Headache.  Neuro deficit. EXAM: CT HEAD WITHOUT CONTRAST TECHNIQUE: Contiguous axial images were obtained from the base of the skull through the vertex without intravenous contrast. COMPARISON:  CT head 05/11/2016 FINDINGS: Brain: No evidence of acute infarction, hemorrhage, hydrocephalus, extra-axial collection or mass lesion/mass effect. Vascular: Negative for hyperdense vessel Skull: Negative Sinuses/Orbits: Moderate mucosal edema throughout the paranasal sinuses. Bilateral cataract extraction Other: None IMPRESSION: Negative CT head Moderate sinus mucosal edema. Electronically Signed   By: Franchot Gallo M.D.   On: 05/28/2021 17:04   CT ABDOMEN PELVIS W CONTRAST  Result Date: 05/29/2021 CLINICAL DATA:  Epigastric pain. EXAM: CT ABDOMEN AND PELVIS WITH CONTRAST TECHNIQUE: Multidetector CT imaging of the abdomen and pelvis was performed using the standard protocol following bolus administration of intravenous contrast. CONTRAST:  43mL OMNIPAQUE IOHEXOL 300 MG/ML  SOLN COMPARISON:  03/16/2019. FINDINGS: Lower chest: The heart is enlarged and there is a trace pericardial effusion. There is atelectasis at the left lung base.  Patchy airspace disease with associated scarring noted at the right lung base. Hepatobiliary: A few subcentimeter hypodensities are present in the liver which are unchanged from the prior exam. No biliary ductal dilatation. The gallbladder is surgically absent. Pancreas: Unremarkable. No pancreatic ductal dilatation or surrounding inflammatory changes. Spleen: Normal in size without focal abnormality. Adrenals/Urinary Tract: Adrenal glands are unremarkable. Kidneys are normal, without renal calculi, focal lesion, or hydronephrosis. Bladder is unremarkable. Stomach/Bowel: There is a small hiatal hernia. No bowel obstruction, free air or pneumatosis. A normal appendix is seen in the right lower quadrant. No focal bowel wall thickening. Vascular/Lymphatic: Aortic atherosclerosis. No enlarged abdominal or pelvic lymph nodes. Reproductive: The uterus is in-situ. A small cystic structure is seen in the right adnexa measuring 1.7 cm. There is a small cystic structure in the left adnexa measuring 1.5 cm. Other: No free fluid. Musculoskeletal: Degenerative changes are present in the thoracolumbar spine. There are compression deformities with vertebral plana at T12 and L1, unchanged from 2020. IMPRESSION: 1. No acute intra-abdominal process. 2. Small sliding hiatal hernia. 3. Mild consolidation adjacent to scarring in the right lower lobe, possible atelectasis or infiltrate. 4. Cardiomegaly with a trace pericardial effusion. 5. Cystic structures in the adnexal regions bilaterally measuring up to 1.7 cm. Ultrasound is suggested for further characterization on follow-up. Electronically Signed   By: Brett Fairy M.D.   On: 05/29/2021 04:48

## 2021-05-30 NOTE — ED Notes (Signed)
Sitting up eating lunch. No complaints. No signs of distress. Son at bedside.

## 2021-05-30 NOTE — ED Notes (Signed)
Ambulatory to bathroom with assistance from son.

## 2021-05-30 NOTE — Telephone Encounter (Signed)
Left message with follow-up appointment per 1/3 los. Patient is currently at [facility] ED. Contacted them to hand patient a calendar with upcoming appointment.

## 2021-05-30 NOTE — Plan of Care (Signed)
  Problem: Education: Goal: Knowledge of General Education information will improve Description: Including pain rating scale, medication(s)/side effects and non-pharmacologic comfort measures Outcome: Progressing   Problem: Health Behavior/Discharge Planning: Goal: Ability to manage health-related needs will improve Outcome: Progressing   Problem: Nutrition: Goal: Adequate nutrition will be maintained Outcome: Progressing   

## 2021-05-30 NOTE — Progress Notes (Signed)
New Admission Note:   Arrival Method:  stretcher  Mental Orientation: alert, speaks Mhong Tenard Telemetry: expired order  Assessment: Completed Skin: dry and intact IV: left forearm NSL Pain: none  Tubes:none  Safety Measures: Safety Fall Prevention Plan has been given, discussed and signed Admission: Completed [facility] Orientation: Patient has been orientated to the room, unit and staff.  Family:  Orders have been reviewed and implemented. Will continue to monitor the patient. Call light has been placed within reach and bed alarm has been activated.   Katrina Stack, RN

## 2021-05-31 DIAGNOSIS — E871 Hypo-osmolality and hyponatremia: Secondary | ICD-10-CM | POA: Diagnosis not present

## 2021-05-31 DIAGNOSIS — U071 COVID-19: Secondary | ICD-10-CM | POA: Diagnosis not present

## 2021-05-31 DIAGNOSIS — E860 Dehydration: Secondary | ICD-10-CM | POA: Diagnosis not present

## 2021-05-31 LAB — GLUCOSE, CAPILLARY
Glucose-Capillary: 128 mg/dL — ABNORMAL HIGH (ref 70–99)
Glucose-Capillary: 179 mg/dL — ABNORMAL HIGH (ref 70–99)
Glucose-Capillary: 204 mg/dL — ABNORMAL HIGH (ref 70–99)
Glucose-Capillary: 241 mg/dL — ABNORMAL HIGH (ref 70–99)
Glucose-Capillary: 98 mg/dL (ref 70–99)

## 2021-05-31 LAB — FERRITIN: Ferritin: 333 ng/mL — ABNORMAL HIGH (ref 11–307)

## 2021-05-31 LAB — HEMOGLOBIN A1C
Hgb A1c MFr Bld: 5.8 % — ABNORMAL HIGH (ref 4.8–5.6)
Mean Plasma Glucose: 120 mg/dL

## 2021-05-31 LAB — URIC ACID, RANDOM URINE: Uric Acid, Urine: 28.5 mg/dL

## 2021-05-31 LAB — PREPARE RBC (CROSSMATCH)

## 2021-05-31 MED ORDER — FUROSEMIDE 10 MG/ML IJ SOLN
20.0000 mg | Freq: Once | INTRAMUSCULAR | Status: AC
  Administered 2021-05-31: 20 mg via INTRAVENOUS
  Filled 2021-05-31: qty 2

## 2021-05-31 MED ORDER — HYDRALAZINE HCL 50 MG PO TABS
50.0000 mg | ORAL_TABLET | Freq: Three times a day (TID) | ORAL | Status: DC
  Administered 2021-05-31 – 2021-06-02 (×4): 50 mg via ORAL
  Filled 2021-05-31 (×5): qty 1

## 2021-05-31 MED ORDER — HYDRALAZINE HCL 20 MG/ML IJ SOLN
10.0000 mg | Freq: Four times a day (QID) | INTRAMUSCULAR | Status: DC | PRN
  Administered 2021-05-31: 10 mg via INTRAVENOUS
  Filled 2021-05-31: qty 1

## 2021-05-31 MED ORDER — CARVEDILOL 6.25 MG PO TABS
6.2500 mg | ORAL_TABLET | Freq: Two times a day (BID) | ORAL | Status: DC
  Administered 2021-05-31 – 2021-06-01 (×2): 6.25 mg via ORAL
  Filled 2021-05-31 (×2): qty 1

## 2021-05-31 MED ORDER — SODIUM CHLORIDE 0.9% IV SOLUTION: Freq: Once | INTRAVENOUS | Status: DC

## 2021-05-31 MED ORDER — DIPHENHYDRAMINE HCL 50 MG/ML IJ SOLN: 25.0000 mg | Freq: Four times a day (QID) | INTRAMUSCULAR | Status: DC | PRN

## 2021-05-31 MED ORDER — CARVEDILOL 3.125 MG PO TABS
3.1250 mg | ORAL_TABLET | Freq: Once | ORAL | Status: AC
  Administered 2021-05-31: 3.125 mg via ORAL
  Filled 2021-05-31: qty 1

## 2021-05-31 NOTE — Evaluation (Signed)
Occupational Therapy Evaluation Patient Details Name: Olivia Williamson MRN: [id number] DOB: 1946-04-20 Today's Date: 05/31/2021   History of Present Illness 76 y.o. female presenting 05/29/21 with headache and abdominal pain. +COVID;  PMH  significant of DM; HTN; and tuberculosis   Clinical Impression   Pt admitted for concerns listed above. PTA pt was independent with all ADL's and IADL's, using a cane. At this time, pt continues to demonstrate independence with all ADL's and functional mobility. Pt and son report they feel that she is at her baseline and has no further OT needs. Acute OT will sign off.   Session partially interpreted by Son, as no [place] interpreter was available this weekend.       Recommendations for follow up therapy are one component of a multi-disciplinary discharge planning process, led by the attending physician.  Recommendations may be updated based on patient status, additional functional criteria and insurance authorization.   Follow Up Recommendations  No OT follow up    Assistance Recommended at Discharge None  Patient can return home with the following      Functional Status Assessment  Patient has not had a recent decline in their functional status  Equipment Recommendations  None recommended by OT    Recommendations for Other Services       Precautions / Restrictions Precautions Precautions: Fall Restrictions Weight Bearing Restrictions: No      Mobility Bed Mobility Overal bed mobility: Independent             General bed mobility comments: supine to sit and then sit to supine    Transfers Overall transfer level: Independent Equipment used: None               General transfer comment: stood without use of cane; handed cane to pt for ambulation      Balance Overall balance assessment: Modified Independent                                         ADL either performed or assessed with clinical judgement    ADL Overall ADL's : At baseline;Modified independent                                       General ADL Comments: Pt able to complete toileting and dressing with no difficulties this session. Balance and fine/gross motor WFL.     Vision Baseline Vision/History: 0 No visual deficits Ability to See in Adequate Light: 0 Adequate Patient Visual Report: No change from baseline Vision Assessment?: No apparent visual deficits     Perception     Praxis      Pertinent Vitals/Pain Pain Assessment: PAINAD Breathing: normal Negative Vocalization: none Facial Expression: smiling or inexpressive Body Language: relaxed Consolability: no need to console PAINAD Score: 0 Pain Intervention(s): Monitored during session     Hand Dominance     Extremity/Trunk Assessment Upper Extremity Assessment Upper Extremity Assessment: Overall WFL for tasks assessed   Lower Extremity Assessment Lower Extremity Assessment: Overall WFL for tasks assessed   Cervical / Trunk Assessment Cervical / Trunk Assessment: Normal   Communication Communication Communication: Prefers language other than [place] ([place] with no availability for interpreter (language not on virtual interpreter system; rely on family or locals for translation); Son with incomplete understanding of English and could not  provide all information needed)   Cognition Arousal/Alertness: Awake/alert Behavior During Therapy: WFL for tasks assessed/performed Overall Cognitive Status: Difficult to assess                                       General Comments  VSS on RA    Exercises     Shoulder Instructions      Home Living Family/patient expects to be discharged to:: Private residence Living Arrangements: Children (daughter) Available Help at Discharge: Family;Available PRN/intermittently                         Home Equipment: Gilmer Mor - single point   Additional Comments: Son present  reports he does not know the home set-up where pt lives with her daughter.      Prior Functioning/Environment Prior Level of Function : Independent/Modified Independent             Mobility Comments: modified independent with ambulation with cane          OT Problem List: Cardiopulmonary status limiting activity;Decreased activity tolerance;Impaired balance (sitting and/or standing)      OT Treatment/Interventions:      OT Goals(Current goals can be found in the care plan section) Acute Rehab OT Goals Patient Stated Goal: None stated OT Goal Formulation: With family Time For Goal Achievement: 05/31/21 Potential to Achieve Goals: Good  OT Frequency:      Co-evaluation              AM-PAC OT "6 Clicks" Daily Activity     Outcome Measure Help from another person eating meals?: None Help from another person taking care of personal grooming?: None Help from another person toileting, which includes using toliet, bedpan, or urinal?: None Help from another person bathing (including washing, rinsing, drying)?: None Help from another person to put on and taking off regular upper body clothing?: None Help from another person to put on and taking off regular lower body clothing?: None 6 Click Score: 24   End of Session Nurse Communication: Mobility status  Activity Tolerance: Patient tolerated treatment well Patient left: in bed;with call bell/phone within reach;with family/visitor present  OT Visit Diagnosis: Muscle weakness (generalized) (M62.81);Other abnormalities of gait and mobility (R26.89)                Time: 6269-4854 OT Time Calculation (min): 17 min Charges:  OT General Charges $OT Visit: 1 Visit OT Evaluation $OT Eval Low Complexity: 1 Low  Mali Eppard H., OTR/L Acute Rehabilitation  Mylen Mangan Elane Bing Plume 05/31/2021, 5:31 PM

## 2021-05-31 NOTE — Progress Notes (Signed)
PROGRESS NOTE                                                                                                                                                                                                             Patient Demographics:    Olivia Williamson, is a 76 y.o. female, DOB - Feb 14, 1946, [id number]  Outpatient Primary MD for the patient is [name], MD    LOS - 2  Admit date - 05/28/2021    Chief Complaint  Patient presents with   Altered Mental Status       Brief Narrative (HPI from H&P)  : Olivia Williamson is a 76 y.o. female with medical history significant of DM; HTN; and tuberculosis presenting with headache and abdominal pain.  The patient's son reports that she has periods of "sickness" that have happened for the last 4-5 years.  She goes to bed for several days at a time.  Over the last 1-2 days she has complained of headache, n/v and chills without fever.   He thought it was related to her usual sickness.  She came to the ER yesterday for abdominal pain, headache, and nausea; she developed n/v and cough with post-tussive emesis while in the ER, in the ER her work-up was suggestive of COVID-19 infection likely incidental, abdominal pain with nausea vomiting, severe hyponatremia due to dehydration and she was admitted.   Subjective:   Patient in bed, appears comfortable, denies any headache, no fever, no chest pain or pressure, no shortness of breath , no abdominal pain. No new focal weakness, does have ongoing generalized weakness.   Assessment  & Plan :     Dehydration with hyponatremia-resolved after IV fluids in the ER.  Stable now.  2.  Abdominal pain with nausea vomiting transient in the ER.  CT scan abdomen pelvis and abdominal exam are unremarkable, she is now symptom-free continue to monitor with supportive care and advance diet.  3.  Chronic microcytic anemia.  Check anemia panel, placed on oral  iron and folic acid, 2 units packed RBC transfusion on 05/31/2021 for generalized fatigue and symptomatic chronic anemia.  See #4 below.  4.  Pancytopenia.  This is somewhat of a chronic problem ongoing for several months, peripheral smear lab tech comments noted, pathologist review is still pending, teardrop cells found, discussed with heme-onc  over the phone [name] 05/31/2021, transfused 2 units with outpatient follow-up with Dr. [name] question if this is myelofibrosis.  6.  Incidental COVID-19 infection.  Supportive care.  She is already been started on Paxlovid.  7.  DM type II.  Home regimen and monitor.  Lab Results  Component Value Date   HGBA1C 5.8 (H) 05/30/2021    CBG (last 3)  Recent Labs    05/30/21 2052 05/30/21 2359 05/31/21 0632  GLUCAP 404* 128* 98          Condition - Extremely Guarded  Family Communication  : Son Bedside 05/30/2021, 05/31/2021  Code Status :  Full  Consults  :  None  PUD Prophylaxis : PPI   Procedures  :     CT Head  - Non acute  CT -  1. No acute intra-abdominal process. 2. Small sliding hiatal hernia. 3. Mild consolidation adjacent to scarring in the right lower lobe, possible atelectasis or infiltrate. 4. Cardiomegaly with a trace pericardial effusion. 5. Cystic structures in the adnexal regions bilaterally measuring up to 1.7 cm. Ultrasound is suggested for further characterization on follow-up.      Disposition Plan  :    Status is: Inpatient  Remains inpatient appropriate because: Hyponatremia   DVT Prophylaxis  :    enoxaparin (LOVENOX) injection 30 mg Start: 05/31/21 1000  Lab Results  Component Value Date   PLT PLATELET CLUMPS NOTED ON SMEAR, UNABLE TO ESTIMATE 05/30/2021    Diet :  Diet Order             Diet Carb Modified Fluid consistency: Thin; Room service appropriate? Yes  Diet effective now                    Inpatient Medications  Scheduled Meds:  sodium chloride   Intravenous Once   vitamin C   500 mg Oral Daily   carvedilol  3.125 mg Oral Once   carvedilol  6.25 mg Oral BID WC   enoxaparin (LOVENOX) injection  30 mg Subcutaneous Q24H   ferrous sulfate  325 mg Oral BID WC   folic acid  1 mg Oral Daily   furosemide  20 mg Intravenous Once   insulin aspart  0-5 Units Subcutaneous QHS   insulin aspart  0-9 Units Subcutaneous TID WC   insulin glargine-yfgn  34 Units Subcutaneous QHS   losartan  25 mg Oral Daily   nirmatrelvir/ritonavir EUA (renal dosing)  2 tablet Oral BID   zinc sulfate  220 mg Oral Daily   Continuous Infusions:   PRN Meds:.acetaminophen, albuterol, bisacodyl, chlorpheniramine-HYDROcodone, diphenhydrAMINE, guaiFENesin-dextromethorphan, [DISCONTINUED] ondansetron **OR** ondansetron (ZOFRAN) IV, polyethylene glycol, sodium phosphate  Antibiotics  :    Anti-infectives (From admission, onward)    Start     Dose/Rate Route Frequency Ordered Stop   05/29/21 1300  nirmatrelvir/ritonavir EUA (renal dosing) (PAXLOVID) 2 tablet        2 tablet Oral 2 times daily 05/29/21 1249 06/03/21 0959   05/29/21 1000  nirmatrelvir/ritonavir EUA (PAXLOVID) 3 tablet  Status:  Discontinued        3 tablet Oral 2 times daily 05/29/21 0949 05/29/21 1248        Time Spent in minutes  30   Lala Lund M.D on 05/31/2021 at 9:29 AM  To page go to [email or web address]   [facility] -  Office  [phone]  See all Orders from today for further details    Objective:   Vitals:  05/30/21 1838 05/30/21 2048 05/30/21 2102 05/31/21 0424  BP: (!) 185/73 (!) 185/74 (!) 181/82 (!) 159/62  Pulse: 81 82 84 71  Resp: (!) 22 16  16   Temp: 98.7 F (37.1 C) 98.7 F (37.1 C)  98 F (36.7 C)  TempSrc: Oral Oral  Oral  SpO2: 99% 100%  98%  Weight:      Height:        Wt Readings from Last 3 Encounters:  05/28/21 46.3 kg  05/27/21 46.6 kg  04/14/21 46.7 kg     Intake/Output Summary (Last 24 hours) at 05/31/2021 0929 Last data filed at 05/30/2021 2045 Gross per 24 hour   Intake 540 ml  Output --  Net 540 ml     Physical Exam  Awake Alert, No new F.N deficits, Normal affect [place].AT,PERRAL Supple Neck, No JVD,   Symmetrical Chest wall movement, Good air movement bilaterally, CTAB RRR,No Gallops,Rubs or new Murmurs,  +ve B.Sounds, Abd Soft, No tenderness,   No Cyanosis, Clubbing or edema       Data Review:    CBC Recent Labs  Lab 05/27/21 1200 05/27/21 1202 05/28/21 1611 05/30/21 0618  WBC 2.1*  --  2.5* 2.4*  HGB 7.3*  --  7.3* 7.4*  HCT 22.7* 23.4* 23.1* 22.4*  PLT 82*  --  79* PLATELET CLUMPS NOTED ON SMEAR, UNABLE TO ESTIMATE  MCV 63.1*  --  63.1* 62.7*  MCH 20.3*  --  19.9* 20.7*  MCHC 32.2  --  31.6 33.0  RDW 16.0*  --  16.0* 15.8*  LYMPHSABS 0.7  --  1.0 0.3*  MONOABS 0.3  --  0.3 0.0*  EOSABS 0.0  --  0.0 0.0  BASOSABS 0.0  --  0.0 0.0    Electrolytes Recent Labs  Lab 05/27/21 1200 05/28/21 1611 05/29/21 1345 05/30/21 0315 05/30/21 1001 05/30/21 1913  NA 126* 122*  --  133*  --   --   K 4.0 4.2  --  4.5  --   --   CL 94* 91*  --  100  --   --   CO2 24 23  --  25  --   --   GLUCOSE 140* 192*  --  213*  --  437*  BUN 46* 32*  --  31*  --   --   CREATININE 1.21* 1.01*  --  1.19*  --   --   CALCIUM 8.3* 8.6*  --  9.0  --   --   AST 56* 62*  --  44*  --   --   ALT 67* 71*  --  65*  --   --   ALKPHOS 67 63  --  68  --   --   BILITOT 0.5 0.8  --  0.6  --   --   ALBUMIN 3.6 3.1*  --  3.2*  --   --   MG  --   --   --  1.9  --   --   CRP  --   --  0.5 0.6  --   --   DDIMER  --   --  0.53* 0.50  --   --   PROCALCITON  --   --  <0.10  --   --   --   TSH  --   --   --   --  0.460  --   HGBA1C  --   --   --   --  5.8*  --     ------------------------------------------------------------------------------------------------------------------ No results for input(s): CHOL, HDL, LDLCALC, TRIG, CHOLHDL, LDLDIRECT in the last 72 hours.  Lab Results  Component Value Date   HGBA1C 5.8 (H) 05/30/2021    Recent Labs     05/30/21 1001  TSH 0.460   ------------------------------------------------------------------------------------------------------------------ ID Labs Recent Labs  Lab 05/27/21 1200 05/28/21 1611 05/29/21 1345 05/30/21 0315 05/30/21 0618  WBC 2.1* 2.5*  --   --  2.4*  PLT 82* 79*  --   --  PLATELET CLUMPS NOTED ON SMEAR, UNABLE TO ESTIMATE  CRP  --   --  0.5 0.6  --   DDIMER  --   --  0.53* 0.50  --   PROCALCITON  --   --  <0.10  --   --   CREATININE 1.21* 1.01*  --  1.19*  --    Cardiac Enzymes No results for input(s): CKMB, TROPONINI, MYOGLOBIN in the last 168 hours.  Invalid input(s): CK   Radiology Reports DG Chest 2 View  Result Date: 05/29/2021 CLINICAL DATA:  COVID. Hx of DM, GERD, HTN, TB. Pt is a nonsmoker. EXAM: CHEST - 2 VIEW COMPARISON:  Chest x-ray 03/16/2019, CT chest 03/16/2019 FINDINGS: The heart and mediastinal contours are unchanged. Aortic calcification. Low lung volumes. Hazy airspace opacity within the left lower lung zone likely due to artifact from overlying soft tissues. No pulmonary edema. No pleural effusion. No pneumothorax. No acute osseous abnormality. IMPRESSION: Low lung volumes.  No active cardiopulmonary disease. Electronically Signed   By: Iven Finn M.D.   On: 05/29/2021 15:43   CT Head Wo Contrast  Result Date: 05/28/2021 CLINICAL DATA:  Headache.  Neuro deficit. EXAM: CT HEAD WITHOUT CONTRAST TECHNIQUE: Contiguous axial images were obtained from the base of the skull through the vertex without intravenous contrast. COMPARISON:  CT head 05/11/2016 FINDINGS: Brain: No evidence of acute infarction, hemorrhage, hydrocephalus, extra-axial collection or mass lesion/mass effect. Vascular: Negative for hyperdense vessel Skull: Negative Sinuses/Orbits: Moderate mucosal edema throughout the paranasal sinuses. Bilateral cataract extraction Other: None IMPRESSION: Negative CT head Moderate sinus mucosal edema. Electronically Signed   By: Franchot Gallo M.D.    On: 05/28/2021 17:04   CT ABDOMEN PELVIS W CONTRAST  Result Date: 05/29/2021 CLINICAL DATA:  Epigastric pain. EXAM: CT ABDOMEN AND PELVIS WITH CONTRAST TECHNIQUE: Multidetector CT imaging of the abdomen and pelvis was performed using the standard protocol following bolus administration of intravenous contrast. CONTRAST:  27mL OMNIPAQUE IOHEXOL 300 MG/ML  SOLN COMPARISON:  03/16/2019. FINDINGS: Lower chest: The heart is enlarged and there is a trace pericardial effusion. There is atelectasis at the left lung base. Patchy airspace disease with associated scarring noted at the right lung base. Hepatobiliary: A few subcentimeter hypodensities are present in the liver which are unchanged from the prior exam. No biliary ductal dilatation. The gallbladder is surgically absent. Pancreas: Unremarkable. No pancreatic ductal dilatation or surrounding inflammatory changes. Spleen: Normal in size without focal abnormality. Adrenals/Urinary Tract: Adrenal glands are unremarkable. Kidneys are normal, without renal calculi, focal lesion, or hydronephrosis. Bladder is unremarkable. Stomach/Bowel: There is a small hiatal hernia. No bowel obstruction, free air or pneumatosis. A normal appendix is seen in the right lower quadrant. No focal bowel wall thickening. Vascular/Lymphatic: Aortic atherosclerosis. No enlarged abdominal or pelvic lymph nodes. Reproductive: The uterus is in-situ. A small cystic structure is seen in the right adnexa measuring 1.7 cm. There is a small cystic structure in the left adnexa measuring 1.5 cm. Other:  No free fluid. Musculoskeletal: Degenerative changes are present in the thoracolumbar spine. There are compression deformities with vertebral plana at T12 and L1, unchanged from 2020. IMPRESSION: 1. No acute intra-abdominal process. 2. Small sliding hiatal hernia. 3. Mild consolidation adjacent to scarring in the right lower lobe, possible atelectasis or infiltrate. 4. Cardiomegaly with a trace  pericardial effusion. 5. Cystic structures in the adnexal regions bilaterally measuring up to 1.7 cm. Ultrasound is suggested for further characterization on follow-up. Electronically Signed   By: Brett Fairy M.D.   On: 05/29/2021 04:48

## 2021-05-31 NOTE — Evaluation (Signed)
Physical Therapy Evaluation and Discharge  Patient Details Name: Olivia Williamson MRN: [id number] DOB: 25-Dec-1945 Today's Date: 05/31/2021  History of Present Illness  76 y.o. female presenting 05/29/21 with headache and abdominal pain. +COVID;  PMH  significant of DM; HTN; and tuberculosis  Clinical Impression   Patient evaluated by Physical Therapy with no further acute PT needs identified. Family present and able to partially interpret ([place] language not available per virtual interpreter service). Patient mobilizing OOB and onto her feet independently and walking with cane modified independent x 140 ft. Son agrees she is at her baseline for mobility.  PT is signing off. Thank you for this referral.        Recommendations for follow up therapy are one component of a multi-disciplinary discharge planning process, led by the attending physician.  Recommendations may be updated based on patient status, additional functional criteria and insurance authorization.  Follow Up Recommendations No PT follow up    Assistance Recommended at Discharge PRN  Patient can return home with the following       Equipment Recommendations None recommended by PT  Recommendations for Other Services       Functional Status Assessment Patient has not had a recent decline in their functional status     Precautions / Restrictions Precautions Precautions: Fall      Mobility  Bed Mobility Overal bed mobility: Independent             General bed mobility comments: supine to sit and then sit to supine    Transfers Overall transfer level: Independent Equipment used: None               General transfer comment: stood without use of cane; handed cane to pt for ambulation    Ambulation/Gait Ambulation/Gait assistance: Modified independent (Device/Increase time) Gait Distance (Feet): 140 Feet Assistive device: Straight cane Gait Pattern/deviations: WFL(Within Functional Limits) Gait  velocity: WFL for environment     General Gait Details: no imbalance noted with multiple turns (walking in room due to COVID diagnosis)  Stairs            Wheelchair Mobility    Modified Rankin (Stroke Patients Only)       Balance Overall balance assessment: Modified Independent                                           Pertinent Vitals/Pain Pain Assessment: PAINAD Breathing: normal Negative Vocalization: none Facial Expression: smiling or inexpressive Body Language: relaxed Consolability: no need to console PAINAD Score: 0    Home Living Family/patient expects to be discharged to:: Private residence Living Arrangements: Children (daughter) Available Help at Discharge: Family;Available PRN/intermittently             Home Equipment: Gilmer Mor - single point Additional Comments: Son present reports he does not know the home set-up where pt lives with her daughter.    Prior Function Prior Level of Function : Independent/Modified Independent             Mobility Comments: modified independent with ambulation with cane       Hand Dominance        Extremity/Trunk Assessment   Upper Extremity Assessment Upper Extremity Assessment: Overall WFL for tasks assessed    Lower Extremity Assessment Lower Extremity Assessment: Overall WFL for tasks assessed    Cervical / Trunk Assessment Cervical / Trunk Assessment: Normal  Communication   Communication: Prefers language other than English Engineer, materials with no availability for interpreter (language not on virtual interpreter system; rely on family or locals for translation); Son with incomplete understanding of English and could not provide all information needed)  Cognition Arousal/Alertness: Awake/alert Behavior During Therapy: WFL for tasks assessed/performed Overall Cognitive Status: Difficult to assess                                          General Comments General  comments (skin integrity, edema, etc.): Per chart, RN, and son patient has been ambulating to bathroom with son's assist (with cane) without difficulty    Exercises     Assessment/Plan    PT Assessment Patient does not need any further PT services  PT Problem List         PT Treatment Interventions      PT Goals (Current goals can be found in the Care Plan section)  Acute Rehab PT Goals Patient Stated Goal: unable to state; no goals set PT Goal Formulation: All assessment and education complete, DC therapy    Frequency       Co-evaluation               AM-PAC PT "6 Clicks" Mobility  Outcome Measure Help needed turning from your back to your side while in a flat bed without using bedrails?: None Help needed moving from lying on your back to sitting on the side of a flat bed without using bedrails?: None Help needed moving to and from a bed to a chair (including a wheelchair)?: None Help needed standing up from a chair using your arms (e.g., wheelchair or bedside chair)?: None Help needed to walk in hospital room?: None Help needed climbing 3-5 steps with a railing? : None 6 Click Score: 24    End of Session   Activity Tolerance: Patient tolerated treatment well Patient left: in bed;with call bell/phone within reach;with family/visitor present Nurse Communication: Mobility status;Other (comment) (denies dizziness; no PT needs identified) PT Visit Diagnosis: Difficulty in walking, not elsewhere classified (R26.2)    Time: 1012-1022 PT Time Calculation (min) (ACUTE ONLY): 10 min   Charges:   PT Evaluation $PT Eval Low Complexity: 1 Low           Olivia Williamson, PT Acute Rehabilitation Services  Pager [phone] Office [phone]   Olivia Williamson 05/31/2021, 10:34 AM

## 2021-05-31 NOTE — Progress Notes (Signed)
Md Candiss Norse aware of elevated BP. Scheduled and PRN hydralazine ordered.

## 2021-06-01 DIAGNOSIS — E871 Hypo-osmolality and hyponatremia: Secondary | ICD-10-CM | POA: Diagnosis not present

## 2021-06-01 DIAGNOSIS — E860 Dehydration: Secondary | ICD-10-CM | POA: Diagnosis not present

## 2021-06-01 DIAGNOSIS — U071 COVID-19: Secondary | ICD-10-CM | POA: Diagnosis not present

## 2021-06-01 LAB — CBC
HCT: 34 % — ABNORMAL LOW (ref 36.0–46.0)
Hemoglobin: 11.6 g/dL — ABNORMAL LOW (ref 12.0–15.0)
MCH: 23.8 pg — ABNORMAL LOW (ref 26.0–34.0)
MCHC: 34.1 g/dL (ref 30.0–36.0)
MCV: 69.8 fL — ABNORMAL LOW (ref 80.0–100.0)
Platelets: 83 10*3/uL — ABNORMAL LOW (ref 150–400)
RBC: 4.87 MIL/uL (ref 3.87–5.11)
RDW: 23.6 % — ABNORMAL HIGH (ref 11.5–15.5)
WBC: 9.2 10*3/uL (ref 4.0–10.5)
nRBC: 0.2 % (ref 0.0–0.2)

## 2021-06-01 LAB — BASIC METABOLIC PANEL
Anion gap: 7 (ref 5–15)
BUN: 45 mg/dL — ABNORMAL HIGH (ref 8–23)
CO2: 26 mmol/L (ref 22–32)
Calcium: 9.2 mg/dL (ref 8.9–10.3)
Chloride: 101 mmol/L (ref 98–111)
Creatinine, Ser: 1.65 mg/dL — ABNORMAL HIGH (ref 0.44–1.00)
GFR, Estimated: 33 mL/min — ABNORMAL LOW (ref >60)
Glucose, Bld: 122 mg/dL — ABNORMAL HIGH (ref 70–99)
Potassium: 4 mmol/L (ref 3.5–5.1)
Sodium: 134 mmol/L — ABNORMAL LOW (ref 135–145)

## 2021-06-01 LAB — CREATININE, URINE, RANDOM: Creatinine, Urine: 194.16 mg/dL

## 2021-06-01 LAB — DIC (DISSEMINATED INTRAVASCULAR COAGULATION)PANEL
D-Dimer, Quant: 0.4 ug/mL-FEU (ref 0.00–0.50)
Fibrinogen: 317 mg/dL (ref 210–475)
INR: 1 (ref 0.8–1.2)
Platelets: 92 10*3/uL — ABNORMAL LOW (ref 150–400)
Prothrombin Time: 13 seconds (ref 11.4–15.2)
Smear Review: NONE SEEN
aPTT: 28 seconds (ref 24–36)

## 2021-06-01 LAB — BPAM RBC
Blood Product Expiration Date: 202301072359
Blood Product Expiration Date: 202302012359
ISSUE DATE / TIME: 202301071125
ISSUE DATE / TIME: 202301071415
Unit Type and Rh: 5100
Unit Type and Rh: 9500

## 2021-06-01 LAB — TYPE AND SCREEN
ABO/RH(D): O POS
Antibody Screen: NEGATIVE
Unit division: 0

## 2021-06-01 LAB — SODIUM, URINE, RANDOM: Sodium, Ur: 69 mmol/L

## 2021-06-01 LAB — GLUCOSE, CAPILLARY
Glucose-Capillary: 51 mg/dL — ABNORMAL LOW (ref 70–99)
Glucose-Capillary: 94 mg/dL (ref 70–99)
Glucose-Capillary: 276 mg/dL — ABNORMAL HIGH (ref 70–99)
Glucose-Capillary: 81 mg/dL (ref 70–99)
Glucose-Capillary: 71 mg/dL (ref 70–99)

## 2021-06-01 LAB — OSMOLALITY, URINE: Osmolality, Ur: 558 mOsm/kg (ref 300–900)

## 2021-06-01 LAB — URIC ACID: Uric Acid, Serum: 4.4 mg/dL (ref 2.5–7.1)

## 2021-06-01 LAB — HEMOGLOBIN AND HEMATOCRIT, BLOOD
HCT: 33.7 % — ABNORMAL LOW (ref 36.0–46.0)
Hemoglobin: 11.3 g/dL — ABNORMAL LOW (ref 12.0–15.0)

## 2021-06-01 LAB — TECHNOLOGIST SMEAR REVIEW

## 2021-06-01 LAB — FERRITIN: Ferritin: 423 ng/mL — ABNORMAL HIGH (ref 11–307)

## 2021-06-01 LAB — OSMOLALITY: Osmolality: 306 mOsm/kg — ABNORMAL HIGH (ref 275–295)

## 2021-06-01 MED ORDER — LACTATED RINGERS IV SOLN: INTRAVENOUS | Status: AC

## 2021-06-01 MED ORDER — CARVEDILOL 3.125 MG PO TABS
3.1250 mg | ORAL_TABLET | Freq: Two times a day (BID) | ORAL | Status: DC
  Administered 2021-06-01: 3.125 mg via ORAL
  Filled 2021-06-01: qty 1

## 2021-06-01 MED ORDER — INSULIN GLARGINE-YFGN 100 UNIT/ML ~~LOC~~ SOLN
25.0000 [IU] | Freq: Every day | SUBCUTANEOUS | Status: DC
  Administered 2021-06-01: 25 [IU] via SUBCUTANEOUS
  Filled 2021-06-01: qty 0.25

## 2021-06-01 NOTE — Progress Notes (Signed)
Hypoglycemic Event  CBG: 51 at 0630    Treatment: 8 oz juice/soda pt/son requested strawberry [place]  Symptoms: None  Follow-up CBG: Time: [id number] CBG Result:94  Possible Reasons for Event: Inadequate meal intake and pt had episode of nausea around 1am.  Comments/MD notified:Dr. Lala Lund notified via secure chat.   Ayesha Mohair BSN RN CMSRN

## 2021-06-01 NOTE — Progress Notes (Signed)
PROGRESS NOTE                                                                                                                                                                                                             Patient Demographics:    Olivia Williamson, is a 76 y.o. female, DOB - June 10, 1945, RI:[id number]  Outpatient Primary MD for the patient is [name], MD    LOS - 3  Admit date - 05/28/2021    Chief Complaint  Patient presents with   Altered Mental Status       Brief Narrative (HPI from H&P)  : Olivia Williamson is a 76 y.o. female with medical history significant of DM; HTN; and tuberculosis presenting with headache and abdominal pain.  The patient's son reports that she has periods of "sickness" that have happened for the last 4-5 years.  She goes to bed for several days at a time.  Over the last 1-2 days she has complained of headache, n/v and chills without fever.   He thought it was related to her usual sickness.  She came to the ER yesterday for abdominal pain, headache, and nausea; she developed n/v and cough with post-tussive emesis while in the ER, in the ER her work-up was suggestive of COVID-19 infection likely incidental, abdominal pain with nausea vomiting, severe hyponatremia due to dehydration and she was admitted.   Subjective:   Patient in bed, appears comfortable, denies any headache, no fever, no chest pain or pressure, no shortness of breath , no abdominal pain. No new focal weakness, does have ongoing generalized weakness.   Assessment  & Plan :     Dehydration with hyponatremia-resolved after IV fluids, now in AKI as not eating or drinking hospital food, start on IV fluids on 06/01/2021, bladder scan is stable without any evidence of bladder outflow obstruction, obtain UA, hold ARB and monitor.  2.  Abdominal pain with nausea vomiting transient in the ER.  CT scan abdomen pelvis and abdominal exam are  unremarkable, she is now symptom-free continue to monitor with supportive care and advance diet.  3.  Chronic microcytic anemia.  Check anemia panel, placed on oral iron and folic acid, 2 units packed RBC transfusion on 05/31/2021 for generalized fatigue and symptomatic chronic anemia.  See #4 below.  4.  Pancytopenia.  This  is somewhat of a chronic problem ongoing for several months, peripheral smear lab tech comments noted, pathologist review is still pending, teardrop cells found, discussed with heme-onc over the phone [name] 05/31/2021, transfused 2 units on 05/31/2021 with outpatient follow-up with Dr. [name] question if this is myelofibrosis.  6.  Incidental COVID-19 infection.  Supportive care.  She is already been started on Paxlovid.  7.  Hypertension.  On Coreg dose adjusted.  Discontinue ARB due to AKI.    8. DM type II.  Was on home regimen but became hypoglycemic morning of 06/01/2021, not eating hospital food, regimen adjusted.  Will liberalize her diet.  Lab Results  Component Value Date   HGBA1C 5.8 (H) 05/30/2021    CBG (last 3)  Recent Labs    05/31/21 2125 06/01/21 0627 06/01/21 0657  GLUCAP 179* 51* 94          Condition - Extremely Guarded  Family Communication  : Son Bedside 05/30/2021, 05/31/2021  Code Status :  Full  Consults  :  None  PUD Prophylaxis : PPI   Procedures  :     CT Head  - Non acute  CT -  1. No acute intra-abdominal process. 2. Small sliding hiatal hernia. 3. Mild consolidation adjacent to scarring in the right lower lobe, possible atelectasis or infiltrate. 4. Cardiomegaly with a trace pericardial effusion. 5. Cystic structures in the adnexal regions bilaterally measuring up to 1.7 cm. Ultrasound is suggested for further characterization on follow-up.      Disposition Plan  :    Status is: Inpatient  Remains inpatient appropriate because: Hyponatremia   DVT Prophylaxis  :    enoxaparin (LOVENOX) injection 30 mg Start: 05/31/21  1000  Lab Results  Component Value Date   PLT 92 (L) 06/01/2021    Diet :  Diet Order             Diet Carb Modified Fluid consistency: Thin; Room service appropriate? Yes  Diet effective now                    Inpatient Medications  Scheduled Meds:  sodium chloride   Intravenous Once   vitamin C  500 mg Oral Daily   carvedilol  3.125 mg Oral BID WC   enoxaparin (LOVENOX) injection  30 mg Subcutaneous Q24H   ferrous sulfate  325 mg Oral BID WC   folic acid  1 mg Oral Daily   hydrALAZINE  50 mg Oral Q8H   insulin aspart  0-9 Units Subcutaneous TID WC   insulin glargine-yfgn  25 Units Subcutaneous QHS   nirmatrelvir/ritonavir EUA (renal dosing)  2 tablet Oral BID   zinc sulfate  220 mg Oral Daily   Continuous Infusions:  lactated ringers 100 mL/hr at 06/01/21 0917    PRN Meds:.acetaminophen, albuterol, bisacodyl, chlorpheniramine-HYDROcodone, guaiFENesin-dextromethorphan, hydrALAZINE, [DISCONTINUED] ondansetron **OR** ondansetron (ZOFRAN) IV, polyethylene glycol, sodium phosphate  Antibiotics  :    Anti-infectives (From admission, onward)    Start     Dose/Rate Route Frequency Ordered Stop   05/29/21 1300  nirmatrelvir/ritonavir EUA (renal dosing) (PAXLOVID) 2 tablet        2 tablet Oral 2 times daily 05/29/21 1249 06/03/21 0959   05/29/21 1000  nirmatrelvir/ritonavir EUA (PAXLOVID) 3 tablet  Status:  Discontinued        3 tablet Oral 2 times daily 05/29/21 0949 05/29/21 1248        Time Spent in minutes  30   Lavetta Geier  Candiss Norse M.D on 06/01/2021 at 10:25 AM  To page go to [email or web address]   [facility] -  Office  [phone]  See all Orders from today for further details    Objective:   Vitals:   05/31/21 1830 05/31/21 2005 06/01/21 0625 06/01/21 1004  BP: (!) 186/72 (!) 119/42 121/64 (!) 127/56  Pulse:  76 73 79  Resp:  18 18 18   Temp:  99 F (37.2 C) 98.4 F (36.9 C) 98.7 F (37.1 C)  TempSrc:  Oral Oral Oral  SpO2:  98% 98% 93%  Weight:    45.1 kg   Height:        Wt Readings from Last 3 Encounters:  06/01/21 45.1 kg  05/27/21 46.6 kg  04/14/21 46.7 kg     Intake/Output Summary (Last 24 hours) at 06/01/2021 1025 Last data filed at 06/01/2021 0900 Gross per 24 hour  Intake 1595 ml  Output 1250 ml  Net 345 ml     Physical Exam  Awake Alert, No new F.N deficits, Normal affect [place].AT,PERRAL Supple Neck, No JVD,   Symmetrical Chest wall movement, Good air movement bilaterally, CTAB RRR,No Gallops, Rubs or new Murmurs,  +ve B.Sounds, Abd Soft, No tenderness,   No Cyanosis, Clubbing or edema      Data Review:    CBC Recent Labs  Lab 05/27/21 1200 05/27/21 1202 05/28/21 1611 05/30/21 0618 05/31/21 1936 06/01/21 0233 06/01/21 0829  WBC 2.1*  --  2.5* 2.4*  --  9.2  --   HGB 7.3*  --  7.3* 7.4* 11.3* 11.6*  --   HCT 22.7* 23.4* 23.1* 22.4* 33.7* 34.0*  --   PLT 82*  --  79* PLATELET CLUMPS NOTED ON SMEAR, UNABLE TO ESTIMATE  --  83* 92*  MCV 63.1*  --  63.1* 62.7*  --  69.8*  --   MCH 20.3*  --  19.9* 20.7*  --  23.8*  --   MCHC 32.2  --  31.6 33.0  --  34.1  --   RDW 16.0*  --  16.0* 15.8*  --  23.6*  --   LYMPHSABS 0.7  --  1.0 0.3*  --   --   --   MONOABS 0.3  --  0.3 0.0*  --   --   --   EOSABS 0.0  --  0.0 0.0  --   --   --   BASOSABS 0.0  --  0.0 0.0  --   --   --     Electrolytes Recent Labs  Lab 05/27/21 1200 05/28/21 1611 05/29/21 1345 05/30/21 0315 05/30/21 1001 05/30/21 1913 06/01/21 0233 06/01/21 0829  NA 126* 122*  --  133*  --   --  134*  --   K 4.0 4.2  --  4.5  --   --  4.0  --   CL 94* 91*  --  100  --   --  101  --   CO2 24 23  --  25  --   --  26  --   GLUCOSE 140* 192*  --  213*  --  437* 122*  --   BUN 46* 32*  --  31*  --   --  45*  --   CREATININE 1.21* 1.01*  --  1.19*  --   --  1.65*  --   CALCIUM 8.3* 8.6*  --  9.0  --   --  9.2  --   AST  56* 62*  --  44*  --   --   --   --   ALT 67* 71*  --  65*  --   --   --   --   ALKPHOS 67 63  --  68  --   --   --   --    BILITOT 0.5 0.8  --  0.6  --   --   --   --   ALBUMIN 3.6 3.1*  --  3.2*  --   --   --   --   MG  --   --   --  1.9  --   --   --   --   CRP  --   --  0.5 0.6  --   --   --   --   DDIMER  --   --  0.53* 0.50  --   --   --  0.40  PROCALCITON  --   --  <0.10  --   --   --   --   --   INR  --   --   --   --   --   --   --  1.0  TSH  --   --   --   --  0.460  --   --   --   HGBA1C  --   --   --   --  5.8*  --   --   --     ------------------------------------------------------------------------------------------------------------------ No results for input(s): CHOL, HDL, LDLCALC, TRIG, CHOLHDL, LDLDIRECT in the last 72 hours.  Lab Results  Component Value Date   HGBA1C 5.8 (H) 05/30/2021    Recent Labs    05/30/21 1001  TSH 0.460   ------------------------------------------------------------------------------------------------------------------ ID Labs Recent Labs  Lab 05/27/21 1200 05/28/21 1611 05/29/21 1345 05/30/21 0315 05/30/21 0618 06/01/21 0233 06/01/21 0829  WBC 2.1* 2.5*  --   --  2.4* 9.2  --   PLT 82* 79*  --   --  PLATELET CLUMPS NOTED ON SMEAR, UNABLE TO ESTIMATE 83* 92*  CRP  --   --  0.5 0.6  --   --   --   DDIMER  --   --  0.53* 0.50  --   --  0.40  PROCALCITON  --   --  <0.10  --   --   --   --   CREATININE 1.21* 1.01*  --  1.19*  --  1.65*  --    Cardiac Enzymes No results for input(s): CKMB, TROPONINI, MYOGLOBIN in the last 168 hours.  Invalid input(s): CK   Radiology Reports DG Chest 2 View  Result Date: 05/29/2021 CLINICAL DATA:  COVID. Hx of DM, GERD, HTN, TB. Pt is a nonsmoker. EXAM: CHEST - 2 VIEW COMPARISON:  Chest x-ray 03/16/2019, CT chest 03/16/2019 FINDINGS: The heart and mediastinal contours are unchanged. Aortic calcification. Low lung volumes. Hazy airspace opacity within the left lower lung zone likely due to artifact from overlying soft tissues. No pulmonary edema. No pleural effusion. No pneumothorax. No acute osseous abnormality.  IMPRESSION: Low lung volumes.  No active cardiopulmonary disease. Electronically Signed   By: Iven Finn M.D.   On: 05/29/2021 15:43   CT Head Wo Contrast  Result Date: 05/28/2021 CLINICAL DATA:  Headache.  Neuro deficit. EXAM: CT HEAD WITHOUT CONTRAST TECHNIQUE: Contiguous axial images were obtained from the base of the skull through the vertex without intravenous  contrast. COMPARISON:  CT head 05/11/2016 FINDINGS: Brain: No evidence of acute infarction, hemorrhage, hydrocephalus, extra-axial collection or mass lesion/mass effect. Vascular: Negative for hyperdense vessel Skull: Negative Sinuses/Orbits: Moderate mucosal edema throughout the paranasal sinuses. Bilateral cataract extraction Other: None IMPRESSION: Negative CT head Moderate sinus mucosal edema. Electronically Signed   By: Franchot Gallo M.D.   On: 05/28/2021 17:04   CT ABDOMEN PELVIS W CONTRAST  Result Date: 05/29/2021 CLINICAL DATA:  Epigastric pain. EXAM: CT ABDOMEN AND PELVIS WITH CONTRAST TECHNIQUE: Multidetector CT imaging of the abdomen and pelvis was performed using the standard protocol following bolus administration of intravenous contrast. CONTRAST:  17mL OMNIPAQUE IOHEXOL 300 MG/ML  SOLN COMPARISON:  03/16/2019. FINDINGS: Lower chest: The heart is enlarged and there is a trace pericardial effusion. There is atelectasis at the left lung base. Patchy airspace disease with associated scarring noted at the right lung base. Hepatobiliary: A few subcentimeter hypodensities are present in the liver which are unchanged from the prior exam. No biliary ductal dilatation. The gallbladder is surgically absent. Pancreas: Unremarkable. No pancreatic ductal dilatation or surrounding inflammatory changes. Spleen: Normal in size without focal abnormality. Adrenals/Urinary Tract: Adrenal glands are unremarkable. Kidneys are normal, without renal calculi, focal lesion, or hydronephrosis. Bladder is unremarkable. Stomach/Bowel: There is a small  hiatal hernia. No bowel obstruction, free air or pneumatosis. A normal appendix is seen in the right lower quadrant. No focal bowel wall thickening. Vascular/Lymphatic: Aortic atherosclerosis. No enlarged abdominal or pelvic lymph nodes. Reproductive: The uterus is in-situ. A small cystic structure is seen in the right adnexa measuring 1.7 cm. There is a small cystic structure in the left adnexa measuring 1.5 cm. Other: No free fluid. Musculoskeletal: Degenerative changes are present in the thoracolumbar spine. There are compression deformities with vertebral plana at T12 and L1, unchanged from 2020. IMPRESSION: 1. No acute intra-abdominal process. 2. Small sliding hiatal hernia. 3. Mild consolidation adjacent to scarring in the right lower lobe, possible atelectasis or infiltrate. 4. Cardiomegaly with a trace pericardial effusion. 5. Cystic structures in the adnexal regions bilaterally measuring up to 1.7 cm. Ultrasound is suggested for further characterization on follow-up. Electronically Signed   By: Brett Fairy M.D.   On: 05/29/2021 04:48

## 2021-06-02 ENCOUNTER — Other Ambulatory Visit (HOSPITAL_COMMUNITY): Payer: Self-pay

## 2021-06-02 ENCOUNTER — Inpatient Hospital Stay (HOSPITAL_COMMUNITY): Payer: Medicare Other

## 2021-06-02 DIAGNOSIS — E860 Dehydration: Secondary | ICD-10-CM | POA: Diagnosis not present

## 2021-06-02 DIAGNOSIS — E871 Hypo-osmolality and hyponatremia: Secondary | ICD-10-CM | POA: Diagnosis not present

## 2021-06-02 DIAGNOSIS — U071 COVID-19: Secondary | ICD-10-CM | POA: Diagnosis not present

## 2021-06-02 LAB — CBC WITH DIFFERENTIAL/PLATELET
Abs Immature Granulocytes: 0.07 10*3/uL (ref 0.00–0.07)
Basophils Absolute: 0 10*3/uL (ref 0.0–0.1)
Basophils Relative: 0 %
Eosinophils Absolute: 0.1 10*3/uL (ref 0.0–0.5)
Eosinophils Relative: 2 %
HCT: 32.2 % — ABNORMAL LOW (ref 36.0–46.0)
Hemoglobin: 11.1 g/dL — ABNORMAL LOW (ref 12.0–15.0)
Immature Granulocytes: 1 %
Lymphocytes Relative: 14 %
Lymphs Abs: 1 10*3/uL (ref 0.7–4.0)
MCH: 24.3 pg — ABNORMAL LOW (ref 26.0–34.0)
MCHC: 34.5 g/dL (ref 30.0–36.0)
MCV: 70.6 fL — ABNORMAL LOW (ref 80.0–100.0)
Monocytes Absolute: 0.6 10*3/uL (ref 0.1–1.0)
Monocytes Relative: 8 %
Neutro Abs: 5.5 10*3/uL (ref 1.7–7.7)
Neutrophils Relative %: 75 %
Platelets: 80 10*3/uL — ABNORMAL LOW (ref 150–400)
RBC: 4.56 MIL/uL (ref 3.87–5.11)
RDW: 24.3 % — ABNORMAL HIGH (ref 11.5–15.5)
Smear Review: NORMAL
WBC: 7.3 10*3/uL (ref 4.0–10.5)
nRBC: 0 % (ref 0.0–0.2)

## 2021-06-02 LAB — URINALYSIS, ROUTINE W REFLEX MICROSCOPIC
Bilirubin Urine: NEGATIVE
Glucose, UA: NEGATIVE mg/dL
Hgb urine dipstick: NEGATIVE
Ketones, ur: NEGATIVE mg/dL
Leukocytes,Ua: NEGATIVE
Nitrite: NEGATIVE
Protein, ur: 100 mg/dL — AB
Specific Gravity, Urine: 1.025 (ref 1.005–1.030)
pH: 5.5 (ref 5.0–8.0)

## 2021-06-02 LAB — UREA NITROGEN, URINE: Urea Nitrogen, Ur: 873 mg/dL

## 2021-06-02 LAB — GLUCOSE, CAPILLARY
Glucose-Capillary: 40 mg/dL — CL (ref 70–99)
Glucose-Capillary: 237 mg/dL — ABNORMAL HIGH (ref 70–99)
Glucose-Capillary: 200 mg/dL — ABNORMAL HIGH (ref 70–99)
Glucose-Capillary: 258 mg/dL — ABNORMAL HIGH (ref 70–99)

## 2021-06-02 LAB — URINALYSIS, MICROSCOPIC (REFLEX): Squamous Epithelial / HPF: NONE SEEN (ref 0–5)

## 2021-06-02 LAB — COMPREHENSIVE METABOLIC PANEL
ALT: 38 U/L (ref 0–44)
AST: 21 U/L (ref 15–41)
Albumin: 3 g/dL — ABNORMAL LOW (ref 3.5–5.0)
Alkaline Phosphatase: 48 U/L (ref 38–126)
Anion gap: 7 (ref 5–15)
BUN: 32 mg/dL — ABNORMAL HIGH (ref 8–23)
CO2: 26 mmol/L (ref 22–32)
Calcium: 8.8 mg/dL — ABNORMAL LOW (ref 8.9–10.3)
Chloride: 100 mmol/L (ref 98–111)
Creatinine, Ser: 0.95 mg/dL (ref 0.44–1.00)
GFR, Estimated: >60 mL/min (ref >60)
Glucose, Bld: 28 mg/dL — CL (ref 70–99)
Potassium: 3.6 mmol/L (ref 3.5–5.1)
Sodium: 133 mmol/L — ABNORMAL LOW (ref 135–145)
Total Bilirubin: 0.7 mg/dL (ref 0.3–1.2)
Total Protein: 6.5 g/dL (ref 6.5–8.1)

## 2021-06-02 LAB — MAGNESIUM: Magnesium: 1.7 mg/dL (ref 1.7–2.4)

## 2021-06-02 LAB — BRAIN NATRIURETIC PEPTIDE: B Natriuretic Peptide: 27.4 pg/mL (ref 0.0–100.0)

## 2021-06-02 LAB — C-REACTIVE PROTEIN: CRP: 1 mg/dL — ABNORMAL HIGH (ref <1.0)

## 2021-06-02 MED ORDER — LANTUS SOLOSTAR 100 UNIT/ML ~~LOC~~ SOPN: 25.0000 [IU] | PEN_INJECTOR | Freq: Every day | SUBCUTANEOUS | Status: DC

## 2021-06-02 MED ORDER — INSULIN GLARGINE-YFGN 100 UNIT/ML ~~LOC~~ SOLN
15.0000 [IU] | Freq: Every day | SUBCUTANEOUS | Status: DC
  Filled 2021-06-02: qty 0.15

## 2021-06-02 MED ORDER — DEXTROSE 50 % IV SOLN
INTRAVENOUS | Status: AC
  Filled 2021-06-02: qty 50

## 2021-06-02 MED ORDER — NIRMATRELVIR/RITONAVIR (PAXLOVID) TABLET (RENAL DOSING)
2.0000 | ORAL_TABLET | Freq: Two times a day (BID) | ORAL | 0 refills | Status: AC
  Filled 2021-06-02: qty 8, 2d supply, fill #0

## 2021-06-02 MED ORDER — DEXTROSE 50 % IV SOLN
1.0000 | Freq: Once | INTRAVENOUS | Status: AC
  Administered 2021-06-02: 50 mL via INTRAVENOUS

## 2021-06-02 MED ORDER — DEXTROSE-NACL 5-0.9 % IV SOLN: INTRAVENOUS | Status: DC

## 2021-06-02 NOTE — Discharge Instructions (Signed)
Follow with Primary MD Marcine Matar, MD in 7 days   Get CBC, CMP, 2 view Chest X ray -  checked next visit within 1 week by Primary MD    Activity: As tolerated with Full fall precautions use walker/cane & assistance as needed  Disposition Home    Diet: Heart Healthy Low Carb  Accuchecks 4 times/day, Once in AM empty stomach and then before each meal. Log in all results and show them to your Prim.MD in 3 days. If any glucose reading is under 80 or above 300 call your Prim MD immidiately. Follow Low glucose instructions for glucose under 80 as instructed.  Special Instructions: If you have smoked or chewed Tobacco  in the last 2 yrs please stop smoking, stop any regular Alcohol  and or any Recreational drug use.  On your next visit with your primary care physician please Get Medicines reviewed and adjusted.  Please request your Prim.MD to go over all Hospital Tests and Procedure/Radiological results at the follow up, please get all Hospital records sent to your Prim MD by signing hospital release before you go home.  If you experience worsening of your admission symptoms, develop shortness of breath, life threatening emergency, suicidal or homicidal thoughts you must seek medical attention immediately by calling 911 or calling your MD immediately  if symptoms less severe.  You Must read complete instructions/literature along with all the possible adverse reactions/side effects for all the Medicines you take and that have been prescribed to you. Take any new Medicines after you have completely understood and accpet all the possible adverse reactions/side effects.

## 2021-06-02 NOTE — Plan of Care (Signed)

## 2021-06-02 NOTE — Discharge Summary (Signed)
Olivia Williamson [id number] DOB: 14-Aug-1945 DOA: 05/28/2021  PCP: Olivia Pier, MD  Admit date: 05/28/2021  Discharge date: 06/02/2021  Admitted From: Home   Disposition:  Home   Recommendations for Outpatient Follow-up:   Follow up with PCP in 1-2 weeks  PCP Please obtain BMP/CBC, 2 view CXR in 1week,  (see Discharge instructions)   PCP Please follow up on the following pending results: Please arrange for outpatient pelvic ultrasound and OB follow-up for CT findings, continue outpatient hematology follow-up.   Home Williamson: None   Equipment/Devices: None  Consultations: None  Discharge Condition: Stable    CODE STATUS: Full    Diet Recommendation: Heart Healthy   Diet Order             Diet Carb Modified Fluid consistency: Thin; Room service appropriate? Yes  Diet effective now                    Chief Complaint  Patient presents with   Altered Mental Status     Brief history of present illness from the day of admission and additional interim summary    Olivia Williamson is a 76 y.o. female with medical history significant of DM; HTN; and tuberculosis presenting with headache and abdominal pain.  The patient's son reports that she has periods of "sickness" that have happened for the last 4-5 years.  She goes to bed for several days at a time.  Over the last 1-2 days she has complained of headache, n/v and chills without fever.   He thought it was related to her usual sickness.  She came to the ER yesterday for abdominal pain, headache, and nausea; she developed n/v and cough with post-tussive emesis while in the ER, in the ER her work-up was suggestive of COVID-19 infection likely incidental, abdominal pain with nausea vomiting, severe hyponatremia due to dehydration and she was admitted.                                                                  Hospital Course    Dehydration with hyponatremia-an AKI.  Resolved after IV fluids, encouraged to eat better but she does not like hospital food.  Family counseled to take monitor her oral intake.   2.  Abdominal pain with nausea vomiting transient in the ER.  CT scan abdomen pelvis and abdominal exam are unremarkable, she is now symptom-free continue to monitor with supportive care and advance diet.  Kindly review #8 below.   3.  Chronic microcytic anemia.  Nonspecific anemia panel, received 2 units of packed RBC transfusion on 05/31/2021 up on hematology recommendation over the phone by Dr. Annamaria Williamson, anemia much improved no signs of ongoing bleeding continue outpatient follow-up with hematology.   4.  Pancytopenia.  This is somewhat of  a chronic problem ongoing for several months, peripheral smear lab tech comments noted, pathologist review is still pending, teardrop cells found, discussed with heme-onc over the phone Dr. Annamaria Williamson 05/31/2021, transfused 2 units on 05/31/2021 with outpatient follow-up with Dr. Irene Williamson question if this is myelofibrosis.   6.  Incidental COVID-19 infection.  Supportive care.  She is already been started on Paxlovid.  Finish the course.   7.  Hypertension.  Continue home regimen and follow with PCP.   8. Nonspecific pelvic findings on CT abdomen pelvis.  PCP to arrange for outpatient pelvic ultrasound and OB follow-up.  9 .DM type II.  She is not eating hospital food well and has become hypoglycemic couple of nights, have reduced home dose Lantus encouraged to eat well and check CBGs q. Olivia Williamson S PCP to monitor and adjust further.  Discharge diagnosis     Principal Problem:   COVID-19 Active Problems:   Essential hypertension   Diabetes mellitus with neurological manifestations (Olivia Williamson)   Pancytopenia (HCC)   Hyponatremia   Stage 3a chronic kidney disease (CKD) (Olivia Williamson)    Discharge instructions    Discharge Instructions     Discharge  instructions   Complete by: As directed    Follow with Primary MD Olivia Pier, MD in 7 days   Get CBC, CMP, 2 view Chest X ray -  checked next visit within 1 week by Primary MD    Activity: As tolerated with Full fall precautions use walker/cane & assistance as needed  Disposition Home    Diet: Heart Healthy Low Carb  Accuchecks 4 times/day, Once in AM empty stomach and then before each meal. Log in all results and show them to your Prim.MD in 3 days. If any glucose reading is under 80 or above 300 call your Prim MD immidiately. Follow Low glucose instructions for glucose under 80 as instructed.  Special Instructions: If you have smoked or chewed Tobacco  in the last 2 yrs please stop smoking, stop any regular Alcohol  and or any Recreational drug use.  On your next visit with your primary care physician please Get Medicines reviewed and adjusted.  Please request your Prim.MD to go over all Hospital Tests and Procedure/Radiological results at the follow up, please get all Hospital records sent to your Prim MD by signing hospital release before you go home.  If you experience worsening of your admission symptoms, develop shortness of breath, life threatening emergency, suicidal or homicidal thoughts you must seek medical attention immediately by calling 911 or calling your MD immediately  if symptoms less severe.  You Must read complete instructions/literature along with all the possible adverse reactions/side effects for all the Medicines you take and that have been prescribed to you. Take any new Medicines after you have completely understood and accpet all the possible adverse reactions/side effects.   Increase activity slowly   Complete by: As directed        Discharge Medications   Allergies as of 06/02/2021       Reactions   Shrimp [shellfish Allergy]    Sneezing and SOB   Beef-derived Products Other (See Comments)   Generalized burning sensation   Fruit & Vegetable  Daily [nutritional Supplements] Other (See Comments)   Orange causes lower extremity burning   Metformin And Related Diarrhea   Norvasc [amlodipine]    Le EDEMA        Medication List     STOP taking these medications    hydrochlorothiazide 12.5 MG  tablet Commonly known as: HYDRODIURIL       TAKE these medications    B-D UF III MINI PEN NEEDLES 31G X 5 MM Misc Generic drug: Insulin Pen Needle USE TO INJECT LANTUS ONCE A DAY AND NOVOLOG TWICE DAILY. (TOTAL OF 3 INJECTIONS DAILY)   [facility] Calcium 600 & Vitamin D3 600-20 MG-MCG Tabs Generic drug: Calcium Carb-Cholecalciferol TAKE 1 TABLET (600 MG TOTAL) BY MOUTH 2 (TWO) TIMES DAILY WITH A MEAL. What changed: See the new instructions.   Lantus SoloStar 100 UNIT/ML Solostar Pen Generic drug: insulin glargine Inject 25 Units into the skin at bedtime. What changed: how much to take   losartan 25 MG tablet Commonly known as: COZAAR Take 1 tablet (25 mg total) by mouth daily.   nirmatrelvir/ritonavir EUA (renal dosing) 10 x 150 MG & 10 x 100MG  Tabs Commonly known as: PAXLOVID Take 2 tablets by mouth 2 (two) times daily for 2 days.   NovoLOG FlexPen 100 UNIT/ML FlexPen Generic drug: insulin aspart 8 units with breakfast and dinner What changed:  how much to take when to take this additional instructions   Vitamin D3 10 MCG (400 UNIT) tablet TAKE 2 TABLETS BY MOUTH EVERY DAY         Follow-up Information     Olivia Pier, MD. Schedule an appointment as soon as possible for a visit in 1 week(s).   Specialty: Internal Medicine Why: Please follow-up with your hematologist Dr. Irene Williamson within a week Contact information: [place] [address] [phone]                 Major procedures and Radiology Reports - PLEASE review detailed and final reports thoroughly  -       DG Chest 2 View  Result Date: 05/29/2021 CLINICAL DATA:  COVID. Hx of DM, GERD, HTN, TB. Pt is a nonsmoker. EXAM:  CHEST - 2 VIEW COMPARISON:  Chest x-ray 03/16/2019, CT chest 03/16/2019 FINDINGS: The heart and mediastinal contours are unchanged. Aortic calcification. Low lung volumes. Hazy airspace opacity within the left lower lung zone likely due to artifact from overlying soft tissues. No pulmonary edema. No pleural effusion. No pneumothorax. No acute osseous abnormality. IMPRESSION: Low lung volumes.  No active cardiopulmonary disease. Electronically Signed   By: Iven Finn M.D.   On: 05/29/2021 15:43   CT Head Wo Contrast  Result Date: 05/28/2021 CLINICAL DATA:  Headache.  Neuro deficit. EXAM: CT HEAD WITHOUT CONTRAST TECHNIQUE: Contiguous axial images were obtained from the base of the skull through the vertex without intravenous contrast. COMPARISON:  CT head 05/11/2016 FINDINGS: Brain: No evidence of acute infarction, hemorrhage, hydrocephalus, extra-axial collection or mass lesion/mass effect. Vascular: Negative for hyperdense vessel Skull: Negative Sinuses/Orbits: Moderate mucosal edema throughout the paranasal sinuses. Bilateral cataract extraction Other: None IMPRESSION: Negative CT head Moderate sinus mucosal edema. Electronically Signed   By: Franchot Gallo M.D.   On: 05/28/2021 17:04   CT ABDOMEN PELVIS W CONTRAST  Result Date: 05/29/2021 CLINICAL DATA:  Epigastric pain. EXAM: CT ABDOMEN AND PELVIS WITH CONTRAST TECHNIQUE: Multidetector CT imaging of the abdomen and pelvis was performed using the standard protocol following bolus administration of intravenous contrast. CONTRAST:  28mL OMNIPAQUE IOHEXOL 300 MG/ML  SOLN COMPARISON:  03/16/2019. FINDINGS: Lower chest: The heart is enlarged and there is a trace pericardial effusion. There is atelectasis at the left lung base. Patchy airspace disease with associated scarring noted at the right lung base. Hepatobiliary: A few subcentimeter hypodensities are  present in the liver which are unchanged from the prior exam. No biliary ductal dilatation. The  gallbladder is surgically absent. Pancreas: Unremarkable. No pancreatic ductal dilatation or surrounding inflammatory changes. Spleen: Normal in size without focal abnormality. Adrenals/Urinary Tract: Adrenal glands are unremarkable. Kidneys are normal, without renal calculi, focal lesion, or hydronephrosis. Bladder is unremarkable. Stomach/Bowel: There is a small hiatal hernia. No bowel obstruction, free air or pneumatosis. A normal appendix is seen in the right lower quadrant. No focal bowel wall thickening. Vascular/Lymphatic: Aortic atherosclerosis. No enlarged abdominal or pelvic lymph nodes. Reproductive: The uterus is in-situ. A small cystic structure is seen in the right adnexa measuring 1.7 cm. There is a small cystic structure in the left adnexa measuring 1.5 cm. Other: No free fluid. Musculoskeletal: Degenerative changes are present in the thoracolumbar spine. There are compression deformities with vertebral plana at T12 and L1, unchanged from 2020. IMPRESSION: 1. No acute intra-abdominal process. 2. Small sliding hiatal hernia. 3. Mild consolidation adjacent to scarring in the right lower lobe, possible atelectasis or infiltrate. 4. Cardiomegaly with a trace pericardial effusion. 5. Cystic structures in the adnexal regions bilaterally measuring up to 1.7 cm. Ultrasound is suggested for further characterization on follow-up. Electronically Signed   By: Brett Fairy M.D.   On: 05/29/2021 04:48   DG Chest Port 1 View  Result Date: 06/02/2021 CLINICAL DATA:  Shortness of breath EXAM: PORTABLE CHEST 1 VIEW COMPARISON:  05/29/2021 FINDINGS: Transverse diameter of heart is increased. There are no signs of pulmonary edema. Small patchy infiltrate is seen in the right lower lung fields. Rest of the lung fields are unremarkable. There is no pleural effusion or pneumothorax. IMPRESSION: Cardiomegaly. Small patchy infiltrate is seen in the right lower lung fields suggesting atelectasis/pneumonia. Electronically  Signed   By: Elmer Picker M.D.   On: 06/02/2021 08:41    Today   Subjective    Miciah Larsson today has no headache,no chest abdominal pain,no new weakness tingling or numbness, feels much better wants to go home today.     Objective   Blood pressure (!) 137/57, pulse 87, temperature 98.1 F (36.7 C), temperature source Oral, resp. rate 17, height 4\' 11"  (1.499 m), weight 45.1 kg, last menstrual period 05/11/2016, SpO2 96 %.   Intake/Output Summary (Last 24 hours) at 06/02/2021 1137 Last data filed at 06/02/2021 0900 Gross per 24 hour  Intake 2004.84 ml  Output --  Net 2004.84 ml    Exam  Awake Alert, No new F.N deficits,    [place].AT,PERRAL Supple Neck,   Symmetrical Chest wall movement, Good air movement bilaterally, CTAB RRR,No Gallops,   +ve B.Sounds, Abd Soft, Non tender,  No Cyanosis, Clubbing or edema    Data Review   CBC w Diff:  Lab Results  Component Value Date   WBC 7.3 06/02/2021   HGB 11.1 (L) 06/02/2021   HGB 7.4 (L) 04/14/2021   HCT 32.2 (L) 06/02/2021   HCT 23.4 (L) 05/27/2021   PLT 80 (L) 06/02/2021   PLT 89 (LL) 04/14/2021   LYMPHOPCT 14 06/02/2021   MONOPCT 8 06/02/2021   EOSPCT 2 06/02/2021   BASOPCT 0 06/02/2021    CMP:  Lab Results  Component Value Date   NA 133 (L) 06/02/2021   NA 134 04/14/2021   K 3.6 06/02/2021   CL 100 06/02/2021   CO2 26 06/02/2021   BUN 32 (H) 06/02/2021   BUN 43 (H) 04/14/2021   CREATININE 0.95 06/02/2021   CREATININE 1.21 (H) 05/27/2021  CREATININE 0.66 06/19/2016   PROT 6.5 06/02/2021   PROT 7.4 10/29/2020   ALBUMIN 3.0 (L) 06/02/2021   ALBUMIN 4.2 10/29/2020   BILITOT 0.7 06/02/2021   BILITOT 0.5 05/27/2021   ALKPHOS 48 06/02/2021   AST 21 06/02/2021   AST 56 (H) 05/27/2021   ALT 38 06/02/2021   ALT 67 (H) 05/27/2021  . Lab Results  Component Value Date   HGBA1C 5.8 (H) 05/30/2021     Total Time in preparing paper work, data evaluation and todays exam - 58 minutes  Lala Lund M.D on  06/02/2021 at 11:37 AM  [facility]

## 2021-06-02 NOTE — Progress Notes (Signed)
Lab called with critical lab blood glucose 27. Went to pt room to double check with finger stick. Blood glucose was 40. Pt asymptomatic. Gave D50  25g/21ml. Notified provider Imogene Burn. Provider ordered D5 normal saline at 50. Will recheck glucose in 15 minutes per protocol .

## 2021-06-02 NOTE — Progress Notes (Signed)
°  Transition of Care [facility]) Screening Note   Patient Details  Name: Olivia Williamson Date of Birth: January 29, 1946   Transition of Care [facility]) CM/SW Contact:    Tom-Johnson, Hershal Coria, RN Phone Number: 06/02/2021, 3:44 PM  Patient is scheduled for discharge today. No followup by PT/OT. Transition of Care Department [facility]) has reviewed patient and no TOC needs have been identified at this time. Family to transport at discharge.

## 2021-06-02 NOTE — Progress Notes (Signed)
DISCHARGE NOTE HOME Olivia Williamson to be discharged Home per MD order. Discussed prescriptions and follow up appointments with the patient. Prescriptions given to patient; medication list explained in detail. Patient verbalized understanding.  Skin clean, dry and intact without evidence of skin break down, no evidence of skin tears noted. IV catheter discontinued intact. Site without signs and symptoms of complications. Dressing and pressure applied. Pt denies pain at the site currently. No complaints noted.  Patient free of lines, drains, and wounds.   An After Visit Summary (AVS) was printed and given to the patient. Patient escorted via wheelchair, and discharged home via private auto.  Vira Agar, RN

## 2021-06-02 NOTE — Progress Notes (Addendum)
Hypoglycemic Event  CBG: 40  Treatment: D50 50 mL (25 gm)  Symptoms: None  Follow-up CBG: Time: 0535 CBG Result: 237  Possible Reasons for Event: Unknown   Comments/MD notified: Dr. Bridgett Larsson notified  Q1 hour post check was 200.    Olivia Williamson

## 2021-06-02 NOTE — Progress Notes (Signed)
°  X-cover Note: RN reports 2.5 sec pause on telemetry. Coreg stopped. Pacer pads placed on patient.   Carollee Herter, DO [facility]

## 2021-06-02 NOTE — Progress Notes (Signed)
Tele tech called stating that pt had a 2.56 second  sinus pause on monitor at 0356. Pt asymptomatic, up talking to son. Provider Imogene Burn notified and instructed to put pacer pads on pt to be proactive. Paced pads placed. Also notified rapid response nurse and charge nurse Elnita Maxwell.Will continue to monitor , call bell within reach.

## 2021-06-03 ENCOUNTER — Telehealth: Payer: Self-pay

## 2021-06-03 ENCOUNTER — Ambulatory Visit: Payer: Medicare Other | Admitting: Hematology

## 2021-06-03 LAB — PATHOLOGIST SMEAR REVIEW

## 2021-06-03 NOTE — Telephone Encounter (Signed)
Transition Care Management Unsuccessful Follow-up Telephone Call  Date of discharge and from where:  06/02/2021, [facility]   Attempts:  1st Attempt - call placed to Language Resources, spoke to [place] and requested interpreter to assist with this TOC call.  She will have the interpreter call this CM when she is able to schedule an interpreter who can assist,

## 2021-06-04 ENCOUNTER — Telehealth: Payer: Self-pay | Admitting: Hematology

## 2021-06-04 ENCOUNTER — Telehealth: Payer: Self-pay

## 2021-06-04 NOTE — Telephone Encounter (Signed)
Transition Care Management Unsuccessful Follow-up Telephone Call  Call placed with assistance of [place] interpreter, [place]/[employer]  Date of discharge and from where:  06/02/2021  Attempts:  2nd Attempt  Reason for unsuccessful TCM follow-up call:  Left voice message on each of the following # [phone], [phone] and [phone]. Call back requested to this CM.  Need to discuss scheduling a hospital follow up appointment with PCP

## 2021-06-04 NOTE — Telephone Encounter (Signed)
Left message with follow-up appointment per 1/11 staff message. °

## 2021-06-05 ENCOUNTER — Telehealth: Payer: Self-pay

## 2021-06-05 NOTE — Telephone Encounter (Signed)
Transition Care Management Unsuccessful Follow-up Telephone Call  Calls placed with assistance of [place] interpreter, [place]  Date of discharge and from where:  06/02/2021, [facility]  Attempts:  2nd Attempt  Reason for unsuccessful TCM follow-up call:  Left voice message on # [phone] and [phone].  Call back requested.  Call placed to # [phone] and per Jamelle Haring, this is not a working number.  Need to discuss scheduling a hospital follow up appointment with PCP

## 2021-06-09 ENCOUNTER — Ambulatory Visit: Payer: Medicare Other | Attending: Nurse Practitioner | Admitting: Nurse Practitioner

## 2021-06-09 ENCOUNTER — Ambulatory Visit (HOSPITAL_COMMUNITY)
Admission: RE | Admit: 2021-06-09 | Discharge: 2021-06-09 | Disposition: A | Discharge status: Home / Self Care | Payer: Medicare Other | Source: Ambulatory Visit | Attending: Nurse Practitioner | Admitting: Nurse Practitioner

## 2021-06-09 ENCOUNTER — Encounter: Payer: Self-pay | Admitting: Nurse Practitioner

## 2021-06-09 ENCOUNTER — Other Ambulatory Visit: Payer: Self-pay

## 2021-06-09 VITALS — BP 195/78 | HR 73 | Ht 59.0 in | Wt 104.2 lb

## 2021-06-09 DIAGNOSIS — D696 Thrombocytopenia, unspecified: Secondary | ICD-10-CM

## 2021-06-09 DIAGNOSIS — N949 Unspecified condition associated with female genital organs and menstrual cycle: Secondary | ICD-10-CM

## 2021-06-09 DIAGNOSIS — Z09 Encounter for follow-up examination after completed treatment for conditions other than malignant neoplasm: Secondary | ICD-10-CM

## 2021-06-09 DIAGNOSIS — J189 Pneumonia, unspecified organism: Secondary | ICD-10-CM | POA: Diagnosis present

## 2021-06-09 DIAGNOSIS — I10 Essential (primary) hypertension: Secondary | ICD-10-CM | POA: Diagnosis not present

## 2021-06-09 MED ORDER — LOSARTAN POTASSIUM 50 MG PO TABS: 50.0000 mg | ORAL_TABLET | Freq: Every day | ORAL | 2 refills | Status: DC

## 2021-06-09 NOTE — Progress Notes (Signed)
Assessment & Plan:  Olivia Williamson was seen today for hospitalization follow-up.  Diagnoses and all orders for this visit:  Hospital discharge follow-up  Essential hypertension -     CMP14+EGFR -     losartan (COZAAR) 50 MG tablet; Take 1 tablet (50 mg total) by mouth daily.  Adnexal cyst -     Ambulatory referral to Gynecology  Anemia with low platelet count (HCC) -     CBC with Differential  Pneumonia of right lower lobe due to infectious organism -     DG Chest 2 View; Future    Patient has been counseled on age-appropriate routine health concerns for screening and prevention. These are reviewed and up-to-date. Referrals have been placed accordingly. Immunizations are up-to-date or declined.    Subjective:   Chief Complaint  Patient presents with   Hospitalization Follow-up   HPI Olivia Williamson 76 y.o. female presents to office today for hospital follow up. She has a past medical history of Arthritis, DM2, GERD,  Hypertension, and Tuberculosis.  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 She was instructed to follow up with GYN for nonspecific pelvic findings of adnexal cysts on CT abdomen/pelvis (GYN referral placed today). Will order repeat CXR today.   Unfortunately she has continued to take HCTZ since discharge despite hyponatremia. Her losartan was increased today and her daughter was instructed to stop HCTZ until she follows up with PCP. Blood pressure is poorly controlled today with losartan 25 mg daily. She denies chest pain, shortness of breath, headache or palpitations.  Also her daughter was unaware that patient had COVID and is not sure if she completed Paxlovid. Patient is unable  to recall as well. She was supposed to complete the last 2 days upon discharge. I also instructed patient and her daughter regarding pelvic US results and need for referral.  BP Readings from Last 3 Encounters:  06/09/21 (!) 195/78  06/02/21 (!) 137/57  05/28/21 121/74    Diabetes is well controlled. She is currently taking Lantus 25 units at bedtime and Novolog 8 units BID.  Lab Results  Component Value Date   HGBA1C 5.8 (H) 05/30/2021    Review of Systems  Constitutional:  Negative for fever, malaise/fatigue and weight loss.  HENT: Negative.  Negative for nosebleeds.   Eyes: Negative.  Negative for blurred vision, double vision and photophobia.  Respiratory: Negative.  Negative for cough and shortness of breath.   Cardiovascular: Negative.  Negative for chest pain, palpitations and leg swelling.  Gastrointestinal: Negative.  Negative for heartburn, nausea and vomiting.  Musculoskeletal: Negative.  Negative for myalgias.  Neurological: Negative.  Negative for dizziness, focal weakness, seizures and headaches.  Psychiatric/Behavioral: Negative.  Negative for suicidal ideas.    Past Medical History:  Diagnosis Date   Arthritis    Diabetes mellitus without complication ([place])    stopped DM meds 2004   GERD (gastroesophageal reflux disease)    Hypertension    meds stopped 1 week ago , need to get renewed   Tuberculosis    ACTIVE    Past Surgical History:  Procedure Laterality Date   ABDOMINAL HYSTERECTOMY     CHOLECYSTECTOMY N/A 05/02/2014   Procedure: LAPAROSCOPIC CHOLECYSTECTOMY ;  Surgeon: Coralie Keens, MD;  Location: [facility];  Service: General;  Laterality: N/A;  laparoscopic cholecystectomy   COLONOSCOPY     VIDEO BRONCHOSCOPY Bilateral 01/10/2016   Procedure: VIDEO BRONCHOSCOPY WITHOUT FLUORO;  Surgeon: Collene Gobble, MD;  Location: [facility];  Service: Cardiopulmonary;  Laterality: Bilateral;    Family History  Problem Relation Age of Onset   Heart disease  Maternal Uncle 46    Social History Reviewed with no changes to be made today.   Outpatient Medications Prior to Visit  Medication Sig Dispense Refill   B-D UF III MINI PEN NEEDLES 31G X 5 MM MISC USE TO INJECT LANTUS ONCE A DAY AND NOVOLOG TWICE DAILY. (TOTAL OF 3 INJECTIONS DAILY) 100 each 2   Cholecalciferol (VITAMIN D3) 10 MCG (400 UNIT) tablet TAKE 2 TABLETS BY MOUTH EVERY DAY (Patient taking differently: Take 800 Units by mouth daily.) 100 tablet 8   [employer] CALCIUM 600 & VITAMIN D3 600-20 MG-MCG TABS TAKE 1 TABLET (600 MG TOTAL) BY MOUTH 2 (TWO) TIMES DAILY WITH A MEAL. (Patient taking differently: Take 1 tablet by mouth in the morning and at bedtime.) 180 tablet 1   insulin aspart (NOVOLOG FLEXPEN) 100 UNIT/ML FlexPen 8 units with breakfast and dinner (Patient taking differently: 8 Units 2 (two) times daily with a meal.) 15 mL 11   insulin glargine (LANTUS SOLOSTAR) 100 UNIT/ML Solostar Pen Inject 25 Units into the skin at bedtime.     losartan (COZAAR) 25 MG tablet Take 1 tablet (25 mg total) by mouth daily. 90 tablet 3   No facility-administered medications prior to visit.    Allergies  Allergen Reactions   Shrimp [Shellfish Allergy]     Sneezing and SOB   Beef-Derived Products Other (See Comments)    Generalized burning sensation   Fruit & Vegetable Daily [Nutritional Supplements] Other (See Comments)    Orange causes lower extremity burning   Metformin And Related Diarrhea   Norvasc [Amlodipine]     Le EDEMA       Objective:    BP (!) 195/78    Pulse 73    Ht _0  (1.499 m)    Wt 104 lb 4 oz (47.3 kg)    LMP 05/11/2016    SpO2 100%    BMI 21.06 kg/m  Wt Readings from Last 3 Encounters:  06/09/21 104 lb 4 oz (47.3 kg)  06/01/21 99 lb 6.8 oz (45.1 kg)  05/27/21 102 lb 11.2 oz (46.6 kg)    Physical Exam Vitals and nursing note reviewed.  Constitutional:      Appearance: She is well-developed.  HENT:     Head: Normocephalic and atraumatic.  Cardiovascular:      Rate and Rhythm: Normal rate and regular rhythm.     Heart sounds: Normal heart sounds. No murmur heard.   No friction rub. No gallop.  Pulmonary:     Effort: Pulmonary effort is normal. No tachypnea or respiratory distress.     Breath sounds: Normal breath sounds. No decreased breath sounds, wheezing, rhonchi or rales.  Chest:     Chest wall: No tenderness.  Abdominal:     General: Bowel sounds are normal.     Palpations: Abdomen is soft.  Musculoskeletal:        General: Normal range of motion.     Cervical back: Normal range of motion.  Skin:    General: Skin is warm and dry.  Neurological:     Mental Status: She is alert and oriented to person, place, and time.  Coordination: Coordination normal.  Psychiatric:        Behavior: Behavior normal. Behavior is cooperative.        Thought Content: Thought content normal.        Judgment: Judgment normal.         Patient has been counseled extensively about nutrition and exercise as well as the importance of adherence with medications and regular follow-up. The patient was given clear instructions to go to ER or return to medical center if symptoms don't improve, worsen or new problems develop. The patient verbalized understanding.   Follow-up: Return for nurse visit BP check next Friday .   Gildardo Pounds, FNP-BC [facility] [place], [place]   06/09/2021, 8:53 PM

## 2021-06-10 LAB — CBC WITH DIFFERENTIAL/PLATELET
Basophils Absolute: 0 10*3/uL (ref 0.0–0.2)
Basos: 1 %
EOS (ABSOLUTE): 0.2 10*3/uL (ref 0.0–0.4)
Eos: 3 %
Hematocrit: 29.1 % — ABNORMAL LOW (ref 34.0–46.6)
Hemoglobin: 9.5 g/dL — ABNORMAL LOW (ref 11.1–15.9)
Immature Grans (Abs): 0 10*3/uL (ref 0.0–0.1)
Immature Granulocytes: 0 %
Lymphocytes Absolute: 0.8 10*3/uL (ref 0.7–3.1)
Lymphs: 16 %
MCH: 23.8 pg — ABNORMAL LOW (ref 26.6–33.0)
MCHC: 32.6 g/dL (ref 31.5–35.7)
MCV: 73 fL — ABNORMAL LOW (ref 79–97)
Monocytes Absolute: 0.5 10*3/uL (ref 0.1–0.9)
Monocytes: 9 %
Neutrophils Absolute: 3.4 10*3/uL (ref 1.4–7.0)
Neutrophils: 71 %
Platelets: 82 10*3/uL — CL (ref 150–450)
RBC: 3.99 x10E6/uL (ref 3.77–5.28)
RDW: 24.6 % — ABNORMAL HIGH (ref 11.7–15.4)
WBC: 4.9 10*3/uL (ref 3.4–10.8)

## 2021-06-10 LAB — CMP14+EGFR
ALT: 35 IU/L — ABNORMAL HIGH (ref 0–32)
AST: 21 IU/L (ref 0–40)
Albumin/Globulin Ratio: 1.2 (ref 1.2–2.2)
Albumin: 3.8 g/dL (ref 3.7–4.7)
Alkaline Phosphatase: 87 IU/L (ref 44–121)
BUN/Creatinine Ratio: 38 — ABNORMAL HIGH (ref 12–28)
BUN: 38 mg/dL — ABNORMAL HIGH (ref 8–27)
Bilirubin Total: 0.4 mg/dL (ref 0.0–1.2)
CO2: 24 mmol/L (ref 20–29)
Calcium: 9.5 mg/dL (ref 8.7–10.3)
Chloride: 92 mmol/L — ABNORMAL LOW (ref 96–106)
Creatinine, Ser: 0.99 mg/dL (ref 0.57–1.00)
Globulin, Total: 3.1 g/dL (ref 1.5–4.5)
Glucose: 145 mg/dL — ABNORMAL HIGH (ref 70–99)
Potassium: 5 mmol/L (ref 3.5–5.2)
Sodium: 129 mmol/L — ABNORMAL LOW (ref 134–144)
Total Protein: 6.9 g/dL (ref 6.0–8.5)
eGFR: 60 mL/min/{1.73_m2} (ref >59)

## 2021-06-13 ENCOUNTER — Ambulatory Visit: Payer: Medicare Other

## 2021-06-13 ENCOUNTER — Other Ambulatory Visit: Payer: Self-pay

## 2021-06-13 DIAGNOSIS — D582 Other hemoglobinopathies: Secondary | ICD-10-CM

## 2021-06-16 ENCOUNTER — Observation Stay (HOSPITAL_COMMUNITY)
Admission: EM | Admit: 2021-06-16 | Discharge: 2021-06-17 | Disposition: A | Discharge status: Home / Self Care | Payer: Medicare Other | Attending: Family Medicine | Admitting: Family Medicine

## 2021-06-16 ENCOUNTER — Ambulatory Visit: Payer: Medicare Other | Attending: Internal Medicine

## 2021-06-16 ENCOUNTER — Other Ambulatory Visit: Payer: Self-pay

## 2021-06-16 ENCOUNTER — Ambulatory Visit: Payer: Medicare Other

## 2021-06-16 ENCOUNTER — Encounter (HOSPITAL_COMMUNITY): Payer: Self-pay

## 2021-06-16 ENCOUNTER — Inpatient Hospital Stay: Payer: Medicare Other

## 2021-06-16 ENCOUNTER — Inpatient Hospital Stay (HOSPITAL_BASED_OUTPATIENT_CLINIC_OR_DEPARTMENT_OTHER): Payer: Medicare Other | Admitting: Hematology

## 2021-06-16 VITALS — BP 175/83 | HR 81 | Temp 99.0°F | Resp 18 | Wt 103.6 lb

## 2021-06-16 VITALS — BP 144/74

## 2021-06-16 DIAGNOSIS — E871 Hypo-osmolality and hyponatremia: Secondary | ICD-10-CM | POA: Diagnosis present

## 2021-06-16 DIAGNOSIS — D649 Anemia, unspecified: Secondary | ICD-10-CM

## 2021-06-16 DIAGNOSIS — Z888 Allergy status to other drugs, medicaments and biological substances status: Secondary | ICD-10-CM | POA: Diagnosis not present

## 2021-06-16 DIAGNOSIS — Z8249 Family history of ischemic heart disease and other diseases of the circulatory system: Secondary | ICD-10-CM | POA: Insufficient documentation

## 2021-06-16 DIAGNOSIS — I10 Essential (primary) hypertension: Secondary | ICD-10-CM

## 2021-06-16 DIAGNOSIS — Z794 Long term (current) use of insulin: Secondary | ICD-10-CM | POA: Diagnosis not present

## 2021-06-16 DIAGNOSIS — D565 Hemoglobin E-beta thalassemia: Secondary | ICD-10-CM | POA: Insufficient documentation

## 2021-06-16 DIAGNOSIS — Z9049 Acquired absence of other specified parts of digestive tract: Secondary | ICD-10-CM | POA: Insufficient documentation

## 2021-06-16 DIAGNOSIS — D582 Other hemoglobinopathies: Secondary | ICD-10-CM | POA: Diagnosis not present

## 2021-06-16 DIAGNOSIS — E119 Type 2 diabetes mellitus without complications: Secondary | ICD-10-CM | POA: Insufficient documentation

## 2021-06-16 DIAGNOSIS — Z79899 Other long term (current) drug therapy: Secondary | ICD-10-CM | POA: Diagnosis not present

## 2021-06-16 DIAGNOSIS — Z20822 Contact with and (suspected) exposure to covid-19: Secondary | ICD-10-CM | POA: Insufficient documentation

## 2021-06-16 LAB — RENAL FUNCTION PANEL
Albumin: 3.3 g/dL — ABNORMAL LOW (ref 3.5–5.0)
Anion gap: 6 (ref 5–15)
BUN: 34 mg/dL — ABNORMAL HIGH (ref 8–23)
CO2: 25 mmol/L (ref 22–32)
Calcium: 9.3 mg/dL (ref 8.9–10.3)
Chloride: 96 mmol/L — ABNORMAL LOW (ref 98–111)
Creatinine, Ser: 0.81 mg/dL (ref 0.44–1.00)
GFR, Estimated: >60 mL/min (ref >60)
Glucose, Bld: 113 mg/dL — ABNORMAL HIGH (ref 70–99)
Phosphorus: 3.4 mg/dL (ref 2.5–4.6)
Potassium: 4.6 mmol/L (ref 3.5–5.1)
Sodium: 127 mmol/L — ABNORMAL LOW (ref 135–145)

## 2021-06-16 LAB — CBC WITH DIFFERENTIAL (CANCER CENTER ONLY)
Abs Immature Granulocytes: 0.02 10*3/uL (ref 0.00–0.07)
Basophils Absolute: 0 10*3/uL (ref 0.0–0.1)
Basophils Relative: 0 %
Eosinophils Absolute: 0.1 10*3/uL (ref 0.0–0.5)
Eosinophils Relative: 3 %
HCT: 23.3 % — ABNORMAL LOW (ref 36.0–46.0)
Hemoglobin: 8.5 g/dL — ABNORMAL LOW (ref 12.0–15.0)
Immature Granulocytes: 0 %
Lymphocytes Relative: 14 %
Lymphs Abs: 0.7 10*3/uL (ref 0.7–4.0)
MCH: 24.7 pg — ABNORMAL LOW (ref 26.0–34.0)
MCHC: 36.5 g/dL — ABNORMAL HIGH (ref 30.0–36.0)
MCV: 67.7 fL — ABNORMAL LOW (ref 80.0–100.0)
Monocytes Absolute: 0.2 10*3/uL (ref 0.1–1.0)
Monocytes Relative: 5 %
Neutro Abs: 3.6 10*3/uL (ref 1.7–7.7)
Neutrophils Relative %: 78 %
Platelet Count: 81 10*3/uL — ABNORMAL LOW (ref 150–400)
RBC: 3.44 MIL/uL — ABNORMAL LOW (ref 3.87–5.11)
RDW: 22.3 % — ABNORMAL HIGH (ref 11.5–15.5)
WBC Count: 4.6 10*3/uL (ref 4.0–10.5)
nRBC: 0 % (ref 0.0–0.2)

## 2021-06-16 LAB — SAMPLE TO BLOOD BANK

## 2021-06-16 LAB — CMP (CANCER CENTER ONLY)
ALT: 33 U/L (ref 0–44)
AST: 25 U/L (ref 15–41)
Albumin: 3.7 g/dL (ref 3.5–5.0)
Alkaline Phosphatase: 86 U/L (ref 38–126)
Anion gap: 5 (ref 5–15)
BUN: 36 mg/dL — ABNORMAL HIGH (ref 8–23)
CO2: 27 mmol/L (ref 22–32)
Calcium: 9.2 mg/dL (ref 8.9–10.3)
Chloride: 90 mmol/L — ABNORMAL LOW (ref 98–111)
Creatinine: 1.1 mg/dL — ABNORMAL HIGH (ref 0.44–1.00)
GFR, Estimated: 53 mL/min — ABNORMAL LOW (ref >60)
Glucose, Bld: 214 mg/dL — ABNORMAL HIGH (ref 70–99)
Potassium: 4.8 mmol/L (ref 3.5–5.1)
Sodium: 122 mmol/L — ABNORMAL LOW (ref 135–145)
Total Bilirubin: 0.4 mg/dL (ref 0.3–1.2)
Total Protein: 7 g/dL (ref 6.5–8.1)

## 2021-06-16 LAB — OSMOLALITY, URINE: Osmolality, Ur: 223 mosm/kg — ABNORMAL LOW (ref 300–900)

## 2021-06-16 LAB — RESP PANEL BY RT-PCR (FLU A&B, COVID) ARPGX2
Influenza A by PCR: NEGATIVE
Influenza B by PCR: NEGATIVE
SARS Coronavirus 2 by RT PCR: NEGATIVE

## 2021-06-16 LAB — GLUCOSE, CAPILLARY: Glucose-Capillary: 286 mg/dL — ABNORMAL HIGH (ref 70–99)

## 2021-06-16 LAB — SODIUM, URINE, RANDOM: Sodium, Ur: 29 mmol/L

## 2021-06-16 MED ORDER — POLYSACCHARIDE IRON COMPLEX 150 MG PO CAPS: 150.0000 mg | ORAL_CAPSULE | Freq: Every day | ORAL | 1 refills | Status: DC

## 2021-06-16 MED ORDER — ACETAMINOPHEN 325 MG PO TABS: 650.0000 mg | ORAL_TABLET | Freq: Four times a day (QID) | ORAL | Status: DC | PRN

## 2021-06-16 MED ORDER — ACETAMINOPHEN 650 MG RE SUPP: 650.0000 mg | Freq: Four times a day (QID) | RECTAL | Status: DC | PRN

## 2021-06-16 MED ORDER — INSULIN ASPART 100 UNIT/ML IJ SOLN
0.0000 [IU] | Freq: Three times a day (TID) | INTRAMUSCULAR | Status: DC
  Administered 2021-06-17: 13:00:00 2 [IU] via SUBCUTANEOUS

## 2021-06-16 MED ORDER — SODIUM CHLORIDE 0.9 % IV SOLN: INTRAVENOUS | Status: DC

## 2021-06-16 MED ORDER — HYDRALAZINE HCL 20 MG/ML IJ SOLN: 10.0000 mg | Freq: Three times a day (TID) | INTRAMUSCULAR | Status: DC | PRN

## 2021-06-16 MED ORDER — B COMPLEX VITAMINS PO CAPS: 1.0000 | ORAL_CAPSULE | Freq: Every day | ORAL | 11 refills | Status: AC

## 2021-06-16 NOTE — ED Triage Notes (Signed)
[facility] They reportedly sent her over for low sodium levels. Sodium level was 122 today per paperwork sent with pt. Patient's daughter reports that pt is at baseline at this time.

## 2021-06-16 NOTE — ED Provider Notes (Signed)
[facility] DEPT Provider Note   CSN: [id number] Arrival date & time: 06/16/21  1425     History  Chief Complaint  Patient presents with   low sodium    Olivia Williamson is a 76 y.o. female.  76 yo F with a chief complaints of a low sodium.  The patient was just in the hospital with COVID-pneumonia and was found to be hyponatremic at that time.  She was treated had improvement and was discharged.  She followed up today with her oncologist.  Was found to have a downward trend of her sodium again.  Was found that she still taking her hydrochlorothiazide which I thought was the likely cause.  Per the family there is been no confusion no fatigue no seizures.  They feel that she is actually improved since last hospitalization.  Eating and drinking normally.  No diarrhea.  The history is provided by the patient.  Illness Severity:  Moderate Onset quality:  Gradual Duration:  2 days Timing:  Constant Progression:  Unchanged Chronicity:  New Associated symptoms: no abdominal pain, no chest pain, no congestion, no fever, no headaches, no myalgias, no nausea, no rhinorrhea, no shortness of breath, no vomiting and no wheezing       Home Medications Prior to Admission medications   Medication Sig Start Date End Date Taking? Authorizing Provider  b complex vitamins capsule Take 1 capsule by mouth daily. 06/16/21   Brunetta Genera, MD  B-D UF III MINI PEN NEEDLES 31G X 5 MM MISC USE TO INJECT LANTUS ONCE A DAY AND NOVOLOG TWICE DAILY. (TOTAL OF 3 INJECTIONS DAILY) 03/08/21   [name], MD  Cholecalciferol (VITAMIN D3) 10 MCG (400 UNIT) tablet TAKE 2 TABLETS BY MOUTH EVERY DAY Patient taking differently: Take 800 Units by mouth daily. 04/16/21   [name], MD  CVS CALCIUM 600 & VITAMIN D3 600-20 MG-MCG TABS TAKE 1 TABLET (600 MG TOTAL) BY MOUTH 2 (TWO) TIMES DAILY WITH A MEAL. Patient taking differently: Take 1 tablet by mouth in the morning and at  bedtime. 05/26/21   [name], MD  insulin aspart (NOVOLOG FLEXPEN) 100 UNIT/ML FlexPen 8 units with breakfast and dinner Patient taking differently: 8 Units 2 (two) times daily with a meal. 03/14/20   [name], MD  insulin glargine (LANTUS SOLOSTAR) 100 UNIT/ML Solostar Pen Inject 25 Units into the skin at bedtime. 06/02/21   Thurnell Lose, MD  iron polysaccharides (NIFEREX) 150 MG capsule Take 1 capsule (150 mg total) by mouth daily. 06/16/21   Brunetta Genera, MD  losartan (COZAAR) 50 MG tablet Take 1 tablet (50 mg total) by mouth daily. 06/09/21 09/07/21  Gildardo Pounds, NP      Allergies    Shrimp [shellfish allergy], Beef-derived products, Fruit & vegetable daily [nutritional supplements], Metformin and related, and Norvasc [amlodipine]    Review of Systems   Review of Systems  Constitutional:  Negative for chills and fever.  HENT:  Negative for congestion and rhinorrhea.   Eyes:  Negative for redness and visual disturbance.  Respiratory:  Negative for shortness of breath and wheezing.   Cardiovascular:  Negative for chest pain and palpitations.  Gastrointestinal:  Negative for abdominal pain, nausea and vomiting.  Genitourinary:  Negative for dysuria and urgency.  Musculoskeletal:  Negative for arthralgias and myalgias.  Skin:  Negative for pallor and wound.  Neurological:  Negative for dizziness and headaches.   Physical Exam Updated Vital Signs  BP (!) 187/77 (BP Location: Right Arm)    Pulse 73    Temp 98.1 F (36.7 C) (Oral)    Resp 16    Ht 4\' 11"  (1.499 m)    Wt 46.7 kg    LMP 05/11/2016    SpO2 100%    BMI 20.80 kg/m  Physical Exam Vitals and nursing note reviewed.  Constitutional:      General: She is not in acute distress.    Appearance: She is well-developed. She is not diaphoretic.  HENT:     Head: Normocephalic and atraumatic.  Eyes:     Pupils: Pupils are equal, round, and reactive to light.  Cardiovascular:     Rate and Rhythm: Normal  rate and regular rhythm.     Heart sounds: No murmur heard.   No friction rub. No gallop.  Pulmonary:     Effort: Pulmonary effort is normal.     Breath sounds: No wheezing or rales.  Abdominal:     General: There is no distension.     Palpations: Abdomen is soft.     Tenderness: There is no abdominal tenderness.  Musculoskeletal:        General: No tenderness.     Cervical back: Normal range of motion and neck supple.  Skin:    General: Skin is warm and dry.  Neurological:     Mental Status: She is alert and oriented to person, place, and time.  Psychiatric:        Behavior: Behavior normal.    ED Results / Procedures / Treatments   Labs (all labs ordered are listed, but only abnormal results are displayed) Labs Reviewed - No data to display  EKG None  Radiology No results found.  Procedures Procedures    Medications Ordered in ED Medications - No data to display  ED Course/ Medical Decision Making/ A&P                           Medical Decision Making Risk Decision regarding hospitalization.   76 yo F sent from oncology office for admission for hyponatremia.  Patient was just hospitalized for COVID and has improved since.  Family feels that she is continue to improve and was there today for routine follow-up.  Got further history from family.  They feel like she has been doing well eating and drinking normally doing her normal household acts.  Has been able to make meals.  Patient's records were reviewed and she had a recent PCP visit and was counseled to stop taking her hydrochlorothiazide.  At that time the sodium was quite a bit higher than today.  Today she was seen in the oncology clinic and her sodium down trended to 122.  Will discuss with medicine.  The patients results and plan were reviewed and discussed.   Any x-rays performed were independently reviewed by myself.   Differential diagnosis were considered with the presenting HPI.  Medications - No  data to display  Vitals:   06/16/21 1439 06/16/21 1505  BP: (!) 187/77   Pulse: 73   Resp: 16   Temp: 98.1 F (36.7 C)   TempSrc: Oral   SpO2: 100%   Weight:  46.7 kg  Height:  4\' 11"  (1.499 m)    Final diagnoses:  Hyponatremia    Admission/ observation were discussed with the admitting physician, patient and/or family and they are comfortable with the plan.  Final Clinical Impression(s) / ED Diagnoses Final diagnoses:  Hyponatremia    Rx / DC Orders ED Discharge Orders     None         Deno Etienne, DO 06/16/21 1536

## 2021-06-16 NOTE — Progress Notes (Signed)
Pt came into the office today for bp check  

## 2021-06-16 NOTE — H&P (Signed)
History and Physical    Olivia Williamson [id number] DOB: 1945-11-23 DOA: 06/16/2021  PCP: Marcine Matar, MD  [facility]  Chief Complaint: abnormal lab values  HPI: Olivia Williamson is a 76 y.o. female with medical history significant of DM2, HTN, Hgb E disease/chronic anemia, chronic thrombocytopenia, tuberculosis. Presenting with hyponatremia. She was having follow [facility]. Upon arrival labs were drawn. It was noted that she was hyponatremic w/ a Na+ 122. It was recommended that she come to the ED for evaluation.   Per family at bedside, the patient has been in her normal state of heal since her discharge to home on 06/02/21. She has easily performed her ADLs. She has not had any complaints. Her appetite has been adequate. There have been no urinary complaints. She is surprised that there is anything wrong.  ED Course: [facility] was called for admission.   Review of Systems:  Review of systems is otherwise negative for all not mentioned in HPI.   PMHx Past Medical History:  Diagnosis Date   Arthritis    Diabetes mellitus without complication (HCC)    stopped DM meds 2004   GERD (gastroesophageal reflux disease)    Hypertension    meds stopped 1 week ago , need to get renewed   Tuberculosis    ACTIVE    PSHx Past Surgical History:  Procedure Laterality Date   ABDOMINAL HYSTERECTOMY     CHOLECYSTECTOMY N/A 05/02/2014   Procedure: LAPAROSCOPIC CHOLECYSTECTOMY ;  Surgeon: Abigail Miyamoto, MD;  Location: [facility];  Service: General;  Laterality: N/A;  laparoscopic cholecystectomy   COLONOSCOPY     VIDEO BRONCHOSCOPY Bilateral 01/10/2016   Procedure: VIDEO BRONCHOSCOPY WITHOUT FLUORO;  Surgeon: Leslye Peer, MD;  Location: [facility] END[facility]COPY;  Service: Cardiopulmonary;  Laterality: Bilateral;    SocHx  reports that she has never smoked. She has never used smokeless tobacco. She reports that she does not drink alcohol and does not use drugs.  Allergies   Allergen Reactions   Shrimp [Shellfish Allergy]     Sneezing and SOB   Beef-Derived Products Other (See Comments)    Generalized burning sensation   Fruit & Vegetable Daily [Nutritional Supplements] Other (See Comments)    Orange causes lower extremity burning   Metformin And Related Diarrhea   Norvasc [Amlodipine]     Le EDEMA    FamHx Family History  Problem Relation Age of Onset   Heart disease Maternal Uncle 78    Prior to Admission medications   Medication Sig Start Date End Date Taking? Authorizing Provider  b complex vitamins capsule Take 1 capsule by mouth daily. 06/16/21   Johney Maine, MD  B-D UF III MINI PEN NEEDLES 31G X 5 MM MISC USE TO INJECT LANTUS ONCE A DAY AND NOVOLOG TWICE DAILY. (TOTAL OF 3 INJECTIONS DAILY) 03/08/21   Marcine Matar, MD  Cholecalciferol (VITAMIN D3) 10 MCG (400 UNIT) tablet TAKE 2 TABLETS BY MOUTH EVERY DAY Patient taking differently: Take 800 Units by mouth daily. 04/16/21   Marcine Matar, MD  CVS CALCIUM 600 & VITAMIN D3 600-20 MG-MCG TABS TAKE 1 TABLET (600 MG TOTAL) BY MOUTH 2 (TWO) TIMES DAILY WITH A MEAL. Patient taking differently: Take 1 tablet by mouth in the morning and at bedtime. 05/26/21   Marcine Matar, MD  insulin aspart (NOVOLOG FLEXPEN) 100 UNIT/ML FlexPen 8 units with breakfast and dinner Patient taking differently: 8 Units 2 (two) times daily with a  meal. 03/14/20   Marcine Matar, MD  insulin glargine (LANTUS SOLOSTAR) 100 UNIT/ML Solostar Pen Inject 25 Units into the skin at bedtime. 06/02/21   Leroy Sea, MD  iron polysaccharides (NIFEREX) 150 MG capsule Take 1 capsule (150 mg total) by mouth daily. 06/16/21   Johney Maine, MD  losartan (COZAAR) 50 MG tablet Take 1 tablet (50 mg total) by mouth daily. 06/09/21 09/07/21  Claiborne Rigg, NP    Physical Exam: Vitals:   06/16/21 1439 06/16/21 1505  BP: (!) 187/77   Pulse: 73   Resp: 16   Temp: 98.1 F (36.7 C)   TempSrc: Oral   SpO2:  100%   Weight:  46.7 kg  Height:  4\' 11"  (1.499 m)    General: 76 y.o. female resting in bed in NAD Eyes: PERRL, normal sclera ENMT: Nares patent w/o discharge, orophaynx clear, dentition normal, ears w/o discharge/lesions/ulcers Neck: Supple, trachea midline Cardiovascular: RRR, +S1, S2, no m/g/r, equal pulses throughout Respiratory: CTABL, no w/r/r, normal WOB GI: BS+, NDNT, no masses noted, no organomegaly noted MSK: No e/c/c Neuro: A&O x 3, no focal deficits Psyc: Appropriate interaction and affect, calm/cooperative  Labs on Admission: I have personally reviewed following labs and imaging studies  CBC: Recent Labs  Lab 06/16/21 1212  WBC 4.6  NEUTROABS 3.6  HGB 8.5*  HCT 23.3*  MCV 67.7*  PLT 81*   Basic Metabolic Panel: Recent Labs  Lab 06/16/21 1212  NA 122*  K 4.8  CL 90*  CO2 27  GLUCOSE 214*  BUN 36*  CREATININE 1.10*  CALCIUM 9.2   GFR: Estimated Creatinine Clearance: 31.1 mL/min (A) (by C-G formula based on SCr of 1.1 mg/dL (H)). Liver Function Tests: Recent Labs  Lab 06/16/21 1212  AST 25  ALT 33  ALKPHOS 86  BILITOT 0.4  PROT 7.0  ALBUMIN 3.7   No results for input(s): LIPASE, AMYLASE in the last 168 hours. No results for input(s): AMMONIA in the last 168 hours. Coagulation Profile: No results for input(s): INR, PROTIME in the last 168 hours. Cardiac Enzymes: No results for input(s): CKTOTAL, CKMB, CKMBINDEX, TROPONINI in the last 168 hours. BNP (last 3 results) No results for input(s): PROBNP in the last 8760 hours. HbA1C: No results for input(s): HGBA1C in the last 72 hours. CBG: No results for input(s): GLUCAP in the last 168 hours. Lipid Profile: No results for input(s): CHOL, HDL, LDLCALC, TRIG, CHOLHDL, LDLDIRECT in the last 72 hours. Thyroid Function Tests: No results for input(s): TSH, T4TOTAL, FREET4, T3FREE, THYROIDAB in the last 72 hours. Anemia Panel: No results for input(s): VITAMINB12, FOLATE, FERRITIN, TIBC, IRON,  RETICCTPCT in the last 72 hours. Urine analysis:    Component Value Date/Time   COLORURINE YELLOW 06/01/2021 0752   APPEARANCEUR CLOUDY (A) 06/01/2021 0752   LABSPEC 1.025 06/01/2021 0752   PHURINE 5.5 06/01/2021 0752   GLUCOSEU NEGATIVE 06/01/2021 0752   HGBUR NEGATIVE 06/01/2021 0752   BILIRUBINUR NEGATIVE 06/01/2021 0752   BILIRUBINUR negative 02/11/2021 1721   BILIRUBINUR negative 07/27/2017 1626   KETONESUR NEGATIVE 06/01/2021 0752   PROTEINUR 100 (A) 06/01/2021 0752   UROBILINOGEN 0.2 02/11/2021 1721   NITRITE NEGATIVE 06/01/2021 0752   LEUKOCYTESUR NEGATIVE 06/01/2021 0752    Radiological Exams on Admission: No results found.  EKG: None obtained in ED  Assessment/Plan Hyponatremia     - admit to inpt, tele     - add fluids, check Uosm, UNa+     - check q6h  renal fxn panel; limit Na+ rise to 8pts/day  HTN     - resume home regimen  Chronic microcytic anemia Chronic thrombocytopenia     - review of Dr. Clyda GreenerKale's note from earlier this month shows Hx of Hgb E disease; he believes her baseline is somewhere between 7.5 and 9.5. Continue outpt w/u.   DM2     - SSI, A1c, DM diet, glucose checks  Recent COVID infection     - completed paxlovid tx, no symptoms, no need for isolation at this time  DVT prophylaxis: SCDs Code Status: FULL  Family Communication: w/ dtr at bedside  Consults called: None   Status is: Inpatient  Remains inpatient appropriate because: severity of illness  Teddy Spikeyrone A Vivien Barretto DO [facility]  If 7PM-7AM, please contact night-coverage [email or web address]  06/16/2021, 3:17 PM

## 2021-06-17 DIAGNOSIS — E871 Hypo-osmolality and hyponatremia: Secondary | ICD-10-CM | POA: Diagnosis not present

## 2021-06-17 LAB — RENAL FUNCTION PANEL
Albumin: 3.2 g/dL — ABNORMAL LOW (ref 3.5–5.0)
Albumin: 3.2 g/dL — ABNORMAL LOW (ref 3.5–5.0)
Anion gap: 6 (ref 5–15)
Anion gap: 5 (ref 5–15)
BUN: 31 mg/dL — ABNORMAL HIGH (ref 8–23)
BUN: 26 mg/dL — ABNORMAL HIGH (ref 8–23)
CO2: 23 mmol/L (ref 22–32)
CO2: 24 mmol/L (ref 22–32)
Calcium: 8.7 mg/dL — ABNORMAL LOW (ref 8.9–10.3)
Calcium: 8.7 mg/dL — ABNORMAL LOW (ref 8.9–10.3)
Chloride: 99 mmol/L (ref 98–111)
Chloride: 101 mmol/L (ref 98–111)
Creatinine, Ser: 0.85 mg/dL (ref 0.44–1.00)
Creatinine, Ser: 0.75 mg/dL (ref 0.44–1.00)
GFR, Estimated: >60 mL/min (ref >60)
GFR, Estimated: >60 mL/min (ref >60)
Glucose, Bld: 239 mg/dL — ABNORMAL HIGH (ref 70–99)
Glucose, Bld: 104 mg/dL — ABNORMAL HIGH (ref 70–99)
Phosphorus: 2.9 mg/dL (ref 2.5–4.6)
Phosphorus: 3.2 mg/dL (ref 2.5–4.6)
Potassium: 4.2 mmol/L (ref 3.5–5.1)
Potassium: 4.1 mmol/L (ref 3.5–5.1)
Sodium: 128 mmol/L — ABNORMAL LOW (ref 135–145)
Sodium: 130 mmol/L — ABNORMAL LOW (ref 135–145)

## 2021-06-17 LAB — CBC WITH DIFFERENTIAL/PLATELET
Abs Immature Granulocytes: 0.01 10*3/uL (ref 0.00–0.07)
Basophils Absolute: 0 10*3/uL (ref 0.0–0.1)
Basophils Relative: 1 %
Eosinophils Absolute: 0.1 10*3/uL (ref 0.0–0.5)
Eosinophils Relative: 4 %
HCT: 22.7 % — ABNORMAL LOW (ref 36.0–46.0)
Hemoglobin: 7.9 g/dL — ABNORMAL LOW (ref 12.0–15.0)
Immature Granulocytes: 0 %
Lymphocytes Relative: 26 %
Lymphs Abs: 0.8 10*3/uL (ref 0.7–4.0)
MCH: 24.5 pg — ABNORMAL LOW (ref 26.0–34.0)
MCHC: 34.8 g/dL (ref 30.0–36.0)
MCV: 70.3 fL — ABNORMAL LOW (ref 80.0–100.0)
Monocytes Absolute: 0.3 10*3/uL (ref 0.1–1.0)
Monocytes Relative: 9 %
Neutro Abs: 1.9 10*3/uL (ref 1.7–7.7)
Neutrophils Relative %: 60 %
Platelets: 73 10*3/uL — ABNORMAL LOW (ref 150–400)
RBC: 3.23 MIL/uL — ABNORMAL LOW (ref 3.87–5.11)
RDW: 22.3 % — ABNORMAL HIGH (ref 11.5–15.5)
WBC: 3.1 10*3/uL — ABNORMAL LOW (ref 4.0–10.5)
nRBC: 0 % (ref 0.0–0.2)

## 2021-06-17 LAB — GLUCOSE, CAPILLARY
Glucose-Capillary: 108 mg/dL — ABNORMAL HIGH (ref 70–99)
Glucose-Capillary: 169 mg/dL — ABNORMAL HIGH (ref 70–99)

## 2021-06-17 NOTE — Discharge Instructions (Signed)
Olivia Williamson,  You were in the hospital with hyponatremia. This improved with IV fluids. Please ensure you stay well hydrated.

## 2021-06-17 NOTE — Care Management CC44 (Signed)
Condition Code 44 Documentation Completed  Patient Details  Name: Olivia Williamson MRN: [id number] Date of Birth: 1946-01-26   Condition Code 44 given:  Yes Patient signature on Condition Code 44 notice:  Yes Documentation of 2 MD's agreement:  Yes Code 44 added to claim:  Yes    Leeroy Cha, RN 06/17/2021, 1:54 PM

## 2021-06-17 NOTE — TOC Initial Note (Signed)
Transition of Care [facility]) - Initial/Assessment Note    Patient Details  Name: Olivia Williamson MRN: [id number] Date of Birth: 1945-08-03  Transition of Care [facility]) CM/SW Contact:    Leeroy Cha, RN Phone Number: 06/17/2021, 11:22 AM  Clinical Narrative:                  Transition of Care [facility]) Screening Note   Patient Details  Name: Olivia Williamson Date of Birth: June 09, 1945   Transition of Care [facility]) CM/SW Contact:    Leeroy Cha, RN Phone Number: 06/17/2021, 11:22 AM    Transition of Care Department [facility]) has reviewed patient and no TOC needs have been identified at this time. We will continue to monitor patient advancement through interdisciplinary progression rounds. If new patient transition needs arise, please place a TOC consult.    Expected Discharge Plan: Home/Self Care Barriers to Discharge: Continued Medical Work up   Patient Goals and CMS Choice Patient states their goals for this hospitalization and ongoing recovery are:: unable to state      Expected Discharge Plan and Services Expected Discharge Plan: Home/Self Care   Discharge Planning Services: CM Consult   Living arrangements for the past 2 months: Single Family Home                                      Prior Living Arrangements/Services Living arrangements for the past 2 months: Single Family Home Lives with:: Self Patient language and need for interpreter reviewed:: Yes              Criminal Activity/Legal Involvement Pertinent to Current Situation/Hospitalization: No - Comment as needed  Activities of Daily Living Home Assistive Devices/Equipment: Cane (specify quad or straight) ADL Screening (condition at time of admission) Patient's cognitive ability adequate to safely complete daily activities?: Yes Is the patient deaf or have difficulty hearing?: Yes Does the patient have difficulty seeing, even when wearing glasses/contacts?: Yes Does the patient have difficulty  concentrating, remembering, or making decisions?: Yes Patient able to express need for assistance with ADLs?: Yes Does the patient have difficulty dressing or bathing?: No Independently performs ADLs?: No Communication: Independent Dressing (OT): Independent Grooming: Independent Feeding: Independent Bathing: Needs assistance Is this a change from baseline?: Pre-admission baseline Toileting: Needs assistance Is this a change from baseline?: Pre-admission baseline In/Out Bed: Independent with device (comment) Does the patient have difficulty walking or climbing stairs?: Yes Weakness of Legs: Both Weakness of Arms/Hands: None  Permission Sought/Granted                  Emotional Assessment Appearance:: Appears stated age     Orientation: : Oriented to Self, Oriented to  Time, Oriented to Place, Oriented to Situation Alcohol / Substance Use: Not Applicable Psych Involvement: No (comment)  Admission diagnosis:  Hyponatremia [E87.1] Patient Active Problem List   Diagnosis Date Noted   COVID-19 05/29/2021   Stage 3a chronic kidney disease (CKD) ([place]) 05/29/2021   COVID-19 virus vaccination declined 02/13/2020   Allergic rhinitis due to allergen 05/02/2019   Periodontal disease 05/02/2019   Chronic cough 04/13/2019   Non-intractable vomiting    Hyponatremia    Community acquired pneumonia 03/16/2019   Positive for macroalbuminuria 02/05/2019   Pathological fracture of vertebra due to osteoporosis with routine healing 07/28/2017   Lumbar radiculopathy 02/02/2017   Chronic left-sided thoracic back pain 02/02/2017   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   Multinodular goiter 11/01/2016   Thyroid nodule 04/13/2016   History of tuberculosis 01/09/2016   Generalized weakness 12/15/2015   Pancytopenia ([place]) 12/15/2015   Essential hypertension    GERD (gastroesophageal reflux disease)    Diabetes mellitus with neurological manifestations ([place])    PCP:  [name],  MD Pharmacy:   [id number] [place], [place] [address] Phone: [phone] Fax: [phone]  [facility], [address] [place] [place] [address] Phone: [phone] Fax: [phone]     Social Determinants of Health (SDOH) Interventions    Readmission Risk Interventions No flowsheet data found.

## 2021-06-17 NOTE — Care Management Obs Status (Signed)
MEDICARE OBSERVATION STATUS NOTIFICATION   Patient Details  Name: Olivia Williamson MRN: [id number] Date of Birth: [birth year]   Medicare Observation Status Notification Given:  Yes    Golda Acre, RN 06/17/2021, 1:54 PM

## 2021-06-17 NOTE — TOC Transition Note (Incomplete)
Transition of Care [facility]) - CM/SW Discharge Note   Patient Details  Name: Olivia Williamson MRN: [id number] Date of Birth: 06/24/1945  Transition of Care [facility]) CM/SW Contact:  Golda Acre, RN Phone Number: 06/17/2021, 1:56 PM   Clinical Narrative:     Dcd to home    Barriers to Discharge: Continued Medical Work up   Patient Goals and CMS Choice Patient states their goals for this hospitalization and ongoing recovery are:: unable to state      Discharge Placement                       Discharge Plan and Services   Discharge Planning Services: CM Consult                                 Social Determinants of Health (SDOH) Interventions     Readmission Risk Interventions No flowsheet data found.

## 2021-06-17 NOTE — Plan of Care (Signed)
Problem: Education: Goal: Knowledge of General Education information will improve Description: Including pain rating scale, medication(s)/side effects and non-pharmacologic comfort measures Outcome: Progressing   Problem: Health Behavior/Discharge Planning: Goal: Ability to manage health-related needs will improve Outcome: Progressing   Problem: Clinical Measurements: Goal: Ability to maintain clinical measurements within normal limits will improve Outcome: Progressing Goal: Will remain free from infection Outcome: Completed/Met Goal: Diagnostic test results will improve Outcome: Completed/Met Goal: Respiratory complications will improve Outcome: Completed/Met   Problem: Activity: Goal: Risk for activity intolerance will decrease Outcome: Completed/Met   Problem: Nutrition: Goal: Adequate nutrition will be maintained Outcome: Completed/Met   Problem: Elimination: Goal: Will not experience complications related to bowel motility Outcome: Completed/Met Goal: Will not experience complications related to urinary retention Outcome: Completed/Met

## 2021-06-17 NOTE — Discharge Summary (Signed)
Physician Discharge Summary  Stefan Karen KJZ:[id number] DOB: May 03, 1946 DOA: 06/16/2021  PCP: Marcine Matar, MD  Admit date: 06/16/2021 Discharge date: 06/17/2021  Admitted From: Home Disposition: Home  Recommendations for Outpatient Follow-up:  Follow up with PCP in 1 week Please obtain BMP/CBC in one week Please follow up on the following pending results: None  Home Health: None Equipment/Devices: None  Discharge Condition: Stable CODE STATUS: Full code Diet recommendation: Regular diet/carb modified   Brief/Interim Summary:  Admission HPI written by Teddy Spike, DO   HPI: Olivia Williamson is a 76 y.o. female with medical history significant of DM2, HTN, Hgb E disease/chronic anemia, chronic thrombocytopenia, tuberculosis. Presenting with hyponatremia. She was having follow [facility]. Upon arrival labs were drawn. It was noted that she was hyponatremic w/ a Na+ 122. It was recommended that she come to the ED for evaluation.    Per family at bedside, the patient has been in her normal state of heal since her discharge to home on 06/02/21. She has easily performed her ADLs. She has not had any complaints. Her appetite has been adequate. There have been no urinary complaints. She is surprised that there is anything wrong.   Hospital course:  Hyponatremia Likely from poor oral intake. Sodium of 122 on admission. Normal saline IV fluids given with improvement of sodium up to 130 prior to discharge.  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 Baseline hemoglobin of 7.5-9.5. No current indication for transfusion.  Diabetes mellitus, type 2 Continue home Lantus   Discharge Instructions   Allergies as of 06/17/2021       Reactions   Shrimp [shellfish Allergy] Shortness Of Breath, Other (See Comments)   Sneezing, also   Beef-derived Products Other (See Comments)   Generalized "burning sensation"    Fruit & Vegetable Daily [nutritional Supplements] Other (See Comments)   Oranges cause lower extremity burning   Metformin And Related Diarrhea   Norvasc [amlodipine] Swelling, Other (See Comments)   Edema of the lower extremities        Medication List     TAKE these medications    b complex vitamins capsule Take 1 capsule by mouth daily. What changed: when to take this   B-D UF III MINI PEN NEEDLES 31G X 5 MM Misc Generic drug: Insulin Pen Needle USE TO INJECT LANTUS ONCE A DAY AND NOVOLOG TWICE DAILY. (TOTAL OF 3 INJECTIONS DAILY)   [facility] Calcium 600 & Vitamin D3 600-20 MG-MCG Tabs Generic drug: Calcium Carb-Cholecalciferol TAKE 1 TABLET (600 MG TOTAL) BY MOUTH 2 (TWO) TIMES DAILY WITH A MEAL. What changed: See the new instructions.   iron polysaccharides 150 MG capsule Commonly known as: NIFEREX Take 1 capsule (150 mg total) by mouth daily.   Lantus SoloStar 100 UNIT/ML Solostar Pen Generic drug: insulin glargine Inject 25 Units into the skin at bedtime.   losartan 50 MG tablet Commonly known as: COZAAR Take 1 tablet (50 mg total) by mouth daily. What changed: when to take this   NovoLOG FlexPen 100 UNIT/ML FlexPen Generic drug: insulin aspart 8 units with breakfast and dinner What changed:  how much to take when to take this additional instructions   Vitamin D3 10 MCG (400 UNIT) tablet TAKE 2 TABLETS BY MOUTH EVERY DAY What changed: when to take this        Follow-up Information     Marcine Matar, MD. Schedule an appointment as soon  as possible for a visit in 1 week(s).   Specialty: Internal Medicine Why: For hospital follow-up Contact information: [address] [place] [address] [phone]                Allergies  Allergen Reactions   Shrimp [Shellfish Allergy] Shortness Of Breath and Other (See Comments)    Sneezing, also   Beef-Derived Products Other (See Comments)    Generalized "burning sensation"   Fruit &  Vegetable Daily [Nutritional Supplements] Other (See Comments)    Oranges cause lower extremity burning   Metformin And Related Diarrhea   Norvasc [Amlodipine] Swelling and Other (See Comments)    Edema of the lower extremities    Consultations: None   Procedures/Studies: DG Chest 2 View  Result Date: 06/09/2021 CLINICAL DATA:  Right lower lobe pneumonia EXAM: CHEST - 2 VIEW COMPARISON:  Chest radiograph dated June 02, 2021. FINDINGS: The heart is enlarged. Atherosclerotic calcification of the aortic arch. Right basilar opacity now has a more linear appearance, likely representing atelectasis. IMPRESSION: 1. Stable cardiomegaly. 2. Right basilar linear opacity, likely atelectasis. Follow-up examination in 8-12 weeks for resolution is recommended. Electronically Signed   By: Larose HiresImran  Ahmed D.O.   On: 06/09/2021 16:37   DG Chest 2 View  Result Date: 05/29/2021 CLINICAL DATA:  COVID. Hx of DM, GERD, HTN, TB. Pt is a nonsmoker. EXAM: CHEST - 2 VIEW COMPARISON:  Chest x-ray 03/16/2019, CT chest 03/16/2019 FINDINGS: The heart and mediastinal contours are unchanged. Aortic calcification. Low lung volumes. Hazy airspace opacity within the left lower lung zone likely due to artifact from overlying soft tissues. No pulmonary edema. No pleural effusion. No pneumothorax. No acute osseous abnormality. IMPRESSION: Low lung volumes.  No active cardiopulmonary disease. Electronically Signed   By: Tish FredericksonMorgane  Naveau M.D.   On: 05/29/2021 15:43   CT Head Wo Contrast  Result Date: 05/28/2021 CLINICAL DATA:  Headache.  Neuro deficit. EXAM: CT HEAD WITHOUT CONTRAST TECHNIQUE: Contiguous axial images were obtained from the base of the skull through the vertex without intravenous contrast. COMPARISON:  CT head 05/11/2016 FINDINGS: Brain: No evidence of acute infarction, hemorrhage, hydrocephalus, extra-axial collection or mass lesion/mass effect. Vascular: Negative for hyperdense vessel Skull: Negative Sinuses/Orbits:  Moderate mucosal edema throughout the paranasal sinuses. Bilateral cataract extraction Other: None IMPRESSION: Negative CT head Moderate sinus mucosal edema. Electronically Signed   By: Marlan Palauharles  Clark M.D.   On: 05/28/2021 17:04   CT ABDOMEN PELVIS W CONTRAST  Result Date: 05/29/2021 CLINICAL DATA:  Epigastric pain. EXAM: CT ABDOMEN AND PELVIS WITH CONTRAST TECHNIQUE: Multidetector CT imaging of the abdomen and pelvis was performed using the standard protocol following bolus administration of intravenous contrast. CONTRAST:  75mL OMNIPAQUE IOHEXOL 300 MG/ML  SOLN COMPARISON:  03/16/2019. FINDINGS: Lower chest: The heart is enlarged and there is a trace pericardial effusion. There is atelectasis at the left lung base. Patchy airspace disease with associated scarring noted at the right lung base. Hepatobiliary: A few subcentimeter hypodensities are present in the liver which are unchanged from the prior exam. No biliary ductal dilatation. The gallbladder is surgically absent. Pancreas: Unremarkable. No pancreatic ductal dilatation or surrounding inflammatory changes. Spleen: Normal in size without focal abnormality. Adrenals/Urinary Tract: Adrenal glands are unremarkable. Kidneys are normal, without renal calculi, focal lesion, or hydronephrosis. Bladder is unremarkable. Stomach/Bowel: There is a small hiatal hernia. No bowel obstruction, free air or pneumatosis. A normal appendix is seen in the right lower quadrant. No focal bowel wall thickening. Vascular/Lymphatic: Aortic  atherosclerosis. No enlarged abdominal or pelvic lymph nodes. Reproductive: The uterus is in-situ. A small cystic structure is seen in the right adnexa measuring 1.7 cm. There is a small cystic structure in the left adnexa measuring 1.5 cm. Other: No free fluid. Musculoskeletal: Degenerative changes are present in the thoracolumbar spine. There are compression deformities with vertebral plana at T12 and L1, unchanged from 2020. IMPRESSION: 1.  No acute intra-abdominal process. 2. Small sliding hiatal hernia. 3. Mild consolidation adjacent to scarring in the right lower lobe, possible atelectasis or infiltrate. 4. Cardiomegaly with a trace pericardial effusion. 5. Cystic structures in the adnexal regions bilaterally measuring up to 1.7 cm. Ultrasound is suggested for further characterization on follow-up. Electronically Signed   By: Thornell SartoriusLaura  Taylor M.D.   On: 05/29/2021 04:48   DG Chest Port 1 View  Result Date: 06/02/2021 CLINICAL DATA:  Shortness of breath EXAM: PORTABLE CHEST 1 VIEW COMPARISON:  05/29/2021 FINDINGS: Transverse diameter of heart is increased. There are no signs of pulmonary edema. Small patchy infiltrate is seen in the right lower lung fields. Rest of the lung fields are unremarkable. There is no pleural effusion or pneumothorax. IMPRESSION: Cardiomegaly. Small patchy infiltrate is seen in the right lower lung fields suggesting atelectasis/pneumonia. Electronically Signed   By: Ernie AvenaPalani  Rathinasamy M.D.   On: 06/02/2021 08:41      Subjective: No concerns this morning or from overnight.  Discharge Exam: Vitals:   06/16/21 2232 06/17/21 0523  BP: (!) 162/67 (!) 164/76  Pulse: 88 73  Resp: 16 20  Temp: 99.4 F (37.4 C) 97.9 F (36.6 C)  SpO2: 99% 99%   Vitals:   06/16/21 1630 06/16/21 1851 06/16/21 2232 06/17/21 0523  BP: (!) 163/77 (!) 155/61 (!) 162/67 (!) 164/76  Pulse: 76 78 88 73  Resp:  18 16 20   Temp:  98.4 F (36.9 C) 99.4 F (37.4 C) 97.9 F (36.6 C)  TempSrc:  Oral Oral Oral  SpO2: 100% 100% 99% 99%  Weight:      Height:        General: Pt is alert, awake, not in acute distress Cardiovascular: RRR, S1/S2 +, no rubs, no gallops Respiratory: CTA bilaterally, no wheezing, no rhonchi Abdominal: Soft, NT, ND, bowel sounds + Extremities: no edema, no cyanosis    The results of significant diagnostics from this hospitalization (including imaging, microbiology, ancillary and laboratory) are listed  below for reference.     Microbiology: Recent Results (from the past 240 hour(s))  Resp Panel by RT-PCR (Flu A&B, Covid) Nasopharyngeal Swab     Status: None   Collection Time: 06/16/21  5:37 PM   Specimen: Nasopharyngeal Swab; Nasopharyngeal(NP) swabs in vial transport medium  Result Value Ref Range Status   SARS Coronavirus 2 by RT PCR NEGATIVE NEGATIVE Final    Comment: (NOTE) SARS-CoV-2 target nucleic acids are NOT DETECTED.  The SARS-CoV-2 RNA is generally detectable in upper respiratory specimens during the acute phase of infection. The lowest concentration of SARS-CoV-2 viral copies this assay can detect is 138 copies/mL. A negative result does not preclude SARS-Cov-2 infection and should not be used as the sole basis for treatment or other patient management decisions. A negative result may occur with  improper specimen collection/handling, submission of specimen other than nasopharyngeal swab, presence of viral mutation(s) within the areas targeted by this assay, and inadequate number of viral copies(<138 copies/mL). A negative result must be combined with clinical observations, patient history, and epidemiological information. The expected result is Negative.  Fact Sheet for Patients:  BloggerCourse.com  Fact Sheet for Healthcare Providers:  SeriousBroker.it  This test is no t yet approved or cleared by the [place] FDA and  has been authorized for detection and/or diagnosis of SARS-CoV-2 by FDA under an Emergency Use Authorization (EUA). This EUA will remain  in effect (meaning this test can be used) for the duration of the COVID-19 declaration under Section 564(b)(1) of the Act, 21 U.S.C.section 360bbb-3(b)(1), unless the authorization is terminated  or revoked sooner.       Influenza A by PCR NEGATIVE NEGATIVE Final   Influenza B by PCR NEGATIVE NEGATIVE Final    Comment: (NOTE) The Xpert Xpress  SARS-CoV-2/FLU/RSV plus assay is intended as an aid in the diagnosis of influenza from Nasopharyngeal swab specimens and should not be used as a sole basis for treatment. Nasal washings and aspirates are unacceptable for Xpert Xpress SARS-CoV-2/FLU/RSV testing.  Fact Sheet for Patients: BloggerCourse.com  Fact Sheet for Healthcare Providers: SeriousBroker.it  This test is not yet approved or cleared by the [place] FDA and has been authorized for detection and/or diagnosis of SARS-CoV-2 by FDA under an Emergency Use Authorization (EUA). This EUA will remain in effect (meaning this test can be used) for the duration of the COVID-19 declaration under Section 564(b)(1) of the Act, 21 U.S.C. section 360bbb-3(b)(1), unless the authorization is terminated or revoked.  Performed at [facility], [address] [place], [place] [address]      Labs: BNP (last 3 results) Recent Labs    03/13/21 1547 06/02/21 0323  BNP 179.3* 27.4   Basic Metabolic Panel: Recent Labs  Lab 06/16/21 1212 06/16/21 1917 06/17/21 0019 06/17/21 0621  NA 122* 127* 128* 130*  K 4.8 4.6 4.2 4.1  CL 90* 96* 99 101  CO2 27 25 23 24   GLUCOSE 214* 113* 239* 104*  BUN 36* 34* 31* 26*  CREATININE 1.10* 0.81 0.85 0.75  CALCIUM 9.2 9.3 8.7* 8.7*  PHOS  --  3.4 2.9 3.2   Liver Function Tests: Recent Labs  Lab 06/16/21 1212 06/16/21 1917 06/17/21 0019 06/17/21 0621  AST 25  --   --   --   ALT 33  --   --   --   ALKPHOS 86  --   --   --   BILITOT 0.4  --   --   --   PROT 7.0  --   --   --   ALBUMIN 3.7 3.3* 3.2* 3.2*   No results for input(s): LIPASE, AMYLASE in the last 168 hours. No results for input(s): AMMONIA in the last 168 hours. CBC: Recent Labs  Lab 06/16/21 1212 06/17/21 0019  WBC 4.6 3.1*  NEUTROABS 3.6 1.9  HGB 8.5* 7.9*  HCT 23.3* 22.7*  MCV 67.7* 70.3*  PLT 81* 73*   Cardiac Enzymes: No results for  input(s): CKTOTAL, CKMB, CKMBINDEX, TROPONINI in the last 168 hours. BNP: Invalid input(s): POCBNP CBG: Recent Labs  Lab 06/16/21 2230 06/17/21 0742  GLUCAP 286* 108*   D-Dimer No results for input(s): DDIMER in the last 72 hours. Hgb A1c No results for input(s): HGBA1C in the last 72 hours. Lipid Profile No results for input(s): CHOL, HDL, LDLCALC, TRIG, CHOLHDL, LDLDIRECT in the last 72 hours. Thyroid function studies No results for input(s): TSH, T4TOTAL, T3FREE, THYROIDAB in the last 72 hours.  Invalid input(s): FREET3 Anemia work up No results for input(s): VITAMINB12, FOLATE, FERRITIN, TIBC, IRON, RETICCTPCT in the last 72  hours. Urinalysis    Component Value Date/Time   COLORURINE YELLOW 06/01/2021 0752   APPEARANCEUR CLOUDY (A) 06/01/2021 0752   LABSPEC 1.025 06/01/2021 0752   PHURINE 5.5 06/01/2021 0752   GLUCOSEU NEGATIVE 06/01/2021 0752   HGBUR NEGATIVE 06/01/2021 0752   BILIRUBINUR NEGATIVE 06/01/2021 0752   BILIRUBINUR negative 02/11/2021 1721   BILIRUBINUR negative 07/27/2017 1626   KETONESUR NEGATIVE 06/01/2021 0752   PROTEINUR 100 (A) 06/01/2021 0752   UROBILINOGEN 0.2 02/11/2021 1721   NITRITE NEGATIVE 06/01/2021 0752   LEUKOCYTESUR NEGATIVE 06/01/2021 0752   Sepsis Labs Invalid input(s): PROCALCITONIN,  WBC,  LACTICIDVEN Microbiology Recent Results (from the past 240 hour(s))  Resp Panel by RT-PCR (Flu A&B, Covid) Nasopharyngeal Swab     Status: None   Collection Time: 06/16/21  5:37 PM   Specimen: Nasopharyngeal Swab; Nasopharyngeal(NP) swabs in vial transport medium  Result Value Ref Range Status   SARS Coronavirus 2 by RT PCR NEGATIVE NEGATIVE Final    Comment: (NOTE) SARS-CoV-2 target nucleic acids are NOT DETECTED.  The SARS-CoV-2 RNA is generally detectable in upper respiratory specimens during the acute phase of infection. The lowest concentration of SARS-CoV-2 viral copies this assay can detect is 138 copies/mL. A negative result does  not preclude SARS-Cov-2 infection and should not be used as the sole basis for treatment or other patient management decisions. A negative result may occur with  improper specimen collection/handling, submission of specimen other than nasopharyngeal swab, presence of viral mutation(s) within the areas targeted by this assay, and inadequate number of viral copies(<138 copies/mL). A negative result must be combined with clinical observations, patient history, and epidemiological information. The expected result is Negative.  Fact Sheet for Patients:  BloggerCourse.com  Fact Sheet for Healthcare Providers:  SeriousBroker.it  This test is no t yet approved or cleared by the [place] FDA and  has been authorized for detection and/or diagnosis of SARS-CoV-2 by FDA under an Emergency Use Authorization (EUA). This EUA will remain  in effect (meaning this test can be used) for the duration of the COVID-19 declaration under Section 564(b)(1) of the Act, 21 U.S.C.section 360bbb-3(b)(1), unless the authorization is terminated  or revoked sooner.       Influenza A by PCR NEGATIVE NEGATIVE Final   Influenza B by PCR NEGATIVE NEGATIVE Final    Comment: (NOTE) The Xpert Xpress SARS-CoV-2/FLU/RSV plus assay is intended as an aid in the diagnosis of influenza from Nasopharyngeal swab specimens and should not be used as a sole basis for treatment. Nasal washings and aspirates are unacceptable for Xpert Xpress SARS-CoV-2/FLU/RSV testing.  Fact Sheet for Patients: BloggerCourse.com  Fact Sheet for Healthcare Providers: SeriousBroker.it  This test is not yet approved or cleared by the [place] FDA and has been authorized for detection and/or diagnosis of SARS-CoV-2 by FDA under an Emergency Use Authorization (EUA). This EUA will remain in effect (meaning this test can be used) for the  duration of the COVID-19 declaration under Section 564(b)(1) of the Act, 21 U.S.C. section 360bbb-3(b)(1), unless the authorization is terminated or revoked.  Performed at [facility], [address], [place], [place] [address]     SIGNED:   Jacquelin Hawking, MD [facility] 06/17/2021, 11:48 AM

## 2021-06-18 ENCOUNTER — Telehealth: Payer: Self-pay

## 2021-06-18 ENCOUNTER — Telehealth: Payer: Self-pay | Admitting: Hematology

## 2021-06-18 NOTE — Telephone Encounter (Signed)
Transition Care Management Unsuccessful Follow-up Telephone Call  Call placed with assistance of [place] interpreter Mr Nie/Language Resources  Date of discharge and from where:  06/17/2021, [facility]   Attempts:  1st Attempt  Reason for unsuccessful TCM follow-up call:  Left voice messages on # [phone] and [phone].  Call back requested to this CM. Call placed to # [phone] and the recording stated that no one was available, no option to leave a message.

## 2021-06-18 NOTE — Telephone Encounter (Signed)
Left message with follow-up appointment per 1/23 los. °

## 2021-06-19 ENCOUNTER — Telehealth: Payer: Self-pay

## 2021-06-19 NOTE — Telephone Encounter (Signed)
Transition Care Management Unsuccessful Follow-up Telephone Call  Call placed with assistance of [place] interpreter Mr Nie/Language Resources  Date of discharge and from where:  06/17/2021, [facility]  Attempts:  2nd Attempt  Reason for unsuccessful TCM follow-up call:  Left voice messages on # [phone] and [phone].  Call back requested to this CM. Call placed to # [phone] and the recording stated that no one was available, no option to leave a message.

## 2021-06-20 ENCOUNTER — Telehealth: Payer: Self-pay

## 2021-06-20 NOTE — Telephone Encounter (Signed)
Transition Care Management Unsuccessful Follow-up Telephone Call   Call placed with assistance of [place] interpreter Olivia Williamson/Language Resources  Date of discharge and from where:  06/17/2021, [facility]   Attempts:  3rd Attempt   Reason for unsuccessful TCM follow-up call:  Left voice messages on # [phone] and [phone].  Call back requested to this CM. Call placed to # [phone] and the recording stated that no one was available, no option to leave a message.

## 2021-06-23 ENCOUNTER — Other Ambulatory Visit: Payer: Self-pay | Admitting: Internal Medicine

## 2021-06-23 ENCOUNTER — Ambulatory Visit: Payer: Medicare Other

## 2021-06-23 DIAGNOSIS — E1142 Type 2 diabetes mellitus with diabetic polyneuropathy: Secondary | ICD-10-CM

## 2021-06-23 NOTE — Telephone Encounter (Signed)
Requested Prescriptions  Pending Prescriptions Disp Refills   B-D UF III MINI PEN NEEDLES 31G X 5 MM MISC [Pharmacy Med Name: BD UF MINI PEN NEEDLE [id number]] 100 each 2    Sig: USE TO INJECT LANTUS ONCE A DAY AND NOVOLOG TWICE DAILY. (TOTAL OF 3 INJECTIONS DAILY)     Endocrinology: Diabetes - Testing Supplies Passed - 06/23/2021  4:37 PM      Passed - Valid encounter within last 12 months    Recent Outpatient Visits          2 weeks ago Hospital discharge follow-up   [facility] [place], Shea Stakes, NP   3 weeks ago Essential hypertension   [facility] [place], Jeannett Senior L, RPH-CPP   2 months ago Type 2 diabetes mellitus with hyperglycemia, with long-term current use of insulin [facility])   [place] [facility] Jonah Blue B, MD   3 months ago Type 2 diabetes mellitus with hyperglycemia, with long-term current use of insulin [facility])   [place] [facility] Jonah Blue B, MD   4 months ago Type 2 diabetes mellitus with hyperglycemia, with long-term current use of insulin [facility])   [place] [facility] Marcine Matar, MD      Future Appointments            In 3 weeks Laural Benes Binnie Rail, MD [facility]

## 2021-06-23 NOTE — Progress Notes (Addendum)
Olivia Williamson   HEMATOLOGY/ONCOLOGY CLINIC NOTE  Date of Service: ..06/16/2021  Patient Care Team: [name], MD as PCP - General (Internal Medicine)  CHIEF COMPLAINTS/PURPOSE OF CONSULTATION:  Hgb E disease Anemia  HISTORY OF PRESENTING ILLNESS:   Olivia Williamson is a wonderful 76 y.o. female who has been referred to [place] by Dr .Wynetta Emery, Dalbert Batman, MD  for evaluation and management of Anemia.  Patient has a history of hypertension, dyslipidemia, diabetes, arthritis, GERD, previous history of tuberculosis (treated in 2017- [facility], Pulm - Dr Lamonte Sakai) who has been referred to [place] for further evaluation and management of chronic anemia and thrombocytopenia.  She is here with a family member and [place] interpreter for this clinic visit.  Review of labs all the way back to January 2018 revealed the patient has had chronic microcytic anemia with a hemoglobin ranging between 7-9.5.  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  With a platelet count varying between 74-108k in the last few years.  She last had a CBC with her primary care physician on 04/14/2021 with a hemoglobin of 7.4, WBC count of 4.6k and a platelet count of 89k (with note made of platelet aggregation)  Patient currently notes no overt bleeding. Her hemoglobin levels today on labs are about the same at 7.3.  She notes no overt lightheadedness, dizziness, chest pain or shortness of breath.  Feels like she is pretty close to her baseline. Notes no new medications. Is not on any regular multivitamins or B vitamins. Not on iron replacement.  Patient does not note history of frequent PRBC transfusions.  She is not sure but notes that she might have had blood transfusions " a few times".  No new bone pains.  No acute weight loss.  No new respiratory  symptoms.  INTERVAL HISTORY  Patient is here for follow-up regarding her anemia and is accompanied by family member and interpreter. After her last clinic visit she was admitted to the hospital with nausea and vomiting and was noted to have COVID-19 infection and hyponatremia.  Hyponatremia improved a little bit with IV fluids.  She also received PRBC transfusion. Labs today hemoglobin of 8.5 with a normal WBC count of 4.6k platelets of 81k, Sodium CMP shows sodium of 122 blood sugar 214 BUN of 36 with a creatinine of 1.1.   MEDICAL HISTORY:  Past Medical History:  Diagnosis Date   Arthritis    Diabetes mellitus without complication ([place])    stopped DM meds 2004   GERD (gastroesophageal reflux disease)    Hypertension    meds stopped 1 week ago , need to get renewed   Tuberculosis    ACTIVE    SURGICAL HISTORY: Past Surgical History:  Procedure Laterality Date   ABDOMINAL HYSTERECTOMY     CHOLECYSTECTOMY N/A 05/02/2014   Procedure: LAPAROSCOPIC CHOLECYSTECTOMY ;  Surgeon: Coralie Keens, MD;  Location: [place];  Service: General;  Laterality: N/A;  laparoscopic cholecystectomy   COLONOSCOPY     VIDEO BRONCHOSCOPY Bilateral 01/10/2016   Procedure: VIDEO BRONCHOSCOPY WITHOUT FLUORO;  Surgeon: Collene Gobble, MD;  Location: [employer];  Service: Cardiopulmonary;  Laterality: Bilateral;    SOCIAL HISTORY: Social History   Socioeconomic History   Marital status: Widowed    Spouse name: Not on file   Number of children: Not on file   Years  of education: Not on file   Highest education level: Not on file  Occupational History   Occupation: retired  Tobacco Use   Smoking status: Never   Smokeless tobacco: Never  Substance and Sexual Activity   Alcohol use: No   Drug use: No   Sexual activity: Not on file  Other Topics Concern   Not on file  Social History Narrative   Not on file   Social Determinants of Health   Financial Resource Strain: Not on file  Food  Insecurity: Not on file  Transportation Needs: Not on file  Physical Activity: Not on file  Stress: Not on file  Social Connections: Not on file  Intimate Partner Violence: Not on file    FAMILY HISTORY: Family History  Problem Relation Age of Onset   Heart disease Maternal Uncle 81    ALLERGIES:  is allergic to shrimp [shellfish allergy], beef-derived products, fruit & vegetable daily [nutritional supplements], metformin and related, and norvasc [amlodipine].  MEDICATIONS:  Current Outpatient Medications  Medication Sig Dispense Refill   b complex vitamins capsule Take 1 capsule by mouth daily. (Patient taking differently: Take 1 capsule by mouth every morning.) 30 capsule 11   iron polysaccharides (NIFEREX) 150 MG capsule Take 1 capsule (150 mg total) by mouth daily. (Patient not taking: Reported on 06/17/2021) 30 capsule 1   B-D UF III MINI PEN NEEDLES 31G X 5 MM MISC USE TO INJECT LANTUS ONCE A DAY AND NOVOLOG TWICE DAILY. (TOTAL OF 3 INJECTIONS DAILY) 100 each 2   Cholecalciferol (VITAMIN D3) 10 MCG (400 UNIT) tablet TAKE 2 TABLETS BY MOUTH EVERY DAY (Patient taking differently: Take 800 Units by mouth every morning.) 100 tablet 8   [employer] CALCIUM 600 & VITAMIN D3 600-20 MG-MCG TABS TAKE 1 TABLET (600 MG TOTAL) BY MOUTH 2 (TWO) TIMES DAILY WITH A MEAL. (Patient taking differently: Take 1 tablet by mouth in the morning and at bedtime.) 180 tablet 1   insulin aspart (NOVOLOG FLEXPEN) 100 UNIT/ML FlexPen 8 units with breakfast and dinner (Patient taking differently: 8 Units 2 (two) times daily with a meal.) 15 mL 11   insulin glargine (LANTUS SOLOSTAR) 100 UNIT/ML Solostar Pen Inject 25 Units into the skin at bedtime.     losartan (COZAAR) 50 MG tablet Take 1 tablet (50 mg total) by mouth daily. (Patient taking differently: Take 50 mg by mouth every morning.) 90 tablet 2   No current facility-administered medications for this visit.    REVIEW OF SYSTEMS:    .10 Point review of Systems  was done is negative except as noted above.   PHYSICAL EXAMINATION: ECOG PERFORMANCE STATUS: 2  . Vitals:   06/16/21 1309  BP: (!) 175/83  Pulse: 81  Resp: 18  Temp: 99 F (37.2 C)  SpO2: 100%   Filed Weights   06/16/21 1309  Weight: 103 lb 9.6 oz (47 kg)   .Body mass index is 20.92 kg/m. Olivia Williamson GENERAL:alert, in no acute distress and comfortable SKIN: no acute rashes, no significant lesions EYES: conjunctiva are pink and non-injected, sclera anicteric OROPHARYNX: MMM, no exudates, no oropharyngeal erythema or ulceration NECK: supple, no JVD LYMPH:  no palpable lymphadenopathy in the cervical, axillary or inguinal regions LUNGS: clear to auscultation b/l with normal respiratory effort HEART: regular rate & rhythm ABDOMEN:  normoactive bowel sounds , non tender, not distended. Extremity: no pedal edema PSYCH: alert & oriented x 3 with fluent speech NEURO: no focal motor/sensory deficits  LABORATORY DATA:  I  have reviewed the data as listed  . CBC Latest Ref Rng & Units 06/16/2021 06/09/2021  WBC 4.0 - 10.5 K/uL 4.6 4.9  Hemoglobin 12.0 - 15.0 g/dL 8.5(L) 9.5(L)  Hematocrit 36.0 - 46.0 % 23.3(L) 29.1(L)  Platelets 150 - 400 K/uL 81(L) 82(LL)   CBC    Component Value Date/Time   WBC 3.1 (L) 06/17/2021 0019   RBC 3.23 (L) 06/17/2021 0019   HGB 7.9 (L) 06/17/2021 0019   HGB 8.5 (L) 06/16/2021 1212   HGB 9.5 (L) 06/09/2021 1507   HCT 22.7 (L) 06/17/2021 0019   HCT 29.1 (L) 06/09/2021 1507   PLT 73 (L) 06/17/2021 0019   PLT 81 (L) 06/16/2021 1212   PLT 82 (LL) 06/09/2021 1507   MCV 70.3 (L) 06/17/2021 0019   MCV 73 (L) 06/09/2021 1507   MCH 24.5 (L) 06/17/2021 0019   MCHC 34.8 06/17/2021 0019   RDW 22.3 (H) 06/17/2021 0019   RDW 24.6 (H) 06/09/2021 1507   LYMPHSABS 0.8 06/17/2021 0019   LYMPHSABS 0.8 06/09/2021 1507   MONOABS 0.3 06/17/2021 0019   EOSABS 0.1 06/17/2021 0019   EOSABS 0.2 06/09/2021 1507   BASOSABS 0.0 06/17/2021 0019   BASOSABS 0.0 06/09/2021  1507   Component     Latest Ref Rng & Units 06/16/2021  Sodium     135 - 145 mmol/L 122 (L)  Potassium     3.5 - 5.1 mmol/L 4.8  Chloride     98 - 111 mmol/L 90 (L)  CO2     22 - 32 mmol/L 27  Glucose     70 - 99 mg/dL 214 (H)  BUN     8 - 23 mg/dL 36 (H)  Creatinine     0.44 - 1.00 mg/dL 1.10 (H)  Calcium     8.9 - 10.3 mg/dL 9.2  Total Protein     6.5 - 8.1 g/dL 7.0  Albumin     3.5 - 5.0 g/dL 3.7  AST     15 - 41 U/L 25  ALT     0 - 44 U/L 33  Alkaline Phosphatase     38 - 126 U/L 86  Total Bilirubin     0.3 - 1.2 mg/dL 0.4  GFR, Est Non African American     >60 mL/min 53 (L)  Anion gap     5 - 15 5    Component     Latest Ref Rng & Units 05/27/2021  Iron     28 - 170 ug/dL 29  TIBC     250 - 450 ug/dL 297  Saturation Ratios     10.4 - 31.8 % 10 (L)  UIBC     148 - 442 ug/dL 268  Folate, Hemolysate     Not Estab. ng/mL 379.0  HCT     34.0 - 46.6 % 23.4 (L)  Folate, RBC     >498 ng/mL 1,620  DAT, complement      NEG  DAT, IgG      NEG . . Neita Goodnight Platelet Fraction     1.2 - 8.6 % 8.6  Transferrin Receptor     12.2 - 27.3 nmol/L 31.0 (H)  Ferritin     11 - 307 ng/mL 552 (H)  Erythropoietin     2.6 - 18.5 mIU/mL 6.1  Vitamin B12     180 - 914 pg/mL 884  Haptoglobin     42 - 346 mg/dL 72  LDH  98 - 192 U/L 152   Hemoglobinopathy evaluation Order: [id number] Status: Final result    Visible to patient: No (not released)    Next appt: 06/16/2021 at 12:15 PM in Oncology ([facility])    1 Result Note   1 Follow-up Encounter Component Ref Range & Units 4 yr ago   Hemoglobin F Quantitation 0.0 - 2.0 % 5.5 High    Hgb A 96.4 - 98.8 % 0.0 Low    HGB S 0.0 % 0.0   HGB C 0.0 % 0.0   Hemoglobin A2 Quantitation 1.8 - 3.2 % 4.8 High    HGB VARIANT 0.0 % Comment:   Comment: HGB CONSTANT SPRING 0.3%, HGB E 89.4%  HGB INTERPRETATION  Comment   Comment: Hemoglobin pattern and concentrations are consistent with a homozygous  E patient.  Suggest clinical and hematologic correlation.           Homozygous E Interpretation Ranges           Hgb F   0.0 - 15.0%           Hgb E        >80.0%           Hgb A2       > 5.0%  Hemoglobin pattern and concentrations are consistent with Hgb  Constant Spring.      Component     Latest Ref Rng & Units 05/27/2021  Iron     28 - 170 ug/dL 29  TIBC     250 - 450 ug/dL 297  Saturation Ratios     10.4 - 31.8 % 10 (L)  UIBC     148 - 442 ug/dL 268  Folate, Hemolysate     Not Estab. ng/mL 379.0  HCT     34.0 - 46.6 % 23.4 (L)  Folate, RBC     >498 ng/mL 1,620  DAT, complement      NEG  DAT, IgG      NEG . . Neita Goodnight Platelet Fraction     1.2 - 8.6 % 8.6  Transferrin Receptor     12.2 - 27.3 nmol/L 31.0 (H)  Ferritin     11 - 307 ng/mL 552 (H)  Erythropoietin     2.6 - 18.5 mIU/mL 6.1  Vitamin B12     180 - 914 pg/mL 884  Haptoglobin     42 - 346 mg/dL 72  LDH     98 - 192 U/L 152    RADIOGRAPHIC STUDIES: I have personally reviewed the radiological images as listed and agreed with the findings in the report. DG Chest 2 View  Result Date: 06/09/2021 CLINICAL DATA:  Right lower lobe pneumonia EXAM: CHEST - 2 VIEW COMPARISON:  Chest radiograph dated June 02, 2021. FINDINGS: The heart is enlarged. Atherosclerotic calcification of the aortic arch. Right basilar opacity now has a more linear appearance, likely representing atelectasis. IMPRESSION: 1. Stable cardiomegaly. 2. Right basilar linear opacity, likely atelectasis. Follow-up examination in 8-12 weeks for resolution is recommended. Electronically Signed   By: Keane Police D.O.   On: 06/09/2021 16:37   DG Chest 2 View  Result Date: 05/29/2021 CLINICAL DATA:  COVID. Hx of DM, GERD, HTN, TB. Pt is a nonsmoker. EXAM: CHEST - 2 VIEW COMPARISON:  Chest x-ray 03/16/2019, CT chest 03/16/2019 FINDINGS: The heart and mediastinal contours are unchanged. Aortic calcification. Low lung volumes. Hazy airspace opacity within the  left lower lung zone likely due to artifact  from overlying soft tissues. No pulmonary edema. No pleural effusion. No pneumothorax. No acute osseous abnormality. IMPRESSION: Low lung volumes.  No active cardiopulmonary disease. Electronically Signed   By: Iven Finn M.D.   On: 05/29/2021 15:43   CT Head Wo Contrast  Result Date: 05/28/2021 CLINICAL DATA:  Headache.  Neuro deficit. EXAM: CT HEAD WITHOUT CONTRAST TECHNIQUE: Contiguous axial images were obtained from the base of the skull through the vertex without intravenous contrast. COMPARISON:  CT head 05/11/2016 FINDINGS: Brain: No evidence of acute infarction, hemorrhage, hydrocephalus, extra-axial collection or mass lesion/mass effect. Vascular: Negative for hyperdense vessel Skull: Negative Sinuses/Orbits: Moderate mucosal edema throughout the paranasal sinuses. Bilateral cataract extraction Other: None IMPRESSION: Negative CT head Moderate sinus mucosal edema. Electronically Signed   By: Franchot Gallo M.D.   On: 05/28/2021 17:04   CT ABDOMEN PELVIS W CONTRAST  Result Date: 05/29/2021 CLINICAL DATA:  Epigastric pain. EXAM: CT ABDOMEN AND PELVIS WITH CONTRAST TECHNIQUE: Multidetector CT imaging of the abdomen and pelvis was performed using the standard protocol following bolus administration of intravenous contrast. CONTRAST:  16mL OMNIPAQUE IOHEXOL 300 MG/ML  SOLN COMPARISON:  03/16/2019. FINDINGS: Lower chest: The heart is enlarged and there is a trace pericardial effusion. There is atelectasis at the left lung base. Patchy airspace disease with associated scarring noted at the right lung base. Hepatobiliary: A few subcentimeter hypodensities are present in the liver which are unchanged from the prior exam. No biliary ductal dilatation. The gallbladder is surgically absent. Pancreas: Unremarkable. No pancreatic ductal dilatation or surrounding inflammatory changes. Spleen: Normal in size without focal abnormality. Adrenals/Urinary Tract: Adrenal  glands are unremarkable. Kidneys are normal, without renal calculi, focal lesion, or hydronephrosis. Bladder is unremarkable. Stomach/Bowel: There is a small hiatal hernia. No bowel obstruction, free air or pneumatosis. A normal appendix is seen in the right lower quadrant. No focal bowel wall thickening. Vascular/Lymphatic: Aortic atherosclerosis. No enlarged abdominal or pelvic lymph nodes. Reproductive: The uterus is in-situ. A small cystic structure is seen in the right adnexa measuring 1.7 cm. There is a small cystic structure in the left adnexa measuring 1.5 cm. Other: No free fluid. Musculoskeletal: Degenerative changes are present in the thoracolumbar spine. There are compression deformities with vertebral plana at T12 and L1, unchanged from 2020. IMPRESSION: 1. No acute intra-abdominal process. 2. Small sliding hiatal hernia. 3. Mild consolidation adjacent to scarring in the right lower lobe, possible atelectasis or infiltrate. 4. Cardiomegaly with a trace pericardial effusion. 5. Cystic structures in the adnexal regions bilaterally measuring up to 1.7 cm. Ultrasound is suggested for further characterization on follow-up. Electronically Signed   By: Brett Fairy M.D.   On: 05/29/2021 04:48   DG Chest Port 1 View  Result Date: 06/02/2021 CLINICAL DATA:  Shortness of breath EXAM: PORTABLE CHEST 1 VIEW COMPARISON:  05/29/2021 FINDINGS: Transverse diameter of heart is increased. There are no signs of pulmonary edema. Small patchy infiltrate is seen in the right lower lung fields. Rest of the lung fields are unremarkable. There is no pleural effusion or pneumothorax. IMPRESSION: Cardiomegaly. Small patchy infiltrate is seen in the right lower lung fields suggesting atelectasis/pneumonia. Electronically Signed   By: Elmer Picker M.D.   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 Her baseline hemoglobin fluctuates between about 7.5-9.5. several factors could make her anemia somewhat worse including intercurrent  infections slowing down the bone marrow, any etiology of chronic inflammation, chronic kidney disease, vitamin deficiencies especially [id number] and folic acid, iron deficiency, medication suppressing the bone marrow etc.. LDH and haptoglobin levels are normal suggesting against overt uncontrolled intravascular hemolysis at this time. -Current B12 and RBC folate levels are within normal limits but she was recommended to take B complex and folic acid 1 mg daily over-the-counter to support accelerated hematopoiesis. -Normal LDH suggest against ineffective erythropoiesis from B12 deficiency or any significant element of myelofibrosis. -Erythropoietin levels are somewhat lower than expected suggesting decreased renal responsiveness or something positive chronic inflammation. -Her ferritin levels are also elevated but iron saturation is only 10% and transferrin receptor levels are elevated suggesting functional iron deficiency. Plan -Discussed lab results today hemoglobin 8.5.  No indication for PRBC transfusion today. Discussed all her lab results and etiology of the anemia. -Discussed her severe hyponatremia sodium levels of 122.  Unclear etiology.  SIADH from recent COVID-19 lung infection versus poor p.o. intake versus other etiology. = We discussed that her sodium levels are dangerously low and we sent her to the emergency room for further evaluation and management. = Return to clinic with [place] in 4 weeks with repeat labs and an appointment for PRBC transfusion as needed.  2) chronic thrombocytopenia could be related to her hemoglobinopathy due to hypersplenism triggered by chronic analysis. Cannot rule out mild ITP. PLan -Would recommend ultrasound of the abdomen to evaluate for liver cirrhosis and splenomegaly with PCP -B complex might  address any subtle B vitamin deficiencies that might arise.  Follow-up note: Sent to the ED for severe hyponatremia, recent covid 19 infection ?adrenal insufficiency ? SIADH RTC with Dr. [name] with labs and appointment for 1 unit of PRBC in 4 weeks    All of the patients questions were answered with apparent satisfaction. The patient knows to call the clinic with any problems, questions or concerns.  Sullivan Lone MD [place] AAHIVMS [facility] Hematology/Oncology Physician [facility]

## 2021-06-24 ENCOUNTER — Ambulatory Visit: Payer: Medicare Other | Attending: Internal Medicine

## 2021-06-24 ENCOUNTER — Other Ambulatory Visit: Payer: Self-pay

## 2021-06-24 VITALS — BP 164/75 | HR 75

## 2021-06-24 DIAGNOSIS — I10 Essential (primary) hypertension: Secondary | ICD-10-CM

## 2021-06-26 ENCOUNTER — Encounter (HOSPITAL_COMMUNITY): Payer: Self-pay | Admitting: Radiology

## 2021-06-26 ENCOUNTER — Telehealth: Payer: Self-pay

## 2021-06-26 NOTE — Telephone Encounter (Signed)
Will let pt know on the next visit 07/15/2021 so she will have an translator to help tell her.

## 2021-06-26 NOTE — Telephone Encounter (Signed)
-----   Message from Claiborne Rigg, NP sent at 06/09/2021  4:48 PM EST ----- F/U cxr in 3 months. Does not show any worsening PNA at this time.

## 2021-07-11 ENCOUNTER — Other Ambulatory Visit: Payer: Self-pay | Admitting: *Deleted

## 2021-07-11 DIAGNOSIS — D649 Anemia, unspecified: Secondary | ICD-10-CM

## 2021-07-11 DIAGNOSIS — D696 Thrombocytopenia, unspecified: Secondary | ICD-10-CM

## 2021-07-14 ENCOUNTER — Inpatient Hospital Stay: Payer: Medicare Other

## 2021-07-14 ENCOUNTER — Inpatient Hospital Stay: Payer: Medicare Other | Attending: Hematology | Admitting: Hematology

## 2021-07-15 ENCOUNTER — Ambulatory Visit: Payer: Medicare Other | Attending: Internal Medicine | Admitting: Internal Medicine

## 2021-07-15 ENCOUNTER — Encounter: Payer: Self-pay | Admitting: Internal Medicine

## 2021-07-15 ENCOUNTER — Other Ambulatory Visit: Payer: Self-pay

## 2021-07-15 VITALS — BP 160/81 | HR 77 | Resp 16 | Wt 106.8 lb

## 2021-07-15 DIAGNOSIS — Z09 Encounter for follow-up examination after completed treatment for conditions other than malignant neoplasm: Secondary | ICD-10-CM | POA: Diagnosis not present

## 2021-07-15 DIAGNOSIS — Z79899 Other long term (current) drug therapy: Secondary | ICD-10-CM | POA: Diagnosis not present

## 2021-07-15 DIAGNOSIS — I10 Essential (primary) hypertension: Secondary | ICD-10-CM

## 2021-07-15 DIAGNOSIS — E1140 Type 2 diabetes mellitus with diabetic neuropathy, unspecified: Secondary | ICD-10-CM | POA: Insufficient documentation

## 2021-07-15 DIAGNOSIS — D649 Anemia, unspecified: Secondary | ICD-10-CM

## 2021-07-15 DIAGNOSIS — E871 Hypo-osmolality and hyponatremia: Secondary | ICD-10-CM | POA: Diagnosis not present

## 2021-07-15 DIAGNOSIS — M5416 Radiculopathy, lumbar region: Secondary | ICD-10-CM | POA: Diagnosis not present

## 2021-07-15 DIAGNOSIS — I1310 Hypertensive heart and chronic kidney disease without heart failure, with stage 1 through stage 4 chronic kidney disease, or unspecified chronic kidney disease: Secondary | ICD-10-CM | POA: Insufficient documentation

## 2021-07-15 DIAGNOSIS — E1122 Type 2 diabetes mellitus with diabetic chronic kidney disease: Secondary | ICD-10-CM | POA: Insufficient documentation

## 2021-07-15 DIAGNOSIS — N83201 Unspecified ovarian cyst, right side: Secondary | ICD-10-CM | POA: Insufficient documentation

## 2021-07-15 DIAGNOSIS — E1169 Type 2 diabetes mellitus with other specified complication: Secondary | ICD-10-CM | POA: Insufficient documentation

## 2021-07-15 DIAGNOSIS — Z28310 Unvaccinated for covid-19: Secondary | ICD-10-CM | POA: Diagnosis not present

## 2021-07-15 DIAGNOSIS — E1159 Type 2 diabetes mellitus with other circulatory complications: Secondary | ICD-10-CM

## 2021-07-15 DIAGNOSIS — E222 Syndrome of inappropriate secretion of antidiuretic hormone: Secondary | ICD-10-CM | POA: Insufficient documentation

## 2021-07-15 DIAGNOSIS — R600 Localized edema: Secondary | ICD-10-CM | POA: Diagnosis not present

## 2021-07-15 DIAGNOSIS — N1831 Chronic kidney disease, stage 3a: Secondary | ICD-10-CM | POA: Diagnosis not present

## 2021-07-15 DIAGNOSIS — Z8616 Personal history of COVID-19: Secondary | ICD-10-CM | POA: Insufficient documentation

## 2021-07-15 DIAGNOSIS — I152 Hypertension secondary to endocrine disorders: Secondary | ICD-10-CM

## 2021-07-15 DIAGNOSIS — R0989 Other specified symptoms and signs involving the circulatory and respiratory systems: Secondary | ICD-10-CM

## 2021-07-15 DIAGNOSIS — Z794 Long term (current) use of insulin: Secondary | ICD-10-CM | POA: Diagnosis not present

## 2021-07-15 DIAGNOSIS — E1165 Type 2 diabetes mellitus with hyperglycemia: Secondary | ICD-10-CM

## 2021-07-15 DIAGNOSIS — N83202 Unspecified ovarian cyst, left side: Secondary | ICD-10-CM | POA: Insufficient documentation

## 2021-07-15 DIAGNOSIS — E1142 Type 2 diabetes mellitus with diabetic polyneuropathy: Secondary | ICD-10-CM | POA: Diagnosis not present

## 2021-07-15 DIAGNOSIS — D631 Anemia in chronic kidney disease: Secondary | ICD-10-CM | POA: Insufficient documentation

## 2021-07-15 DIAGNOSIS — I517 Cardiomegaly: Secondary | ICD-10-CM | POA: Diagnosis not present

## 2021-07-15 DIAGNOSIS — N949 Unspecified condition associated with female genital organs and menstrual cycle: Secondary | ICD-10-CM

## 2021-07-15 MED ORDER — SPIRONOLACTONE 25 MG PO TABS: 12.5000 mg | ORAL_TABLET | Freq: Every day | ORAL | 1 refills | Status: DC

## 2021-07-15 MED ORDER — NOVOLOG FLEXPEN 100 UNIT/ML ~~LOC~~ SOPN: PEN_INJECTOR | SUBCUTANEOUS | 11 refills | Status: DC

## 2021-07-15 MED ORDER — LANTUS SOLOSTAR 100 UNIT/ML ~~LOC~~ SOPN: 25.0000 [IU] | PEN_INJECTOR | Freq: Every day | SUBCUTANEOUS | 6 refills | Status: DC

## 2021-07-15 MED ORDER — LOSARTAN POTASSIUM 50 MG PO TABS: 50.0000 mg | ORAL_TABLET | Freq: Every day | ORAL | 2 refills | Status: DC

## 2021-07-15 NOTE — Progress Notes (Signed)
Patient ID: Olivia Williamson, female    DOB: 05-Oct-1945  MRN: [id number]  CC:  hosp f/u  Subjective: Olivia Williamson is a 76 y.o. female who presents for hosp f/u.  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 MF, osteoporosis, lumbar radiculopathy, COVID pneumonia 05/2021  Patient presents for hospital follow-up. She was hospitalized early part of last month for dehydration with hypoNa, abdominal pain, pancytopenia (transfused 2 units PRBC).  Found to have COVID and was treated with Paxlovid.  CAT scan of the abdomen revealed cystic lesion on both ovaries.  Patient was seen in follow-up by nurse practitioner.  Referred to gynecology.  Has appointment with the gynecologist in April.  Since then she was hospitalized 1/23-24/2023 with hyponatremia with sodium of 122.  Thought to be due to poor oral intake.  Treated with normal saline with improvement of sodium to 130 by the time of discharge.  She was noted to have chronic anemia and thrombocytopenia.  Today: Patient has bottles of calcium, vitamin D and Cozaar with her. She reports that she is drinking and eating normally.  No dizziness.  She has upcoming appointment with the gynecologist in April of this year for the cystic lesions Seen in adnexal regions bilaterally measuring up to 1.7 cm.  On review of her studies, patient noted to have cardiomegaly on x-ray.  Last echo was done in 2017 which revealed EF of 70% with grade 1 diastolic dysfunction.  She has lower extremity edema.  She denies any chest pains or shortness of breath.  DM:  Lab Results  Component Value Date   HGBA1C 5.8 (H) 05/30/2021   She has a log of blood sugar readings with her.  Family has been checking blood sugars in the  mornings and in the evenings after dinner.  Morning blood sugar readings have ranged from 83-189.  Blood sugars after dinner has ranged 115-185 with the highest reading being 234. She confirms taking 2 types of insulin but does not recall the doses.  She is supposed to be on Lantus 25 units once a day and NovoLog 8 units with breakfast and dinner.  HTN: He has a log of blood pressure readings but only the systolic blood pressure.  Range is 120-164.  She is taking Cozaar 25 mg 2 tablets daily.  She took it already for today. 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 (Patient taking differently: Take 1 capsule by mouth every morning.) 30 capsule 11   B-D UF III MINI PEN NEEDLES 31G X 5 MM MISC USE TO INJECT LANTUS ONCE A DAY AND NOVOLOG TWICE DAILY. (TOTAL OF 3 INJECTIONS DAILY) 100 each 2   Cholecalciferol (VITAMIN D3) 10 MCG (400 UNIT) tablet TAKE 2 TABLETS BY MOUTH EVERY DAY  (Patient taking differently: Take 800 Units by mouth every morning.) 100 tablet 8   [employer] CALCIUM 600 & VITAMIN D3 600-20 MG-MCG TABS TAKE 1 TABLET (600 MG TOTAL) BY MOUTH 2 (TWO) TIMES DAILY WITH A MEAL. (Patient taking differently: Take 1 tablet by mouth in the morning and at bedtime.) 180 tablet 1   insulin aspart (NOVOLOG FLEXPEN) 100 UNIT/ML FlexPen 8 units with breakfast and dinner (Patient taking differently: 8 Units 2 (two) times daily with a meal.) 15 mL 11   insulin glargine (LANTUS SOLOSTAR) 100 UNIT/ML Solostar Pen Inject 25 Units into the skin at bedtime.     iron polysaccharides (NIFEREX) 150 MG capsule Take 1 capsule (150 mg total) by mouth daily. (Patient not taking: Reported on 06/17/2021) 30 capsule 1   losartan (COZAAR) 50 MG tablet Take 1 tablet (50 mg total) by mouth daily. (Patient taking differently: Take 50 mg by mouth every morning.) 90 tablet 2   No current facility-administered medications on file prior to visit.    Allergies  Allergen Reactions   Shrimp [Shellfish Allergy] Shortness Of Breath and Other (See Comments)    Sneezing, also   Beef-Derived Products Other (See Comments)    Generalized "burning sensation"   Fruit & Vegetable Daily [Nutritional Supplements] Other (See Comments)    Oranges cause lower extremity burning   Metformin And Related Diarrhea   Norvasc [Amlodipine] Swelling and Other (See Comments)    Edema of the lower extremities    Social History   Socioeconomic History   Marital status: Widowed    Spouse name: Not on file   Number of children: Not on file   Years of education: Not on file   Highest education level: Not on file  Occupational History   Occupation: retired  Tobacco Use   Smoking status: Never   Smokeless tobacco: Never  Substance and Sexual Activity   Alcohol use: No   Drug use: No   Sexual activity: Not on file  Other Topics Concern   Not on file  Social History Narrative   Not on file   Social Determinants of  Health   Financial Resource Strain: Not on file  Food Insecurity: Not on file  Transportation Needs: Not on file  Physical Activity: Not on file  Stress: Not on file  Social Connections: Not on file  Intimate Partner Violence: Not on file    Family History  Problem Relation Age of Onset   Heart disease Maternal Uncle 79    Past Surgical History:  Procedure Laterality Date   ABDOMINAL HYSTERECTOMY     CHOLECYSTECTOMY N/A 05/02/2014   Procedure: LAPAROSCOPIC CHOLECYSTECTOMY ;  Surgeon: Coralie Keens, MD;  Location: [facility];  Service: General;  Laterality: N/A;  laparoscopic cholecystectomy   COLONOSCOPY     VIDEO BRONCHOSCOPY Bilateral 01/10/2016   Procedure: VIDEO BRONCHOSCOPY WITHOUT FLUORO;  Surgeon: Collene Gobble, MD;  Location: [facility];  Service: Cardiopulmonary;  Laterality: Bilateral;    ROS: Review of Systems Negative except as  stated above  PHYSICAL EXAM: BP (!) 213/89    Pulse 77    Resp 16    Wt 106 lb 12.8 oz (48.4 kg)    LMP 05/11/2016    SpO2 99%    BMI 21.57 kg/m   Wt Readings from Last 3 Encounters:  07/15/21 106 lb 12.8 oz (48.4 kg)  06/16/21 103 lb (46.7 kg)  06/16/21 103 lb 9.6 oz (47 kg)    Physical Exam  General appearance -elderly frail female in NAD. Mental status -patient is forgetful. Mouth -oral mucosa is moist. Neck - supple, no significant adenopathy Chest -she has crackles heard at both bases. Heart - normal rate, regular rhythm, normal S1, S2, no murmurs, rubs, clicks or gallops.  Positive JVD. Extremities -1+ bilateral lower extremity edema.  Lab Results  Component Value Date   HGBA1C 5.8 (H) 05/30/2021   Lab Results  Component Value Date   IRON 74 05/30/2021   TIBC 298 05/30/2021   FERRITIN 423 (H) 06/01/2021    CMP Latest Ref Rng & Units 06/17/2021 06/17/2021 06/16/2021  Glucose 70 - 99 mg/dL 104(H) 239(H) 113(H)  BUN 8 - 23 mg/dL 26(H) 31(H) 34(H)  Creatinine 0.44 - 1.00 mg/dL 0.75 0.85 0.81  Sodium 135 - 145 mmol/L  130(L) 128(L) 127(L)  Potassium 3.5 - 5.1 mmol/L 4.1 4.2 4.6  Chloride 98 - 111 mmol/L 101 99 96(L)  CO2 22 - 32 mmol/L 24 23 25   Calcium 8.9 - 10.3 mg/dL 8.7(L) 8.7(L) 9.3  Total Protein 6.5 - 8.1 g/dL - - -  Total Bilirubin 0.3 - 1.2 mg/dL - - -  Alkaline Phos 38 - 126 U/L - - -  AST 15 - 41 U/L - - -  ALT 0 - 44 U/L - - -   Lipid Panel     Component Value Date/Time   CHOL 133 10/29/2020 1621   TRIG 160 (H) 10/29/2020 1621   HDL 43 10/29/2020 1621   CHOLHDL 3.1 10/29/2020 1621   CHOLHDL 2.7 03/18/2019 0358   VLDL 13 03/18/2019 0358   LDLCALC 63 10/29/2020 1621    CBC    Component Value Date/Time   WBC 3.1 (L) 06/17/2021 0019   RBC 3.23 (L) 06/17/2021 0019   HGB 7.9 (L) 06/17/2021 0019   HGB 8.5 (L) 06/16/2021 1212   HGB 9.5 (L) 06/09/2021 1507   HCT 22.7 (L) 06/17/2021 0019   HCT 29.1 (L) 06/09/2021 1507   PLT 73 (L) 06/17/2021 0019   PLT 81 (L) 06/16/2021 1212   PLT 82 (LL) 06/09/2021 1507   MCV 70.3 (L) 06/17/2021 0019   MCV 73 (L) 06/09/2021 1507   MCH 24.5 (L) 06/17/2021 0019   MCHC 34.8 06/17/2021 0019   RDW 22.3 (H) 06/17/2021 0019   RDW 24.6 (H) 06/09/2021 1507   LYMPHSABS 0.8 06/17/2021 0019   LYMPHSABS 0.8 06/09/2021 1507   MONOABS 0.3 06/17/2021 0019   EOSABS 0.1 06/17/2021 0019   EOSABS 0.2 06/09/2021 1507   BASOSABS 0.0 06/17/2021 0019   BASOSABS 0.0 06/09/2021 1507    ASSESSMENT AND PLAN: 1. Hospital discharge follow-up   2. Hyponatremia Patient was diagnosed with possible SIADH on hospitalization back in 02/2019.  We will reassess with blood test.  We will try to fluid restrict. - Basic Metabolic Panel - Osmolality, urine - Osmolality  3. Hypertension associated with diabetes ([place]) Not at goal.  Add low-dose spironolactone. - losartan (COZAAR) 50 MG tablet; Take 1 tablet (50 mg total) by mouth daily.  Dispense: 90 tablet; Refill: 2  4. Cardiomegaly - Brain natriuretic peptide - ECHOCARDIOGRAM COMPLETE; Future  5. Adnexal  cyst Patient to keep follow-up appointment with gynecology in April.  6. Type 2 diabetes mellitus with diabetic polyneuropathy, with long-term current use of insulin (HCC) No changes made in insulin doses at this time.  Continue to monitor blood sugars. - insulin glargine (LANTUS SOLOSTAR) 100 UNIT/ML Solostar Pen; Inject 25 Units into the skin at bedtime.  Dispense: 15 mL; Refill: 6 - insulin aspart (NOVOLOG FLEXPEN) 100 UNIT/ML FlexPen; 8 units with breakfast and dinner  Dispense: 15 mL; Refill: 11  7. Edema of both legs Advised salt restriction. - Brain natriuretic peptide - DG Chest 2 View; Future - spironolactone (ALDACTONE) 25 MG tablet; Take 0.5 tablets (12.5 mg total) by mouth daily.  Dispense: 30 tablet; Refill: 1  8. Chronic anemia Followed by Dr. [name]  9. Lung crackles - DG Chest 2 View; Future     Patient was given the opportunity to ask questions.  Patient verbalized understanding of the plan and was able to repeat key elements of the plan.   No orders of the defined types were placed in this encounter.    Requested Prescriptions    No prescriptions requested or ordered in this encounter    No follow-ups on file.  Karle Plumber, MD, FACP

## 2021-07-17 ENCOUNTER — Other Ambulatory Visit: Payer: Self-pay

## 2021-07-17 ENCOUNTER — Ambulatory Visit (HOSPITAL_COMMUNITY)
Admission: RE | Admit: 2021-07-17 | Discharge: 2021-07-17 | Disposition: A | Discharge status: Home / Self Care | Payer: Medicare Other | Source: Ambulatory Visit | Attending: Internal Medicine | Admitting: Internal Medicine

## 2021-07-17 DIAGNOSIS — I700 Atherosclerosis of aorta: Secondary | ICD-10-CM | POA: Insufficient documentation

## 2021-07-17 DIAGNOSIS — I119 Hypertensive heart disease without heart failure: Secondary | ICD-10-CM | POA: Diagnosis not present

## 2021-07-17 DIAGNOSIS — I358 Other nonrheumatic aortic valve disorders: Secondary | ICD-10-CM | POA: Diagnosis not present

## 2021-07-17 DIAGNOSIS — E119 Type 2 diabetes mellitus without complications: Secondary | ICD-10-CM | POA: Insufficient documentation

## 2021-07-17 DIAGNOSIS — I517 Cardiomegaly: Secondary | ICD-10-CM | POA: Diagnosis present

## 2021-07-17 LAB — BASIC METABOLIC PANEL
BUN/Creatinine Ratio: 38 — ABNORMAL HIGH (ref 12–28)
BUN: 42 mg/dL — ABNORMAL HIGH (ref 8–27)
CO2: 23 mmol/L (ref 20–29)
Calcium: 9.8 mg/dL (ref 8.7–10.3)
Chloride: 100 mmol/L (ref 96–106)
Creatinine, Ser: 1.1 mg/dL — ABNORMAL HIGH (ref 0.57–1.00)
Glucose: 137 mg/dL — ABNORMAL HIGH (ref 70–99)
Potassium: 5 mmol/L (ref 3.5–5.2)
Sodium: 135 mmol/L (ref 134–144)
eGFR: 53 mL/min/{1.73_m2} — ABNORMAL LOW (ref >59)

## 2021-07-17 LAB — ECHOCARDIOGRAM COMPLETE
AR max vel: 2.57 cm2
AV Peak grad: 6.6 mmHg
Ao pk vel: 1.29 m/s
Area-P 1/2: 3.51 cm2
S' Lateral: 2.4 cm

## 2021-07-17 LAB — OSMOLALITY: Osmolality Meas: 286 mOsmol/kg (ref 280–301)

## 2021-07-17 LAB — OSMOLALITY, URINE: Osmolality, Ur: 345 mOsmol/kg

## 2021-07-17 LAB — BRAIN NATRIURETIC PEPTIDE: BNP: 69.5 pg/mL (ref 0.0–100.0)

## 2021-07-17 NOTE — Progress Notes (Signed)
Echocardiogram 2D Echocardiogram has been performed.  Olivia Williamson 07/17/2021, 8:41 AM

## 2021-07-18 ENCOUNTER — Encounter: Payer: Self-pay | Admitting: Internal Medicine

## 2021-07-18 ENCOUNTER — Telehealth: Payer: Self-pay

## 2021-07-18 NOTE — Progress Notes (Signed)
Let patient and her son know that her sodium level has normalized.  Kidney function slightly decreased.  Make sure she is drinking adequate fluids during the day.  After being on the new blood pressure medication that I added on last visit called spironolactone for 1 to 2 weeks, please return to the lab to have sodium level rechecked.

## 2021-07-18 NOTE — Telephone Encounter (Signed)
Contacted pt to go over lab results pt didn't answer lvm   If pt family member calls back please give them lab results   Sent a CRM and forward labs to NT to give pt labs when they call back

## 2021-07-29 ENCOUNTER — Inpatient Hospital Stay: Payer: Medicare Other | Attending: Hematology

## 2021-07-29 ENCOUNTER — Inpatient Hospital Stay: Payer: Medicare Other | Admitting: Hematology

## 2021-07-30 ENCOUNTER — Other Ambulatory Visit: Payer: Self-pay | Admitting: Internal Medicine

## 2021-07-30 DIAGNOSIS — Z87310 Personal history of (healed) osteoporosis fracture: Secondary | ICD-10-CM

## 2021-09-11 ENCOUNTER — Ambulatory Visit (INDEPENDENT_AMBULATORY_CARE_PROVIDER_SITE_OTHER): Payer: Medicare Other | Admitting: Obstetrics & Gynecology

## 2021-09-11 ENCOUNTER — Encounter: Payer: Self-pay | Admitting: Obstetrics & Gynecology

## 2021-09-11 VITALS — BP 126/67 | HR 80

## 2021-09-11 DIAGNOSIS — N958 Other specified menopausal and perimenopausal disorders: Secondary | ICD-10-CM | POA: Diagnosis not present

## 2021-09-11 DIAGNOSIS — Z1231 Encounter for screening mammogram for malignant neoplasm of breast: Secondary | ICD-10-CM

## 2021-09-11 DIAGNOSIS — N9489 Other specified conditions associated with female genital organs and menstrual cycle: Secondary | ICD-10-CM | POA: Diagnosis not present

## 2021-09-11 NOTE — Progress Notes (Signed)
Patient ID: Olivia Williamson, female   DOB: 06-14-1945, 76 y.o.   MRN: [id number] ? ?Chief Complaint  ?Patient presents with  ? Gynecologic Exam  ? ? ?HPI ?Olivia Williamson is a 76 y.o. female.  No obstetric history on file. ?CT was done 05/29/2021 for epigastric pain and bilateral adnexal cysts < 2cm were seen and she was sent for f/u.  ?HPI ? ?Past Medical History:  ?Diagnosis Date  ? Arthritis   ? Diabetes mellitus without complication ([place])   ? stopped DM meds 2004  ? GERD (gastroesophageal reflux disease)   ? Hypertension   ? meds stopped 1 week ago , need to get renewed  ? Tuberculosis   ? ACTIVE  ? ? ?Past Surgical History:  ?Procedure Laterality Date  ? ABDOMINAL HYSTERECTOMY    ? CHOLECYSTECTOMY N/A 05/02/2014  ? Procedure: LAPAROSCOPIC CHOLECYSTECTOMY ;  Surgeon: Coralie Keens, MD;  Location: [facility];  Service: General;  Laterality: N/A;  laparoscopic cholecystectomy  ? COLONOSCOPY    ? VIDEO BRONCHOSCOPY Bilateral 01/10/2016  ? Procedure: VIDEO BRONCHOSCOPY WITHOUT FLUORO;  Surgeon: Collene Gobble, MD;  Location: [facility];  Service: Cardiopulmonary;  Laterality: Bilateral;  ? ? ?Family History  ?Problem Relation Age of Onset  ? Heart disease Maternal Uncle 50  ? ? ?Social History ?Social History  ? ?Tobacco Use  ? Smoking status: Never  ? Smokeless tobacco: Never  ?Substance Use Topics  ? Alcohol use: No  ? Drug use: No  ? ? ?Allergies  ?Allergen Reactions  ? Shrimp [Shellfish Allergy] Shortness Of Breath and Other (See Comments)  ?  Sneezing, also  ? Beef-Derived Products Other (See Comments)  ?  Generalized "burning sensation"  ? Fruit & Vegetable Daily [Nutritional Supplements] Other (See Comments)  ?  Oranges cause lower extremity burning  ? Metformin And Related Diarrhea  ? Norvasc [Amlodipine] Swelling and Other (See Comments)  ?  Edema of the lower extremities  ? ? ?Current Outpatient Medications  ?Medication Sig Dispense Refill  ? b complex vitamins capsule Take 1 capsule by mouth daily. (Patient taking  differently: Take 1 capsule by mouth every morning.) 30 capsule 11  ? B-D UF III MINI PEN NEEDLES 31G X 5 MM MISC USE TO INJECT LANTUS ONCE A DAY AND NOVOLOG TWICE DAILY. (TOTAL OF 3 INJECTIONS DAILY) 100 each 2  ? Cholecalciferol (VITAMIN D3) 10 MCG (400 UNIT) tablet TAKE 2 TABLETS BY MOUTH EVERY DAY (Patient taking differently: Take 800 Units by mouth every morning.) 100 tablet 8  ? CVS CALCIUM 600 & VITAMIN D3 600-20 MG-MCG TABS TAKE 1 TABLET (600 MG TOTAL) BY MOUTH 2 (TWO) TIMES DAILY WITH A MEAL. (Patient taking differently: Take 1 tablet by mouth in the morning and at bedtime.) 180 tablet 1  ? insulin aspart (NOVOLOG FLEXPEN) 100 UNIT/ML FlexPen 8 units with breakfast and dinner 15 mL 11  ? insulin glargine (LANTUS SOLOSTAR) 100 UNIT/ML Solostar Pen Inject 25 Units into the skin at bedtime. 15 mL 6  ? losartan (COZAAR) 50 MG tablet Take 1 tablet (50 mg total) by mouth daily. 90 tablet 2  ? iron polysaccharides (NIFEREX) 150 MG capsule Take 1 capsule (150 mg total) by mouth daily. (Patient not taking: Reported on 06/17/2021) 30 capsule 1  ? spironolactone (ALDACTONE) 25 MG tablet Take 0.5 tablets (12.5 mg total) by mouth daily. 30 tablet 1  ? ?No current facility-administered medications for this visit.  ? ? ?Review of Systems ?Review of Systems  ?Constitutional: Negative.   ?  Genitourinary:  Negative for pelvic pain, vaginal bleeding and vaginal discharge.  ? ?Blood pressure 126/67, pulse 80, last menstrual period 05/11/2016. ? ?Physical Exam ?Physical Exam ?Vitals and nursing note reviewed.  ?Constitutional:   ?   Appearance: Ill appearance: may be underweight.  ?HENT:  ?   Head: Normocephalic and atraumatic.  ?Pulmonary:  ?   Effort: Pulmonary effort is normal.  ?Neurological:  ?   Mental Status: She is alert.  ?Psychiatric:     ?   Mood and Affect: Mood normal.     ?   Behavior: Behavior normal.  ? ? ?Data Reviewed ? ?Narrative & Impression  ?CLINICAL DATA:  Epigastric pain. ?  ?EXAM: ?CT ABDOMEN AND PELVIS  WITH CONTRAST ?  ?TECHNIQUE: ?Multidetector CT imaging of the abdomen and pelvis was performed ?using the standard protocol following bolus administration of ?intravenous contrast. ?  ?CONTRAST:  67mL OMNIPAQUE IOHEXOL 300 MG/ML  SOLN ?  ?COMPARISON:  03/16/2019. ?  ?FINDINGS: ?Lower chest: The heart is enlarged and there is a trace pericardial ?effusion. There is atelectasis at the left lung base. Patchy ?airspace disease with associated scarring noted at the right lung ?base. ?  ?Hepatobiliary: A few subcentimeter hypodensities are present in the ?liver which are unchanged from the prior exam. No biliary ductal ?dilatation. The gallbladder is surgically absent. ?  ?Pancreas: Unremarkable. No pancreatic ductal dilatation or ?surrounding inflammatory changes. ?  ?Spleen: Normal in size without focal abnormality. ?  ?Adrenals/Urinary Tract: Adrenal glands are unremarkable. Kidneys are ?normal, without renal calculi, focal lesion, or hydronephrosis. ?Bladder is unremarkable. ?  ?Stomach/Bowel: There is a small hiatal hernia. No bowel obstruction, ?free air or pneumatosis. A normal appendix is seen in the right ?lower quadrant. No focal bowel wall thickening. ?  ?Vascular/Lymphatic: Aortic atherosclerosis. No enlarged abdominal or ?pelvic lymph nodes. ?  ?Reproductive: The uterus is in-situ. A small cystic structure is ?seen in the right adnexa measuring 1.7 cm. There is a small cystic ?structure in the left adnexa measuring 1.5 cm. ?  ?Other: No free fluid. ?  ?Musculoskeletal: Degenerative changes are present in the ?thoracolumbar spine. There are compression deformities with ?vertebral plana at T12 and L1, unchanged from 2020. ?  ?IMPRESSION: ?1. No acute intra-abdominal process. ?2. Small sliding hiatal hernia. ?3. Mild consolidation adjacent to scarring in the right lower lobe, ?possible atelectasis or infiltrate. ?4. Cardiomegaly with a trace pericardial effusion. ?5. Cystic structures in the adnexal regions  bilaterally measuring up ?to 1.7 cm. Ultrasound is suggested for further characterization on ?follow-up. ?  ?  ?Electronically Signed ?  By: Brett Fairy M.D. ?  On: 05/29/2021 04:48 ?   ? ?Assessment ?Simple adnexal cysts <2cm, [place] f/u recommended, should be benign ? ?Plan ?If benign findings confirmed by [place] no further f/u is needed ? ? ? ?Emeterio Reeve ?09/11/2021, 10:50 AM ? ? ? ?

## 2021-09-11 NOTE — Progress Notes (Signed)
Patients pelvic ultrasound scheduled for next Friday April 28, 23. Time and location is 4:30pm at [facility]. Address to hospital was provided along with appointment time.    ? ?Appointment was explained to both patient and her son using interpreter named Jamelle Haring. Patient verbalized understanding. ? ?I explained and offered patient to get mammogram done today on mobile mammogram bus that is here at our office today and she declined.  ? ? ? ?Ramla Hase, CMA  ?

## 2021-09-17 ENCOUNTER — Telehealth: Payer: Self-pay | Admitting: Internal Medicine

## 2021-09-17 ENCOUNTER — Other Ambulatory Visit (HOSPITAL_COMMUNITY): Payer: Medicare Other

## 2021-09-17 MED ORDER — ALENDRONATE SODIUM 70 MG PO TABS: 70.0000 mg | ORAL_TABLET | ORAL | 5 refills | Status: DC

## 2021-09-17 NOTE — Telephone Encounter (Signed)
I received results of patient's bone density study done earlier this month through [facility] endocrinology by Dr. Leotis Shames.  This was ordered after patient was hospitalized with recent admission for hip fracture.  Patient was supposed to follow-up with them but did not. ?Bone density at the left total hip was -3.7 with a Z score of -2 ?Bone density of the right proximal radius revealed T score of -3.6 and Z score of -1.2. ?I will have my nurse call the patient and her daughter or son to let them know the results of this.  Would recommend that she continues calcium and vitamin D supplement twice a day as prescribed.  We will also put her on Fosamax 70 mg once a week with instructions of how to take the medication. ?

## 2021-09-18 NOTE — Telephone Encounter (Signed)
Left message on voicemail to return call.

## 2021-09-19 ENCOUNTER — Ambulatory Visit (HOSPITAL_COMMUNITY): Admission: RE | Admit: 2021-09-19 | Payer: Medicare Other | Source: Ambulatory Visit

## 2021-09-19 NOTE — Telephone Encounter (Signed)
Attempt to call patient daughter and son in law.  ? ?Left message on voicemail per DPR. Encourage to call if they have additional questions or concerns.  ? ?

## 2021-09-23 NOTE — Telephone Encounter (Signed)
Left message on voicemail to return call.  ° °Left message on voicemail per DPR. Encourage to call if they have additional questions or concerns.  ° ° °

## 2021-10-10 ENCOUNTER — Other Ambulatory Visit: Payer: Self-pay | Admitting: Internal Medicine

## 2021-10-10 DIAGNOSIS — R600 Localized edema: Secondary | ICD-10-CM

## 2021-10-10 NOTE — Telephone Encounter (Signed)
Requested Prescriptions  Pending Prescriptions Disp Refills  . spironolactone (ALDACTONE) 25 MG tablet [Pharmacy Med Name: SPIRONOLACTONE 25 MG TABLET] 45 tablet 1    Sig: TAKE 1/2 TABLET BY MOUTH EVERY DAY     Cardiovascular: Diuretics - Aldosterone Antagonist Failed - 10/10/2021  2:17 AM      Failed - Cr in normal range and within 180 days    Creatinine  Date Value Ref Range Status  06/16/2021 1.10 (H) 0.44 - 1.00 mg/dL Final   Creat  Date Value Ref Range Status  06/19/2016 0.66 0.50 - 0.99 mg/dL Final    Comment:      For patients > or = 74 years of age: The upper reference limit for Creatinine is approximately 13% higher for people identified as African-American.      Creatinine, Ser  Date Value Ref Range Status  07/15/2021 1.10 (H) 0.57 - 1.00 mg/dL Final   Creatinine, Urine  Date Value Ref Range Status  06/01/2021 194.16 mg/dL Final    Comment:    Performed at [facility], [place] [address], [place], [place] [address]         Passed - K in normal range and within 180 days    Potassium  Date Value Ref Range Status  07/15/2021 5.0 3.5 - 5.2 mmol/L Final         Passed - Na in normal range and within 180 days    Sodium  Date Value Ref Range Status  07/15/2021 135 134 - 144 mmol/L Final         Passed - eGFR is 30 or above and within 180 days    GFR, Est African American  Date Value Ref Range Status  12/30/2015 >89 >=60 mL/min Final   GFR calc Af Amer  Date Value Ref Range Status  08/31/2019 61 >59 mL/min/1.73 Final   GFR, Est Non African American  Date Value Ref Range Status  12/30/2015 >89 >=60 mL/min Final   GFR, Estimated  Date Value Ref Range Status  06/17/2021 >60 >60 mL/min Final    Comment:    (NOTE) Calculated using the CKD-EPI Creatinine Equation (2021)   06/16/2021 53 (L) >60 mL/min Final    Comment:    (NOTE) Calculated using the CKD-EPI Creatinine Equation (2021)    eGFR  Date Value Ref Range Status  07/15/2021 53 (L) >59  mL/min/1.73 Final         Passed - Last BP in normal range    BP Readings from Last 1 Encounters:  09/11/21 126/67         Passed - Valid encounter within last 6 months    Recent Outpatient Visits          2 months ago Hospital discharge follow-up   [place], Deborah B, MD   4 months ago Hospital discharge follow-up   [place] Gildardo Pounds, NP   4 months ago Essential hypertension   [place], Annie Main L, RPH-CPP   5 months ago Type 2 diabetes mellitus with hyperglycemia, with long-term current use of insulin [facility])   [place] Karle Plumber B, MD   7 months ago Type 2 diabetes mellitus with hyperglycemia, with long-term current use of insulin [facility])   [place], Deborah B, MD      Future Appointments  In 1 month [name], MD [place]

## 2021-12-01 ENCOUNTER — Ambulatory Visit (HOSPITAL_BASED_OUTPATIENT_CLINIC_OR_DEPARTMENT_OTHER): Payer: Medicare Other | Admitting: Internal Medicine

## 2021-12-01 ENCOUNTER — Emergency Department (HOSPITAL_COMMUNITY): Payer: Medicare Other

## 2021-12-01 ENCOUNTER — Encounter: Payer: Self-pay | Admitting: Internal Medicine

## 2021-12-01 ENCOUNTER — Other Ambulatory Visit: Payer: Self-pay

## 2021-12-01 ENCOUNTER — Emergency Department (HOSPITAL_COMMUNITY)
Admission: EM | Admit: 2021-12-01 | Discharge: 2021-12-02 | Disposition: A | Discharge status: Home / Self Care | Payer: Medicare Other | Attending: Emergency Medicine | Admitting: Emergency Medicine

## 2021-12-01 ENCOUNTER — Encounter (HOSPITAL_COMMUNITY): Payer: Self-pay

## 2021-12-01 VITALS — BP 202/80 | HR 69 | Ht 59.0 in

## 2021-12-01 DIAGNOSIS — S0033XA Contusion of nose, initial encounter: Secondary | ICD-10-CM | POA: Diagnosis not present

## 2021-12-01 DIAGNOSIS — G44301 Post-traumatic headache, unspecified, intractable: Secondary | ICD-10-CM | POA: Insufficient documentation

## 2021-12-01 DIAGNOSIS — W19XXXD Unspecified fall, subsequent encounter: Secondary | ICD-10-CM | POA: Insufficient documentation

## 2021-12-01 DIAGNOSIS — Z79899 Other long term (current) drug therapy: Secondary | ICD-10-CM | POA: Insufficient documentation

## 2021-12-01 DIAGNOSIS — E119 Type 2 diabetes mellitus without complications: Secondary | ICD-10-CM | POA: Diagnosis not present

## 2021-12-01 DIAGNOSIS — R42 Dizziness and giddiness: Secondary | ICD-10-CM

## 2021-12-01 DIAGNOSIS — Z794 Long term (current) use of insulin: Secondary | ICD-10-CM | POA: Diagnosis not present

## 2021-12-01 DIAGNOSIS — R600 Localized edema: Secondary | ICD-10-CM | POA: Diagnosis not present

## 2021-12-01 DIAGNOSIS — R55 Syncope and collapse: Secondary | ICD-10-CM | POA: Insufficient documentation

## 2021-12-01 DIAGNOSIS — R519 Headache, unspecified: Secondary | ICD-10-CM

## 2021-12-01 DIAGNOSIS — M79672 Pain in left foot: Secondary | ICD-10-CM | POA: Insufficient documentation

## 2021-12-01 DIAGNOSIS — M818 Other osteoporosis without current pathological fracture: Secondary | ICD-10-CM

## 2021-12-01 DIAGNOSIS — D649 Anemia, unspecified: Secondary | ICD-10-CM | POA: Insufficient documentation

## 2021-12-01 DIAGNOSIS — W19XXXA Unspecified fall, initial encounter: Secondary | ICD-10-CM | POA: Diagnosis not present

## 2021-12-01 DIAGNOSIS — Z4802 Encounter for removal of sutures: Secondary | ICD-10-CM

## 2021-12-01 DIAGNOSIS — G44311 Acute post-traumatic headache, intractable: Secondary | ICD-10-CM

## 2021-12-01 DIAGNOSIS — M810 Age-related osteoporosis without current pathological fracture: Secondary | ICD-10-CM | POA: Insufficient documentation

## 2021-12-01 DIAGNOSIS — I10 Essential (primary) hypertension: Secondary | ICD-10-CM | POA: Insufficient documentation

## 2021-12-01 DIAGNOSIS — S0990XA Unspecified injury of head, initial encounter: Secondary | ICD-10-CM | POA: Diagnosis present

## 2021-12-01 DIAGNOSIS — S0083XA Contusion of other part of head, initial encounter: Secondary | ICD-10-CM | POA: Diagnosis not present

## 2021-12-01 LAB — COMPREHENSIVE METABOLIC PANEL
ALT: 41 U/L (ref 0–44)
AST: 37 U/L (ref 15–41)
Albumin: 3.3 g/dL — ABNORMAL LOW (ref 3.5–5.0)
Alkaline Phosphatase: 83 U/L (ref 38–126)
Anion gap: 10 (ref 5–15)
BUN: 32 mg/dL — ABNORMAL HIGH (ref 8–23)
CO2: 24 mmol/L (ref 22–32)
Calcium: 9.6 mg/dL (ref 8.9–10.3)
Chloride: 102 mmol/L (ref 98–111)
Creatinine, Ser: 1.1 mg/dL — ABNORMAL HIGH (ref 0.44–1.00)
GFR, Estimated: 53 mL/min — ABNORMAL LOW (ref >60)
Glucose, Bld: 183 mg/dL — ABNORMAL HIGH (ref 70–99)
Potassium: 5.2 mmol/L — ABNORMAL HIGH (ref 3.5–5.1)
Sodium: 136 mmol/L (ref 135–145)
Total Bilirubin: 0.4 mg/dL (ref 0.3–1.2)
Total Protein: 6.5 g/dL (ref 6.5–8.1)

## 2021-12-01 LAB — URINALYSIS, ROUTINE W REFLEX MICROSCOPIC
Bilirubin Urine: NEGATIVE
Glucose, UA: 150 mg/dL — AB
Ketones, ur: NEGATIVE mg/dL
Nitrite: NEGATIVE
Protein, ur: 100 mg/dL — AB
Specific Gravity, Urine: 1.015 (ref 1.005–1.030)
pH: 5 (ref 5.0–8.0)

## 2021-12-01 LAB — TROPONIN I (HIGH SENSITIVITY)
Troponin I (High Sensitivity): 4 ng/L (ref <18)
Troponin I (High Sensitivity): 4 ng/L (ref <18)

## 2021-12-01 LAB — CBC WITH DIFFERENTIAL/PLATELET
Abs Immature Granulocytes: 0.02 10*3/uL (ref 0.00–0.07)
Basophils Absolute: 0 10*3/uL (ref 0.0–0.1)
Basophils Relative: 0 %
Eosinophils Absolute: 0.2 10*3/uL (ref 0.0–0.5)
Eosinophils Relative: 5 %
HCT: 24.4 % — ABNORMAL LOW (ref 36.0–46.0)
Hemoglobin: 7.6 g/dL — ABNORMAL LOW (ref 12.0–15.0)
Immature Granulocytes: 1 %
Lymphocytes Relative: 22 %
Lymphs Abs: 0.9 10*3/uL (ref 0.7–4.0)
MCH: 20.3 pg — ABNORMAL LOW (ref 26.0–34.0)
MCHC: 31.1 g/dL (ref 30.0–36.0)
MCV: 65.2 fL — ABNORMAL LOW (ref 80.0–100.0)
Monocytes Absolute: 0.3 10*3/uL (ref 0.1–1.0)
Monocytes Relative: 8 %
Neutro Abs: 2.5 10*3/uL (ref 1.7–7.7)
Neutrophils Relative %: 64 %
Platelets: 94 10*3/uL — ABNORMAL LOW (ref 150–400)
RBC: 3.74 MIL/uL — ABNORMAL LOW (ref 3.87–5.11)
RDW: 14.5 % (ref 11.5–15.5)
WBC: 3.8 10*3/uL — ABNORMAL LOW (ref 4.0–10.5)
nRBC: 0 % (ref 0.0–0.2)

## 2021-12-01 MED ORDER — CALCIUM 500 MG PO TABS: 500.0000 mg | ORAL_TABLET | Freq: Two times a day (BID) | ORAL | 2 refills | Status: DC

## 2021-12-01 MED ORDER — ALENDRONATE SODIUM 70 MG PO TABS: 70.0000 mg | ORAL_TABLET | ORAL | 5 refills | Status: DC

## 2021-12-01 MED ORDER — VITAMIN D3 10 MCG (400 UNIT) PO TABS: 800.0000 [IU] | ORAL_TABLET | Freq: Every day | ORAL | 8 refills | Status: DC

## 2021-12-01 NOTE — ED Triage Notes (Signed)
Triage done using in person translator: Pt fell last Friday, saw her PCP today who sent her to ED to have head CT. Bruising noted to both eye area. Denies taking blood thinners. Family denies any AMS. Pt reports pain to head and left big toe and ankle.

## 2021-12-01 NOTE — ED Notes (Signed)
Pt was given urine sample cup and voided small amount with BM in it so it was unable to be obtained.

## 2021-12-01 NOTE — Progress Notes (Addendum)
Patient ID: Olivia Williamson, female    DOB: 07/19/1945  MRN: [id number]  CC: Hypertension   Subjective: Olivia Williamson is a 76 y.o. female who presents for chronic ds management.  Daughter is with her, An, with her. Ykeo from [employer] is present and interprets. 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 MF, osteoporosis, lumbar radiculopathy, COVID pneumonia 05/2021  This is a frail elderly female who presents today for evaluation post recent fall at home. According to the daughter, patient fell around 3 AM 3 days ago after she apparently got up in the middle of the night to use the restroom. -Patient states she does not recall much other than getting up and feeling dizzy and she does not remember anything after that.  Does not recall any chest pains or feeling of heart racing. -Patient was found face down and unconscious by the daughter and son-in-law facedown on the floor in her bedroom.  She became more responsive after about 5-7 inutes.  She was not taken to the emergency room but daughter knew she had appt coming up with me today. -of note, pt pt had another fall in February of this year while staying with another daughter in [place].  On that occasion, she felt dizzy and fell on her right side causing her right closed displaced right intertrochanteric femoral fracture.  Underwent surgery at [facility].  Bone density study revealed significant osteoporosis with T score of -3.9 in the lumbar region and -4.2 in the left femoral.  Today: Patient endorses dizziness, headache and facial pain since syncope 3 days ago.  Reports blurred vision.  No nausea or vomiting.  She has been ambulating with a walker.  Also complains of pain and swelling in the left foot that she states  has been going on for the past 3 weeks. She is requesting to have staples removed from the right leg/hip that were present since she had surgery on the right hip back in February of this year.  When questioned about whether the patient was taken back for follow-up with the orthopedic surgeon, daughter tells me that her sister took the patient for follow-up with orthopedics at [facility] 1 time.  They had since been trying to call to get a follow-up appointment to have the staples removed without success.  Daughter has patient's medication bottles.  Bottles that she have include spironolactone 25 mg half a tablet daily, Cozaar 50 mg daily, vitamin D 400 mg 2 tablets daily, calcium 600 mg twice a day, and  empty bottle of oxycodone Patient Active Problem List   Diagnosis Date Noted   COVID-19 05/29/2021   Stage 3a chronic kidney disease (CKD) ([place]) 05/29/2021   COVID-19 virus vaccination declined 02/13/2020   Allergic rhinitis due to allergen 05/02/2019   Periodontal disease 05/02/2019   Chronic cough 04/13/2019   Non-intractable vomiting    Hyponatremia    Community acquired pneumonia 03/16/2019   Positive for macroalbuminuria 02/05/2019   Pathological fracture of vertebra due to osteoporosis with routine healing 07/28/2017   Lumbar radiculopathy 02/02/2017   Chronic left-sided thoracic back pain 02/02/2017   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 Take with a full glass of water on an empty stomach. 4 tablet 5   b complex  vitamins capsule Take 1 capsule by mouth daily. (Patient taking differently: Take 1 capsule by mouth every morning.) 30 capsule 11   B-D UF III MINI PEN NEEDLES 31G X 5 MM MISC USE TO INJECT LANTUS ONCE A DAY AND NOVOLOG TWICE DAILY. (TOTAL OF 3 INJECTIONS DAILY) 100 each 2   Cholecalciferol (VITAMIN D3) 10 MCG (400 UNIT) tablet TAKE 2 TABLETS BY MOUTH EVERY DAY (Patient taking differently: Take 800 Units by mouth every morning.) 100 tablet 8   [employer] CALCIUM 600 & VITAMIN D3 600-20 MG-MCG TABS TAKE 1 TABLET (600 MG TOTAL) BY MOUTH 2 (TWO) TIMES DAILY WITH A MEAL. (Patient taking differently: Take 1 tablet by mouth in the morning and at bedtime.) 180 tablet 1   insulin aspart (NOVOLOG FLEXPEN) 100 UNIT/ML FlexPen 8 units with breakfast and dinner 15 mL 11   insulin glargine (LANTUS SOLOSTAR) 100 UNIT/ML Solostar Pen Inject 25 Units into the skin at bedtime. 15 mL 6   iron polysaccharides (NIFEREX) 150 MG capsule Take 1 capsule (150 mg total) by mouth daily. 30 capsule 1   losartan (COZAAR) 50 MG tablet Take 1 tablet (50 mg total) by mouth daily. 90 tablet 2   spironolactone (ALDACTONE) 25 MG tablet TAKE 1/2 TABLET BY MOUTH EVERY DAY 45 tablet 1   No current facility-administered medications on file prior to visit.    Allergies  Allergen Reactions   Shrimp [Shellfish Allergy] Shortness Of Breath and Other (See Comments)    Sneezing, also   Beef-Derived Products Other (See Comments)    Generalized "burning sensation"   Fruit & Vegetable Daily [Nutritional Supplements] Other (See Comments)    Oranges cause lower extremity burning   Metformin And Related Diarrhea   Norvasc [Amlodipine] Swelling and Other (See Comments)    Edema of the lower extremities    Social History   Socioeconomic History   Marital status: Widowed    Spouse name: Not on file   Number of children: Not on file   Years of education: Not on file   Highest education level: Not on file  Occupational History   Occupation:  retired  Tobacco Use   Smoking status: Never   Smokeless tobacco: Never  Substance and Sexual Activity   Alcohol use: No   Drug use: No   Sexual activity: Not on file  Other Topics Concern   Not on file  Social History Narrative   Not on file   Social Determinants of Health   Financial Resource Strain: Not on file  Food Insecurity: Not on file  Transportation Needs: Not on file  Physical Activity: Not on file  Stress: Not on file  Social Connections: Not on file  Intimate Partner Violence: Not on file    Family History  Problem Relation Age of Onset   Heart disease Maternal Uncle 50    Past Surgical History:  Procedure Laterality Date   ABDOMINAL HYSTERECTOMY     CHOLECYSTECTOMY N/A 05/02/2014   Procedure: LAPAROSCOPIC CHOLECYSTECTOMY ;  Surgeon: Coralie Keens, MD;  Location: [facility];  Service: General;  Laterality: N/A;  laparoscopic cholecystectomy   COLONOSCOPY     VIDEO BRONCHOSCOPY Bilateral 01/10/2016   Procedure: VIDEO BRONCHOSCOPY WITHOUT FLUORO;  Surgeon: Collene Gobble, MD;  Location: [facility];  Service: Cardiopulmonary;  Laterality: Bilateral;    ROS: Review of Systems Negative except as stated above  PHYSICAL EXAM: BP (!) 202/80   Pulse 69   Ht 4' 11"  (1.499 m)   LMP 05/11/2016   SpO2 99%   BMI 21.57 kg/m   Wt Readings from Last 3 Encounters:  07/15/21 106 lb 12.8 oz (48.4 kg)  06/16/21 103 lb (46.7 kg)  06/16/21 103 lb 9.6 oz (47 kg)  Sitting: BP 199/71, pulse 70 Standing: BP 174/77, pulse 78  Physical Exam  General appearance -frail elderly female sitting in wheelchair in no acute distress.  She has dry cough intermittently in my presence. Mental status -patient is able to answer some questions but does not follow commands consistently.  She does not recall the fainting episode other than she felt dizzy and then everything in front of her went black. Eyes -very pale conjunctiva Mouth -oral mucosa is moist Neck -neck is supple. Chest  -some fine crackles heard at the bases that clears some with repeated inspiration and expiration. Heart -regular rate and rhythm. Musculoskeletal -left foot: Some ecchymosis on the dorsal surface of the foot at the base of the second through the fourth toes.  The area is tender to touch.  Mild edema. Extremities -trace bilateral lower extremity edema. Skin: Patient with a 2 to 3 cm hematoma over the left side of the forehead.  She has significant ecchymosis around and below both eyes and over the bridge of the nose.  Some swelling noted over the maxillary sinus more so on the right side.  She has tenderness to touch over both maxillary sinus but more so on the right.  Tenderness on palpation over the nasal bridge Right thigh: Patient had 22 staples on the lateral aspect of the right thigh and 3 on the upper anterior thigh.  These were removed with a staple removal kit.  Surgical wounds are well-healed. Neurologic exam: Patient is able to answer some questions but confused at times.  She is unable to follow commands well which I suspect the language barrier is playing a role.  Pupils are equal and reactive.      Latest Ref Rng & Units 07/15/2021    4:28 PM 06/17/2021    6:21 AM 06/17/2021   12:19 AM  CMP  Glucose 70 - 99 mg/dL 137  104  239   BUN 8 - 27 mg/dL 42  26  31   Creatinine 0.57 - 1.00 mg/dL 1.10  0.75  0.85   Sodium 134 - 144 mmol/L 135  130  128   Potassium 3.5 - 5.2 mmol/L 5.0  4.1  4.2   Chloride 96 - 106 mmol/L 100  101  99   CO2 20 - 29 mmol/L 23  24  23    Calcium 8.7 - 10.3 mg/dL 9.8  8.7  8.7    Lipid Panel     Component Value Date/Time   CHOL 133 10/29/2020 1621   TRIG 160 (H) 10/29/2020 1621   HDL 43 10/29/2020 1621   CHOLHDL 3.1 10/29/2020 1621   CHOLHDL 2.7 03/18/2019 0358   VLDL 13 03/18/2019 0358   LDLCALC 63 10/29/2020 1621    CBC    Component Value Date/Time   WBC 3.1 (L) 06/17/2021 0019  RBC 3.23 (L) 06/17/2021 0019   HGB 7.9 (L) 06/17/2021 0019   HGB  8.5 (L) 06/16/2021 1212   HGB 9.5 (L) 06/09/2021 1507   HCT 22.7 (L) 06/17/2021 0019   HCT 29.1 (L) 06/09/2021 1507   PLT 73 (L) 06/17/2021 0019   PLT 81 (L) 06/16/2021 1212   PLT 82 (LL) 06/09/2021 1507   MCV 70.3 (L) 06/17/2021 0019   MCV 73 (L) 06/09/2021 1507   MCH 24.5 (L) 06/17/2021 0019   MCHC 34.8 06/17/2021 0019   RDW 22.3 (H) 06/17/2021 0019   RDW 24.6 (H) 06/09/2021 1507   LYMPHSABS 0.8 06/17/2021 0019   LYMPHSABS 0.8 06/09/2021 1507   MONOABS 0.3 06/17/2021 0019   EOSABS 0.1 06/17/2021 0019   EOSABS 0.2 06/09/2021 1507   BASOSABS 0.0 06/17/2021 0019   BASOSABS 0.0 06/09/2021 1507    ASSESSMENT AND PLAN: 1. Syncope and collapse 2. Dizziness Patient with recent syncope after having a dizzy spell.  She has significant ecchymosis to the face with a hematoma on the left side of the forehead.  She endorses headache, dizziness and blurred vision since the fall and facial pain.  Blood pressure is elevated today but is orthostatic with drop in systolic blood pressure of greater than 20 mmHg on standing.  Given her frailty and symptoms, I recommend that she be seen in the emergency room to have imaging done of her head and face and evaluated for dehydration and check for worsening anemia.  Daughter will take her today. -If work up is okay, may refer to cardiology to be considered for monitor as this is the second serious fall associated with dizziness pt has had in the past 6 mths resulting in serious injury. -Advised the daughter that patient should have been taken to the emergency room the morning of the fall given the fact that she passed out after the fall. -May consider referral to Adult Protective Services.  I also find it disturbing that the patient's staples from hip fracture surgery that was done back in February of this year were still present and were removed by me today.  3. Orthostatic hypertension See # 2 above  4. Intractable acute post-traumatic headache See #2  above  5. Facial pain, acute Will need imaging to r/o facial fracture  6. Acute foot pain, left Patient reports this has been present for the past 3 weeks.  She does have some ecchymosis on the dorsal surface of the foot suggesting that some trauma occurred and this may actually be related to her recent fall.  She will need x-ray.  7. Removal of staples 25 staples were removed from the right thigh today  8. Other osteoporosis, unspecified pathological fracture presence Advised patient and daughter of diagnosis of osteoporosis and the need to be on Fosamax.  I will send updated prescription to her pharmacy.  Advised that the medication is to be taken once a week first thing in the mornings with a large glass of water at least 1 hour before any of her other medications and before she eats. Continue Ca+ and Vit D supplement.   I spent 45 minutes in the direct care of this patient during this visit today.  This included time spent reviewing her records from [facility], face-to-face time with the patient discussing symptoms, diagnosis and management and post visit entering of orders.  Patient was directed to the emergency room. Patient was given the opportunity to ask questions.  Patient verbalized understanding of the plan and was  able to repeat key elements of the plan.   This documentation was completed using Radio producer.  Any transcriptional errors are unintentional.  No orders of the defined types were placed in this encounter.    Requested Prescriptions    No prescriptions requested or ordered in this encounter    Return in about 2 weeks (around 12/15/2021).  Karle Plumber, MD, FACP

## 2021-12-01 NOTE — ED Provider Triage Note (Signed)
Emergency Medicine Provider Triage Evaluation Note  Olivia Williamson , a 76 y.o. female  was evaluated in triage.  Pt complains of follow-up. History is obtained from patient, her daughter who is at bedside through in person montagnard interpreter.  Patient was seen at PCPs office today who sent her here.  She reportedly had a syncopal event on Friday morning.  She was reportedly unconscious for about 5 minutes according to PCP note. She was concerned about the amount of contusions on patient's face. Patient's daughter states that patient is acting normally.  Patient also reports pain in both of her feet.  She denies any pain in her chest, or back.  She denies any neck pain.    Physical Exam  BP (!) 187/61 (BP Location: Left Arm)   Pulse 70   Temp 98.6 F (37 C) (Oral)   Resp 18   Ht 4\' 11"  (1.499 m)   Wt 48.1 kg   LMP 05/11/2016   SpO2 99%   BMI 21.41 kg/m  Gen:   Awake, no distress   Resp:  Normal effort  MSK:   Moves extremities without difficulty  Other:  Patient with extensive periorbital contusions bilaterally.  There is no battle signs.  She does not have any midline C-spine tenderness.  There is contusion on the middle forehead. She has contusions on her bilateral feet left greater than right.  Medical Decision Making  Medically screening exam initiated at 6:34 PM.  Appropriate orders placed.  Aaleigha Bozza was informed that the remainder of the evaluation will be completed by another provider, this initial triage assessment does not replace that evaluation, and the importance of remaining in the ED until their evaluation is complete.  Clinically given patient's degree of bilateral periorbital contusions I am concerned that she may have a basilar skull fracture. CT imaging is ordered.  Additionally given that she was unconscious resulting in this fall we will check labs, urine.   Yehuda Budd, Cristina Gong 12/01/21 1836

## 2021-12-01 NOTE — Patient Instructions (Signed)
Please take patient to the emergency room for further evaluation

## 2021-12-02 ENCOUNTER — Telehealth: Payer: Self-pay

## 2021-12-02 DIAGNOSIS — S0083XA Contusion of other part of head, initial encounter: Secondary | ICD-10-CM | POA: Diagnosis not present

## 2021-12-02 MED ORDER — SODIUM CHLORIDE 0.9 % IV BOLUS
500.0000 mL | Freq: Once | INTRAVENOUS | Status: AC
  Administered 2021-12-02: 500 mL via INTRAVENOUS

## 2021-12-02 NOTE — ED Provider Notes (Signed)
[facility] EMERGENCY DEPARTMENT Provider Note   CSN: [id number] Arrival date & time: 12/01/21  1737     History  Chief Complaint  Patient presents with   Fall    Olivia Williamson is a 76 y.o. female.  HPI     Interpreter used for history taking.  Daughter provides most of the history.  This is a 76 year old female who presents with her daughter with concerns for head injury.  Patient reportedly fell on Friday evening.  Daughter states that they heard a thump and found her on the floor.  Daughter reports that her mother remembers falling but thought she fell on the bed.  Daughter reports that she actually fell onto her wheelchair.  They have noted increasing bruising of the face and got concerned.  She was seen by her primary physician yesterday and was referred here for CT scan.  Daughter notes that she has been ambulating at her baseline.  She has had good p.o. intake.  No weakness, numbness, tingling, strokelike symptoms.  She has noted some slight lower extremity edema.  They have also noted that she has complained of some left foot pain since the fall.  Of note, based on chart review, patient with chronic thrombocytopenia and anemia with baseline hemoglobin of 7.5-9.5.  History of hemoglobin E disease   Home Medications Prior to Admission medications   Medication Sig Start Date End Date Taking? Authorizing Provider  alendronate (FOSAMAX) 70 MG tablet Take 1 tablet (70 mg total) by mouth every 7 (seven) days. Take with a full glass of water on an empty stomach. 12/01/21   [name], MD  b complex vitamins capsule Take 1 capsule by mouth daily. Patient taking differently: Take 1 capsule by mouth every morning. 06/16/21   Brunetta Genera, MD  B-D UF III MINI PEN NEEDLES 31G X 5 MM MISC USE TO INJECT LANTUS ONCE A DAY AND NOVOLOG TWICE DAILY. (TOTAL OF 3 INJECTIONS DAILY) 06/23/21   [name], MD  Calcium 500 MG tablet Take 1 tablet (500 mg total) by  mouth 2 (two) times daily. 12/01/21   [name], MD  Cholecalciferol (VITAMIN D3) 10 MCG (400 UNIT) tablet Take 2 tablets (800 Units total) by mouth daily. 12/01/21   [name], MD  insulin aspart (NOVOLOG FLEXPEN) 100 UNIT/ML FlexPen 8 units with breakfast and dinner 07/15/21   [name], MD  insulin glargine (LANTUS SOLOSTAR) 100 UNIT/ML Solostar Pen Inject 25 Units into the skin at bedtime. 07/15/21   [name], MD  iron polysaccharides (NIFEREX) 150 MG capsule Take 1 capsule (150 mg total) by mouth daily. 06/16/21   Brunetta Genera, MD  losartan (COZAAR) 50 MG tablet Take 1 tablet (50 mg total) by mouth daily. 07/15/21   [name], MD  spironolactone (ALDACTONE) 25 MG tablet TAKE 1/2 TABLET BY MOUTH EVERY DAY 10/10/21   [name], MD      Allergies    Shrimp [shellfish allergy], Beef-derived products, Fruit & vegetable daily [nutritional supplements], Metformin and related, and Norvasc [amlodipine]    Review of Systems   Review of Systems  Constitutional:  Negative for fever.  Respiratory:  Negative for shortness of breath.   Cardiovascular:  Positive for leg swelling. Negative for chest pain.  Musculoskeletal:  Negative for neck pain.  All other systems reviewed and are negative.   Physical Exam Updated Vital Signs BP (!) 180/63   Pulse 88   Temp  98.6 F (37 C) (Oral)   Resp 18   Ht 1.499 m (4' 11" )   Wt 48.1 kg   LMP 05/11/2016   SpO2 96%   BMI 21.41 kg/m  Physical Exam Vitals and nursing note reviewed.  Constitutional:      Appearance: She is well-developed.     Comments: ABCs intact  HENT:     Head: Normocephalic.     Comments: Hematoma noted to the left forehead with bruising and ecchymosis noted extending into the nose and periorbital regions bilaterally Eyes:     Pupils: Pupils are equal, round, and reactive to light.  Cardiovascular:     Rate and Rhythm: Normal rate and regular rhythm.     Heart sounds:  Normal heart sounds.  Pulmonary:     Effort: Pulmonary effort is normal. No respiratory distress.     Breath sounds: No wheezing.  Abdominal:     Palpations: Abdomen is soft.     Tenderness: There is no abdominal tenderness.  Musculoskeletal:     Cervical back: Neck supple.     Comments: Trace bilateral lower extremity edema  Skin:    General: Skin is warm and dry.  Neurological:     Mental Status: She is alert and oriented to person, place, and time.     Comments: 5 out of 5 strength in all 4 extremities, cranial nerves II through XII intact     ED Results / Procedures / Treatments   Labs (all labs ordered are listed, but only abnormal results are displayed) Labs Reviewed  COMPREHENSIVE METABOLIC PANEL - Abnormal; Notable for the following components:      Result Value   Potassium 5.2 (*)    Glucose, Bld 183 (*)    BUN 32 (*)    Creatinine, Ser 1.10 (*)    Albumin 3.3 (*)    GFR, Estimated 53 (*)    All other components within normal limits  CBC WITH DIFFERENTIAL/PLATELET - Abnormal; Notable for the following components:   WBC 3.8 (*)    RBC 3.74 (*)    Hemoglobin 7.6 (*)    HCT 24.4 (*)    MCV 65.2 (*)    MCH 20.3 (*)    Platelets 94 (*)    All other components within normal limits  URINALYSIS, ROUTINE W REFLEX MICROSCOPIC - Abnormal; Notable for the following components:   Glucose, UA 150 (*)    Hgb urine dipstick SMALL (*)    Protein, ur 100 (*)    Leukocytes,Ua TRACE (*)    Bacteria, UA RARE (*)    All other components within normal limits  TROPONIN I (HIGH SENSITIVITY)  TROPONIN I (HIGH SENSITIVITY)    EKG EKG Interpretation  Date/Time:  Monday December 01 2021 18:40:42 EDT Ventricular Rate:  72 PR Interval:  158 QRS Duration: 66 QT Interval:  372 QTC Calculation: 407 R Axis:   16 Text Interpretation: Normal sinus rhythm Normal ECG When compared with ECG of 28-May-2021 16:09, PREVIOUS ECG IS PRESENT Confirmed by Thayer Jew [id number]) on 12/02/2021  1:55:22 AM  Radiology CT Head Wo Contrast  Result Date: 12/01/2021 CLINICAL DATA:  [place] last Friday, head trauma, neck trauma, bruising to both eyes, history hypertension, diabetes mellitus EXAM: CT HEAD WITHOUT CONTRAST CT MAXILLOFACIAL WITHOUT CONTRAST CT CERVICAL SPINE WITHOUT CONTRAST TECHNIQUE: Multidetector CT imaging of the head, cervical spine, and maxillofacial structures were performed using the standard protocol without intravenous contrast. Multiplanar CT image reconstructions of the cervical spine and maxillofacial structures were  also generated. RADIATION DOSE REDUCTION: This exam was performed according to the departmental dose-optimization program which includes automated exposure control, adjustment of the mA and/or kV according to patient size and/or use of iterative reconstruction technique. COMPARISON:  CT head 05/28/2021 FINDINGS: CT HEAD FINDINGS Brain: Mild age-related atrophy. Normal ventricular morphology. No midline shift or mass effect. Otherwise normal appearance of brain parenchyma. No intracranial hemorrhage, mass lesion, or evidence of acute infarction. No extra-axial fluid collections. Vascular: No hyperdense vessels. Atherosclerotic calcification of internal carotid arteries at skull base. Skull: Intact.  Small LEFT frontal scalp hematoma. Other: N/A CT MAXILLOFACIAL FINDINGS Osseous: TMJ alignment normal. Facial bones intact. Nasal septal deviation to the RIGHT. No facial bone abnormalities identified. Orbits: Intraorbital soft tissue planes clear without air or pneumatosis. Sinuses: Small amount of fluid mucus within sphenoid sinus. Scattered mucosal thickening in ethmoid air cells and LEFT maxillary sinus. Remaining paranasal sinuses, mastoid air cells, and middle ear cavities clear. Soft tissues: LEFT frontal scalp hematoma. Mild periorbital soft tissue swelling extending into the anterior and lateral facial soft tissues. CT CERVICAL SPINE FINDINGS Alignment: Normal Skull  base and vertebrae: Osseous demineralization. Vertebral body and disc space heights maintained. Skull base intact. No fracture or subluxation. Small nonspecific lucent foci multiple cervical and upper thoracic vertebral bodies, nonspecific. Soft tissues and spinal canal: Prevertebral soft tissues normal thickness. Extensive atherosclerotic calcification at carotid bifurcations bilaterally. Small BILATERAL thyroid nodules and calcifications all less than 12 mm diameter; no follow-up imaging recommended. Disc levels:  No specific abnormalities Upper chest: Lung apices clear Other: N/A IMPRESSION: No acute intracranial abnormalities. Small LEFT frontal scalp hematoma and periorbital/facial soft tissue swelling. No acute facial bone abnormalities. Osseous demineralization. No acute cervical spine abnormalities. Small lucent foci within cervical and upper thoracic vertebral bodies, nonspecific, may be related to the severe degree of osseous demineralization present though difficult to exclude multiple myeloma with this appearance; consider workup for multiple myeloma. Electronically Signed   By: Lavonia Dana M.D.   On: 12/01/2021 20:05   CT Cervical Spine Wo Contrast  Result Date: 12/01/2021 CLINICAL DATA:  [place] last Friday, head trauma, neck trauma, bruising to both eyes, history hypertension, diabetes mellitus EXAM: CT HEAD WITHOUT CONTRAST CT MAXILLOFACIAL WITHOUT CONTRAST CT CERVICAL SPINE WITHOUT CONTRAST TECHNIQUE: Multidetector CT imaging of the head, cervical spine, and maxillofacial structures were performed using the standard protocol without intravenous contrast. Multiplanar CT image reconstructions of the cervical spine and maxillofacial structures were also generated. RADIATION DOSE REDUCTION: This exam was performed according to the departmental dose-optimization program which includes automated exposure control, adjustment of the mA and/or kV according to patient size and/or use of iterative  reconstruction technique. COMPARISON:  CT head 05/28/2021 FINDINGS: CT HEAD FINDINGS Brain: Mild age-related atrophy. Normal ventricular morphology. No midline shift or mass effect. Otherwise normal appearance of brain parenchyma. No intracranial hemorrhage, mass lesion, or evidence of acute infarction. No extra-axial fluid collections. Vascular: No hyperdense vessels. Atherosclerotic calcification of internal carotid arteries at skull base. Skull: Intact.  Small LEFT frontal scalp hematoma. Other: N/A CT MAXILLOFACIAL FINDINGS Osseous: TMJ alignment normal. Facial bones intact. Nasal septal deviation to the RIGHT. No facial bone abnormalities identified. Orbits: Intraorbital soft tissue planes clear without air or pneumatosis. Sinuses: Small amount of fluid mucus within sphenoid sinus. Scattered mucosal thickening in ethmoid air cells and LEFT maxillary sinus. Remaining paranasal sinuses, mastoid air cells, and middle ear cavities clear. Soft tissues: LEFT frontal scalp hematoma. Mild periorbital soft tissue swelling extending into the anterior  and lateral facial soft tissues. CT CERVICAL SPINE FINDINGS Alignment: Normal Skull base and vertebrae: Osseous demineralization. Vertebral body and disc space heights maintained. Skull base intact. No fracture or subluxation. Small nonspecific lucent foci multiple cervical and upper thoracic vertebral bodies, nonspecific. Soft tissues and spinal canal: Prevertebral soft tissues normal thickness. Extensive atherosclerotic calcification at carotid bifurcations bilaterally. Small BILATERAL thyroid nodules and calcifications all less than 12 mm diameter; no follow-up imaging recommended. Disc levels:  No specific abnormalities Upper chest: Lung apices clear Other: N/A IMPRESSION: No acute intracranial abnormalities. Small LEFT frontal scalp hematoma and periorbital/facial soft tissue swelling. No acute facial bone abnormalities. Osseous demineralization. No acute cervical spine  abnormalities. Small lucent foci within cervical and upper thoracic vertebral bodies, nonspecific, may be related to the severe degree of osseous demineralization present though difficult to exclude multiple myeloma with this appearance; consider workup for multiple myeloma. Electronically Signed   By: Lavonia Dana M.D.   On: 12/01/2021 20:05   CT Maxillofacial WO CM  Result Date: 12/01/2021 CLINICAL DATA:  [place] last Friday, head trauma, neck trauma, bruising to both eyes, history hypertension, diabetes mellitus EXAM: CT HEAD WITHOUT CONTRAST CT MAXILLOFACIAL WITHOUT CONTRAST CT CERVICAL SPINE WITHOUT CONTRAST TECHNIQUE: Multidetector CT imaging of the head, cervical spine, and maxillofacial structures were performed using the standard protocol without intravenous contrast. Multiplanar CT image reconstructions of the cervical spine and maxillofacial structures were also generated. RADIATION DOSE REDUCTION: This exam was performed according to the departmental dose-optimization program which includes automated exposure control, adjustment of the mA and/or kV according to patient size and/or use of iterative reconstruction technique. COMPARISON:  CT head 05/28/2021 FINDINGS: CT HEAD FINDINGS Brain: Mild age-related atrophy. Normal ventricular morphology. No midline shift or mass effect. Otherwise normal appearance of brain parenchyma. No intracranial hemorrhage, mass lesion, or evidence of acute infarction. No extra-axial fluid collections. Vascular: No hyperdense vessels. Atherosclerotic calcification of internal carotid arteries at skull base. Skull: Intact.  Small LEFT frontal scalp hematoma. Other: N/A CT MAXILLOFACIAL FINDINGS Osseous: TMJ alignment normal. Facial bones intact. Nasal septal deviation to the RIGHT. No facial bone abnormalities identified. Orbits: Intraorbital soft tissue planes clear without air or pneumatosis. Sinuses: Small amount of fluid mucus within sphenoid sinus. Scattered mucosal  thickening in ethmoid air cells and LEFT maxillary sinus. Remaining paranasal sinuses, mastoid air cells, and middle ear cavities clear. Soft tissues: LEFT frontal scalp hematoma. Mild periorbital soft tissue swelling extending into the anterior and lateral facial soft tissues. CT CERVICAL SPINE FINDINGS Alignment: Normal Skull base and vertebrae: Osseous demineralization. Vertebral body and disc space heights maintained. Skull base intact. No fracture or subluxation. Small nonspecific lucent foci multiple cervical and upper thoracic vertebral bodies, nonspecific. Soft tissues and spinal canal: Prevertebral soft tissues normal thickness. Extensive atherosclerotic calcification at carotid bifurcations bilaterally. Small BILATERAL thyroid nodules and calcifications all less than 12 mm diameter; no follow-up imaging recommended. Disc levels:  No specific abnormalities Upper chest: Lung apices clear Other: N/A IMPRESSION: No acute intracranial abnormalities. Small LEFT frontal scalp hematoma and periorbital/facial soft tissue swelling. No acute facial bone abnormalities. Osseous demineralization. No acute cervical spine abnormalities. Small lucent foci within cervical and upper thoracic vertebral bodies, nonspecific, may be related to the severe degree of osseous demineralization present though difficult to exclude multiple myeloma with this appearance; consider workup for multiple myeloma. Electronically Signed   By: Lavonia Dana M.D.   On: 12/01/2021 20:05   DG Foot Complete Right  Result Date: 12/01/2021 CLINICAL DATA:  Pain, fall EXAM: RIGHT FOOT COMPLETE - 3+ VIEW COMPARISON:  None Available. FINDINGS: No acute bony abnormality. Specifically, no fracture, subluxation, or dislocation. Soft tissues are intact. IMPRESSION: No acute bony abnormality. Electronically Signed   By: Rolm Baptise M.D.   On: 12/01/2021 19:24   DG Foot Complete Left  Result Date: 12/01/2021 CLINICAL DATA:  Fall, pain EXAM: LEFT FOOT -  COMPLETE 3+ VIEW COMPARISON:  06/27/2020 FINDINGS: No acute bony abnormality. Specifically, no fracture, subluxation, or dislocation. Soft tissues are intact. IMPRESSION: No acute bony abnormality. Electronically Signed   By: Rolm Baptise M.D.   On: 12/01/2021 19:23    Procedures Procedures    Medications Ordered in ED Medications  sodium chloride 0.9 % bolus 500 mL (500 mLs Intravenous New Bag/Given 12/02/21 0277)    ED Course/ Medical Decision Making/ A&P Clinical Course as of 12/02/21 0314  Tue Dec 02, 2021  0207 Patient has slight drop in her blood pressure upon standing.  Did not feel dizzy but nursing noted that she appeared unsteady on her feet.  We will give a 500 cc bolus. [CH]    Clinical Course User Index [CH] Mauri Tolen, Barbette Hair, MD                           Medical Decision Making  This patient presents to the ED for concern of fall, facial injury, this involves an extensive number of treatment options, and is a complaint that carries with it a high risk of complications and morbidity.  I considered the following differential and admission for this acute, potentially life threatening condition.  The differential diagnosis includes hematoma, fracture, intracranial bleed  MDM:    This is a 76 year old female who presents following a fall.  Reported mechanical fall at home greater than 3 days ago.  Has significant ecchymosis to the face.  Is not on any blood thinners but has chronic thrombocytopenia and anemia.  Labs obtained in triage as well as imaging.  CT imaging is negative for intracranial bleed.  No facial fractures or cervical spine fractures.  There is an abnormality on her CT cervical spine which could be related to multiple myeloma.  Recommend outpatient work-up.  Labs are consistent with patient's prior.  Baseline hemoglobin 7.5-9.5.  Orthostatics were obtained.  Not technically orthostatic although the nurses noted that the patient appeared unsteady.  Patient was given  500 cc of fluid.  We will refer her back to her primary physician.  Recommend ice and Tylenol.  (Labs, imaging, consults)  Labs: I Ordered, and personally interpreted labs.  The pertinent results include: CBC, BMP  Imaging Studies ordered: I ordered imaging studies including CT head, face, neck, bilateral foot films I independently visualized and interpreted imaging. I agree with the radiologist interpretation  Additional history obtained from daughter.  External records from outside source obtained and reviewed including outpatient PCP note  Cardiac Monitoring: The patient was maintained on a cardiac monitor.  I personally viewed and interpreted the cardiac monitored which showed an underlying rhythm of: Normal sinus  Reevaluation: After the interventions noted above, I reevaluated the patient and found that they have :stayed the same  Social Determinants of Health: Elderly, lives with daughter  Disposition: Discharge  Co morbidities that complicate the patient evaluation  Past Medical History:  Diagnosis Date   Arthritis    Diabetes mellitus without complication ([place])    stopped DM meds 2004   GERD (gastroesophageal reflux disease)  Hypertension    meds stopped 1 week ago , need to get renewed   Tuberculosis    ACTIVE     Medicines Meds ordered this encounter  Medications   sodium chloride 0.9 % bolus 500 mL    I have reviewed the patients home medicines and have made adjustments as needed  Problem List / ED Course: Problem List Items Addressed This Visit   None Visit Diagnoses     Fall, initial encounter    -  Primary   Contusion of face, initial encounter       Chronic anemia                       Final Clinical Impression(s) / ED Diagnoses Final diagnoses:  Fall, initial encounter  Contusion of face, initial encounter  Chronic anemia    Rx / DC Orders ED Discharge Orders     None         Nieshia Larmon, Barbette Hair, MD 12/02/21  [phone]

## 2021-12-02 NOTE — Telephone Encounter (Signed)
After speaking to Dr Laural Benes regarding her concerns about patient's care, I called [facility] # [phone]  to place a referral.  Message left with call back requested.

## 2021-12-02 NOTE — Discharge Instructions (Signed)
You were seen today for a fall your CT scan imaging is negative for acute fractures.  Apply ice.  Take Tylenol for any pain or discomfort.  Your CT scan of your neck did show an abnormality in 1 spot of your C-spine.  It was suggested that you have work-up as an outpatient for multiple myeloma.  Follow-up with your primary physician for this work-up.
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
## 2021-12-03 ENCOUNTER — Telehealth: Payer: Self-pay | Admitting: Internal Medicine

## 2021-12-03 DIAGNOSIS — M8950 Osteolysis, unspecified site: Secondary | ICD-10-CM

## 2021-12-03 NOTE — Telephone Encounter (Signed)
PC placed to pt's daughter An today.  I left a voicemail message informing her that I need to have the patient come to the lab to have some blood test done. I reviewed chemistry and imaging studies done in the emergency room at her recent visit there.  On CAT scan of the cervical spine, radiologist made mention of small foci within the cervical and upper thoracic vertebral bodies that is nonspecific but may be related to severe degree of osseous demineralization present though difficult to exclude multiple myeloma with this appearance.  He recommended to consider work-up for multiple myeloma.  I will put orders in the system to do SPEP.

## 2021-12-03 NOTE — Telephone Encounter (Signed)
I received a voicemail message this morning from [facility] stating the they are not accepting the referral

## 2021-12-04 NOTE — Telephone Encounter (Signed)
Patient's daughter returning provider's call  Daughter requesting to be called back at    [phone]

## 2021-12-04 NOTE — Telephone Encounter (Signed)
Pt is returning phone call ° °

## 2021-12-07 NOTE — Telephone Encounter (Signed)
PC placed to pt's daughter An today.  Left message informing of who I am.  I will try to reach her again this week.

## 2021-12-10 NOTE — Telephone Encounter (Signed)
I tried to reach patient's daughter An again today without success.  I did not leave a message.  Will have CMA schedule an appt for ER f/u with me.

## 2021-12-11 NOTE — Telephone Encounter (Signed)
Pt has been scheduled fot 12/26/2021 at 950 daughter has been informed.

## 2021-12-26 ENCOUNTER — Ambulatory Visit: Payer: Medicare Other | Attending: Internal Medicine | Admitting: Internal Medicine

## 2021-12-26 VITALS — BP 171/78 | HR 68 | Ht <= 58 in | Wt 105.0 lb

## 2021-12-26 DIAGNOSIS — D649 Anemia, unspecified: Secondary | ICD-10-CM | POA: Insufficient documentation

## 2021-12-26 DIAGNOSIS — R55 Syncope and collapse: Secondary | ICD-10-CM | POA: Insufficient documentation

## 2021-12-26 DIAGNOSIS — E1142 Type 2 diabetes mellitus with diabetic polyneuropathy: Secondary | ICD-10-CM | POA: Diagnosis present

## 2021-12-26 DIAGNOSIS — N1831 Chronic kidney disease, stage 3a: Secondary | ICD-10-CM | POA: Diagnosis not present

## 2021-12-26 DIAGNOSIS — W19XXXD Unspecified fall, subsequent encounter: Secondary | ICD-10-CM | POA: Insufficient documentation

## 2021-12-26 DIAGNOSIS — Z8616 Personal history of COVID-19: Secondary | ICD-10-CM | POA: Diagnosis not present

## 2021-12-26 DIAGNOSIS — N289 Disorder of kidney and ureter, unspecified: Secondary | ICD-10-CM | POA: Diagnosis not present

## 2021-12-26 DIAGNOSIS — I129 Hypertensive chronic kidney disease with stage 1 through stage 4 chronic kidney disease, or unspecified chronic kidney disease: Secondary | ICD-10-CM | POA: Insufficient documentation

## 2021-12-26 DIAGNOSIS — Z1211 Encounter for screening for malignant neoplasm of colon: Secondary | ICD-10-CM | POA: Diagnosis not present

## 2021-12-26 DIAGNOSIS — Z794 Long term (current) use of insulin: Secondary | ICD-10-CM | POA: Insufficient documentation

## 2021-12-26 DIAGNOSIS — S0993XD Unspecified injury of face, subsequent encounter: Secondary | ICD-10-CM | POA: Diagnosis not present

## 2021-12-26 DIAGNOSIS — M5416 Radiculopathy, lumbar region: Secondary | ICD-10-CM | POA: Diagnosis not present

## 2021-12-26 DIAGNOSIS — M818 Other osteoporosis without current pathological fracture: Secondary | ICD-10-CM | POA: Diagnosis not present

## 2021-12-26 DIAGNOSIS — I10 Essential (primary) hypertension: Secondary | ICD-10-CM | POA: Diagnosis not present

## 2021-12-26 DIAGNOSIS — E1122 Type 2 diabetes mellitus with diabetic chronic kidney disease: Secondary | ICD-10-CM | POA: Diagnosis not present

## 2021-12-26 DIAGNOSIS — Z79899 Other long term (current) drug therapy: Secondary | ICD-10-CM | POA: Diagnosis not present

## 2021-12-26 LAB — POCT GLYCOSYLATED HEMOGLOBIN (HGB A1C): HbA1c, POC (controlled diabetic range): 6.7 % (ref 0.0–7.0)

## 2021-12-26 LAB — GLUCOSE, POCT (MANUAL RESULT ENTRY): POC Glucose: 107 mg/dl — AB (ref 70–99)

## 2021-12-26 MED ORDER — HYDRALAZINE HCL 10 MG PO TABS: 10.0000 mg | ORAL_TABLET | Freq: Two times a day (BID) | ORAL | 6 refills | Status: DC

## 2021-12-26 MED ORDER — POLYSACCHARIDE IRON COMPLEX 150 MG PO CAPS: 150.0000 mg | ORAL_CAPSULE | Freq: Every day | ORAL | 1 refills | Status: AC

## 2021-12-26 NOTE — Progress Notes (Signed)
Pt Requesting RF on Rx's.

## 2021-12-26 NOTE — Progress Notes (Signed)
bmp   Patient ID: Olivia Williamson, female    DOB: [birth year]  MRN: [id number]  CC: Hospitalization Follow-up   Subjective: Olivia Williamson is a 76 y.o. female who presents for hosp f/u.  Daughter is with her, An, with her. Y Hin from [employer] is present and interprets. 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 MF, Osteoporosis, lumbar radiculopathy, COVID pneumonia 05/2021  Patient presents for follow-up from ER.  She was sent to the emergency room by me 12/01/2021 when she presented with syncope and orthostatic hypotension resulting in facial injury.  This was the 2nd injurious fall she had this yr.  Had one 07/2021 that resulted in LT hip frx that was treated surgical Labs from the emergency room showed mild elevation in potassium of 5.2.  Creatinine was stable at 1.1 and GFR was 53 with previous levels being greater than 60. CBC:  Imaging studies: CT of head/cervical/head: No acute intracranial abnormalities.  Small left frontal scalp hematoma and periorbital facial soft tissue swelling.  Small lucent foci within the cervical and upper thoracic vertebral bodies, nonspecific, may be related to severe degree of osseous demineralization though difficult to exclude multiple myeloma with this appearance.  Consider work-up for multiple myeloma  Today:  no further falls since last visit No dizziness or SOB or tiredness. Uses walker at home but also has WC No blood in stools Has chronic anemia but H/H has been trending down since earlier this year.  Supposed to be on iron supplement but does not have it.  She has not seen her hematologist Dr. [name] in a while.  DM:  Results for orders placed or performed in visit on 12/26/21  POCT glycosylated hemoglobin (Hb A1C)  Result Value Ref Range    Hemoglobin A1C     HbA1c POC (<> result, manual entry)     HbA1c, POC (prediabetic range)     HbA1c, POC (controlled diabetic range) 6.7 0.0 - 7.0 %  POCT glucose (manual entry)  Result Value Ref Range   POC Glucose 107 (A) 70 - 99 mg/dl  checks BS in a.m and evenings Gives range low 100s. No BS below 80 Reports compliance with Novolog 8 units with BF and dinner.  Out of Lantus x 3-4 mths because her insurance does not cover any more.  HTN:   On Spironolactone 25 mg and Cozaar 50 mg daily.  She has taken the medicines already for today.  Osteoporosis:  taking the Fosmax, Vit D 800 IU daily and Ca+ 500 mg BID.  Has bottles with her today.  HM:  never had eye exam done last yr.  Never got MMG.  Over due for colon CA screen.  Patient Active Problem List   Diagnosis Date Noted   COVID-19 05/29/2021   Stage 3a chronic kidney disease (CKD) ([place]) 05/29/2021   COVID-19 virus vaccination declined 02/13/2020   Allergic rhinitis due to allergen 05/02/2019   Periodontal disease 05/02/2019   Chronic cough 04/13/2019   Non-intractable vomiting    Hyponatremia    Community acquired pneumonia 03/16/2019   Positive for macroalbuminuria 02/05/2019   Pathological fracture of vertebra due to osteoporosis with routine healing 07/28/2017   Lumbar radiculopathy 02/02/2017   Chronic left-sided thoracic back pain 02/02/2017   Hemoglobin E (hb-e) ([place]) 11/01/2016  Multinodular goiter 11/01/2016   Thyroid nodule 04/13/2016   History of tuberculosis 01/09/2016   Generalized weakness 12/15/2015   Pancytopenia ([place]) 12/15/2015   Essential hypertension    GERD (gastroesophageal reflux disease)    Diabetes mellitus with neurological manifestations ([place])      Current Outpatient Medications on File Prior to Visit  Medication Sig Dispense Refill   alendronate (FOSAMAX) 70 MG tablet Take 1 tablet (70 mg total) by mouth every 7 (seven) days. Take with a full glass of water on an empty stomach. 4 tablet 5    b complex vitamins capsule Take 1 capsule by mouth daily. (Patient taking differently: Take 1 capsule by mouth every morning.) 30 capsule 11   B-D UF III MINI PEN NEEDLES 31G X 5 MM MISC USE TO INJECT LANTUS ONCE A DAY AND NOVOLOG TWICE DAILY. (TOTAL OF 3 INJECTIONS DAILY) 100 each 2   Calcium 500 MG tablet Take 1 tablet (500 mg total) by mouth 2 (two) times daily. 100 tablet 2   Cholecalciferol (VITAMIN D3) 10 MCG (400 UNIT) tablet Take 2 tablets (800 Units total) by mouth daily. 100 tablet 8   insulin aspart (NOVOLOG FLEXPEN) 100 UNIT/ML FlexPen 8 units with breakfast and dinner 15 mL 11   losartan (COZAAR) 50 MG tablet Take 1 tablet (50 mg total) by mouth daily. 90 tablet 2   spironolactone (ALDACTONE) 25 MG tablet TAKE 1/2 TABLET BY MOUTH EVERY DAY 45 tablet 1   No current facility-administered medications on file prior to visit.    Allergies  Allergen Reactions   Shrimp [Shellfish Allergy] Shortness Of Breath and Other (See Comments)    Sneezing, also   Beef-Derived Products Other (See Comments)    Generalized "burning sensation"   Fruit & Vegetable Daily [Nutritional Supplements] Other (See Comments)    Oranges cause lower extremity burning   Metformin And Related Diarrhea   Norvasc [Amlodipine] Swelling and Other (See Comments)    Edema of the lower extremities    Social History   Socioeconomic History   Marital status: Widowed    Spouse name: Not on file   Number of children: Not on file   Years of education: Not on file   Highest education level: Not on file  Occupational History   Occupation: retired  Tobacco Use   Smoking status: Never   Smokeless tobacco: Never  Substance and Sexual Activity   Alcohol use: No   Drug use: No   Sexual activity: Not on file  Other Topics Concern   Not on file  Social History Narrative   Not on file   Social Determinants of Health   Financial Resource Strain: Not on file  Food Insecurity: Not on file  Transportation Needs:  Not on file  Physical Activity: Not on file  Stress: Not on file  Social Connections: Not on file  Intimate Partner Violence: Not on file    Family History  Problem Relation Age of Onset   Heart disease Maternal Uncle 36    Past Surgical History:  Procedure Laterality Date   ABDOMINAL HYSTERECTOMY     CHOLECYSTECTOMY N/A 05/02/2014   Procedure: LAPAROSCOPIC CHOLECYSTECTOMY ;  Surgeon: Coralie Keens, MD;  Location: [facility];  Service: General;  Laterality: N/A;  laparoscopic cholecystectomy   COLONOSCOPY     VIDEO BRONCHOSCOPY Bilateral 01/10/2016   Procedure: VIDEO BRONCHOSCOPY WITHOUT FLUORO;  Surgeon: Collene Gobble, MD;  Location: [facility];  Service: Cardiopulmonary;  Laterality: Bilateral;    ROS: Review of  Systems Negative except as stated above  PHYSICAL EXAM: BP (!) 171/78   Pulse 68   Ht 4' 10"  (1.473 m)   Wt 105 lb (47.6 kg)   LMP 05/11/2016   SpO2 100%   BMI 21.95 kg/m   Wt Readings from Last 3 Encounters:  12/26/21 105 lb (47.6 kg)  12/01/21 106 lb (48.1 kg)  07/15/21 106 lb 12.8 oz (48.4 kg)  BP 170/60, P 68  Physical Exam  General appearance -frail elderly female sitting in wheelchair in NAD. Mental status -patient answers most questions appropriately. Mouth -oral mucosa is moist. Chest -chronic fine crackles at the bases that clear with repeat inspiration and expiration. Heart - normal rate, regular rhythm, normal S1, S2, 2/6 systolic ejection murmur left sternal border. Extremities - 1+ BL LE edema     Latest Ref Rng & Units 12/01/2021    6:46 PM 07/15/2021    4:28 PM 06/17/2021    6:21 AM  CMP  Glucose 70 - 99 mg/dL 183  137  104   BUN 8 - 23 mg/dL 32  42  26   Creatinine 0.44 - 1.00 mg/dL 1.10  1.10  0.75   Sodium 135 - 145 mmol/L 136  135  130   Potassium 3.5 - 5.1 mmol/L 5.2  5.0  4.1   Chloride 98 - 111 mmol/L 102  100  101   CO2 22 - 32 mmol/L 24  23  24    Calcium 8.9 - 10.3 mg/dL 9.6  9.8  8.7   Total Protein 6.5 - 8.1 g/dL 6.5      Total Bilirubin 0.3 - 1.2 mg/dL 0.4     Alkaline Phos 38 - 126 U/L 83     AST 15 - 41 U/L 37     ALT 0 - 44 U/L 41      Lipid Panel     Component Value Date/Time   CHOL 133 10/29/2020 1621   TRIG 160 (H) 10/29/2020 1621   HDL 43 10/29/2020 1621   CHOLHDL 3.1 10/29/2020 1621   CHOLHDL 2.7 03/18/2019 0358   VLDL 13 03/18/2019 0358   LDLCALC 63 10/29/2020 1621    CBC    Component Value Date/Time   WBC 3.8 (L) 12/01/2021 1846   RBC 3.74 (L) 12/01/2021 1846   HGB 7.6 (L) 12/01/2021 1846   HGB 8.5 (L) 06/16/2021 1212   HGB 9.5 (L) 06/09/2021 1507   HCT 24.4 (L) 12/01/2021 1846   HCT 29.1 (L) 06/09/2021 1507   PLT 94 (L) 12/01/2021 1846   PLT 81 (L) 06/16/2021 1212   PLT 82 (LL) 06/09/2021 1507   MCV 65.2 (L) 12/01/2021 1846   MCV 73 (L) 06/09/2021 1507   MCH 20.3 (L) 12/01/2021 1846   MCHC 31.1 12/01/2021 1846   RDW 14.5 12/01/2021 1846   RDW 24.6 (H) 06/09/2021 1507   LYMPHSABS 0.9 12/01/2021 1846   LYMPHSABS 0.8 06/09/2021 1507   MONOABS 0.3 12/01/2021 1846   EOSABS 0.2 12/01/2021 1846   EOSABS 0.2 06/09/2021 1507   BASOSABS 0.0 12/01/2021 1846   BASOSABS 0.0 06/09/2021 1507    ASSESSMENT AND PLAN: 1. Type 2 diabetes mellitus with diabetic polyneuropathy, with long-term current use of insulin ([place]) At goal despite not having Lantus insulin.  She appears to be maintaining on NovoLog with twice daily dosing.  We will have her continue with this. - POCT glycosylated hemoglobin (Hb A1C) - POCT glucose (manual entry) - Ambulatory referral to Ophthalmology  2. Essential  hypertension Not at goal.  Pulse rate a little low so I did not add carvedilol.  We will add hydralazine instead. - hydrALAZINE (APRESOLINE) 10 MG tablet; Take 1 tablet (10 mg total) by mouth in the morning and at bedtime.  Dispense: 60 tablet; Refill: 6  3. Chronic anemia We will get her back in with Dr. [name].  Restart iron supplement - Ambulatory referral to Hematology / Oncology - iron  polysaccharides (NIFEREX) 150 MG capsule; Take 1 capsule (150 mg total) by mouth daily.  Dispense: 90 capsule; Refill: 1 - Ambulatory referral to Gastroenterology  4. Syncope and collapse Could be due to anemia but this was the second time that patient had sudden syncope with severe injuries.  Will refer to cardiology to be considered for monitor. - Ambulatory referral to Cardiology  5. Other osteoporosis, unspecified pathological fracture presence Continue Fosamax, vitamin D and calcium supplement  6. Screening for colon cancer - Ambulatory referral to Gastroenterology  7. Renal insufficiency - Basic Metabolic Panel   Patient was given the opportunity to ask questions.  Patient verbalized understanding of the plan and was able to repeat key elements of the plan.   This documentation was completed using Radio producer.  Any transcriptional errors are unintentional.  Orders Placed This Encounter  Procedures   Basic Metabolic Panel   Ambulatory referral to Hematology / Oncology   Ambulatory referral to Ophthalmology   Ambulatory referral to Gastroenterology   Ambulatory referral to Cardiology   POCT glycosylated hemoglobin (Hb A1C)   POCT glucose (manual entry)     Requested Prescriptions   Signed Prescriptions Disp Refills   iron polysaccharides (NIFEREX) 150 MG capsule 90 capsule 1    Sig: Take 1 capsule (150 mg total) by mouth daily.   hydrALAZINE (APRESOLINE) 10 MG tablet 60 tablet 6    Sig: Take 1 tablet (10 mg total) by mouth in the morning and at bedtime.    Return in about 3 months (around 03/28/2022) for Appt with [facility] in 3 wks for BP check.  Karle Plumber, MD, FACP

## 2021-12-27 LAB — BASIC METABOLIC PANEL
BUN/Creatinine Ratio: 30 — ABNORMAL HIGH (ref 12–28)
BUN: 28 mg/dL — ABNORMAL HIGH (ref 8–27)
CO2: 21 mmol/L (ref 20–29)
Calcium: 8.9 mg/dL (ref 8.7–10.3)
Chloride: 104 mmol/L (ref 96–106)
Creatinine, Ser: 0.94 mg/dL (ref 0.57–1.00)
Glucose: 100 mg/dL — ABNORMAL HIGH (ref 70–99)
Potassium: 4.8 mmol/L (ref 3.5–5.2)
Sodium: 138 mmol/L (ref 134–144)
eGFR: 64 mL/min/{1.73_m2} (ref >59)

## 2021-12-29 ENCOUNTER — Telehealth: Payer: Self-pay | Admitting: Hematology

## 2021-12-29 NOTE — Telephone Encounter (Signed)
Scheduled appt per 8/4 referral. Called pt, no answer. Left msg with appt date/time. Mailed updated calendar to pt.

## 2022-01-30 ENCOUNTER — Ambulatory Visit: Payer: Medicare Other | Attending: Internal Medicine | Admitting: Pharmacist

## 2022-01-30 ENCOUNTER — Encounter: Payer: Self-pay | Admitting: Pharmacist

## 2022-01-30 DIAGNOSIS — E1142 Type 2 diabetes mellitus with diabetic polyneuropathy: Secondary | ICD-10-CM | POA: Diagnosis not present

## 2022-01-30 DIAGNOSIS — I10 Essential (primary) hypertension: Secondary | ICD-10-CM | POA: Insufficient documentation

## 2022-01-30 DIAGNOSIS — Z0130 Encounter for examination of blood pressure without abnormal findings: Secondary | ICD-10-CM | POA: Insufficient documentation

## 2022-01-30 DIAGNOSIS — E1159 Type 2 diabetes mellitus with other circulatory complications: Secondary | ICD-10-CM

## 2022-01-30 DIAGNOSIS — R600 Localized edema: Secondary | ICD-10-CM

## 2022-01-30 DIAGNOSIS — Z79899 Other long term (current) drug therapy: Secondary | ICD-10-CM | POA: Insufficient documentation

## 2022-01-30 DIAGNOSIS — I152 Hypertension secondary to endocrine disorders: Secondary | ICD-10-CM

## 2022-01-30 DIAGNOSIS — Z794 Long term (current) use of insulin: Secondary | ICD-10-CM

## 2022-01-30 MED ORDER — LOSARTAN POTASSIUM 50 MG PO TABS: 50.0000 mg | ORAL_TABLET | Freq: Every day | ORAL | 2 refills | Status: DC

## 2022-01-30 MED ORDER — BD PEN NEEDLE MINI U/F 31G X 5 MM MISC: 2 refills | Status: DC

## 2022-01-30 MED ORDER — SPIRONOLACTONE 25 MG PO TABS: 12.5000 mg | ORAL_TABLET | Freq: Every day | ORAL | 1 refills | Status: AC

## 2022-01-30 MED ORDER — NOVOLOG FLEXPEN 100 UNIT/ML ~~LOC~~ SOPN: PEN_INJECTOR | SUBCUTANEOUS | 11 refills | Status: DC

## 2022-01-30 NOTE — Progress Notes (Signed)
   S:    PCP: Dr. Laural Benes  Patient presents to the clinic for hypertension evaluation, counseling, and management. Patient seen by Dr. Laural Benes on 12/26/2021 and BP was 171/78 mmHg. She was started on hydralazine 10 mg BID.   Today, patient presents accompanied by her daughter and an in person [facility] interpreter. She ambulates with use of a cane.   She brings her medications with me today. Of note, her losartan bottle is empty. Her daughter tells me she has been without this medication for a couple of days now. She is adherent to her losartan and spironolatone and has taken them today.   Current BP Medications include:  hydralazine 10 mg BID, losartan 50 mg daily (ran out ~2 days ago), spironolactone 12.5 mg daily  Previous BP Medications tried: amlodipine (LEE), HCTZ (hyponatremia)  Dietary habits include: reports that she does not use salt; denies drinking caffeine  Exercise habits include: walks daily with the use of her cane but is unable to exercise   Family / Social history:  Fhx: no known positives  Tobacco: non-smoker Alcohol: none reported   O:  Vitals:   01/30/22 1451  BP: (!) 207/78     Last 3 Office BP readings: BP Readings from Last 3 Encounters:  01/30/22 (!) 207/78  12/26/21 (!) 171/78  12/02/21 (!) 180/63    BMET    Component Value Date/Time   NA 138 12/26/2021 1045   K 4.8 12/26/2021 1045   CL 104 12/26/2021 1045   CO2 21 12/26/2021 1045   GLUCOSE 100 (H) 12/26/2021 1045   GLUCOSE 183 (H) 12/01/2021 1846   BUN 28 (H) 12/26/2021 1045   CREATININE 0.94 12/26/2021 1045   CREATININE 1.10 (H) 06/16/2021 1212   CREATININE 0.66 06/19/2016 0838   CALCIUM 8.9 12/26/2021 1045   GFRNONAA 53 (L) 12/01/2021 1846   GFRNONAA 53 (L) 06/16/2021 1212   GFRNONAA >89 12/30/2015 1210   GFRAA 61 08/31/2019 1143   GFRAA >89 12/30/2015 1210    Renal function: CrCl cannot be calculated (Patient's most recent lab result is older than the maximum 21 days  allowed.).  Clinical ASCVD: No  The ASCVD Risk score (Arnett DK, et al., 2019) failed to calculate for the following reasons:   The valid systolic blood pressure range is 90 to 200 mmHg   A/P: Hypertension longstanding currently above goal on current medications. Medication adherence denied. She has been without her losartan for a couple of days. Adherence encouraged. I have also sent refills for her to her pharmacy. BP Goal = < 130/80 mmHg.  -Continue current medications -Counseled on lifestyle modifications for blood pressure control including reduced dietary sodium, increased exercise, adequate sleep.  Total time in face-to-face counseling 30 minutes. F/U Clinic Visit with me in 1 month.  Butch Penny, PharmD, [place]y [place], CPP Clinical Pharmacist [facility] & [facility] [phone]

## 2022-02-18 ENCOUNTER — Inpatient Hospital Stay: Payer: Medicare Other | Attending: Hematology | Admitting: Hematology

## 2022-03-03 ENCOUNTER — Ambulatory Visit: Payer: Medicare Other | Attending: Internal Medicine | Admitting: Pharmacist

## 2022-03-03 ENCOUNTER — Encounter: Payer: Self-pay | Admitting: Pharmacist

## 2022-03-03 VITALS — BP 196/76

## 2022-03-03 DIAGNOSIS — I10 Essential (primary) hypertension: Secondary | ICD-10-CM | POA: Insufficient documentation

## 2022-03-03 MED ORDER — LOSARTAN POTASSIUM 100 MG PO TABS: 100.0000 mg | ORAL_TABLET | Freq: Every day | ORAL | 1 refills | Status: DC

## 2022-03-03 NOTE — Progress Notes (Signed)
   S:    PCP: Dr. Wynetta Emery  Patient presents to the clinic for hypertension evaluation, counseling, and management. Patient seen by Dr. Wynetta Emery on 12/26/2021 and BP was 171/78 mmHg. She was started on hydralazine 10 mg BID. I saw her on 01/30/2022. Her BP was extremely elevated secondary to running out of her losartan.   Today, patient presents accompanied by her son-in-law.  She brings her medications with her. She endorses adherence. Her bottles seem up to date and she has taken all three antihypertensives so far today.   Current BP Medications include:  hydralazine 10 mg BID, losartan 50 mg daily, spironolactone 12.5 mg daily  Previous BP Medications tried: amlodipine (LEE), HCTZ (hyponatremia)  Dietary habits include: reports that she does use salt; denies drinking caffeine  Exercise habits include: walks daily with the use of her cane but is unable to exercise   Family / Social history:  Fhx: no known positives  Tobacco: non-smoker Alcohol: none reported   Reported home blood pressure:  SBP: 170s DBP: 70s  O:  Vitals:   03/03/22 1652  BP: (!) 196/76    Last 3 Office BP readings: BP Readings from Last 3 Encounters:  03/03/22 (!) 196/76  01/30/22 (!) 207/78  12/26/21 (!) 171/78    BMET    Component Value Date/Time   NA 138 12/26/2021 1045   K 4.8 12/26/2021 1045   CL 104 12/26/2021 1045   CO2 21 12/26/2021 1045   GLUCOSE 100 (H) 12/26/2021 1045   GLUCOSE 183 (H) 12/01/2021 1846   BUN 28 (H) 12/26/2021 1045   CREATININE 0.94 12/26/2021 1045   CREATININE 1.10 (H) 06/16/2021 1212   CREATININE 0.66 06/19/2016 0838   CALCIUM 8.9 12/26/2021 1045   GFRNONAA 53 (L) 12/01/2021 1846   GFRNONAA 53 (L) 06/16/2021 1212   GFRNONAA >89 12/30/2015 1210   GFRAA 61 08/31/2019 1143   GFRAA >89 12/30/2015 1210    Renal function: CrCl cannot be calculated (Patient's most recent lab result is older than the maximum 21 days allowed.).  Clinical ASCVD: No  The 10-year ASCVD risk  score (Arnett DK, et al., 2019) is: 55.8%   Values used to calculate the score:     Age: 31 years     Sex: Female     Is Non-Hispanic African American: No     Diabetic: Yes     Tobacco smoker: No     Systolic Blood Pressure: 944 mmHg     Is BP treated: Yes     HDL Cholesterol: 43 mg/dL     Total Cholesterol: 133 mg/dL   A/P: Hypertension longstanding currently above goal on current medications. Medication adherence reported. SBP Goal = < 130 mmHg.  -Increase losartan to 100 mg daily.  -Continue spironolactone 12.5 mg daily.  -Continue hydralazine 10 mg BID.  -Counseled on lifestyle modifications for blood pressure control including reduced dietary sodium, increased exercise, adequate sleep.  Total time in face-to-face counseling 30 minutes. F/U Clinic Visit with PCP 03/31/2022.  Benard Halsted, PharmD, [place], [place] [phone]

## 2022-03-19 ENCOUNTER — Emergency Department (HOSPITAL_COMMUNITY)
Admission: EM | Admit: 2022-03-19 | Discharge: 2022-03-20 | Disposition: A | Discharge status: Home / Self Care | Payer: Medicare Other | Attending: Emergency Medicine | Admitting: Emergency Medicine

## 2022-03-19 ENCOUNTER — Emergency Department (HOSPITAL_COMMUNITY): Payer: Medicare Other

## 2022-03-19 ENCOUNTER — Other Ambulatory Visit: Payer: Self-pay

## 2022-03-19 ENCOUNTER — Encounter (HOSPITAL_COMMUNITY): Payer: Self-pay

## 2022-03-19 DIAGNOSIS — E119 Type 2 diabetes mellitus without complications: Secondary | ICD-10-CM | POA: Diagnosis not present

## 2022-03-19 DIAGNOSIS — Z79899 Other long term (current) drug therapy: Secondary | ICD-10-CM | POA: Insufficient documentation

## 2022-03-19 DIAGNOSIS — I10 Essential (primary) hypertension: Secondary | ICD-10-CM | POA: Diagnosis not present

## 2022-03-19 DIAGNOSIS — M79605 Pain in left leg: Secondary | ICD-10-CM

## 2022-03-19 DIAGNOSIS — Z794 Long term (current) use of insulin: Secondary | ICD-10-CM | POA: Diagnosis not present

## 2022-03-19 DIAGNOSIS — M79672 Pain in left foot: Secondary | ICD-10-CM | POA: Insufficient documentation

## 2022-03-19 DIAGNOSIS — E871 Hypo-osmolality and hyponatremia: Secondary | ICD-10-CM | POA: Insufficient documentation

## 2022-03-19 LAB — CBG MONITORING, ED: Glucose-Capillary: 114 mg/dL — ABNORMAL HIGH (ref 70–99)

## 2022-03-19 MED ORDER — GABAPENTIN 300 MG PO CAPS
300.0000 mg | ORAL_CAPSULE | Freq: Once | ORAL | Status: AC
Start: 2022-03-20 — End: 2022-03-20
  Administered 2022-03-20: 300 mg via ORAL
  Filled 2022-03-19: qty 1

## 2022-03-19 MED ORDER — HYDROCODONE-ACETAMINOPHEN 5-325 MG PO TABS
1.0000 | ORAL_TABLET | Freq: Once | ORAL | Status: AC
  Administered 2022-03-19: 1 via ORAL
  Filled 2022-03-19: qty 1

## 2022-03-19 NOTE — ED Triage Notes (Addendum)
Pt BIB EMS from home. Pt c/o chronic left foot pain, seen multiple times for it. Pt family want pt to have stronger pain meds. Pt is diabetic. EMS  states patient walked down the stairs and sat on the stretcher.   CBG 89

## 2022-03-19 NOTE — ED Provider Triage Note (Signed)
Emergency Medicine Provider Triage Evaluation Note  Amrutha Avera , a 76 y.o. female  was evaluated in triage.  Pt complains of left foot pain for the past 2 years.  Family friend is Optometrist at bedside.  Level 5 caveat.  Intermittent left foot pain for the past 2 years sometimes improved with Tylenol sometimes not.  No new trauma or injury.  No open wounds.  No fever, chills, nausea or vomiting.  No focal weakness, numbness or tingling..  Review of Systems  Positive: Left foot pain Negative: Fever, vomiting, open wounds  Physical Exam  BP (!) 185/70   Pulse 70   Temp (!) 96.4 F (35.8 C) (Rectal)   Resp 18   LMP 05/11/2016   SpO2 98%  Gen:   Awake, no distress   Resp:  Normal effort  MSK:   Moves extremities without difficulty  Other:  Left foot normal to inspection.  No open wounds.  Intact DP and PT pulse  Medical Decision Making  Medically screening exam initiated at 10:33 PM.  Appropriate orders placed.  Joelene Barriere was informed that the remainder of the evaluation will be completed by another provider, this initial triage assessment does not replace that evaluation, and the importance of remaining in the ED until their evaluation is complete.  CBG, x-ray, labs   Ezequiel Essex, MD 03/19/22 2234

## 2022-03-20 DIAGNOSIS — M79672 Pain in left foot: Secondary | ICD-10-CM | POA: Diagnosis not present

## 2022-03-20 LAB — CBC WITH DIFFERENTIAL/PLATELET
Abs Immature Granulocytes: 0.05 10*3/uL (ref 0.00–0.07)
Basophils Absolute: 0 10*3/uL (ref 0.0–0.1)
Basophils Relative: 0 %
Eosinophils Absolute: 0.2 10*3/uL (ref 0.0–0.5)
Eosinophils Relative: 2 %
HCT: 23.1 % — ABNORMAL LOW (ref 36.0–46.0)
Hemoglobin: 7.6 g/dL — ABNORMAL LOW (ref 12.0–15.0)
Immature Granulocytes: 1 %
Lymphocytes Relative: 12 %
Lymphs Abs: 1.1 10*3/uL (ref 0.7–4.0)
MCH: 20.8 pg — ABNORMAL LOW (ref 26.0–34.0)
MCHC: 32.9 g/dL (ref 30.0–36.0)
MCV: 63.1 fL — ABNORMAL LOW (ref 80.0–100.0)
Monocytes Absolute: 0.5 10*3/uL (ref 0.1–1.0)
Monocytes Relative: 6 %
Neutro Abs: 6.7 10*3/uL (ref 1.7–7.7)
Neutrophils Relative %: 79 %
Platelets: 109 10*3/uL — ABNORMAL LOW (ref 150–400)
RBC: 3.66 MIL/uL — ABNORMAL LOW (ref 3.87–5.11)
RDW: 15.7 % — ABNORMAL HIGH (ref 11.5–15.5)
WBC: 8.6 10*3/uL (ref 4.0–10.5)
nRBC: 0 % (ref 0.0–0.2)

## 2022-03-20 LAB — C-REACTIVE PROTEIN: CRP: <0.5 mg/dL (ref <1.0)

## 2022-03-20 LAB — COMPREHENSIVE METABOLIC PANEL
ALT: 45 U/L — ABNORMAL HIGH (ref 0–44)
AST: 28 U/L (ref 15–41)
Albumin: 3.9 g/dL (ref 3.5–5.0)
Alkaline Phosphatase: 66 U/L (ref 38–126)
Anion gap: 8 (ref 5–15)
BUN: 34 mg/dL — ABNORMAL HIGH (ref 8–23)
CO2: 23 mmol/L (ref 22–32)
Calcium: 9.6 mg/dL (ref 8.9–10.3)
Chloride: 93 mmol/L — ABNORMAL LOW (ref 98–111)
Creatinine, Ser: 1.02 mg/dL — ABNORMAL HIGH (ref 0.44–1.00)
GFR, Estimated: 58 mL/min — ABNORMAL LOW (ref >60)
Glucose, Bld: 114 mg/dL — ABNORMAL HIGH (ref 70–99)
Potassium: 5 mmol/L (ref 3.5–5.1)
Sodium: 124 mmol/L — ABNORMAL LOW (ref 135–145)
Total Bilirubin: 0.7 mg/dL (ref 0.3–1.2)
Total Protein: 7.5 g/dL (ref 6.5–8.1)

## 2022-03-20 LAB — SEDIMENTATION RATE: Sed Rate: 38 mm/hr — ABNORMAL HIGH (ref 0–22)

## 2022-03-20 LAB — LACTIC ACID, PLASMA: Lactic Acid, Venous: 1 mmol/L (ref 0.5–1.9)

## 2022-03-20 MED ORDER — GABAPENTIN 100 MG PO CAPS: 200.0000 mg | ORAL_CAPSULE | Freq: Every day | ORAL | 0 refills | Status: DC

## 2022-03-20 MED ORDER — SODIUM CHLORIDE 0.9 % IV BOLUS
500.0000 mL | Freq: Once | INTRAVENOUS | Status: AC
  Administered 2022-03-20: 500 mL via INTRAVENOUS

## 2022-03-20 NOTE — Discharge Instructions (Addendum)
Lab work shows that you have a low sodium, please remember to stay hydrated, you may drink Gatorade will help replenish her sodium levels, I would like you to follow-up with your primary care doctor next 2 days for repeat evaluation. Like pain-I suspect this is from your diabetes, given you gabapentin please take as prescribed please follow-up with your PCP for further evaluation.  Come back to the emergency department if you develop chest pain, shortness of breath, severe abdominal pain, uncontrolled nausea, vomiting, diarrhea.

## 2022-03-20 NOTE — ED Notes (Addendum)
  Patient ready for discharge but interpreter left around midnight.  Called all the numbers listed in chart for someone to come pick her up, some multiple times all with no answer.  Will [employer] [facility], and [employer] notified.

## 2022-03-20 NOTE — ED Provider Notes (Signed)
[facility] DEPT Provider Note   CSN: [id number] Arrival date & time: 03/19/22  2130     History  Chief Complaint  Patient presents with   Left Foot Pain    Olivia Williamson is a 76 y.o. female.  HPI   Patient with medical history including hypertension, diabetes, GERD presents with complaints of bilateral leg pain, been going on for last 2 years time, states its remained unchanged, states he felt slightly worse yesterday but feels fine now, there is been no recent trauma to it, no leg swelling, no no chest pain no shortness of breath no history PEs or DVTs.  Patient states that she is taking Tylenol without much relief, she is only complaint at this time.  HPI was collected by using a interpreter.  Home Medications Prior to Admission medications   Medication Sig Start Date End Date Taking? Authorizing Provider  gabapentin (NEURONTIN) 100 MG capsule Take 2 capsules (200 mg total) by mouth daily for 14 days. 03/20/22 04/03/22 Yes Marcello Fennel, PA-C  alendronate (FOSAMAX) 70 MG tablet Take 1 tablet (70 mg total) by mouth every 7 (seven) days. Take with a full glass of water on an empty stomach. 12/01/21   [name], MD  b complex vitamins capsule Take 1 capsule by mouth daily. Patient taking differently: Take 1 capsule by mouth every morning. 06/16/21   Brunetta Genera, MD  Calcium 500 MG tablet Take 1 tablet (500 mg total) by mouth 2 (two) times daily. 12/01/21   [name], MD  Cholecalciferol (VITAMIN D3) 10 MCG (400 UNIT) tablet Take 2 tablets (800 Units total) by mouth daily. 12/01/21   [name], MD  hydrALAZINE (APRESOLINE) 10 MG tablet Take 1 tablet (10 mg total) by mouth in the morning and at bedtime. 12/26/21   [name], MD  insulin aspart (NOVOLOG FLEXPEN) 100 UNIT/ML FlexPen 8 units with breakfast and dinner 01/30/22   [name], MD  Insulin Pen Needle (B-D UF III MINI PEN NEEDLES) 31G X 5 MM MISC Use  to inject Novolog twice a day. 01/30/22   [name], MD  iron polysaccharides (NIFEREX) 150 MG capsule Take 1 capsule (150 mg total) by mouth daily. 12/26/21   [name], MD  losartan (COZAAR) 100 MG tablet Take 1 tablet (100 mg total) by mouth daily. 03/03/22   [name], MD  spironolactone (ALDACTONE) 25 MG tablet Take 0.5 tablets (12.5 mg total) by mouth daily. 01/30/22   [name], MD      Allergies    Shrimp [shellfish allergy], Beef-derived products, Fruit & vegetable daily [nutritional supplements], Metformin and related, and Norvasc [amlodipine]    Review of Systems   Review of Systems  Constitutional:  Negative for chills and fever.  Respiratory:  Negative for shortness of breath.   Cardiovascular:  Negative for chest pain.  Gastrointestinal:  Negative for abdominal pain.  Neurological:  Negative for headaches.    Physical Exam Updated Vital Signs BP (!) 155/68   Pulse 69   Temp (!) 97.4 F (36.3 C) (Oral)   Resp 18   LMP 05/11/2016   SpO2 96%  Physical Exam Vitals and nursing note reviewed.  Constitutional:      General: She is not in acute distress.    Appearance: She is not ill-appearing.  HENT:     Head: Normocephalic and atraumatic.     Nose: No congestion.  Eyes:  Extraocular Movements: Extraocular movements intact.     Conjunctiva/sclera: Conjunctivae normal.     Pupils: Pupils are equal, round, and reactive to light.  Cardiovascular:     Rate and Rhythm: Normal rate and regular rhythm.     Pulses: Normal pulses.  Pulmonary:     Effort: Pulmonary effort is normal.  Abdominal:     Palpations: Abdomen is soft.     Tenderness: There is no abdominal tenderness. There is no right CVA tenderness or left CVA tenderness.  Musculoskeletal:     Comments: Spine was palpated was nontender to palpation no step-off deformities noted, no overlying skin changes, no pelvis instability no leg shortening, focused exam of the lower legs  were unremarkable no evidence of ischemic leg, no edema, no erythema, no calf tenderness no palpable cords, moving her toes ankle knee and hips without difficulty, sensation intact to light touch, 2+ dorsal pedal pulses, 2-second capillary refill.  Skin:    General: Skin is warm and dry.  Neurological:     Mental Status: She is alert.     Comments: No facial asymmetry no difficulty with word finding following two-step commands no unilateral weakness present.  Psychiatric:        Mood and Affect: Mood normal.     ED Results / Procedures / Treatments   Labs (all labs ordered are listed, but only abnormal results are displayed) Labs Reviewed  CBC WITH DIFFERENTIAL/PLATELET - Abnormal; Notable for the following components:      Result Value   RBC 3.66 (*)    Hemoglobin 7.6 (*)    HCT 23.1 (*)    MCV 63.1 (*)    MCH 20.8 (*)    RDW 15.7 (*)    Platelets 109 (*)    All other components within normal limits  COMPREHENSIVE METABOLIC PANEL - Abnormal; Notable for the following components:   Sodium 124 (*)    Chloride 93 (*)    Glucose, Bld 114 (*)    BUN 34 (*)    Creatinine, Ser 1.02 (*)    ALT 45 (*)    GFR, Estimated 58 (*)    All other components within normal limits  SEDIMENTATION RATE - Abnormal; Notable for the following components:   Sed Rate 38 (*)    All other components within normal limits  CBG MONITORING, ED - Abnormal; Notable for the following components:   Glucose-Capillary 114 (*)    All other components within normal limits  LACTIC ACID, PLASMA  C-REACTIVE PROTEIN    EKG None  Radiology DG Foot Complete Left  Result Date: 03/19/2022 CLINICAL DATA:  Chronic foot pain, anterior surface of foot. EXAM: LEFT FOOT - COMPLETE 3+ VIEW COMPARISON:  12/01/2021. FINDINGS: There is diffusely decreased mineralization of the bones. No acute fracture or dislocation. No significant arthropathy. The soft tissues are within normal limits. IMPRESSION: No acute osseous  abnormality. Electronically Signed   By: Thornell Sartorius M.D.   On: 03/19/2022 22:51    Procedures Procedures    Medications Ordered in ED Medications  HYDROcodone-acetaminophen (NORCO/VICODIN) 5-325 MG per tablet 1 tablet (1 tablet Oral Given 03/19/22 2311)  gabapentin (NEURONTIN) capsule 300 mg (300 mg Oral Given 03/20/22 0054)  sodium chloride 0.9 % bolus 500 mL (0 mLs Intravenous Stopped 03/20/22 0156)    ED Course/ Medical Decision Making/ A&P                           Medical  Decision Making Risk Prescription drug management.   This patient presents to the ED for concern of leg pain, this involves an extensive number of treatment options, and is a complaint that carries with it a high risk of complications and morbidity.  The differential diagnosis includes limb ischemia, spine equina, DVT, cellulitis, compartment syndrome, fracture    Additional history obtained:  Additional history obtained from translator bedside External records from outside source obtained and reviewed including PCP notes   Co morbidities that complicate the patient evaluation  Diabetes  Social Determinants of Health:  Not English-speaking    Lab Tests:  I Ordered, and personally interpreted labs.  The pertinent results include: CBC shows stable microcytic anemia hemoglobin 7.6, CMP shows hyponatremia of 124 glucose 114 BUN 34 creatinine 1.02 GFR 58, sed rate is 38   Imaging Studies ordered:  I ordered imaging studies including x-ray of the left foot I independently visualized and interpreted imaging which showed no acute findings I agree with the radiologist interpretation   Cardiac Monitoring:  The patient was maintained on a cardiac monitor.  I personally viewed and interpreted the cardiac monitored which showed an underlying rhythm of: N/A   Medicines ordered and prescription drug management:  I ordered medication including gabapentin I have reviewed the patients home  medicines and have made adjustments as needed  Critical Interventions:  N/A   Reevaluation:  Presents with leg pain, benign physical exam, suspect this acute on chronic, will obtain basic lab work-up imaging and reassess  Resting comfortably having no complaints agreement with discharge at this time.   Consultations Obtained:  N/a   Test Considered:  N/a   Rule out Suspicion for fracture dislocation is low at this time there is no deformities present, there is no associated injury, she is moving her toes ankle knee and hips without difficulty.  I doubt compartment syndrome as all compartments are soft, 2+ dorsal pedal pulses, sensation intact to light touch to second capillary refill.  I doubt limb ischemia no evidence of ischemia present on exam no skin changes, strong dorsal pedal pulses.  No system for DVT no calf tenderness no palpable cords, there is no unilateral leg swelling, presentation is atypical etiology.  I doubt spine equina no back pain, no red flag symptoms.    Dispostion and problem list  After consideration of the diagnostic results and the patients response to treatment, I feel that the patent would benefit from discharge.  Leg pain-acute on chronic pain, suspect secondary due to neuropathy, will attempt short course of gabapentin and refer to PCP for further management. Hyponatremia-appears that she has had this in the past, she is not showing any evidence of fatigue, or confusion, suspect this is likely from possible poor oral intake, gave her 500 cc of fluids in the ED, will have her follow-up with her PCP for further evaluation            Final Clinical Impression(s) / ED Diagnoses Final diagnoses:  Pain in both lower extremities  Hyponatremia    Rx / DC Orders ED Discharge Orders          Ordered    gabapentin (NEURONTIN) 100 MG capsule  Daily        03/20/22 0249              Carroll Sage, PA-C 03/20/22 0252     Zadie Rhine, MD 03/20/22 [phone]

## 2022-03-31 ENCOUNTER — Ambulatory Visit: Payer: Medicare Other | Attending: Internal Medicine | Admitting: Internal Medicine

## 2022-03-31 VITALS — BP 210/82 | HR 70 | Temp 98.3°F | Ht <= 58 in | Wt 101.0 lb

## 2022-03-31 DIAGNOSIS — E871 Hypo-osmolality and hyponatremia: Secondary | ICD-10-CM | POA: Diagnosis not present

## 2022-03-31 DIAGNOSIS — N1831 Chronic kidney disease, stage 3a: Secondary | ICD-10-CM | POA: Insufficient documentation

## 2022-03-31 DIAGNOSIS — D631 Anemia in chronic kidney disease: Secondary | ICD-10-CM | POA: Diagnosis not present

## 2022-03-31 DIAGNOSIS — Z8616 Personal history of COVID-19: Secondary | ICD-10-CM | POA: Insufficient documentation

## 2022-03-31 DIAGNOSIS — I152 Hypertension secondary to endocrine disorders: Secondary | ICD-10-CM

## 2022-03-31 DIAGNOSIS — E1159 Type 2 diabetes mellitus with other circulatory complications: Secondary | ICD-10-CM | POA: Diagnosis not present

## 2022-03-31 DIAGNOSIS — E1122 Type 2 diabetes mellitus with diabetic chronic kidney disease: Secondary | ICD-10-CM | POA: Diagnosis present

## 2022-03-31 DIAGNOSIS — D649 Anemia, unspecified: Secondary | ICD-10-CM

## 2022-03-31 DIAGNOSIS — Z794 Long term (current) use of insulin: Secondary | ICD-10-CM | POA: Insufficient documentation

## 2022-03-31 DIAGNOSIS — Z79899 Other long term (current) drug therapy: Secondary | ICD-10-CM | POA: Diagnosis not present

## 2022-03-31 DIAGNOSIS — I129 Hypertensive chronic kidney disease with stage 1 through stage 4 chronic kidney disease, or unspecified chronic kidney disease: Secondary | ICD-10-CM | POA: Diagnosis not present

## 2022-03-31 DIAGNOSIS — M5416 Radiculopathy, lumbar region: Secondary | ICD-10-CM | POA: Insufficient documentation

## 2022-03-31 DIAGNOSIS — M8000XA Age-related osteoporosis with current pathological fracture, unspecified site, initial encounter for fracture: Secondary | ICD-10-CM | POA: Insufficient documentation

## 2022-03-31 DIAGNOSIS — E1142 Type 2 diabetes mellitus with diabetic polyneuropathy: Secondary | ICD-10-CM

## 2022-03-31 DIAGNOSIS — Z23 Encounter for immunization: Secondary | ICD-10-CM | POA: Diagnosis not present

## 2022-03-31 DIAGNOSIS — M818 Other osteoporosis without current pathological fracture: Secondary | ICD-10-CM | POA: Diagnosis not present

## 2022-03-31 LAB — POCT GLYCOSYLATED HEMOGLOBIN (HGB A1C): HbA1c, POC (controlled diabetic range): 7.7 % — AB (ref 0.0–7.0)

## 2022-03-31 LAB — GLUCOSE, POCT (MANUAL RESULT ENTRY): POC Glucose: 282 mg/dl — AB (ref 70–99)

## 2022-03-31 MED ORDER — CALCIUM 500 MG PO TABS: 500.0000 mg | ORAL_TABLET | Freq: Two times a day (BID) | ORAL | 2 refills | Status: AC

## 2022-03-31 MED ORDER — HYDRALAZINE HCL 25 MG PO TABS: 25.0000 mg | ORAL_TABLET | Freq: Two times a day (BID) | ORAL | 6 refills | Status: DC

## 2022-03-31 MED ORDER — BD PEN NEEDLE MINI U/F 31G X 5 MM MISC: 6 refills | Status: AC

## 2022-03-31 MED ORDER — GABAPENTIN 100 MG PO CAPS: 200.0000 mg | ORAL_CAPSULE | Freq: Every day | ORAL | 6 refills | Status: AC

## 2022-03-31 MED ORDER — LEVEMIR FLEXPEN 100 UNIT/ML ~~LOC~~ SOPN: 4.0000 [IU] | PEN_INJECTOR | Freq: Every day | SUBCUTANEOUS | 11 refills | Status: AC

## 2022-03-31 MED ORDER — ALENDRONATE SODIUM 70 MG PO TABS: 70.0000 mg | ORAL_TABLET | ORAL | 1 refills | Status: AC

## 2022-03-31 MED ORDER — VITAMIN D3 10 MCG (400 UNIT) PO TABS: 800.0000 [IU] | ORAL_TABLET | Freq: Every day | ORAL | 8 refills | Status: AC

## 2022-03-31 NOTE — Progress Notes (Signed)
Patient ID: Olivia Williamson, female    DOB: 07-15-1945  MRN: [id number]  CC: Diabetes (DM & HTN f/u. Velta Addison to flu vax.)   Subjective: Olivia Williamson is a 76 y.o. female who presents for chronic ds management.  Her son and interpreter Y HIN from [employer] are with her. 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  The only medication that she does not have is the Fosamax.  DM: Results for orders placed or performed in visit on 03/31/22  POCT glucose (manual entry)  Result Value Ref Range   POC Glucose 282 (A) 70 - 99 mg/dl  POCT glycosylated hemoglobin (Hb A1C)  Result Value Ref Range   Hemoglobin A1C     HbA1c POC (<> result, manual entry)     HbA1c, POC (prediabetic range)     HbA1c, POC (controlled diabetic range) 7.7 (A) 0.0 - 7.0 %  -A1c several months ago was 6.7.  A1c today 7.7.  She is on NovoLog 8 units with 2 of her meals daily.  She reports checking blood sugars 3 times a day but has not recorded the readings.  She tells me that sometimes the blood sugars range from 100 to the 200s.  HTN: She has seen the clinical pharmacist a few times since last visit with me.  Medications have been adjusted.  She is currently on spironolactone 12.5 mg daily, Cozaar 100 mg daily and hydralazine 10 mg twice a day. She tries to limit salt in the foods.  Osteoporosis: She is taking the calcium 500 mg twice a day and vitamin D 800 IU daily.  She has these bottles with her.  She does not have the Fosamax.  Her son remembers the medication that she was taking once a week but states she has ran out of it and needs refill.  Recently seen in the emergency room with pain in her feet  thought to be likely due to diabetic neuropathy.  Started on gabapentin 200 mg nightly.  Patient states that the pain in her feet is much better.  Denies any numbness. -Blood test done on that ER visit showed sodium level to be low at 124.  Blood sugar was not elevated.  Patient was given some normal saline and sent home.  GFR was 58.  Chronic anemia: She is taking iron supplement.  She has not followed up with Dr. [name].  Recent CBC done through the ER shows that her hemoglobin is stable in the 7s range. Denies any dizziness or fatigue. 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 90 capsule 1   losartan (COZAAR) 100 MG tablet Take 1 tablet (100 mg total) by mouth daily. 90 tablet 1   spironolactone (ALDACTONE) 25 MG tablet Take 0.5 tablets (12.5 mg total) by mouth daily. 45 tablet 1   b complex vitamins capsule Take 1 capsule by mouth daily.  (Patient not taking: Reported on 03/31/2022) 30 capsule 11   insulin aspart (NOVOLOG FLEXPEN) 100 UNIT/ML FlexPen 8 units with breakfast and dinner (Patient not taking: Reported on 03/31/2022) 15 mL 11   No current facility-administered medications on file prior to visit.    Allergies  Allergen Reactions   Shrimp [Shellfish Allergy] Shortness Of Breath and Other (See Comments)    Sneezing, also   Beef-Derived Products Other (See Comments)    Generalized "burning sensation"   Fruit & Vegetable Daily [Nutritional Supplements] Other (See Comments)    Oranges cause lower extremity burning   Metformin And Related Diarrhea   Norvasc [Amlodipine] Swelling and Other (See Comments)    Edema of the lower extremities    Social History   Socioeconomic History   Marital status: Widowed    Spouse name: Not on file   Number of children: Not on file   Years of education: Not on file   Highest education level: Not on file  Occupational History   Occupation: retired  Tobacco Use   Smoking status: Never   Smokeless tobacco: Never  Substance and Sexual Activity   Alcohol use: No   Drug use: No   Sexual activity: Not on file  Other Topics Concern   Not on file  Social History Narrative   Not on file   Social Determinants of Health   Financial Resource Strain: Not on file  Food Insecurity: Not on file  Transportation Needs: Not on file  Physical Activity: Not on file  Stress: Not on file  Social Connections: Not on file  Intimate Partner Violence: Not on file    Family History  Problem Relation Age of Onset   Heart disease Maternal Uncle 48    Past Surgical History:  Procedure Laterality Date   ABDOMINAL HYSTERECTOMY     CHOLECYSTECTOMY N/A 05/02/2014   Procedure: LAPAROSCOPIC CHOLECYSTECTOMY ;  Surgeon: Abigail Miyamoto, MD;  Location: [facility] OR;  Service: General;  Laterality: N/A;  laparoscopic cholecystectomy   COLONOSCOPY     VIDEO BRONCHOSCOPY Bilateral 01/10/2016    Procedure: VIDEO BRONCHOSCOPY WITHOUT FLUORO;  Surgeon: Leslye Peer, MD;  [facility] ENDOSCOPY;  Service: Cardiopulmonary;  Laterality: Bilateral;    ROS: Review of Systems Negative except as stated above  PHYSICAL EXAM: BP (!) 210/82 (BP Location: Left Arm, Patient Position: Sitting, Cuff Size: Normal)   Pulse 70   Temp 98.3 F (36.8 C) (Oral)   Ht 4\' 10"  (1.473 m)   Wt 101 lb (45.8 kg)   LMP 05/11/2016   SpO2 99%   BMI 21.11 kg/m   Wt Readings from Last 3 Encounters:  03/31/22 101 lb (45.8 kg)  12/26/21 105 lb (47.6 kg)  12/01/21 106 lb (48.1 kg)    Physical Exam Repeat blood pressure is  209/72. General appearance - frail elderly pt sitting in WC in NAD.  Clothing clean Mental status - normal mood, behavior, speech, dress, motor activity, and thought processes Neck - supple, no significant adenopathy Mouth: moist oral mucosa Chest - clear to auscultation, no wheezes, rales or rhonchi, symmetric air entry Heart - normal rate, regular rhythm, normal S1, S2, no murmurs, rubs, clicks or gallops Extremities - no edema in legs at this time     Latest Ref Rng & Units 03/19/2022   11:20 PM 12/26/2021   10:45 AM 12/01/2021    6:46 PM  CMP  Glucose 70 - 99 mg/dL 607  371  062   BUN 8 - 23 mg/dL 34  28  32   Creatinine 0.44 - 1.00 mg/dL 6.94  8.54  6.27   Sodium 135 - 145 mmol/L 124  138  136   Potassium 3.5 - 5.1 mmol/L 5.0  4.8  5.2   Chloride 98 - 111 mmol/L 93  104  102   CO2 22 - 32 mmol/L 23  21  24    Calcium 8.9 - 10.3 mg/dL 9.6  8.9  9.6   Total Protein 6.5 - 8.1 g/dL 7.5   6.5   Total Bilirubin 0.3 - 1.2 mg/dL 0.7   0.4   Alkaline Phos 38 - 126 U/L 66   83   AST 15 - 41 U/L 28   37   ALT 0 - 44 U/L 45   41    Lipid Panel     Component Value Date/Time   CHOL 133 10/29/2020 1621   TRIG 160 (H) 10/29/2020 1621   HDL 43 10/29/2020 1621   CHOLHDL 3.1 10/29/2020 1621   CHOLHDL 2.7 03/18/2019 0358   VLDL 13 03/18/2019 0358   LDLCALC 63 10/29/2020 1621     CBC    Component Value Date/Time   WBC 8.6 03/19/2022 2320   RBC 3.66 (L) 03/19/2022 2320   HGB 7.6 (L) 03/19/2022 2320   HGB 8.5 (L) 06/16/2021 1212   HGB 9.5 (L) 06/09/2021 1507   HCT 23.1 (L) 03/19/2022 2320   HCT 29.1 (L) 06/09/2021 1507   PLT 109 (L) 03/19/2022 2320   PLT 81 (L) 06/16/2021 1212   PLT 82 (LL) 06/09/2021 1507   MCV 63.1 (L) 03/19/2022 2320   MCV 73 (L) 06/09/2021 1507   MCH 20.8 (L) 03/19/2022 2320   MCHC 32.9 03/19/2022 2320   RDW 15.7 (H) 03/19/2022 2320   RDW 24.6 (H) 06/09/2021 1507   LYMPHSABS 1.1 03/19/2022 2320   LYMPHSABS 0.8 06/09/2021 1507   MONOABS 0.5 03/19/2022 2320   EOSABS 0.2 03/19/2022 2320   EOSABS 0.2 06/09/2021 1507   BASOSABS 0.0 03/19/2022 2320   BASOSABS 0.0 06/09/2021 1507    ASSESSMENT AND PLAN: 1. Type 2 diabetes mellitus with peripheral neuropathy (HCC) Not at goal. We will add back a low-dose of long-acting insulin.  She was on Lantus in the past but her insurance stopped paying for it.  We will try Levemir 4 units at bedtime.  Continue NovoLog 8 units with the 2 largest meals of the day.  Encouraged her to bring blood sugar readings with her on subsequent visit - POCT glucose (manual entry) - POCT glycosylated hemoglobin (Hb A1C) - insulin detemir (LEVEMIR FLEXPEN) 100 UNIT/ML FlexPen; Inject 4 Units into the skin at bedtime.  Dispense: 15 mL; Refill: 11 - Insulin Pen Needle (B-D UF III MINI PEN NEEDLES) 31G X 5 MM MISC; Use to  inject Novolog twice a day.  Dispense: 100 each; Refill: 6  2. Hypertension associated with diabetes (HCC) Not at goal. Continue spironolactone 12.5 mg daily, Cozaar 100 mg daily.  Increase hydralazine to 25 mg twice a day.  Follow-up with clinical pharmacist in 2 weeks for repeat blood pressure check.  3. Other osteoporosis, unspecified pathological fracture presence Refill given on Fosamax.  I went over with her how to take the medication.  Continue calcium and vitamin D supplement -  alendronate (FOSAMAX) 70 MG tablet; Take 1 tablet (70 mg total) by mouth every 7 (seven) days. Take with a full glass of water on an empty stomach.  Dispense: 12 tablet; Refill: 1 - Calcium 500 MG tablet; Take 1 tablet (500 mg total) by mouth 2 (two) times daily.  Dispense: 100 tablet; Refill: 2 - Cholecalciferol (VITAMIN D3) 10 MCG (400 UNIT) tablet; Take 2 tablets (800 Units total) by mouth daily.  Dispense: 100 tablet; Refill: 8 - hydrALAZINE (APRESOLINE) 25 MG tablet; Take 1 tablet (25 mg total) by mouth in the morning and at bedtime. Dose increase  Dispense: 60 tablet; Refill: 6  4. Hyponatremia Hx of SIADH. Recheck BMP - Basic Metabolic Panel; Future  5. Chronic anemia Stable.  We have tried unsuccessfully to get her back in with Dr.Kale.  His office has tried contacting her.  Language barrier plays significant role.    6. Need for influenza vaccination - Flu Vaccine QUAD High Dose(Fluad)     Patient was given the opportunity to ask questions.  Patient verbalized understanding of the plan and was able to repeat key elements of the plan.   This documentation was completed using Paediatric nurse.  Any transcriptional errors are unintentional.  Orders Placed This Encounter  Procedures   Flu Vaccine QUAD High Dose(Fluad)   Basic Metabolic Panel   POCT glucose (manual entry)   POCT glycosylated hemoglobin (Hb A1C)     Requested Prescriptions   Signed Prescriptions Disp Refills   alendronate (FOSAMAX) 70 MG tablet 12 tablet 1    Sig: Take 1 tablet (70 mg total) by mouth every 7 (seven) days. Take with a full glass of water on an empty stomach.   Calcium 500 MG tablet 100 tablet 2    Sig: Take 1 tablet (500 mg total) by mouth 2 (two) times daily.   Cholecalciferol (VITAMIN D3) 10 MCG (400 UNIT) tablet 100 tablet 8    Sig: Take 2 tablets (800 Units total) by mouth daily.   insulin detemir (LEVEMIR FLEXPEN) 100 UNIT/ML FlexPen 15 mL 11    Sig: Inject 4 Units  into the skin at bedtime.   hydrALAZINE (APRESOLINE) 25 MG tablet 60 tablet 6    Sig: Take 1 tablet (25 mg total) by mouth in the morning and at bedtime. Dose increase   gabapentin (NEURONTIN) 100 MG capsule 60 capsule 6    Sig: Take 2 capsules (200 mg total) by mouth daily.   Insulin Pen Needle (B-D UF III MINI PEN NEEDLES) 31G X 5 MM MISC 100 each 6    Sig: Use to inject Novolog twice a day.    Return in about 3 months (around 07/01/2022) for Appt with [facility] in 2 wks for BP check.  Give lab appt for later this week.Jonah Blue, MD, FACP

## 2022-04-21 ENCOUNTER — Ambulatory Visit: Payer: Medicare Other | Attending: Internal Medicine | Admitting: Pharmacist

## 2022-04-21 ENCOUNTER — Encounter: Payer: Self-pay | Admitting: Pharmacist

## 2022-04-21 VITALS — BP 177/68

## 2022-04-21 DIAGNOSIS — Z79899 Other long term (current) drug therapy: Secondary | ICD-10-CM | POA: Insufficient documentation

## 2022-04-21 DIAGNOSIS — I152 Hypertension secondary to endocrine disorders: Secondary | ICD-10-CM | POA: Insufficient documentation

## 2022-04-21 DIAGNOSIS — E1159 Type 2 diabetes mellitus with other circulatory complications: Secondary | ICD-10-CM | POA: Diagnosis not present

## 2022-04-21 MED ORDER — HYDRALAZINE HCL 25 MG PO TABS: 25.0000 mg | ORAL_TABLET | Freq: Three times a day (TID) | ORAL | 0 refills | Status: AC

## 2022-04-21 MED ORDER — HYDRALAZINE HCL 25 MG PO TABS: 25.0000 mg | ORAL_TABLET | Freq: Three times a day (TID) | ORAL | 0 refills | Status: DC

## 2022-04-21 NOTE — Progress Notes (Signed)
   S:    PCP: Dr. Laural Benes  Patient presents to the clinic for hypertension evaluation, counseling, and management. Patient seen by Dr. Laural Benes on 03/31/2022 and BP was 210/82 mmHg. Her hydralazine dose was increased to 25 mg BID.   Today, patient presents accompanied by her daughter. I've seen her several times. Today during our interview, pt reveals that she manages her medications herself. Previously, I thought her daughter and/or son-in-law administered her medications at home, but this is not the case. She denies any missed doses recently, but she does have a hx of missing her medications.  She brings her medications with her. She endorses adherence. Her bottles seem up to date and she has taken all three antihypertensives so far today.   Current BP Medications include:  hydralazine 25 mg BID, losartan 100 mg daily, spironolactone 12.5 mg daily  Previous BP Medications tried: amlodipine (LEE), HCTZ (hyponatremia)  Dietary habits include: reports that she does use salt; denies drinking caffeine  Exercise habits include: walks daily with the use of her cane but is unable to exercise   Family / Social history:  Fhx: no known positives  Tobacco: non-smoker Alcohol: none reported   Reported home blood pressure:  SBP: 170s DBP: 70s  O:  Vitals:   04/21/22 1420  BP: (!) 177/68   Last 3 Office BP readings: BP Readings from Last 3 Encounters:  04/21/22 (!) 177/68  03/31/22 (!) 210/82  03/20/22 (!) 167/69    BMET    Component Value Date/Time   NA 124 (L) 03/19/2022 2320   NA 138 12/26/2021 1045   K 5.0 03/19/2022 2320   CL 93 (L) 03/19/2022 2320   CO2 23 03/19/2022 2320   GLUCOSE 114 (H) 03/19/2022 2320   BUN 34 (H) 03/19/2022 2320   BUN 28 (H) 12/26/2021 1045   CREATININE 1.02 (H) 03/19/2022 2320   CREATININE 1.10 (H) 06/16/2021 1212   CREATININE 0.66 06/19/2016 0838   CALCIUM 9.6 03/19/2022 2320   GFRNONAA 58 (L) 03/19/2022 2320   GFRNONAA 53 (L) 06/16/2021 1212    GFRNONAA >89 12/30/2015 1210   GFRAA 61 08/31/2019 1143   GFRAA >89 12/30/2015 1210    Renal function: CrCl cannot be calculated (Patient's most recent lab result is older than the maximum 21 days allowed.).  Clinical ASCVD: No  The 10-year ASCVD risk score (Arnett DK, et al., 2019) is: 48.5%   Values used to calculate the score:     Age: 75 years     Sex: Female     Is Non-Hispanic African American: No     Diabetic: Yes     Tobacco smoker: No     Systolic Blood Pressure: 177 mmHg     Is BP treated: Yes     HDL Cholesterol: 43 mg/dL     Total Cholesterol: 133 mg/dL   A/P: Hypertension longstanding currently above goal on current medications. Medication adherence reported. SBP Goal = < 130 mmHg.  -Continue losartan 100 mg daily.  -Continue spironolactone 12.5 mg daily.  -Increase hydralazine to 25 mg TID.  -Counseled on lifestyle modifications for blood pressure control including reduced dietary sodium, increased exercise, adequate sleep.  Total time in face-to-face counseling 30 minutes. F/U Clinic Visit with PCP in Feb.  Butch Penny, PharmD, P[place]y [place], CPP Clinical Pharmacist [facility] [phone]

## 2022-04-28 ENCOUNTER — Encounter: Payer: Self-pay | Admitting: Internal Medicine

## 2022-06-05 ENCOUNTER — Other Ambulatory Visit: Payer: Self-pay | Admitting: Pharmacist

## 2022-06-05 MED ORDER — FIASP FLEXTOUCH 100 UNIT/ML ~~LOC~~ SOPN: PEN_INJECTOR | SUBCUTANEOUS | 3 refills | Status: AC

## 2022-07-02 ENCOUNTER — Ambulatory Visit: Payer: Self-pay | Admitting: Internal Medicine

## 2022-07-03 ENCOUNTER — Ambulatory Visit: Payer: Medicare Other | Admitting: Internal Medicine

## 2022-09-19 ENCOUNTER — Other Ambulatory Visit: Payer: Self-pay | Admitting: Internal Medicine

## 2022-09-21 NOTE — Telephone Encounter (Signed)
Requested Prescriptions  Pending Prescriptions Disp Refills   losartan (COZAAR) 100 MG tablet [Pharmacy Med Name: LOSARTAN POTASSIUM 100 MG TAB] 90 tablet 0    Sig: TAKE 1 TABLET BY MOUTH EVERY DAY     Cardiovascular:  Angiotensin Receptor Blockers Failed - 09/19/2022  8:31 AM      Failed - Cr in normal range and within 180 days    Creatinine  Date Value Ref Range Status  06/16/2021 1.10 (H) 0.44 - 1.00 mg/dL Final   Creat  Date Value Ref Range Status  06/19/2016 0.66 0.50 - 0.99 mg/dL Final    Comment:      For patients > or = 75 years of age: The upper reference limit for Creatinine is approximately 13% higher for people identified as African-American.      Creatinine, Ser  Date Value Ref Range Status  03/19/2022 1.02 (H) 0.44 - 1.00 mg/dL Final   Creatinine, Urine  Date Value Ref Range Status  06/01/2021 194.16 mg/dL Final    Comment:    Performed at [facility], [address], [place], [place] [address]         Failed - K in normal range and within 180 days    Potassium  Date Value Ref Range Status  03/19/2022 5.0 3.5 - 5.1 mmol/L Final         Failed - Last BP in normal range    BP Readings from Last 1 Encounters:  04/21/22 (!) 177/68         Passed - Patient is not pregnant      Passed - Valid encounter within last 6 months    Recent Outpatient Visits           5 months ago Hypertension associated with diabetes [facility])   [place] [facility] [place], Blanche L, RPH-CPP   5 months ago Type 2 diabetes mellitus with peripheral neuropathy [facility])   [place] [facility] Marcine Matar, MD   6 months ago Essential hypertension   [place] [facility] [place], Redland L, RPH-CPP   7 months ago Type 2 diabetes mellitus with peripheral neuropathy [facility])   [place] [facility] [place], [place] L, RPH-CPP   8 months ago Type 2 diabetes mellitus  with diabetic polyneuropathy, with long-term current use of insulin [facility])    [facility] Marcine Matar, MD

## 2024-03-18 NOTE — Discharge Summary (Signed)
 [facility] Discharge Summary  Admit Date: 03/14/2024 Discharge Date: 03/17/2024  5:10 PM  Admitting Physician: Camellia Rosella Seip, MD Discharge Physician: No att. 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] Thrombocytopenia () [D69.6] Liver mass [R16.0] Thoracic compression fracture, closed, initial encounter (CMS/HHS-HCC) [S22.000A] Symptomatic anemia [D64.9] Compression fracture of body of thoracic vertebra (CMS/HHS-HCC) [S22.000A] Hepatitis B virus infection, unspecified chronicity [B19.10] Other specified diseases of liver [K76.89] Acute on chronic anemia [D64.9] Type 2 diabetes mellitus with stage 3a chronic kidney disease, with long-term current use of insulin  (CMS/HHS-HCC) [Z88.77, N18.31, Z79.4] Stage 3a chronic kidney disease (CKD) (CMS-HCC) [N18.31]  Discharge Diagnoses:  Principal Problem:   Acute on chronic anemia Active Problems:   Type 2 diabetes mellitus with stage 3 chronic kidney disease, with long-term current use of insulin  (CMS/HHS-HCC)   Thrombocytopenia ()   Hemoglobin E disease ()   Stage 3a chronic kidney disease (CKD) (CMS-HCC)   Hypomagnesemia   Hypertension   Fall at home, initial encounter   Abnormal MRI, liver   Transaminitis   Thoracic compression fracture, with delayed healing, subsequent encounter   Thoracic compression fracture, closed, initial encounter (CMS/HHS-HCC)   Hepatitis B virus infection, unspecified chronicity Resolved Problems:   * No resolved hospital problems. *  Primary Diagnosis: Admitted for  PMH of chronic anemia d/t Hgb E disease, chronic TCP, DMII, CKD, HTN, Hx of TB, Hx of thoracolumbar compression fractures, and osteoporosis presenting to [facility] with complaints of fall at home. Patient had an unwitnessed fall in the bathroom PTA.  EMS was called and brought her to the ED.  Recent 9/29  MRI abdomen showed 3.8 cm liver lesion.  On admission CT brain without contrast was negative.  CT thoracic spine showed T9 compression fracture and other compression fractures, as well as the hepatic lesion.  MRI of the liver showed a 3.5 cm liver mass.  MRI thoracic spine confirmed the T9 compression fracture, as well as other fractures and some degenerative changes.    NOT a candidate for kyphoplasty  Pt with elevated AFP and concern for hepatocellular cancer.  Liver biopsy performed with path pending.        Results Pending at Discharge:  Pathology: liver bx Unresulted Labs (From admission, onward)    None      Please see phone numbers at end of this summary for lab contact information.   Follow-up/Care Transition Plan: Sched. appts: Future Appointments  Date Time Provider Department Center  03/22/2024  1:00 PM Wendell Viktoria Gully, MD [facility] HEM ONC [address]  03/24/2024  7:30 PM [facility] MR 1 DRAH MRI [facility] [place]  04/05/2024 11:00 AM Misaghian-Xanthos, Negin, MD [facility] [place]  04/26/2024  2:00 PM [facility] LAB FLOOR 3 DRAHCCL [facility] [place]  04/26/2024  3:00 PM Mauri Geroge Browning, MD D[facility] [address]  05/09/2024 10:00 AM Rozella Jerilynn Fontana, PA [facility]  05/12/2024  9:30 AM Mannie Willene Began, MD [facility] [address]  06/09/2024  1:00 PM DRAH OPI DEXA 1 RALOPIMBD M[facility]  02/09/2025 10:00 AM Rozella Jerilynn Fontana, PA [facility]    Follow-up info: [place] [address] [place] [address] [phone]  Follow up   Mauri Geroge Browning, MD [address] [place] [address] [phone]  Follow up       Allergies/Intolerances:  Allergies  Allergen Reactions  . Shellfish  Containing Products Other (See Comments) and Shortness Of Breath    Sneezing, also  . Beef Containing Products Other (See Comments)    Generalized burning sensation  . Beef Derived (Bovine) Other (See Comments)     Generalized burning sensation  . Nutritional Supplements Other (See Comments)    Oranges cause lower extremity burning  . Amlodipine  Other (See Comments) and Swelling    Edema of the lower extremities  . Metformin  Diarrhea  . Shellfish Containing Products Itching     New Adverse Drug Events: none  Medications:     Current Discharge Medication List     PAUSE taking these medications      Instructions  atorvastatin 20 MG tablet Wait to take this until your doctor or other care provider tells you to start again. HOLD until see Medical oncology and review liver biopsy Quantity: 90 tablet Refills: 3  Commonly known as: LIPITOR Take 1 tablet (20 mg total) by mouth at bedtime For: high cholesterol       START taking these medications      Instructions  oxyCODONE  5 MG immediate release tablet Quantity: 30 tablet Refills: 0  Commonly known as: ROXICODONE  Take 0.5 tablets (2.5 mg total) by mouth every 4 (four) hours as needed for Pain       CONTINUE taking these medications      Instructions  alendronate  70 MG tablet Quantity: 4 tablet Refills: 11  Commonly known as: FOSAMAX  Take 1 tablet (70 mg total) by mouth every 7 (seven) days Take with a full glass of water. Do not lie down for the next 30 min.   amLODIPine  2.5 MG tablet Quantity: 90 tablet Refills: 3  Commonly known as: NORVASC  Take 1 tablet (2.5 mg total) by mouth once daily Last time this was given: 2.5 mg on March 17, 2024  9:58 AM   blood glucose diagnostic test strip Quantity: 100 each Refills: 12  1 each (1 strip total) 4 (four) times daily Use as instructed.   glucose blood test strip Quantity: 400 each Refills: 3 Generic drug: blood glucose diagnostic  Check FS 4 x per day Doctor's comments: match meter with test strips and covered by insurance   blood glucose meter kit Quantity: 1 each Refills: 1  Use as directed daily. Doctor's comments: Please dispense brand per patient and  insurance preference. (DX: E11.9)   cholecalciferol  1000 unit capsule Refills: 0  Commonly known as: VITAMIN D3 Take 1 capsule by mouth once daily Last time this was given: Ask your nurse or doctor   folic acid  1 MG tablet Quantity: 90 tablet Refills: 3  Commonly known as: FOLVITE  Take 1 tablet (1 mg total) by mouth once daily Last time this was given: 1 mg on March 17, 2024  9:57 AM   hydrALAZINE  25 MG tablet Quantity: 200 tablet Refills: 3  Commonly known as: APRESOLINE  Take 1 tablet (25 mg total) by mouth 2 (two) times daily Last time this was given: 25 mg on March 17, 2024  9:57 AM   KIRSTY PEN 100 unit/mL (3 mL) Inpn Quantity: 15 mL Refills: 3 Generic drug: insulin  aspart-[id number]  INJECT 5 UNITS SUBCUTANEOUSLY 3 (THREE) TIMES DAILY WITH MEALS   lancing device with lancets kit Quantity: 100 each Refills: 3  Use 1 each 3 (three) times daily Doctor's comments: Please dispense brand per patient and insurance preference. (DX: 250.00)   LANTUS  SOLOSTAR U-100 INSULIN  pen injector (concentration 100 units/mL) Quantity: 9 mL Refills: 12 Generic drug: insulin   GLARGINE  Inject 8 Units subcutaneously at bedtime Last time this was given: Ask your nurse or doctor   lidocaine  4 % patch Quantity: 30 patch Refills: 11  Commonly known as: SALONPAS Place 1 patch onto the skin daily Apply patch to the most painful area for up to 12 hours in a 24 hours period. Last time this was given: 1 patch on March 17, 2024  9:58 AM   pen needle, diabetic 32 gauge x 3/16 Ndle Quantity: 400 each Refills: 3  USE AS DIRECTED 4 TIMES A DAY   spironolactone  25 MG tablet Quantity: 100 tablet Refills: 3  Commonly known as: ALDACTONE  Take 0.5 tablets (12.5 mg total) by mouth once daily Last time this was given: 12.5 mg on March 17, 2024  9:57 AM   TYLENOL  EXTRA STRENGTH 500 MG tablet Refills: 0 Generic drug: acetaminophen   Take 500 mg by mouth every 6 (six) hours as needed for Fever  or Pain. Indications: fever, pain For: fever, pain Last time this was given: 650 mg on March 17, 2024 11:39 AM       STOP taking these medications    losartan  100 MG tablet Commonly known as: COZAAR          Brief History of Present Illness:  Chief Complaint: Fall, weakness, back pain   HPI: Olivia Williamson is a 78 y.o. Vietnamese female with chronic anemia (with hemoglobin E disease), chronic thrombocytopenia, hepatitis B infection, T2DM, HTN, CKD3a, multiple thoracolumbar compression fractures, osteoporosis, and Olivia/o tuberculosis (treated 2018), who presents after a fall at home.    History provided mainly from daughter Levorn at bedside.  Encounter with patient and daughter performed with Vietnamese translator Ardean [phone]).  Patient lives with Levorn in [place]. She ambulates with a walker.  This morning the patient had an unwitnessed fall in the bathroom. She was found seated on the floor holding her walker.  Patient was unable to get up due to apparent weakness in both arms and legs.  She did not seem to complain of increased pain compared to baseline. She has chronic back pain radiating to her legs attributed to prior vertebral compression fracture (prior images 03/2023 showed compression fractures in T9, T12, L1).   EMS called due to patient being unable to get up.  Transported to [facility] ED.    In ED, initial BP 157/61, HR 71, SpO2 94% on RA, afebrile.  Labs included Hgb 6.0 (compared to 7.0 on 02/08/2024), plt 56k, WBC 4.6, BUN/Cr 35/1.3, Mg 1.3, BG 103, AST 60, ALT 74, T bili 0.7, alk phos 76.  UA negative for WBCs. SARS-CoV-2, influenza, and RSV negative.  CT brain w/o contrast negative for acute process. CT thoracic and lumbar spine w/o contrast shows progressive height loss of the T9 vertebral fracture with rsistent fracture lucency across the anterior and superior vertebral body concerning for acute on chronic compression fracture. No osseous retropulsion.  New  age-indetermiate mild compression fracture of the superior T3 vertebral body compared to 03/2023.   Unchanged chronic severe compression fractures of the T12 and L1 vertebral bodies.   Given mag sulfate 2 g IV and 1 unit PRBC ordered by ED provider. Referred for admission to [facility].      On my encounter, patient laying in ED stretcher.  Complains of sharp mid low back pain when asked, but this is similar to her chronic pain.  _____________________   Hospital Course by Problem:  #Abnormal MRI liver #History of Hepatitis B infection #Transaminitis  Treated for Hep B in 2/25, Hep B DNA PCR 3.7 millio nIU/L in 8/25. Scheduled to see [facility] in 12/25.  Outpatient MRI abdomen w/o contrast 9/29 showed a new heterogeneous signal abnormality in hepatic segment 5/8 is suspicious for malignancy but incompletely evaluated in the absence of intravenous contrast.  Outpatient MRI liver w/wo contrast scheduled for 10/31, performed 10/21. MRI Liver indicative of mass in hepatic segment 8 measuring up to 3.5 cm most suspicious for malignancy. If LI-RADS were to be applied in the setting of hepatitis B, this would be most compatible with a LR M lesion, suspicious for malignancy but not specific for hepatocellular carcinoma, differential including hepatocellular carcinoma, cholangiocarcinoma, or a combined/biphenotypic tumor, less likely metastatic disease in the absence of a known primary malignancy. Oncology consult placed for MRI findings c/f new malignancy, appreciate recs, advised to proceed with liver bx due to concern for malignancy. Patient has never had a colonoscopy or EGD, referral for colonoscopy placed last month upon chart review. CEA 1.6, Ca 19-9 25. Last AST 42, ALT 56.  - Patient's daughter provided consent for liver bx, plan to perform bx this afternoon; stat CBC and INR ordered this afternoon prior to bx  - F/u with [facility] 05/12/24 ; encourage colonoscopy screening - Monitor LFTs, INR - Last  MM labs done in 9/25, repeat labs pending  - CT chest w/ and NM bone scan ordered per oncology request; onc to f/u on imaging to determine bx location   #Insufficiency fracture, acute on chronic, thoracic spine, lumbar spine #Osteoporosis #Chronic LBP #Recurrent falls  Patient presented to [facility] s/p unwitnessed ground level fall. Patient has Olivia/o falls c/b compression fractures. CT and MRI of thoracic and lumbar spine indicative of acute on chronic T9 compression fracture with progressive height loss and degenerative changes of the cervical and thoracic spine resulting in severe canal stenosis at T10-T11. Partially visualized severe canal stenosis at C5-C6 and C6-C7.  Age-indeterminate mild compression fx of superior T3. Unchanged chronic severe T12 and L1 compression fractures. Patient continues to endorse point tenderness to thoracic spine with associated kyphosis. Patient has chronic low back pain but denies any worsening of pain today. Vit D level 25. Neurosurgery consulted, appreciate recs, patient is not a candidate for kyphoplasty. Patient has not had a BM today. B12 309, TSH 3.78. NM bone scan performed this morning.  - Education on fall precautions  - PT/OT eval  - Non-surgical management - Senokot and miralax  prn - Continue pain control with APAP 975 mg tid with goal <3000 g/day; Lidocaine  patch daily; oxycodone  2.5 mg q4h prn for moderate/severe pain - Continue alendronate , vitamin D  - NM bone scan read pending     #CKD 3a #AKI Cr 1.5 with morning labs, increased from 1.1 yesterday. Mg 2.4. AKI likely prerenal in the setting of poor po intake. - Continue D5W - Hold losartan , continue spironolactone   - Strict I&Os, volume status  - Renally dose meds, avoid nephrotoxic meds      #Acute on chronic anemia, improved  #Thrombocytopenia, Hemoglobin E disease Chronic anemia likely 2/2 Hgb E dz. Thrombocytopenia idiopathic, likely related to hemoglobinopathy and/or Hep B. Upon chart  review, baseline Hgb ~6.5-9 and baseline plt 60-100k. Patient received 1U pRBCs in ED after Hgb 6.0 upon admission, states she feels well but overall fatigued. Hgb improved to 7.4 this morning, which is within her baseline range. Plt 56 this morning. Retic % 3.53, Haptoglobin 24 suggestive of hemolysis 2/2 chronic anemia.  - F/u  with outpt hematologist 12/3 as scheduled - Trend CBCs     #Insulin  dependent type 2 diabetes #Hypoglycemia Last HgbA1c 6.0%. Takes glargine 8 units qhs and aspart 5U w/ meals. On D5W due to POC BG of 56 this morning, most recent POC BG 84. Patient NPO in setting of liver bx this afternoon.  - Continue D5W - Lispro correction dose TIDCC while admitted  - Continue glargine 8U qhs      #HTN Last BP 150/52.   - Continue losartan  100 mg daily, spironolactone  12.5 mg daily, amlodipine  2.5 mg daily, hydralazine  25 mg BID with BP hold parameters.   - Watch renal function    #HLD Continue Atorvastatin 20 mg daily in setting of elevated LFTs. - CTM for malnutrition    Social Drivers of Health with Concerns   Financial Resource Strain: High Risk (03/14/2024)   Overall Financial Resource Strain (CARDIA)   . Difficulty of Paying Living Expenses: Very hard  Food Insecurity: Food Insecurity Present (03/14/2024)   Hunger Vital Sign   . Worried About Programme Researcher, Broadcasting/film/video in the Last Year: Often true   . Ran Out of Food in the Last Year: Often true  Transportation Needs: Unmet Transportation Needs (03/14/2024)   PRAPARE - Transportation   . Lack of Transportation (Medical): Yes   . Lack of Transportation (Non-Medical): No  Housing Stability: High Risk (03/14/2024)   Housing Stability Vital Sign   . Unable to Pay for Housing in the Last Year: Yes   . Number of Times Moved in the Last Year: 0   . Homeless in the Last Year: No    Surgeries and Procedures Performed:  NOT A candidate for kyphoplasty  _____________________  Discharge Exam:  BP (!) 165/61 (BP  Location: Left upper arm, Patient Position: Lying)   Pulse 69   Temp 36.9 C (98.4 F) (Oral)   Resp 15   Ht 149.9 cm (4' 11)   Wt (!) 45 kg (99 lb 1.6 oz)   LMP  (LMP Unknown)   SpO2 98%   BMI 20.02 kg/m    BP (!) 165/61 (BP Location: Left upper arm, Patient Position: Lying)   Pulse 69   Temp 36.9 C (98.4 F) (Oral)   Resp 15   Ht 149.9 cm (4' 11)   Wt (!) 45 kg (99 lb 1.6 oz)   LMP  (LMP Unknown)   SpO2 98%   BMI 20.02 kg/m  General appearance: alert, appears stated age, cooperative, and fatigued Throat: lips, mucosa, and tongue normal; teeth and gums normal Lungs: clear to auscultation bilaterally  Pertinent Lab Testing: Recent Labs  Lab 03/15/24 0635 03/16/24 0620 03/17/24 0354  NA 138 137 133*  K 4.1 4.3 4.5  CL 110* 107 103  CO2 24 24 23   BUN 27* 35* 33*  CREATININE 1.1* 1.5* 1.5*  GLUCOSE 110 65* 101  CALCIUM  8.8 9.0 8.7   Recent Labs  Lab 03/14/24 1002 03/15/24 0635 03/17/24 0354  AST 60* 42* 35  ALT 74* 56* 45*  ALKPHOS 76 61 52  TBILI 0.7 0.5 0.6    Recent Labs  Lab 03/15/24 0635 03/16/24 0621 03/17/24 0354  WBC 3.4 4.2 3.9  HGB 7.3* 7.4* 7.1*  HCT 21.9* 22.2* 21.6*  PLT 54* 56* 56*   Recent Labs  Lab 03/14/24 1002 03/15/24 0635 03/16/24 1312  APTT 29.8  --   --   INR 1.0 1.0 1.0     Other Pertinent Labs:    Micro:  Lab Results  Component Value Date   BLDCULT No growth detected. 12/19/2022   BLDCULT No growth detected. 12/19/2022    No results found for this visit on 03/14/24. Lab Results  Component Value Date   SARSCOV2 Not Detected 03/14/2024   SARSCOV2 Not Detected 07/23/2023   SARSCOV2 Not Detected 07/20/2021   Lab Results  Component Value Date   FLUARNA Not Detected 03/14/2024   FLUBRNA Not Detected 03/14/2024   RSVRNA Not Detected 03/14/2024      Pertinent Imaging:   X-ray entire spine 2 or 3 views Result Date: 03/17/2024 Radiographs of the entire thoracic and lumbar spine - 2 views, 6 images.  Indication: multiple compression fractures, eval alignment, S22.000A Wedge compression fracture of unspecified thoracic vertebra, initial encounter for closed fracture (CMS/HHS-HCC), S22.070D Wedge compression fracture of t9-t10 vertebra, subsequent encounter for fracture with routine healing. Comparison: MRI 03/14/2024, CT 03/15/2024 Findings: Frontal and lateral images of the thoracolumbar spine were acquired; upper and lower images were appropriately fused. Cardiomegaly. No focal airspace opacity pneumothorax or pleural effusion.] Technique clips at the right upper quadrant abdomen. Colonic stool visualized but no dilated bowel loops. Partially visualized right proximal femoral ORIF. Similar T9, T12 and L1 compression deformities with focal kyphosis at the T12-L1 level on the lateral view.. Diffusely heterogeneous and osteopenic appearance of the imaged skeleton. Height loss described at T3 level is not well characterized on this exam. Impression: Similar thoracolumbar compression deformities since prior MRI accounting for differences in modalities. Please note that this examination is designed to assess spinal alignment; if detailed evaluation of the cervical, thoracic, and/or lumbar spine is desired, consider dedicated cervical spine, thoracic spine, and/or lumbar spine radiographic studies. Electronically Signed by:  Beverley Norfolk, MD, [facility] Electronically Signed on:  03/17/2024 9:36 AM  NM bone scan whole body Result Date: 03/16/2024 Procedure: NM BONE SCAN WHOLE BODY Indication: Female, 78 years old. Compression fracture, thoracic, known malignancy, Compression fracture, lumbar, known malignancy, R16.0 Hepatomegaly, not elsewhere classified. Radiotracer: 24.9 mCi of Tc-45m MDP, IV. Technique: Delayed whole-body planar images were obtained in anterior and posterior projections. Prior bone scans: None Correlative imaging: CTs 03/18/2024 Findings: Multiple foci of radiotracer uptake, including at  known T9, T12, and L1 vertebral body compression fractures. There is a focus at the T8 right posterior rib, favored secondary to prior rib fracture that may be pathologic in etiology. There are multiple subtle foci in the sternum, favored to reflect shine-through from vertebrae. There is radiotracer signal at the right aspect of the lower lumbar spine, favored secondary to degenerative osteophytosis. Signal localized to right hip favored secondary to prior fixation. Physiologic tracer activity is identified in the soft tissues, kidneys and urinary bladder. Impression: 1.  No definite scintigraphic evidence of osseous metastatic disease. 2.  Multiple foci of radiotracer uptake corresponding to prior vertebral body compression fractures. These may reflect benign post-fracture osseous remodeling but cannot exclude underlying metastatic process predisposing to fracture. Electronically Reviewed by:  Thersia Lyell, MD, [facility] Electronically Reviewed on:  03/16/2024 2:12 PM I have reviewed the images and concur with the above findings. Electronically Signed by:  Ezella Agent, MD, [facility] Electronically Signed on:  03/16/2024 5:11 PM  CT liver biopsy Result Date: 03/16/2024 EXAM: CT LIVER BIOPSY INDICATION: liver lesion, R16.0 Hepatomegaly, not elsewhere classified ATTENDING: Vernell Can, MD SEDATION/ANESTHESIA: Level of sedation/anesthesia: Moderate sedation - the physician who performed the diagnostic/therapeutic procedure provided continuous face to face moderate sedation services for this procedure with the assistance of an independent  trained observer who had no other duties during the procedure. Moderate sedation time (if applicable): 27 minutes Agent(s): Fentanyl  50 mcg, 1 mg versed  ANTIBIOTIC PROPHYLAXIS: No prophylactic antibiotic indicated. OTHER MEDICATIONS: None ACCESS: Percutaneous via right abdomen COMPLICATIONS: None immediate PREPROCEDURE: Informed written consent was obtained after a  discussion of the risks, benefits, and alternatives to the procedure. A Time-Out was performed immediately prior to the procedure. TECHNIQUE AND FINDINGS: The patient was placed supine on the procedure table. Preliminary CT and US  demonstrated central hepatic mass. Images were saved and sent to PACS. The overlying skin was prepped and draped sterilely and anesthetized with 1% lidocaine  mixed with bicarbonate. Under intermittent CT and US  guidance, the introducer needle of an 18 gauge core needle biopsy system was advanced to the liver mass. When appropriate positioning was confirmed with US , 4 specimens were obtained. A representative from pathology was present to collect and review the specimen. There was no backbleeding from the needle. Arista was used for tract embolization and hemostasis. The needle and biopsy device were removed. The access site was cleaned and dressed. The patient tolerated the procedure well. IMPRESSION: Successful CT and US -guided liver mass biopsy. 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 Iodinated contrast was administered to improve disease detection and to further define anatomy. Coronal and sagittal reformatted images were generated and reviewed. 3-D maximal intensity projection (MIP) reconstructions of the chest were performed to potentially increase study sensitivity. COMPARISON: 03/27/2023. CT 03/14/2024 thoracic spine. 02/21/2024 abdomen MR. 03/14/2024 liver MR . FINDINGS CHEST: Mediastinum/Axilla: No lymphadenopathy. Multiple thyroid  nodules. Vessels: Enlarged main pulmonary artery 3.9 cm formerly 3.8 cm. Heart: Cardiomegaly.  Trace pericardial effusion slightly increased. Lungs: No focal consolidation. Minimal scar/subsegmental atelectasis at the bases. Less than 5 mm right upper lobe lung nodule stable. Pleura: No effusion. Stable 60% height loss of the T9 vertebral body compression fracture . Persistent fracture lucency across the anterior and superior vertebral body concerning for acute on chronic compression fracture. No osseous retropulsion. Stable chronic severe compression fractures of the T12 and L1 vertebral bodies with exaggerated focal kyphosis of the thoracolumbar junction. Multiple hepatic lesions. Inferior most image demonstrates 1.1 cm pancreatic head cystic lesion adjacent common bile duct. IMPRESSION: Similar enlargement pulmonary artery 3.9 cm, formerly 3.8 cm concerning for pulmonary arterial hypertension. Cardiomegaly with trace pericardial effusion slightly increased. Minimal scar/subsegmental atelectasis at the bases stable. Less than 5 mm right upper lobe lung nodule stable. No focal consolidation. Stable T9, T12 and L1 vertebral body compression fractures as per above. Multiple hepatic lesions. Please see accompanying liver MR 03/14/2024. Inferior most image demonstrates 1.1 cm pancreatic cystic lesion adjacent to the common bile duct. Please correlate with recent abdomen MR . Electronically Signed by:  Oliva DELENA Rack, MD, [facility] Electronically Signed on:  03/15/2024 7:25 PM  MRI liver with and without contrast Result Date: 03/15/2024 Procedure:  MRI Abdomen with and without contrast Indication: Liver lesion, > 1cm, K76.89 Other specified diseases of liver. 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 IV contrast was administered to improve disease detection and further  define anatomy. Findings: Several sequences  are degraded by motion. - Lower Thorax: No suspicious pulmonary abnormalities. No pleural or pericardial effusions. Heart size is enlarged. - Liver: Hepatic siderosis. No significant fat deposition. Simple cysts in hepatic segments 3 and 4A. In segment 8 there is a 3.5 x 3.2 cm mass, series 15 image 9, which is T2 intermediate, restricts diffusion.  It does not definitely have arterial non rim enhancement, and has progressive internal enhancement with a delayed capsule.  The portal and hepatic veins are patent. - Biliary and Gallbladder: No intrahepatic or extrahepatic bile duct dilatation. The gallbladder is not visualized. - Spleen: Enlarged measuring up to 13 cm craniocaudal with siderosis. - Pancreas: Normal in appearance. - Adrenal Glands: Normal in appearance. - Kidneys: Symmetric in size and enhancement. No suspicious renal lesions. No hydronephrosis. - Abdominal Vasculature: No abdominal aortic aneurysm. - Gastrointestinal Tract: No abnormal dilation or wall thickening in the field of view. - Peritoneum/Mesentery/Retroperitoneum: No free fluid in the field of view. - Lymph Nodes: No retroperitoneal or mesenteric lymphadenopathy.  - Body Wall: Unremarkable. - Musculoskeletal:  No aggressive appearing osseous lesions. Impression: 1.  Mass in hepatic segment 8 measuring up to 3.5 cm is most suspicious for malignancy. If LI-RADS were to be applied in the setting of hepatitis B, this would be most compatible with a LR M lesion, suspicious for malignancy but not specific for hepatocellular carcinoma, differential including hepatocellular carcinoma, cholangiocarcinoma, or a combined/biphenotypic tumor, less likely metastatic disease in the absence of a known primary malignancy.   2.  Hepatic and splenic siderosis. Electronically Reviewed by:  Jerrell Senior, MD, [facility] Electronically Reviewed on:  03/15/2024 10:15 AM I have reviewed the images and concur with the above findings. Electronically Signed by:   Olam Rouse, MD, [facility] Electronically Signed on:  03/15/2024 12:49 PM  MRI thoracic spine without contrast Result Date: 03/15/2024 MRI THORACIC SPINE WITHOUT CONTRAST INDICATION: Compression fracture, thoracic, Evaluate T9 compression fracture, may  need kyphoplasty, S22.000A Wedge compression fracture of unspecified thoracic vertebra, initial encounter for closed fracture (CMS/HHS-HCC) COMPARISON: CT thoracic spine 03/14/2024, MR thoracic spine 04/13/2023 TECHNIQUE/PROTOCOL: Trauma protocol thoracic spine MRI performed without contrast administration. FINDINGS: Anatomical Variants: Conventional spinal numbering. Surgical Findings: None. Alignment: Focal kyphosis centered at T12/L1 secondary to chronic compression fractures. Bone Marrow Signal: Heterogenous marrow signal. No suspicious lesions. There is edema at the T9 superior endplate and within the central aspect of the L1 vertebral body. Severe height loss of the T12-L1 vertebral bodies with mild osseous retropulsion, unchanged from 04/13/2023. Approximately 60% height loss of the T9 vertebral body with superior plate edema, progressed from 04/12/2023. Mild superior endplate concavity and vertebral body height loss less than 20% at T3 without edema. Spinal Cord: Normal in caliber and signal. Conus Medullaris: Terminates at approximately L1. Degenerative Changes: Severe canal stenosis at C5-C6 and C6-C7 secondary to spondylosis. Moderate foraminal stenosis at left L1-L2. Moderate bilateral foraminal stenosis at T9-T10. Severe canal stenosis at T9-T10 and moderate canal stenosis at T10-T11 secondary to ligament flavum hypertrophy, facet arthropathy, and small disc bulge. Regional Soft Tissues: 0.9 cm right thyroid  nodule. Heterogeneous hepatic mass, better evaluated on the contemporaneous MRI abdomen. IMPRESSION: 1.  Acute on chronic T9 compression fracture with progressive height loss when compared to 04/13/2023 (now approximately 60% vertebral body  height loss). No significant retropulsion/canal stenosis. 2.  No substantial change in marked compression fractures of T12 and L1 with unchanged marrow edema in the L1 vertebral body. 3.  Compared to  April 13, 2023 there is mild height loss at T3 without marrow edema. 4.  Degenerative changes of the cervical and thoracic spine resulting in severe canal stenosis at T10-T11. Partially visualized severe canal stenosis at C5-C6 and C6-C7. This can be further evaluated with MRI cervical spine as clinically indicated. 5.  Please see separate report from same day abdominal MRI for evaluation of the hepatic mass. The wet read (critical or emergent communication) was reviewed prior to this dictation and there are no critical differences between wet read and the impressions in this final report. Electronically Reviewed by:  Earleen Blanch, MD, [facility] Electronically Reviewed on:  03/15/2024 9:25 AM I have reviewed the images and concur with the above findings. Electronically Signed by:  Carliss Salt, MD, [facility] Electronically Signed on:  03/15/2024 10:41 AM  CT brain without contrast Result Date: 03/14/2024 CT BRAIN WITHOUT CONTRAST INDICATION: Head trauma, minor (Age >= 65y), fall in bath tub, generalized weakness COMPARISON: CT head from 04/13/2023 TECHNIQUE: Standard noncontrast brain CT. FINDINGS: Brain Parenchyma: There is no hemorrhage, cerebral edema, acute cortical infarction, mass, mass effect, or midline shift. 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 Ventricles and Sulci: Normal for age.  Extra-Axial Spaces: No extra-axial fluid collection. Basal Cisterns: Normal. Paranasal Sinuses: Mild mucosal thickening and aerated secretions within the left maxillary sinus.  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 Orbits: Bilateral lens replacements.  Cranium and Bones: Normal. Soft Tissues: Normal. IMPRESSION: No acute intracranial traumatic injury. Electronically Reviewed by:  Rachele Read, MD, [facility] Electronically Reviewed on:  03/14/2024 1:14 PM I have reviewed the images and concur with the above findings. Electronically Signed by:  Ozell Lansing, MD, [facility] Electronically Signed on:  03/14/2024 1:28 PM  CT thoracic spine without contrast Result Date: 03/14/2024 CT THORACIC SPINE WITHOUT CONTRAST CT LUMBAR SPINE WITHOUT CONTRAST INDICATION: Back trauma, no prior imaging (Age >= 16y), fall in bathtub, pain from mid back down COMPARISON: CT thoracic and lumbar spine from 04/13/2023 TECHNIQUE/PROTOCOL: Standard protocol noncontrast thoracic and lumbar spine axial CT images were obtained with generation of coronal and sagittal reformats. FINDINGS: Anatomical Variants: Conventional spinal numbering Alignment: Exaggerated kyphosis at the thoracolumbar junction, unchanged from prior. Trauma: *  Moderate T9 vertebral body compression fracture with progressive 60% height loss and persistent fracture lucency involving the anterior superior endplate. No osseous retropulsion. *  Mild compression fracture of the superior T3 endplate with less than 20% height loss that is new from prior CT on 04/13/2023. *  Chronic severe compression fractures of the T12 and L1 vertebral bodies with essentially vertebral plana focal exaggerated kyphosis at the thoracolumbar junction that appears unchanged from prior. Associated osseous fusion of T1-L1 from bringing osteophytes. *  Diffuse osteopenia. Thoracic Spine: Facet arthropathy with medially directed anterior osteophytes/calcification of the thickened ligamentum flavum resulting in mild osseous spinal canal stenosis at T10-T11, unchanged from prior. Lumbar Spine: Central disc extrusion at L3-L4 with cranial migration of disc material is unchanged from prior. Associated likely at least mild spinal  canal stenosis in the setting of degenerative disc changes and ligamentum flavum thickening with facet arthropathy. Unchanged severe spinal canal stenosis at L4-L5 from disc bulge, mild facet arthropathy, and substantial ligamentum flavum thickening. Sacroiliac Joints: Moderate bilateral sacroiliac degenerative changes. Regional Soft Tissues: Patulous fluid-filled esophagus that can put patient at risk for aspiration. Trace pericardial fluid. Central infiltrative hypodensity involving  the right hepatic lobe. Status post cholecystectomy. Mild calcified atherosclerotic plaque. Nonobstructive bilateral renal calculi measuring up to 4 mm bilaterally. 3 mm right apical pulmonary nodule, stable from 03/27/2023. IMPRESSION: 1.  Progressive, now 60%, height loss of the T9 vertebral body compression fracture. Persistent fracture lucency across the anterior and superior vertebral body concerning for acute on chronic compression fracture. No osseous retropulsion. 2.  Age-indeterminate mild compression fracture of the superior T3 vertebral body new from CT on 04/13/2023. 3.  Unchanged chronic severe compression fractures of the T12 and L1 vertebral bodies with exaggerated focal kyphosis of the thoracolumbar junction. 4.  Infiltrative right hepatic lobe lesion concerning for malignancy. Attention on scheduled MRI liver with and without contrast. Findings were discussed by telephone with [facility] ESTANISLADO BLONDER, PA by Rachele Read, MD at 03/14/2024 12:04 PM. Electronically Reviewed by:  Rachele Read, MD, [facility] Electronically Reviewed on:  03/14/2024 1:19 PM I have reviewed the images and concur with the above findings. Electronically Signed by:  Ozell Lansing, MD, [facility] Electronically Signed on:  03/14/2024 1:27 PM  CT lumbar spine without contrast Result Date: 03/14/2024 CT THORACIC SPINE WITHOUT CONTRAST CT LUMBAR SPINE WITHOUT CONTRAST INDICATION: Back trauma, no prior imaging (Age >=  16y), fall in bathtub, pain from mid back down COMPARISON: CT thoracic and lumbar spine from 04/13/2023 TECHNIQUE/PROTOCOL: Standard protocol noncontrast thoracic and lumbar spine axial CT images were obtained with generation of coronal and sagittal reformats. FINDINGS: Anatomical Variants: Conventional spinal numbering Alignment: Exaggerated kyphosis at the thoracolumbar junction, unchanged from prior. Trauma: *  Moderate T9 vertebral body compression fracture with progressive 60% height loss and persistent fracture lucency involving the anterior superior endplate. No osseous retropulsion. *  Mild compression fracture of the superior T3 endplate with less than 20% height loss that is new from prior CT on 04/13/2023. *  Chronic severe compression fractures of the T12 and L1 vertebral bodies with essentially vertebral plana focal exaggerated kyphosis at the thoracolumbar junction that appears unchanged from prior. Associated osseous fusion of T1-L1 from bringing osteophytes. *  Diffuse osteopenia. Thoracic Spine: Facet arthropathy with medially directed anterior osteophytes/calcification of the thickened ligamentum flavum resulting in mild osseous spinal canal stenosis at T10-T11, unchanged from prior. Lumbar Spine: Central disc extrusion at L3-L4 with cranial migration of disc material is unchanged from prior. Associated likely at least mild spinal canal stenosis in the setting of degenerative disc changes and ligamentum flavum thickening with facet arthropathy. Unchanged severe spinal canal stenosis at L4-L5 from disc bulge, mild facet arthropathy, and substantial ligamentum flavum thickening. Sacroiliac Joints: Moderate bilateral sacroiliac degenerative changes. Regional Soft Tissues: Patulous fluid-filled esophagus that can put patient at risk for aspiration. Trace pericardial fluid. Central infiltrative hypodensity involving the right hepatic lobe. Status post cholecystectomy. Mild calcified atherosclerotic  plaque. Nonobstructive bilateral renal calculi measuring up to 4 mm bilaterally. 3 mm right apical pulmonary nodule, stable from 03/27/2023. IMPRESSION: 1.  Progressive, now 60%, height loss of the T9 vertebral body compression fracture. Persistent fracture lucency across the anterior and superior vertebral body concerning for acute on chronic compression fracture. No osseous retropulsion. 2.  Age-indeterminate mild compression fracture of the superior T3 vertebral body new from CT on 04/13/2023. 3.  Unchanged chronic severe compression fractures of the T12 and L1 vertebral bodies with exaggerated focal kyphosis of the thoracolumbar junction. 4.  Infiltrative right hepatic lobe lesion concerning for malignancy. Attention on scheduled MRI liver with and without contrast. Findings were discussed by telephone with EMILY TINSLEY DEMENT,  PA by Rachele Read, MD at 03/14/2024 12:04 PM. Electronically Reviewed by:  Rachele Read, MD, [facility] Electronically Reviewed on:  03/14/2024 1:19 PM I have reviewed the images and concur with the above findings. Electronically Signed by:  Ozell Lansing, MD, [facility] Electronically Signed on:  03/14/2024 1:27 PM  X-ray chest single view portable Result Date: 03/14/2024 X-RAY CHEST, 1 view INDICATION: Lung Aeration COMPARISON: 07/23/2023 FINDINGS/IMPRESSION: Heart/Mediastinum: Stable enlarged cardiomediastinal contours. Lungs: No focal consolidation. Pleura: No large effusion. No pneumothorax. Bones: Multiple thoracolumbar compression deformities. See forthcoming CTs of the thoracic and lumbar spine for further evaluation. Electronically Signed by:  Shyrl Admire, MD, [facility] Electronically Signed on:  03/14/2024 11:31 AM  MRI abdomen without contrast Liver Iron  Quantification Addendum Date: 02/22/2024 ADDENDUM: Addendum:  Quantitative analysis of the multi-echo sequence yielded the following - Mean liver R2*:  125 /sec at 1.5 T  Corresponding estimated mean liver iron  concentration (method of Hankins et al):           3.1 mg Fe / g dry weight of liver (Normal <1.8 mg / g) Electronically Signed by:  Toya Irving, MD, [facility] Electronically Signed on:  02/22/2024 3:57 PM  Result Date: 02/22/2024 Procedure:  MRI Abdomen without contrast Indication: Liver disease, chronic, tumor screening, E83.19 Other disorders of iron  metabolism, R76.8 Other specified abnormal immunological findings in serum Comparison:  CT abdomen pelvis dated 12/18/2022 MRI thoracic spine dated 04/13/2023 Technique: Noncontrast MR imaging of the abdomen was performed using the Liver Protocol. Findings: Evaluation is markedly limited by motion artifact. - Lower Thorax: No suspicious pulmonary abnormalities. No pleural or pericardial effusions. - Liver: Normal in morphology. Signal dropout on in phase imaging is compatible with iron  deposition. Position.  Hepatic cysts. New vague area of mildly T2 hyperintense signal within hepatic segment 5/8 measuring approximately 3.8 x 3.1 cm (series 5 image 10). - Biliary and Gallbladder: No intrahepatic or extrahepatic bile duct dilatation. - Spleen: Signal dropout on in phase imaging is compatible with iron  deposition. - Pancreas: Normal in appearance. - Adrenal Glands: Normal in appearance. - Kidneys: Symmetric in size. No suspicious renal lesions. No hydronephrosis. - Abdominal Vasculature: No abdominal aortic aneurysm. - Gastrointestinal Tract: No abnormal dilation or wall thickening in the field of view. - Peritoneum/Mesentery/Retroperitoneum: No free fluid in the field of view. - Lymph Nodes: No retroperitoneal or mesenteric lymphadenopathy.  - Body Wall: Unremarkable. - Musculoskeletal: Diffuse heterogeneous appearance of the marrow. Partially visualized compression deformities of the T12 and L1 vertebral bodies. No aggressive appearing osseous lesions. Partially visualized right intertrochanteric ORIF. Impression: 1.   New heterogeneous signal abnormality in hepatic segment 5/8 is suspicious for malignancy but incompletely evaluated in the absence of intravenous contrast. Recommend repeat MRI abdomen with and without intravenous contrast. 2.  Hepatosplenic iron  deposition. Electronically Reviewed by:  Mabel Satterfield, MD, [facility] Electronically Reviewed on:  02/22/2024 3:09 PM I have reviewed the images and concur with the above findings. Electronically Signed by:  Edsel Isaacs, MD, [facility] Electronically Signed on:  02/22/2024 3:37 PM   _____________________  Code Status: Prior Goals of care were addressed during this admission: FULL.   Status on Discharge:  Current activity: Walks occasionally (03/17/24 1000) Current mobility: Slightly limited (03/17/24 1000)  Activity Recommendation: activity as tolerated  Other Discharge Instructions: Services setup at discharge: None Tubes/lines at discharge: None  Diet: No diet orders on file  Wound Care Order Instructions     None       _____________________  Time spent on discharge process: 45 minutes    VICTORIA JOAN DORR, MD [facility]  03/18/2024   Hospital Contact Information:  [facility]) [facility]) [facility]) [facility])  Pending tests:  Laboratory: [phone] Microbiology: [phone] Pathology: [phone] Radiology: [phone]  General questions: [phone] Pending tests: Laboratory: [phone] Microbiology: [phone] Pathology: [phone] Radiology: [phone]  General questions:  [phone] Pending tests:  Laboratory: [phone] Microbiology: [phone] Pathology: [phone] Radiology: [phone]  General questions:  [phone] Pending tests: Laboratory: [phone] Microbiology: [phone] Pathology: [phone] Radiology: [phone]  General questions:  [phone]    Effective  October 24, 2022, [facility], [facility], and/or [facility] refer to the [facility] campus of [facility].
